# Patient Record
Sex: Female | Born: 1940 | Race: White | Hispanic: No | Smoking: Never smoker
Health system: Southern US, Community
[De-identification: ages and names within clinical notes are randomized; demographics above are authoritative.]

## PROBLEM LIST (undated history)

## (undated) DIAGNOSIS — K219 Gastro-esophageal reflux disease without esophagitis: Secondary | ICD-10-CM

## (undated) DIAGNOSIS — C801 Malignant (primary) neoplasm, unspecified: Secondary | ICD-10-CM

## (undated) DIAGNOSIS — Z7901 Long term (current) use of anticoagulants: Secondary | ICD-10-CM

## (undated) DIAGNOSIS — F419 Anxiety disorder, unspecified: Secondary | ICD-10-CM

## (undated) DIAGNOSIS — M8580 Other specified disorders of bone density and structure, unspecified site: Secondary | ICD-10-CM

## (undated) DIAGNOSIS — E785 Hyperlipidemia, unspecified: Secondary | ICD-10-CM

## (undated) DIAGNOSIS — I480 Paroxysmal atrial fibrillation: Secondary | ICD-10-CM

## (undated) DIAGNOSIS — I10 Essential (primary) hypertension: Secondary | ICD-10-CM

## (undated) DIAGNOSIS — C50919 Malignant neoplasm of unspecified site of unspecified female breast: Secondary | ICD-10-CM

## (undated) DIAGNOSIS — C50911 Malignant neoplasm of unspecified site of right female breast: Secondary | ICD-10-CM

## (undated) HISTORY — DX: Malignant neoplasm of unspecified site of right female breast: C50.911

## (undated) HISTORY — DX: Paroxysmal atrial fibrillation: I48.0

## (undated) HISTORY — PX: CHOLECYSTECTOMY: SHX55

## (undated) HISTORY — DX: Essential (primary) hypertension: I10

## (undated) HISTORY — DX: Hyperlipidemia, unspecified: E78.5

## (undated) HISTORY — DX: Other specified disorders of bone density and structure, unspecified site: M85.80

## (undated) HISTORY — DX: Long term (current) use of anticoagulants: Z79.01

## (undated) HISTORY — DX: Malignant neoplasm of unspecified site of unspecified female breast: C50.919

## (undated) HISTORY — PX: ABDOMINAL HYSTERECTOMY: SHX81

---

## 2000-11-26 ENCOUNTER — Encounter: Payer: Self-pay | Admitting: Family Medicine

## 2000-11-26 ENCOUNTER — Ambulatory Visit (HOSPITAL_COMMUNITY): Admission: RE | Admit: 2000-11-26 | Discharge: 2000-11-26 | Payer: Self-pay | Admitting: Family Medicine

## 2001-12-02 ENCOUNTER — Ambulatory Visit (HOSPITAL_COMMUNITY): Admission: RE | Admit: 2001-12-02 | Discharge: 2001-12-02 | Payer: Self-pay | Admitting: *Deleted

## 2001-12-02 ENCOUNTER — Encounter: Payer: Self-pay | Admitting: *Deleted

## 2001-12-03 ENCOUNTER — Encounter: Payer: Self-pay | Admitting: *Deleted

## 2002-03-10 ENCOUNTER — Ambulatory Visit (HOSPITAL_COMMUNITY): Admission: RE | Admit: 2002-03-10 | Discharge: 2002-03-10 | Payer: Self-pay | Admitting: Internal Medicine

## 2002-03-10 HISTORY — PX: ESOPHAGOGASTRODUODENOSCOPY: SHX1529

## 2002-03-10 HISTORY — PX: COLONOSCOPY: SHX174

## 2002-03-14 ENCOUNTER — Encounter: Payer: Self-pay | Admitting: Internal Medicine

## 2002-03-14 ENCOUNTER — Ambulatory Visit (HOSPITAL_COMMUNITY): Admission: RE | Admit: 2002-03-14 | Discharge: 2002-03-14 | Payer: Self-pay | Admitting: Internal Medicine

## 2002-08-13 ENCOUNTER — Encounter: Payer: Self-pay | Admitting: Family Medicine

## 2002-08-13 ENCOUNTER — Ambulatory Visit (HOSPITAL_COMMUNITY): Admission: RE | Admit: 2002-08-13 | Discharge: 2002-08-13 | Payer: Self-pay | Admitting: Family Medicine

## 2003-02-02 ENCOUNTER — Encounter: Payer: Self-pay | Admitting: Family Medicine

## 2003-02-02 ENCOUNTER — Ambulatory Visit (HOSPITAL_COMMUNITY): Admission: RE | Admit: 2003-02-02 | Discharge: 2003-02-02 | Payer: Self-pay | Admitting: Family Medicine

## 2004-02-05 ENCOUNTER — Ambulatory Visit (HOSPITAL_COMMUNITY): Admission: RE | Admit: 2004-02-05 | Discharge: 2004-02-05 | Payer: Self-pay | Admitting: Family Medicine

## 2005-06-08 ENCOUNTER — Emergency Department (HOSPITAL_COMMUNITY): Admission: EM | Admit: 2005-06-08 | Discharge: 2005-06-08 | Payer: Self-pay | Admitting: Emergency Medicine

## 2005-08-12 ENCOUNTER — Inpatient Hospital Stay (HOSPITAL_COMMUNITY): Admission: EM | Admit: 2005-08-12 | Discharge: 2005-08-14 | Payer: Self-pay | Admitting: Emergency Medicine

## 2005-11-26 ENCOUNTER — Ambulatory Visit (HOSPITAL_COMMUNITY): Admission: RE | Admit: 2005-11-26 | Discharge: 2005-11-26 | Payer: Self-pay | Admitting: Family Medicine

## 2006-11-30 ENCOUNTER — Ambulatory Visit (HOSPITAL_COMMUNITY): Admission: RE | Admit: 2006-11-30 | Discharge: 2006-11-30 | Payer: Self-pay | Admitting: Family Medicine

## 2007-04-15 ENCOUNTER — Emergency Department (HOSPITAL_COMMUNITY): Admission: EM | Admit: 2007-04-15 | Discharge: 2007-04-16 | Payer: Self-pay | Admitting: Emergency Medicine

## 2007-12-20 ENCOUNTER — Ambulatory Visit (HOSPITAL_COMMUNITY): Admission: RE | Admit: 2007-12-20 | Discharge: 2007-12-20 | Payer: Self-pay | Admitting: Family Medicine

## 2008-03-17 ENCOUNTER — Emergency Department (HOSPITAL_COMMUNITY): Admission: EM | Admit: 2008-03-17 | Discharge: 2008-03-17 | Payer: Self-pay | Admitting: Emergency Medicine

## 2008-12-18 ENCOUNTER — Ambulatory Visit (HOSPITAL_COMMUNITY): Admission: RE | Admit: 2008-12-18 | Discharge: 2008-12-18 | Payer: Self-pay | Admitting: Family Medicine

## 2008-12-20 ENCOUNTER — Ambulatory Visit (HOSPITAL_COMMUNITY): Admission: RE | Admit: 2008-12-20 | Discharge: 2008-12-20 | Payer: Self-pay | Admitting: Family Medicine

## 2009-02-26 ENCOUNTER — Encounter (INDEPENDENT_AMBULATORY_CARE_PROVIDER_SITE_OTHER): Payer: Self-pay | Admitting: *Deleted

## 2009-02-26 LAB — CONVERTED CEMR LAB
ALT: 23 units/L
AST: 25 units/L
Alkaline Phosphatase: 72 units/L
BUN: 14 mg/dL
CO2: 25 meq/L
Chloride: 105 meq/L
Cholesterol: 178 mg/dL
Creatinine, Ser: 0.77 mg/dL
Glucose, Bld: 90 mg/dL
HCT: 44.1 %
HDL: 57 mg/dL
Potassium: 4.6 meq/L
Sodium: 142 meq/L

## 2009-03-15 ENCOUNTER — Emergency Department (HOSPITAL_COMMUNITY): Admission: EM | Admit: 2009-03-15 | Discharge: 2009-03-15 | Payer: Self-pay | Admitting: Emergency Medicine

## 2009-08-07 ENCOUNTER — Encounter (INDEPENDENT_AMBULATORY_CARE_PROVIDER_SITE_OTHER): Payer: Self-pay | Admitting: *Deleted

## 2009-08-08 ENCOUNTER — Ambulatory Visit: Payer: Self-pay | Admitting: Cardiology

## 2009-08-15 ENCOUNTER — Ambulatory Visit: Payer: Self-pay | Admitting: Cardiology

## 2009-08-24 ENCOUNTER — Encounter: Payer: Self-pay | Admitting: Cardiology

## 2009-09-04 ENCOUNTER — Encounter: Payer: Self-pay | Admitting: Cardiology

## 2009-09-17 ENCOUNTER — Ambulatory Visit: Payer: Self-pay | Admitting: Cardiology

## 2009-10-24 ENCOUNTER — Encounter (INDEPENDENT_AMBULATORY_CARE_PROVIDER_SITE_OTHER): Payer: Self-pay

## 2009-10-24 ENCOUNTER — Ambulatory Visit: Payer: Self-pay | Admitting: Cardiology

## 2009-10-24 ENCOUNTER — Inpatient Hospital Stay (HOSPITAL_COMMUNITY): Admission: EM | Admit: 2009-10-24 | Discharge: 2009-10-26 | Payer: Self-pay | Admitting: Emergency Medicine

## 2009-10-24 LAB — CONVERTED CEMR LAB
BUN: 11 mg/dL
CO2: 24 meq/L
Chloride: 107 meq/L
Glomerular Filtration Rate, Af Am: >60 mL/min/{1.73_m2}
Glucose, Bld: 138 mg/dL
Hemoglobin: 14.2 g/dL
Platelets: 253 10*3/uL

## 2009-11-08 ENCOUNTER — Ambulatory Visit: Payer: Self-pay | Admitting: Cardiology

## 2009-11-08 ENCOUNTER — Encounter (HOSPITAL_COMMUNITY): Admission: RE | Admit: 2009-11-08 | Discharge: 2009-11-08 | Payer: Self-pay | Admitting: Cardiology

## 2009-11-19 ENCOUNTER — Encounter: Payer: Self-pay | Admitting: Adult Health

## 2009-11-19 ENCOUNTER — Ambulatory Visit: Payer: Self-pay | Admitting: Cardiology

## 2009-12-21 ENCOUNTER — Ambulatory Visit (HOSPITAL_COMMUNITY): Admission: RE | Admit: 2009-12-21 | Discharge: 2009-12-21 | Payer: Self-pay | Admitting: Family Medicine

## 2010-01-09 ENCOUNTER — Telehealth (INDEPENDENT_AMBULATORY_CARE_PROVIDER_SITE_OTHER): Payer: Self-pay | Admitting: *Deleted

## 2010-01-11 ENCOUNTER — Ambulatory Visit: Payer: Self-pay | Admitting: Cardiology

## 2010-01-28 ENCOUNTER — Encounter (INDEPENDENT_AMBULATORY_CARE_PROVIDER_SITE_OTHER): Payer: Self-pay | Admitting: *Deleted

## 2010-01-28 LAB — CONVERTED CEMR LAB
Albumin: 4.4 g/dL
Alkaline Phosphatase: 58 units/L
CO2: 26 meq/L
Calcium: 9.3 mg/dL
Cholesterol: 150 mg/dL
Glucose, Bld: 87 mg/dL
HDL: 63 mg/dL
LDL Cholesterol: 60 mg/dL
Triglycerides: 135 mg/dL

## 2010-01-29 ENCOUNTER — Encounter (INDEPENDENT_AMBULATORY_CARE_PROVIDER_SITE_OTHER): Payer: Self-pay | Admitting: *Deleted

## 2010-01-29 LAB — CONVERTED CEMR LAB
ALT: 20 units/L
AST: 23 units/L
Albumin: 4.4 g/dL
BUN: 13 mg/dL
CO2: 26 meq/L
Calcium: 9.3 mg/dL
Chloride: 107 meq/L
Creatinine, Ser: 0.91 mg/dL
LDL Cholesterol: 60 mg/dL
Potassium: 4.6 meq/L
Sodium: 143 meq/L
Triglycerides: 135 mg/dL

## 2010-02-07 ENCOUNTER — Encounter: Payer: Self-pay | Admitting: Cardiology

## 2010-02-15 ENCOUNTER — Encounter (INDEPENDENT_AMBULATORY_CARE_PROVIDER_SITE_OTHER): Payer: Self-pay | Admitting: *Deleted

## 2010-02-26 ENCOUNTER — Ambulatory Visit: Payer: Self-pay | Admitting: Cardiology

## 2010-02-26 ENCOUNTER — Encounter (INDEPENDENT_AMBULATORY_CARE_PROVIDER_SITE_OTHER): Payer: Self-pay | Admitting: *Deleted

## 2010-02-26 DIAGNOSIS — E785 Hyperlipidemia, unspecified: Secondary | ICD-10-CM | POA: Insufficient documentation

## 2010-03-05 ENCOUNTER — Encounter: Payer: Self-pay | Admitting: Cardiology

## 2010-03-06 ENCOUNTER — Ambulatory Visit: Payer: Self-pay | Admitting: Cardiology

## 2010-03-06 LAB — CONVERTED CEMR LAB: POC INR: 5.6

## 2010-03-11 ENCOUNTER — Ambulatory Visit: Payer: Self-pay | Admitting: Cardiology

## 2010-03-11 LAB — CONVERTED CEMR LAB: POC INR: 3.7

## 2010-03-18 ENCOUNTER — Ambulatory Visit: Payer: Self-pay | Admitting: Cardiology

## 2010-03-27 ENCOUNTER — Ambulatory Visit: Payer: Self-pay | Admitting: Cardiology

## 2010-03-27 LAB — CONVERTED CEMR LAB: POC INR: 2.7

## 2010-04-11 ENCOUNTER — Ambulatory Visit: Payer: Self-pay | Admitting: Cardiology

## 2010-04-11 LAB — CONVERTED CEMR LAB: POC INR: 3.1

## 2010-04-30 ENCOUNTER — Encounter (INDEPENDENT_AMBULATORY_CARE_PROVIDER_SITE_OTHER): Payer: Self-pay | Admitting: *Deleted

## 2010-04-30 ENCOUNTER — Encounter: Payer: Self-pay | Admitting: Cardiology

## 2010-04-30 ENCOUNTER — Ambulatory Visit
Admission: RE | Admit: 2010-04-30 | Discharge: 2010-04-30 | Payer: Self-pay | Source: Home / Self Care | Attending: Cardiology | Admitting: Cardiology

## 2010-04-30 LAB — CONVERTED CEMR LAB
ALT: 20 units/L (ref 0–35)
AST: 24 units/L (ref 0–37)
Albumin: 4.3 g/dL (ref 3.5–5.2)
Alkaline Phosphatase: 66 units/L (ref 39–117)
Basophils Relative: 1 % (ref 0–1)
CO2: 26 meq/L (ref 19–32)
Creatinine, Ser: 0.85 mg/dL (ref 0.40–1.20)
Glucose, Bld: 107 mg/dL — ABNORMAL HIGH (ref 70–99)
Lymphocytes Relative: 47 % — ABNORMAL HIGH (ref 12–46)
MCHC: 31.3 g/dL (ref 30.0–36.0)
Neutrophils Relative %: 39 % — ABNORMAL LOW (ref 43–77)
OCCULT 1: NEGATIVE
OCCULT 2: NEGATIVE
OCCULT 3: NEGATIVE
POC INR: 3.4
RBC: 4.68 M/uL (ref 3.87–5.11)
Sodium: 139 meq/L (ref 135–145)
Total Protein: 7 g/dL (ref 6.0–8.3)
WBC: 7.8 10*3/uL (ref 4.0–10.5)

## 2010-05-08 LAB — CONVERTED CEMR LAB: OCCULT 3: NEGATIVE

## 2010-05-13 ENCOUNTER — Ambulatory Visit
Admission: RE | Admit: 2010-05-13 | Discharge: 2010-05-13 | Payer: Self-pay | Source: Home / Self Care | Attending: Cardiology | Admitting: Cardiology

## 2010-05-13 LAB — CONVERTED CEMR LAB: POC INR: 2.7

## 2010-05-17 ENCOUNTER — Encounter (INDEPENDENT_AMBULATORY_CARE_PROVIDER_SITE_OTHER): Payer: Self-pay | Admitting: *Deleted

## 2010-05-18 ENCOUNTER — Encounter: Payer: Self-pay | Admitting: Family Medicine

## 2010-05-24 ENCOUNTER — Telehealth (INDEPENDENT_AMBULATORY_CARE_PROVIDER_SITE_OTHER): Payer: Self-pay | Admitting: *Deleted

## 2010-05-28 NOTE — Letter (Signed)
Summary: rhythm strips dr Sudie Bailey  rhythm strips dr Sudie Bailey   Imported By: Faythe Ghee 08/08/2009 15:11:18  _____________________________________________________________________  External Attachment:    Type:   Image     Comment:   External Document

## 2010-05-28 NOTE — Assessment & Plan Note (Signed)
Summary: **PER DR.KNOWLTON FOR NEW ONSET A-FIB/TG   Visit Type:  Initial Consult Primary Provider:  Dr.Stephen Sudie Bailey   History of Present Illness: It was my pleasure evaluating [name] , a pleasant 70 year old woman with a history of paroxysmal atrial fibrillation.  Patient originally presented approximately 2 years ago with AF and a rapid ventricular response.  She was hospitalized and initially treated by [facility].  Records of those encounters have been requested.  She apparently was found not to have structural heart disease that was not thought to need anticoagulation.  She subsequently did well until seen by Dr. Sudie Bailey in the office a few days ago at which time atrial fibrillation was once again present.  Patient is somewhat unclear as to whether she was symptomatic.  She believes she may have had some dyspnea that morning and some malaise.  She did not have palpitations or chest discomfort.  Office records obtained from Dr. Sudie Bailey and reviewed.  A CBC, chemistry profile and lipid profile were excellent 4 months ago.  Current Medications (verified): 1)  Fexofenadine Hcl 180 Mg Tabs (Fexofenadine Hcl) .... Take 1 Tab Daily 2)  Losartan Potassium 50 Mg Tabs (Losartan Potassium) .... Take 1 Tab Daily 3)  Atenolol 100 Mg Tabs (Atenolol) .... Take 1 Tab Daily 4)  Premarin 0.3 Mg Tabs (Estrogens Conjugated) .... Take 1 Tab Three Times Weekly 5)  Aspir-Low 81 Mg Tbec (Aspirin) .... Take 1 Tab Three Times Weekly 6)  Prilosec 40 Mg Cpdr (Omeprazole) .... Take 1 Tab Daily 7)  Simvastatin 40 Mg Tabs (Simvastatin) .... Take 1 Tab Daily  Allergies (verified): 1)  ! * Sulfar 2)  ! * Iv Dye  Past History:  Family History: Last updated: Sep 02, 2009 Father died at age 12 as the result of an acute myocardial infarction Mother died at age 19, also with cardiac problems. 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   Review of Systems       Notable for intermittent headaches, the need for corrective lenses, partial upper and lower dentures, gastroesophageal reflux disease symptoms, urinary frequency.  All other systems reviewed and are negative.  Vital Signs:  Patient profile:   70 year old female Height:      61 inches Weight:      152 pounds BMI:     28.82 Pulse rate:   61 / minute BP sitting:   147 / 83  (right arm)  Vitals Entered By: Dreama Saa, CNA (02-Sep-2009 10:59 AM)  Physical Exam  General:  Well-developed; Somewhat overweight; no acute distress: HEENT-Decker/AT;  PERRL; EOM intact; conjunctiva and lids nl:  Neck-No JVD; no carotid bruits: Endocrine-No thyromegaly: Lungs-No tachypnea, clear without rales, rhonchi or wheezes: CV-normal PMI; normal S1 and S2; prominent S4 Abdomen-BS normal; soft and non-tender without masses or organomegaly: MS-No deformities, cyanosis or clubbing: Neurologic-Nl cranial nerves; symmetric strength and tone: Skin- Warm,  no sig. lesions: Extremities-Nl distal pulses; no edema    Impression & Recommendations:  Problem # 1:  FIBRILLATION, ATRIAL (ICD-427.31) From a single episode of recurrent atrial fibrillation of uncertain duration, it is not possible to assess the prevalence of her arrhythmia nor whether it is resulting in symptoms.  Accordingly, she will carry an event recorder for the next 3 weeks to permit identification of the frequency of episodes, their duration and hopefully correlate her sense of well-being with these spells.  [name] risk for thromboembolism is intermediate based upon her age, gender and the presence of mild well-controlled hypertension.  Most likely, she will require anticoagulation, but this decision can be better made with additional data is available.  I will reassess this nice woman in one month.  Patient Instructions: 1)  Your physician recommends that you schedule a follow-up appointment in: 1 month 2)  Your physician has recommended that you wear an event monitor.  Event monitors are medical devices that record the heart's electrical activity. Doctors most often use these monitors to diagnose arrhythmias. Arrhythmias are problems with the speed or rhythm of the heartbeat. The monitor is a small, portable device. You can wear one while you do your normal daily activities. This is usually used to diagnose what is causing palpitations/syncope (passing out).

## 2010-05-28 NOTE — Letter (Signed)
Summary: [facility] [employer], [employer] at [employer]  [address] [place] [address]   Phone: [phone]  Fax: [phone]     February 26, 2010 MRN: [id number]   Stacie Huang [address] , [place]  [address]      YOUR LAB WORK IS DUE   April 29, 2010  Please go to [facility], located across the street from [facility] on the second floor.  Hours are Monday - Friday 7am until 7:30pm         Saturday 8am until 12noon    __  DO NOT EAT OR DRINK AFTER MIDNIGHT EVENING PRIOR TO LABWORK  _X_ YOUR LABWORK IS NOT FASTING --YOU MAY EAT PRIOR TO LABWORK

## 2010-05-28 NOTE — Progress Notes (Signed)
Summary: Stacie Huang SOB AND WEAK  Phone Note Call from Patient Call back at Home Phone [phone]   Caller: Stacie Huang Reason for Call: Talk to Nurse Summary of Call: Stacie Huang WAS PUT ON NEW RX DILTIAZEM AND SHE THINKS IT MAY BE CAUSING HER TO BE SOB AND HAS NOT FELT GOOD FOR ABOUT TWO  MONTHS KEEPS GETTING WORSE. SHE IS WEAK ALL THE TIME. Initial call taken by: Faythe Ghee,  January 09, 2010 9:41 AM  Follow-up for Phone Call        [facility] Follow-up by: Teressa Lower RN,  January 09, 2010 1:58 PM    Additional Follow-up for Phone Call Additional follow up Details #2::    Stacie Huang returned call, feeling tired, listless, on diltiazem for atrial fib, asked Stacie Huang to schedule a nurse visit for further eval and ekg on Friday 01/14/2010 Follow-up by: Teressa Lower RN,  January 09, 2010 2:06 PM

## 2010-05-28 NOTE — Letter (Signed)
Summary: progress note dr Sudie Bailey  progress note dr Sudie Bailey   Imported By: Faythe Ghee 08/08/2009 15:10:52  _____________________________________________________________________  External Attachment:    Type:   Image     Comment:   External Document

## 2010-05-28 NOTE — Miscellaneous (Signed)
Summary: cmp,lipids per Dr. Sudie Bailey  Clinical Lists Changes  Observations: Added new observation of CALCIUM: 9.3 mg/dL (16/01/9603 54:09) Added new observation of ALBUMIN: 4.4 g/dL (81/19/1478 29:56) Added new observation of PROTEIN, TOT: 6.9 g/dL (21/30/8657 84:69) Added new observation of SGPT (ALT): 20 units/L (01/29/2010 11:55) Added new observation of SGOT (AST): 23 units/L (01/29/2010 11:55) Added new observation of ALK PHOS: 58 units/L (01/29/2010 11:55) Added new observation of BILI DIRECT: total bili    0.9 mg/dL (62/95/2841 32:44) Added new observation of CREATININE: 0.91 mg/dL (04/30/7251 66:44) Added new observation of BUN: 13 mg/dL (03/47/4259 56:38) Added new observation of BG RANDOM: 87 mg/dL (75/64/3329 51:88) Added new observation of CO2 PLSM/SER: 26 meq/L (01/29/2010 11:55) Added new observation of CL SERUM: 107 meq/L (01/29/2010 11:55) Added new observation of K SERUM: 4.6 meq/L (01/29/2010 11:55) Added new observation of NA: 143 meq/L (01/29/2010 11:55) Added new observation of LDL: 60 mg/dL (41/66/0630 16:01) Added new observation of HDL: 63 mg/dL (09/32/3557 32:20) Added new observation of TRIGLYC TOT: 135 mg/dL (25/42/7062 37:62) Added new observation of CHOLESTEROL: 150 mg/dL (83/15/1761 60:73)

## 2010-05-28 NOTE — Medication Information (Signed)
Summary: ccr-lr  Anticoagulant Therapy  Managed by: Vashti Hey, RN PCP: Dr.Stephen Orvan Falconer MD: Dietrich Pates MD, Molly Maduro Indication 1: Atrial Fibrillation Lab Used: [facility] Point of Care [place] Site: [place] INR POC 3.7  Dietary changes: no    Health status changes: no    Bleeding/hemorrhagic complications: no    Recent/future hospitalizations: no    Any changes in medication regimen? no    Recent/future dental: no  Any missed doses?: no       Is patient compliant with meds? yes       Allergies: 1)  ! * Sulfar 2)  ! * Iv Dye  Anticoagulation Management History:      The patient is taking warfarin and comes in today for a routine follow up visit.  Warfarin therapy is being given due to Atrial Fibrillation .  Positive risk factors for bleeding include an age of 71 years or older.  Negative risk factors for bleeding include no history of CVA/TIA, no history of GI bleeding, and absence of serious comorbidities.  The bleeding index is 'intermediate risk'.  Positive CHADS2 values include History of HTN.  Negative CHADS2 values include History of CHF, Age > 71 years old, History of Diabetes, and Prior Stroke/CVA/TIA.  The start date was 02/26/2010.  Anticoagulation responsible Shanta Hartner: Dietrich Pates MD, Molly Maduro.  INR POC: 3.7.  Cuvette Lot#: [id number].    Anticoagulation Management Assessment/Plan:      The patient's current anticoagulation dose is Warfarin sodium 5 mg tabs: take 1 tablet on MWFand Saturday and 1/2 tablet all other days.  The target INR is 2.0-3.0.  The next INR is due 03/18/2010.  Anticoagulation instructions were given to patient.  Results were reviewed/authorized by Vashti Hey, RN.  She was notified by Vashti Hey RN.         Prior Anticoagulation Instructions: INR 5.6 Hold couamdin tonight and tomorrow then decrease dose to 2.5mg  once daily   Current Anticoagulation Instructions: INR 3.7 Decrease coumadin dose to 1/2 tablet once daily except none on  Mondays and Thursdays

## 2010-05-28 NOTE — Miscellaneous (Signed)
**Note De-Identified Stacie Huang Obfuscation** Summary: CBC, CMP and cardiac markers  Clinical Lists Changes  Observations: Added new observation of TROPONIN I: 0.02 ng/mL (10/24/2009 13:58) Added new observation of CK-MB ISOENZ: 1.1 ng/mL (10/24/2009 13:58) Added new observation of CALCIUM: 9.3 mg/dL (95/63/8756 43:32) Added new observation of ALBUMIN: 4.2 g/dL (95/18/8416 60:63) Added new observation of PROTEIN, TOT: 7.5 g/dL (01/60/1093 23:55) Added new observation of SGPT (ALT): 41 units/L (10/24/2009 13:58) Added new observation of SGOT (AST): 43 units/L (10/24/2009 13:58) Added new observation of ALK PHOS: 73 units/L (10/24/2009 13:58) Added new observation of BILI DIRECT: Bili Total: 0.6 mg/dL (73/22/0254 27:06) Added new observation of GFR AA: >60 mL/min/1.22m2 (10/24/2009 13:58) Added new observation of GFR: >60 mL/min (10/24/2009 13:58) Added new observation of CREATININE: 0.77 mg/dL (23/76/2831 51:76) Added new observation of BUN: 11 mg/dL (16/10/3708 62:69) Added new observation of BG RANDOM: 138 mg/dL (48/54/6270 35:00) Added new observation of CO2 PLSM/SER: 24 meq/L (10/24/2009 13:58) Added new observation of CL SERUM: 107 meq/L (10/24/2009 13:58) Added new observation of K SERUM: 3.5 meq/L (10/24/2009 13:58) Added new observation of NA: 139 meq/L (10/24/2009 13:58) Added new observation of PLATELETK/UL: 253 K/uL (10/24/2009 13:58) Added new observation of MCV: 92.2 fL (10/24/2009 13:58) Added new observation of HCT: 42.9 % (10/24/2009 13:58) Added new observation of HGB: 14.2 g/dL (93/81/8299 37:16) Added new observation of WBC COUNT: 10.6 10*3/microliter (10/24/2009 13:58) Added new observation of INR POC: 0.96  (10/24/2009 13:58) Added new observation of PT PATIENT: 12.7 s (10/24/2009 13:58)

## 2010-05-28 NOTE — Letter (Signed)
Summary: progress note  dr Sudie Bailey  progress note  dr Sudie Bailey   Imported By: Faythe Ghee 08/24/2009 14:36:08  _____________________________________________________________________  External Attachment:    Type:   Image     Comment:   External Document

## 2010-05-28 NOTE — Miscellaneous (Signed)
Summary: labs cmp,lipids,01/28/2010  Clinical Lists Changes  Observations: Added new observation of CALCIUM: 9.3 mg/dL (16/01/9603 54:09) Added new observation of ALBUMIN: 4.4 g/dL (81/19/1478 29:56) Added new observation of PROTEIN, TOT: 6.9 g/dL (21/30/8657 84:69) Added new observation of SGPT (ALT): 20 units/L (01/28/2010 11:30) Added new observation of SGOT (AST): 23 units/L (01/28/2010 11:30) Added new observation of ALK PHOS: 58 units/L (01/28/2010 11:30) Added new observation of CREATININE: 0.91 mg/dL (62/95/2841 32:44) Added new observation of BUN: 13 mg/dL (04/30/7251 66:44) Added new observation of BG RANDOM: 87 mg/dL (03/47/4259 56:38) Added new observation of CO2 PLSM/SER: 26 meq/L (01/28/2010 11:30) Added new observation of CL SERUM: 107 meq/L (01/28/2010 11:30) Added new observation of K SERUM: 4.6 meq/L (01/28/2010 11:30) Added new observation of NA: 143 meq/L (01/28/2010 11:30) Added new observation of LDL: 60 mg/dL (75/64/3329 51:88) Added new observation of HDL: 63 mg/dL (41/66/0630 16:01) Added new observation of TRIGLYC TOT: 135 mg/dL (09/32/3557 32:20) Added new observation of CHOLESTEROL: 150 mg/dL (25/42/7062 37:62)

## 2010-05-28 NOTE — Letter (Signed)
Summary: DR Sudie Bailey OFFICE NOTE 01/28/10  DR Sudie Bailey OFFICE NOTE 01/28/10   Imported By: Faythe Ghee 02/07/2010 14:04:42  _____________________________________________________________________  External Attachment:    Type:   Image     Comment:   External Document

## 2010-05-28 NOTE — Miscellaneous (Signed)
Summary: LABS CBCD,CMP,LIPID,02/26/2009  Clinical Lists Changes  Observations: Added new observation of CALCIUM: 9.6 mg/dL (81/19/1478 29:56) Added new observation of ALBUMIN: 4.3 g/dL (21/30/8657 84:69) Added new observation of PROTEIN, TOT: 7.0 g/dL (62/95/2841 32:44) Added new observation of SGPT (ALT): 23 units/L (02/26/2009 10:22) Added new observation of SGOT (AST): 25 units/L (02/26/2009 10:22) Added new observation of ALK PHOS: 72 units/L (02/26/2009 10:22) Added new observation of CREATININE: 0.77 mg/dL (04/30/7251 66:44) Added new observation of BUN: 14 mg/dL (03/47/4259 56:38) Added new observation of BG RANDOM: 90 mg/dL (75/64/3329 51:88) Added new observation of CO2 PLSM/SER: 25 meq/L (02/26/2009 10:22) Added new observation of CL SERUM: 105 meq/L (02/26/2009 10:22) Added new observation of K SERUM: 4.6 meq/L (02/26/2009 10:22) Added new observation of NA: 142 meq/L (02/26/2009 10:22) Added new observation of LDL: 89 mg/dL (41/66/0630 16:01) Added new observation of HDL: 57 mg/dL (09/32/3557 32:20) Added new observation of TRIGLYC TOT: 161 mg/dL (25/42/7062 37:62) Added new observation of CHOLESTEROL: 178 mg/dL (83/15/1761 60:73) Added new observation of PLATELETK/UL: 284 K/uL (02/26/2009 10:22) Added new observation of MCV: 92.1 fL (02/26/2009 10:22) Added new observation of HCT: 44.1 % (02/26/2009 10:22) Added new observation of HGB: 13.6 g/dL (71/09/2692 85:46) Added new observation of WBC COUNT: 8.1 10*3/microliter (02/26/2009 10:22)

## 2010-05-28 NOTE — Assessment & Plan Note (Signed)
Summary: F1M   Visit Type:  Follow-up Primary Provider:  Dr.Stephen Sudie Bailey   History of Present Illness: [name] returns to the office as scheduled for continued assessment and treatment of paroxysmal atrial fibrillation.  In retrospect, she believes that she was able to sense mild malaise in association with her arrhythmia.  She has had no similar symptoms over the past month.  An event recorder verified that no atrial arrhythmias occurred.  Event Monitor  Procedure date:  08/26/2009  Findings:      Normal sinus rhythm with sinus bradycardia and PVCs No symptoms reported Note atrial fibrillation identified   Current Medications (verified): 1)  Fexofenadine Hcl 180 Mg Tabs (Fexofenadine Hcl) .... Take 1 Tab Daily 2)  Losartan Potassium 50 Mg Tabs (Losartan Potassium) .... Take 1 Tab Daily 3)  Atenolol 100 Mg Tabs (Atenolol) .... Take 1 Tab Daily 4)  Premarin 0.3 Mg Tabs (Estrogens Conjugated) .... Take 1 Tab Three Times Weekly 5)  Aspir-Low 81 Mg Tbec (Aspirin) .... Take 1 Tab Three Times Weekly 6)  Prilosec 40 Mg Cpdr (Omeprazole) .... Take 1 Tab Daily 7)  Simvastatin 40 Mg Tabs (Simvastatin) .... Take 1 Tab Daily  Allergies (verified): 1)  ! * Sulfar 2)  ! * Iv Dye  Past History:  PMH, FH, and Social History reviewed and updated.  Review of Systems       See history of present illness.  Vital Signs:  Patient profile:   70 year old female Weight:      154 pounds Pulse rate:   57 / minute BP sitting:   132 / 63  (right arm)  Vitals Entered By: Dreama Saa, CNA (Sep 17, 2009 1:05 PM)  Physical Exam  General:  Well-developed; Somewhat overweight; no acute distress: Neck-No JVD; no carotid bruits: Lungs-No tachypnea, clear without rales, rhonchi or wheezes: CV-normal PMI; normal S1 and S2;  S4 and modest systolic murmur present Abdomen-BS normal; soft and non-tender without masses or organomegaly: MS-No deformities, cyanosis or clubbing: Neurologic-Nl  cranial nerves; symmetric strength and tone: Skin- Warm, no sig. lesions: Extremities-Nl distal pulses; no edema    Impression & Recommendations:  Problem # 1:  FIBRILLATION, ATRIAL (ICD-427.31) In light of intermediate age, female gender and mild well-controlled hypertension, risk of thromboembolism is low to moderate.  Since only one fairly brief spell of atrial fibrillation has been diagnosed in recent months, I believe that the risks of anticoagulation would exceed the benefit at the present time.  As Ms. Teed ages, frequency of atrial fibrillation may well increase, which would justify full anticoagulation.  I will continue to follow this nice woman and will plan to see her again in the office in 7 months.  Problem # 2:  POSTMENOPAUSAL ON HORMONE REPLACEMENT THERAPY (ICD-V07.4) Patient continues to use estrogen, but has substantially reduced the dosage.  Even with this supplement, she experiences significant hot flashes on occasion and does not wish to discontinue estrogen entirely.  Patient Instructions: 1)  Your physician recommends that you schedule a follow-up appointment in: 8 months 2)  Your physician recommends that you continue on your current medications as directed. Please refer to the Current Medication list given to you today.

## 2010-05-28 NOTE — Assessment & Plan Note (Signed)
Summary: POST HOSP [place] PER 300/TG   Visit Type:  Follow-up Primary Provider:  Dr.Stephen Sudie Bailey  CC:  no cardiology complaints.  History of Present Illness: [name] is a pleasant 70 y/o CF with known history of PAF.  She was recently admitted to [facility] for AFib- RVR and placed on cardiazem gtt.  She converted to NSR and is now on Cardiazem 120mg  daily, along with atenolol.  She states she has been feeling welll without complaints of fatigue or DOE, she denies swelling or recurrence of palpatations.    Preventive Screening-Counseling & Management  Alcohol-Tobacco     Smoking Status: never  Current Medications (verified): 1)  Losartan Potassium 50 Mg Tabs (Losartan Potassium) .... Take 1 Tab Daily 2)  Atenolol 100 Mg Tabs (Atenolol) .... Take 1 Tab Daily 3)  Premarin 0.3 Mg Tabs (Estrogens Conjugated) .... Take 1 Tab Three Times Weekly 4)  Aspir-Low 81 Mg Tbec (Aspirin) .... Take 1 Tab Three Times Weekly 5)  Prilosec 40 Mg Cpdr (Omeprazole) .... Take 1 Tab Daily 6)  Simvastatin 40 Mg Tabs (Simvastatin) .... Take 1 Tab Daily 7)  Diltiazem Hcl Er Beads 120 Mg Xr24h-Cap (Diltiazem Hcl Er Beads) .... Take 1 Tab Daily 8)  Clobetasol Propionate 0.05 % Crea (Clobetasol Propionate) .... Use As Needed  Allergies (verified): 1)  ! * Sulfar 2)  ! * Iv Dye  Past History:  Past medical, surgical, family and social histories (including risk factors) reviewed, and no changes noted (except as noted below).  Past Medical History: Reviewed history from 08/08/2009 and no changes required. Paroxysmal atrial fibrillation Hypertension  Past Surgical History: Reviewed history from 08/08/2009 and no changes required. Hysterectomy Cholecystectomy  Family History: Reviewed history from 08/08/2009 and no changes required. Father died at age 10 as the result of an acute myocardial infarction Mother died at age 69, also with cardiac problems. Siblings: Of 9, 5 have or had heart disease and  2 have died due to neoplastic disease  Social History: Reviewed history from 08/08/2009 and no changes required. 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   Vital Signs:  Patient profile:   70 year old female Weight:      157 pounds Pulse rate:   60 / minute BP sitting:   113 / 58  (right arm)  Vitals Entered By: Dreama Saa, CNA (November 19, 2009 2:04 PM)  Physical Exam  General:  Well developed, well nourished, in no acute distress. Lungs:  Clear bilaterally to auscultation and percussion. Heart:  Non-displaced PMI, chest non-tender; regular rate and rhythm, S1, S2 without murmurs, rubs or gallops. Carotid upstroke normal, no bruit. Normal abdominal aortic size, no bruits. Femorals normal pulses, no bruits. Pedals normal pulses. No edema, no varicosities. Abdomen:  Bowel sounds positive; abdomen soft and non-tender without masses, organomegaly, or hernias noted. No hepatosplenomegaly. Extremities:  No clubbing or cyanosis. Neurologic:  Alert and oriented x 3. Psych:  Normal affect.   EKG  Procedure date:  11/19/2009  Findings:      Sinus bradycardia with rate of:58bpm    Impression & Recommendations:  Problem # 1:  FIBRILLATION, ATRIAL (ICD-427.31) Currently remains in NSR-SB on medications listed.  She denies having any recurrance of palpations at this time.  She had a nuclear medicine stress myoview  dated 11/08/09 which demonstrated deconditoning with elevated HR, but no perfusion abnormalities.  I have advised her to begin an exercise program of walking to allow for better stamina and improve conditioning.  She verbalizes understanding and plans to do so.  We will see her again in 3 months. Her updated medication list for this problem includes:    Atenolol  100 Mg Tabs (Atenolol) .Marland Kitchen... Take 1 tab daily    Aspir-low 81 Mg Tbec (Aspirin) .Marland Kitchen... Take 1 tab three times weekly  Patient Instructions: 1)  Your physician recommends that you schedule a follow-up appointment in: 3 months 2)  Your physician recommends that you continue on your current medications as directed. Please refer to the Current Medication list given to you today.   Prevention & Chronic Care Immunizations   Influenza vaccine: Not documented    Tetanus booster: Not documented    Pneumococcal vaccine: Not documented    H. zoster vaccine: Not documented  Colorectal Screening   Hemoccult: Not documented    Colonoscopy: Not documented  Other Screening   Pap smear: Not documented    Mammogram: Not documented    DXA bone density scan: Not documented   Smoking status: never  (11/19/2009)  Lipids   Total Cholesterol: 178  (02/26/2009)   LDL: 89  (02/26/2009)   LDL Direct: Not documented   HDL: 57  (02/26/2009)   Triglycerides: 161  (02/26/2009)

## 2010-05-28 NOTE — Assessment & Plan Note (Signed)
**Note De-Identified Sean Malinowski Obfuscation** Summary: NURSE VISIT PER TAMMY/TG  Nurse Visit   Vital Signs:  Patient profile:   70 year old female Weight:      162 pounds O2 Sat:      96 % on Room air Pulse rate:   82 / minute BP sitting:   119 / 71  (left arm)  Vitals Entered By: [name] Mckenley Birenbaum LPN (January 11, 2010 9:22 AM)  O2 Flow:  Room air   Visit Type:  Nurse visit/EKG Primary Provider:  Dr.Stephen Sudie Bailey   History of Present Illness: S: Pt. called yesterday with c/o SOB, fatigue and weakness. B: Pt. was discharged from [facility] on 10-26-09 on Diltiazem 120mg  once daily. A: Pt. c/o SOB, fatigue and weakness since starting Diltiazem and has been getting worse over the past 3-4 weeks. BP=119/71 and EKG obtained at this visit. R: Will call pt. with Dr. Marvel Plan recommendations.   01/14/10  Patient is scheduled for reassessment on November 1.  If she is unable to take diltiazem until that time, she may discontinue.  Page Bing, M.D.   [facility].      [name] Jaramiah Bossard LPN  January 14, 2010 1:00 PM   Pt. advised. She states she will try to continue to take Diltiazem until Nov. 1 appt. with Dr. Dietrich Pates.   Current Medications (verified): 1)  Losartan Potassium 50 Mg Tabs (Losartan Potassium) .... Take 1 Tab Daily 2)  Atenolol 100 Mg Tabs (Atenolol) .... Take 1 Tab Daily 3)  Premarin 0.3 Mg Tabs (Estrogens Conjugated) .... Take 1 Tablet By Mouth Once Daily 4)  Aspir-Low 81 Mg Tbec (Aspirin) .... Take 1 Tab Three Times Weekly 5)  Prilosec 40 Mg Cpdr (Omeprazole) .... Take 1 Tab Daily 6)  Simvastatin 40 Mg Tabs (Simvastatin) .... Take 1 Tab Daily 7)  Diltiazem Hcl Er Beads 120 Mg Xr24h-Cap (Diltiazem Hcl Er Beads) .... Take 1 Tab Daily 8)  Clobetasol Propionate 0.05 % Crea (Clobetasol Propionate) .... Use As Needed 9)  Vitamin D 400 Unit Caps (Cholecalciferol) .Marland Kitchen.. 1 Tablet By Mouth Once Daily 10)  Calcium 600 1500 Mg Tabs (Calcium Carbonate) .... Take 1 Tablet By Mouth Once Daily  Allergies (verified): 1)  ! *  Sulfar 2)  ! * Iv Dye

## 2010-05-28 NOTE — Assessment & Plan Note (Signed)
Summary: 3 mth f/u per checkout on 11/19/09/tg   Primary Provider:  Dr.Stephen Sudie Bailey   History of Present Illness: [name] returns to the office for continued assessment and treatment of atrial fibrillation.  Although her arrhythmia reverted to sinus rhythm in hospital, recurrent AF has been documented during her office visits.  She remains asymptomatic.  She experienced increased fatigue after initial adjustment of her medical therapy, but this resolved after her dose of atenolol was halved.  She also may have had myalgias prompting a reduction in her dose of simvastatin.  Alternatively, this may have been done because she is receiving concomitant therapy with diltiazem.  At the present time, she feels fairly good with some dyspnea on exertion.  A recent stress test showed physical deconditioning without myocardial ischemia on nuclear imaging.  There was no oxygen desaturation or marked increase in heart rate at low level exercise.  Hospital records from [facility] were obtained and reviewed.  A carotid ultrasound study performed in 2003 revealed no significant vascular disease as was the case for a CT scan of the abdomen in 2009.   Current Medications (verified): 1)  Losartan Potassium 50 Mg Tabs (Losartan Potassium) .... Take 1 Tab Daily 2)  Atenolol 100 Mg Tabs (Atenolol) .... Take 1/2 Tab Daily 3)  Premarin 0.3 Mg Tabs (Estrogens Conjugated) .... Take 1 Tablet By Mouth Once Daily 4)  Prilosec 40 Mg Cpdr (Omeprazole) .... Take 1 Tab Daily 5)  Simvastatin 10 Mg Tabs (Simvastatin) .... Take 1 Tablet By Mouth At Bedtime 6)  Diltiazem Hcl Er Beads 120 Mg Xr24h-Cap (Diltiazem Hcl Er Beads) .... Take 1 Tablet By Mouth Once A Day 7)  Clobetasol Propionate 0.05 % Crea (Clobetasol Propionate) .... Use As Needed 8)  Vitamin D 400 Unit Caps (Cholecalciferol) .Marland Kitchen.. 1 Tablet By Mouth Once Daily 9)  Calcium 600 1500 Mg Tabs (Calcium Carbonate) .... Take 1 Tablet By Mouth Once Daily 10)  Warfarin  Sodium 5 Mg Tabs (Warfarin Sodium) .... Take 1 Tablet On Mwfand Saturday and 1/2 Tablet All Other Days  Allergies (verified): 1)  ! * Sulfar 2)  ! * Iv Dye  Past History:  PMH, FH, and Social History reviewed and updated.  Review of Systems  The patient denies weight loss, weight gain, hoarseness, chest pain, syncope, peripheral edema, prolonged cough, headaches, hemoptysis, abdominal pain, and melena.    Vital Signs:  Patient profile:   70 year old female Weight:      159 pounds O2 Sat:      97 % Pulse rate:   73 / minute BP sitting:   117 / 71  (left arm)  Vitals Entered By: Stacie Huang Via LPN (February 26, 2010 1:06 PM)  Physical Exam  General:  Well developed;no acute distress; mildly overweight Neck-No JVD; no carotid bruits: Lungs-No tachypnea, no rales; no rhonchi; no wheezes: Cardiovascular-irregular rhythm;normal PMI; normal S1 and S2: Abdomen-BS normal; soft and non-tender without masses or organomegaly:  Musculoskeletal-No deformities, no cyanosis or clubbing: Neurologic-Normal cranial nerves; symmetric strength and tone:  Skin-Warm, no significant lesions: Extremities-Nl distal pulses; no edema:     Impression & Recommendations:  Problem # 1:  FIBRILLATION, ATRIAL (ICD-427.31) Heart rate has been well controlled, both at this visit and at her stress test.  Since her arrhythmia is paroxysmal, there is no point in performing cardioversion.  No benefit has been shown to treatment with antiarrhythmic drugs.  Accordingly, we will continue her current medication.  Problem # 2:  ANTICOAGULATION (ICD-V58.61) Her Chads score is 2, but she is female and approaching age 75, both of which convey additional risk.  Accordingly, aspirin will be discontinued and warfarin started.  We will provide teaching regarding anticoagulation and followup in our anticoagulation clinic.  Baseline stool for Hemoccult testing will be obtained.  Patient reports a colonoscopy that was negative  approximately 6 years ago.  Problem # 3:  HYPERTENSION (ICD-401.1) Blood pressure control has been excellent, but is only marginal today; hypertension is likely mild.  Patient will collect additional measurements at home and return for reassessment.  Other Orders: Hemoccult Cards (Take Home) (Hemoccult Cards) Future Orders: T-CBC w/Diff ([id number]) ... 04/29/2010 T-Comprehensive Metabolic Panel [id number] [phone]) ... 04/29/2010  Patient Instructions: 1)  Your physician recommends that you schedule a follow-up appointment in: 2 months 2)  Your physician has recommended you make the following change in your medication: stop aspirin, warfarin 5mg  tablets take 1 tablet on MWF and Saturday and 1/2 tablet all other days,  3)  Your physician has asked that you test your stool for blood. It is necessary to test 3 different stool specimens for accuracy. You will be given 3 hemoccult cards for specimen collection. For each stool specimen, place a small portion of stool sample (from 2 different areas of the stool) into the 2 squares on the card. Close card. Repeat with 2 more stool specimens. Bring the cards back to the office for testing. Prescriptions: WARFARIN SODIUM 5 MG TABS (WARFARIN SODIUM) take 1 tablet on MWFand Saturday and 1/2 tablet all other days  #45 x 3   Entered by:   Teressa Lower RN   Authorized by:   Kathlen Brunswick, MD, [facility]   Signed by:   Teressa Lower RN on 02/26/2010   Method used:   Electronically to        [facility]* (retail)       [address]       [place], [place]  [address]       Ph: [phone]       Fax: [phone]   RxID:   [id number] DILTIAZEM HCL ER BEADS 120 MG XR24H-CAP (DILTIAZEM HCL ER BEADS) Take 1 tablet by mouth once a day  #30 x 6   Entered by:   Teressa Lower RN   Authorized by:   Kathlen Brunswick, MD, [facility]   Signed by:   Teressa Lower RN on 02/26/2010   Method used:   Electronically to        [facility]* (retail)       [address]       [place], [place]  [address]       Ph: [phone]       Fax: [phone]   RxID:   [id number]   Prevention & Chronic Care Immunizations   Influenza vaccine: Not documented    Tetanus booster: Not documented    Pneumococcal vaccine: Not documented    H. zoster vaccine: Not documented  Colorectal Screening   Hemoccult: Not documented    Colonoscopy: Not documented  Other Screening   Pap smear: Not documented    Mammogram: Not documented    DXA bone density scan: Not documented   Smoking status: never  (11/19/2009)  Lipids   Total Cholesterol: 150  (01/29/2010)   LDL: 60  (01/29/2010)   LDL Direct: Not documented   HDL: 63  (  01/29/2010)   Triglycerides: 135  (01/29/2010)    SGOT (AST): 23  (01/29/2010)   SGPT (ALT): 20  (01/29/2010) CMP ordered    Alkaline phosphatase: 58  (01/29/2010)   Total bilirubin: Not documented  Hypertension   Last Blood Pressure: 117 / 71  (02/26/2010)   Serum creatinine: 0.91  (01/29/2010)   Serum potassium 4.6  (01/29/2010) CMP ordered   Self-Management Support :    Hypertension self-management support: Not documented    Lipid self-management support: Not documented

## 2010-05-28 NOTE — Letter (Signed)
Summary: EKG 02-13-2009  EKG 02-13-2009   Imported By: Faythe Ghee 08/24/2009 14:37:26  _____________________________________________________________________  External Attachment:    Type:   Image     Comment:   External Document

## 2010-05-28 NOTE — Letter (Signed)
Summary: DR Sudie Bailey RHYTHM STRIP  DR Sudie Bailey RHYTHM STRIP   Imported By: Faythe Ghee 02/07/2010 14:05:47  _____________________________________________________________________  External Attachment:    Type:   Image     Comment:   External Document

## 2010-05-28 NOTE — Consult Note (Signed)
Summary: [facility] - Dr. Dietrich Pates  [facility]   Imported By: Roderic Ovens 11/07/2009 14:55:58  _____________________________________________________________________  External Attachment:    Type:   Image     Comment:   External Document

## 2010-05-28 NOTE — Medication Information (Signed)
Summary: ccr-lr  Anticoagulant Therapy  Managed by: Vashti Hey, RN PCP: Dr.Stephen Orvan Falconer MD: Dietrich Pates MD, Molly Maduro Indication 1: Atrial Fibrillation Lab Used: [facility] [place] Site: [place] INR POC 3.7  Dietary changes: no    Health status changes: no    Bleeding/hemorrhagic complications: no    Recent/future hospitalizations: no    Any changes in medication regimen? no    Recent/future dental: no  Any missed doses?: no       Is patient compliant with meds? yes       Allergies: 1)  ! * Sulfar 2)  ! * Iv Dye  Anticoagulation Management History:      The patient is taking warfarin and comes in today for a routine follow up visit.  Warfarin therapy is being given due to Atrial Fibrillation .  Positive risk factors for bleeding include an age of 71 years or older.  Negative risk factors for bleeding include no history of CVA/TIA, no history of GI bleeding, and absence of serious comorbidities.  The bleeding index is 'intermediate risk'.  Positive CHADS2 values include History of HTN.  Negative CHADS2 values include History of CHF, Age > 80 years old, History of Diabetes, and Prior Stroke/CVA/TIA.  The start date was 02/26/2010.  Anticoagulation responsible provider: Dietrich Pates MD, Molly Maduro.  INR POC: 3.7.  Cuvette Lot#: [id number].    Anticoagulation Management Assessment/Plan:      The patient's current anticoagulation dose is Warfarin sodium 5 mg tabs: take 1 tablet on MWFand Saturday and 1/2 tablet all other days.  The target INR is 2.0-3.0.  The next INR is due 03/27/2010.  Anticoagulation instructions were given to patient.  Results were reviewed/authorized by Vashti Hey, RN.  She was notified by Vashti Hey RN.         Prior Anticoagulation Instructions: INR 3.7 Decrease coumadin dose to 1/2 tablet once daily except none on Mondays and Thursdays  Current Anticoagulation Instructions: INR 3.7 Continue coumadin 2.5mg  once daily except none on  Mondays and Thursdays May need to decrease tablet strength next OV

## 2010-05-28 NOTE — Medication Information (Signed)
Summary: ccr-lr  Anticoagulant Therapy  Managed by: Vashti Hey, RN PCP: Dr.Stephen Orvan Falconer MD: Diona Browner MD, Remi Deter Indication 1: Atrial Fibrillation Lab Used: [facility] [place] Site: [place] INR POC 2.7  Dietary changes: no    Health status changes: no    Bleeding/hemorrhagic complications: no    Recent/future hospitalizations: no    Any changes in medication regimen? no    Recent/future dental: no  Any missed doses?: no       Is patient compliant with meds? yes       Allergies: 1)  ! * Sulfar 2)  ! * Iv Dye  Anticoagulation Management History:      The patient is taking warfarin and comes in today for a routine follow up visit.  Warfarin therapy is being given due to Atrial Fibrillation .  Positive risk factors for bleeding include an age of 71 years or older.  Negative risk factors for bleeding include no history of CVA/TIA, no history of GI bleeding, and absence of serious comorbidities.  The bleeding index is 'intermediate risk'.  Positive CHADS2 values include History of HTN.  Negative CHADS2 values include History of CHF, Age > 71 years old, History of Diabetes, and Prior Stroke/CVA/TIA.  The start date was 02/26/2010.  Anticoagulation responsible provider: Diona Browner MD, Remi Deter.  INR POC: 2.7.  Cuvette Lot#: [id number].    Anticoagulation Management Assessment/Plan:      The patient's current anticoagulation dose is Warfarin sodium 2.5 mg tabs: Use as directed by Anticoagualtion Clinic.  The target INR is 2.0-3.0.  The next INR is due 04/11/2010.  Anticoagulation instructions were given to patient.  Results were reviewed/authorized by Vashti Hey, RN.  She was notified by Vashti Hey RN.         Prior Anticoagulation Instructions: INR 3.7 Continue coumadin 2.5mg  once daily except none on Mondays and Thursdays May need to decrease tablet strength next OV  Current Anticoagulation Instructions: INR 2.7 Continue coumadin 2.5mg  once daily except  none on Mondays and Thursdays Prescriptions: WARFARIN SODIUM 2.5 MG TABS (WARFARIN SODIUM) Use as directed by Anticoagualtion Clinic  #30 x 3   Entered by:   Vashti Hey RN   Authorized by:   Kathlen Brunswick, MD, [facility]   Signed by:   Vashti Hey RN on 03/27/2010   Method used:   Electronically to        [facility]* (retail)       [address] [place], [place]  [address]       Ph: [phone]       Fax: [phone]   RxID:   [id number]

## 2010-05-28 NOTE — Letter (Signed)
Summary: DR Sudie Bailey OFFICE NOTE  DR Sudie Bailey OFFICE NOTE   Imported By: Faythe Ghee 03/05/2010 12:58:52  _____________________________________________________________________  External Attachment:    Type:   Image     Comment:   External Document

## 2010-05-28 NOTE — Medication Information (Signed)
Summary: new to coumadin per checkout on 02/26/10/tg  Anticoagulant Therapy  Managed by: Vashti Hey, RN PCP: Dr.Stephen Orvan Falconer MD: Dietrich Pates MD, Molly Maduro Indication 1: Atrial Fibrillation Lab Used: [facility] [place] Site: [place] INR POC 5.6  Dietary changes: yes       Details: explained role of Vit K foods with couamdin   Health status changes: yes       Details: PAF  Bleeding/hemorrhagic complications: yes       Details: bleeding precaution sdiscussed with pt  Recent/future hospitalizations: no    Any changes in medication regimen? yes       Details: started on coumadin 5mg  qd except 2.5mg  on S,T,Th   Has 5 mg tablet  Recent/future dental: no  Any missed doses?: yes     Details: Importance of taking coumadin as ordered and keeping INR appts as scheduled stressed with pt  Is patient compliant with meds? no     Details: Importance of medication compliance and appt compliance discussed with pt   Allergies: 1)  ! * Sulfar 2)  ! * Iv Dye  Anticoagulation Management History:      The patient comes in today for her initial visit for anticoagulation therapy.  Warfarin therapy is being given due to Atrial Fibrillation .  Positive risk factors for bleeding include an age of 70 years or older.  Negative risk factors for bleeding include no history of CVA/TIA, no history of GI bleeding, and absence of serious comorbidities.  The bleeding index is 'intermediate risk'.  Positive CHADS2 values include History of HTN.  Negative CHADS2 values include History of CHF, Age > 106 years old, History of Diabetes, and Prior Stroke/CVA/TIA.  The start date was 02/26/2010.  Anticoagulation responsible Shyam Dawson: Dietrich Pates MD, Molly Maduro.  INR POC: 5.6.  Cuvette Lot#: [id number].    Anticoagulation Management Assessment/Plan:      The patient's current anticoagulation dose is Warfarin sodium 5 mg tabs: take 1 tablet on MWFand Saturday and 1/2 tablet all other days.  The target INR is  2.0-3.0.  The next INR is due 03/11/2010.  Anticoagulation instructions were given to patient.  Results were reviewed/authorized by Vashti Hey, RN.  She was notified by Vashti Hey RN.        Coagulation management information includes: 02/26/10  Was started on coumadin 5mg  once daily except 2.5mg  on S,T,Th at Dr Dietrich Pates appt.  Current Anticoagulation Instructions: INR 5.6 Hold couamdin tonight and tomorrow then decrease dose to 2.5mg  once daily

## 2010-05-28 NOTE — Letter (Signed)
Summary: STRESS TEST DR Sudie Bailey  STRESS TEST DR Sudie Bailey   Imported By: Faythe Ghee 08/24/2009 14:36:47  _____________________________________________________________________  External Attachment:    Type:   Image     Comment:   External Document

## 2010-05-30 NOTE — Assessment & Plan Note (Signed)
Summary: 2 mth f/u per checkout on 02/26/10/tg   Visit Type:  Follow-up Primary Provider:  Dr.Stephen Sudie Bailey   History of Present Illness: [name] returns to the office for continued assessment and treatment of atrial fibrillation and hypertension.  Since her last visit, she has done well from a symptomatic standpoint.  She denies orthopnea, PND, chest pain or exertional dyspnea.  Stool was not tested for occult blood, as she was disinclined to collect the specimens.  She is not checked blood pressures at home as requested.  Current Medications (verified): 1)  Losartan Potassium 50 Mg Tabs (Losartan Potassium) .... Take 1 Tab Daily 2)  Atenolol 100 Mg Tabs (Atenolol) .... Take 1/2 Tab Daily 3)  Premarin 0.3 Mg Tabs (Estrogens Conjugated) .... Take 1 Tablet By Mouth Once Daily 4)  Prilosec 40 Mg Cpdr (Omeprazole) .... Take 1 Tab Daily 5)  Simvastatin 10 Mg Tabs (Simvastatin) .... Take 1 Tablet By Mouth At Bedtime 6)  Diltiazem Hcl Er Beads 120 Mg Xr24h-Cap (Diltiazem Hcl Er Beads) .... Take 1 Tablet By Mouth Once A Day 7)  Clobetasol Propionate 0.05 % Crea (Clobetasol Propionate) .... Use As Needed 8)  Vitamin D 400 Unit Caps (Cholecalciferol) .Marland Kitchen.. 1 Tablet By Mouth Once Daily 9)  Calcium 600 1500 Mg Tabs (Calcium Carbonate) .... Take 1 Tablet By Mouth Once Daily 10)  Warfarin Sodium 2.5 Mg Tabs (Warfarin Sodium) .... Use As Directed By Anticoagualtion Clinic  Allergies (verified): 1)  ! * Sulfar 2)  ! * Iv Dye  Comments:  Nurse/Medical Assistant: patient brought med list no changes in meds since last ov   Past History:  PMH, FH, and Social History reviewed and updated.  Review of Systems       See history of present illness.  Vital Signs:  Patient profile:   70 year old female Weight:      163 pounds BMI:     30.91 O2 Sat:      97 % on Room air Pulse rate:   84 / minute BP sitting:   146 / 88  (right arm)  Vitals Entered By: Dreama Saa, CNA (April 30, 2010  11:20 AM)  O2 Flow:  Room air  Physical Exam  General:  Moderately overweight; well developed;no acute distress Repeat blood pressure determination-120/80 Neck-No JVD; no carotid bruits: Lungs-No tachypnea, no rales; no rhonchi; no wheezes: Cardiovascular-irregular rhythm;normal PMI; normal S1 and S2: Abdomen-BS normal; soft and non-tender without masses or organomegaly:  Musculoskeletal-No deformities, no cyanosis or clubbing: Neurologic-Normal cranial nerves; symmetric strength and tone:  Skin-Warm, no significant lesions: Extremities-Nl distal pulses; no edema:     Impression & Recommendations:  Problem # 1:  FIBRILLATION, ATRIAL (ICD-427.31) Heart rate is well controlled, and patient remains asymptomatic. Our principle approach will be rate control and ongoing anticoagulation.  Problem # 2:  ANTICOAGULATION (ICD-V58.61) Patient has no evidence for blood loss and appears to be tolerating Coumadin well.  Teaching was provided included management of diet.  After additional discussion, she agrees to return cards for Hemoccult testing.  Problem # 3:  HYPERTENSION (ICD-401.1) Blood pressure control appears to be good; current medications will be continued.  I will plan to see this nice woman again in one year.  She will be followed in anticoagulation clinic in the interim.  Other Orders: Hemoccult Cards (Take Home) (Hemoccult Cards)  Patient Instructions: 1)  Your physician recommends that you schedule a follow-up appointment in: 1 year 2)  Your physician recommends that  you continue on your current medications as directed. Please refer to the Current Medication list given to you today. 3)  Your physician has asked that you test your stool for blood. It is necessary to test 3 different stool specimens for accuracy. You will be given 3 hemoccult cards for specimen collection. For each stool specimen, place a small portion of stool sample (from 2 different areas of the stool) into  the 2 squares on the card. Close card. Repeat with 2 more stool specimens. Bring the cards back to the office for testing.

## 2010-05-30 NOTE — Progress Notes (Signed)
Summary: coumadin adjustment: On cipro  Phone Note Call from Patient   Caller: Patient Reason for Call: Talk to Nurse Summary of Call: Called stating Dr Sudie Bailey just started her on Cipro 500mg  two times a day x 10 days.  Told pt to decrease coumadin to 1.25mg  once daily until finished with antibiotic then resume 1.25mg  once daily except 2.5mg  on Tuesdays/Saturdays and recheck INR on 06/06/10.

## 2010-05-30 NOTE — Miscellaneous (Signed)
Summary: hemoccult cards 04/30/2010  Clinical Lists Changes  Observations: Added new observation of HEMOCCULT 3: neg (04/30/2010 16:19) Added new observation of HEMOCCULT 2: neg (04/30/2010 16:19) Added new observation of HEMOCCULT 1: neg (04/30/2010 16:19)

## 2010-05-30 NOTE — Medication Information (Signed)
Summary: ccr-lr  Anticoagulant Therapy  Managed by: Vashti Hey, RN PCP: Dr.Stephen Orvan Falconer MD: Diona Browner MD, Remi Deter Indication 1: Atrial Fibrillation Lab Used: [facility] [place] Site: [place] INR POC 3.1  Dietary changes: no    Health status changes: no    Bleeding/hemorrhagic complications: no    Recent/future hospitalizations: no    Any changes in medication regimen? no    Recent/future dental: no  Any missed doses?: no       Is patient compliant with meds? yes       Allergies: 1)  ! * Sulfar 2)  ! * Iv Dye  Anticoagulation Management History:      The patient is taking warfarin and comes in today for a routine follow up visit.  Warfarin therapy is being given due to Atrial Fibrillation .  Positive risk factors for bleeding include an age of 40 years or older.  Negative risk factors for bleeding include no history of CVA/TIA, no history of GI bleeding, and absence of serious comorbidities.  The bleeding index is 'intermediate risk'.  Positive CHADS2 values include History of HTN.  Negative CHADS2 values include History of CHF, Age > 38 years old, History of Diabetes, and Prior Stroke/CVA/TIA.  The start date was 02/26/2010.  Anticoagulation responsible provider: Diona Browner MD, Remi Deter.  INR POC: 3.1.  Cuvette Lot#: [id number].    Anticoagulation Management Assessment/Plan:      The patient's current anticoagulation dose is Warfarin sodium 2.5 mg tabs: Use as directed by Anticoagualtion Clinic.  The target INR is 2.0-3.0.  The next INR is due 05/01/2010.  Anticoagulation instructions were given to patient.  Results were reviewed/authorized by Vashti Hey, RN.  She was notified by Vashti Hey RN.         Prior Anticoagulation Instructions: INR 2.7 Continue coumadin 2.5mg  once daily except none on Mondays and Thursdays  Current Anticoagulation Instructions: INR 3.1 Continue coumadin 2.5mg  once daily except none on Mondays and Thursdays

## 2010-05-30 NOTE — Medication Information (Signed)
Summary: 1/6 protime per checkout on 04/30/10/tg  Anticoagulant Therapy  Managed by: Vashti Hey, RN PCP: Dr.Stephen Orvan Falconer MD: Diona Browner MD, Remi Deter Indication 1: Atrial Fibrillation Lab Used: [facility] [place] Site: [place] INR POC 2.7  Dietary changes: no    Health status changes: no    Bleeding/hemorrhagic complications: no    Recent/future hospitalizations: no    Any changes in medication regimen? no    Recent/future dental: no  Any missed doses?: no       Is patient compliant with meds? yes       Allergies: 1)  ! * Sulfar 2)  ! * Iv Dye  Anticoagulation Management History:      The patient is taking warfarin and comes in today for a routine follow up visit.  Anticoagulation is being administered due to Atrial Fibrillation .  Positive risk factors for bleeding include an age of 28 years or older.  Negative risk factors for bleeding include no history of CVA/TIA, no history of GI bleeding, and absence of serious comorbidities.  The bleeding index is 'intermediate risk'.  Positive CHADS2 values include History of HTN.  Negative CHADS2 values include History of CHF, Age > 55 years old, History of Diabetes, and Prior Stroke/CVA/TIA.  The start date was 02/26/2010.  Anticoagulation responsible provider: Diona Browner MD, Remi Deter.  INR POC: 2.7.  Cuvette Lot#: [id number].  Exp: 12/2010.    Anticoagulation Management Assessment/Plan:      The patient's current anticoagulation dose is Warfarin sodium 2.5 mg tabs: Use as directed by Anticoagualtion Clinic.  The target INR is 2.0-3.0.  The next INR is due 06/03/2010.  Anticoagulation instructions were given to patient.  Results were reviewed/authorized by Vashti Hey, RN.  She was notified by Vashti Hey RN.         Prior Anticoagulation Instructions: INR 3.4 TODAY HOLD TODAYS DOSE THEN DECREASE DOSE TO 0.5 TABLET ON WEDNESDAY AND NONE ON MONDAYS AND THURSDAYS  AND 1 ALL OTHER DAYS  Current Anticoagulation  Instructions: INR 2.7 Take coumadin 1/2 tablet once daily except 1 tablet on Tuesdays and Saturdays

## 2010-05-30 NOTE — Medication Information (Signed)
Summary: ccr-lr  Anticoagulant Therapy  Managed by: Teressa Lower, RN PCP: Dr.Stephen Orvan Falconer MD: Diona Browner MD, Remi Deter Indication 1: Atrial Fibrillation Lab Used: [facility] [place] Site: [place] INR POC 3.4  Dietary changes: no    Health status changes: no    Bleeding/hemorrhagic complications: no    Recent/future hospitalizations: no    Any changes in medication regimen? no    Recent/future dental: no  Any missed doses?: no       Is patient compliant with meds? yes       Current Medications (verified): 1)  Losartan Potassium 50 Mg Tabs (Losartan Potassium) .... Take 1 Tab Daily 2)  Atenolol 100 Mg Tabs (Atenolol) .... Take 1/2 Tab Daily 3)  Premarin 0.3 Mg Tabs (Estrogens Conjugated) .... Take 1 Tablet By Mouth Once Daily 4)  Prilosec 40 Mg Cpdr (Omeprazole) .... Take 1 Tab Daily 5)  Simvastatin 10 Mg Tabs (Simvastatin) .... Take 1 Tablet By Mouth At Bedtime 6)  Diltiazem Hcl Er Beads 120 Mg Xr24h-Cap (Diltiazem Hcl Er Beads) .... Take 1 Tablet By Mouth Once A Day 7)  Clobetasol Propionate 0.05 % Crea (Clobetasol Propionate) .... Use As Needed 8)  Vitamin D 400 Unit Caps (Cholecalciferol) .Marland Kitchen.. 1 Tablet By Mouth Once Daily 9)  Calcium 600 1500 Mg Tabs (Calcium Carbonate) .... Take 1 Tablet By Mouth Once Daily 10)  Warfarin Sodium 2.5 Mg Tabs (Warfarin Sodium) .... Use As Directed By Anticoagualtion Clinic  Allergies (verified): 1)  ! * Sulfar 2)  ! * Iv Dye  Anticoagulation Management History:      The patient is taking warfarin and comes in today for a routine follow up visit.  Anticoagulation is being administered due to Atrial Fibrillation .  Positive risk factors for bleeding include an age of 55 years or older.  Negative risk factors for bleeding include no history of CVA/TIA, no history of GI bleeding, and absence of serious comorbidities.  The bleeding index is 'intermediate risk'.  Positive CHADS2 values include History of HTN.   Negative CHADS2 values include History of CHF, Age > 40 years old, History of Diabetes, and Prior Stroke/CVA/TIA.  The start date was 02/26/2010.  Anticoagulation responsible provider: Diona Browner MD, Remi Deter.  INR POC: 3.4.  Cuvette Lot#: [id number].  Exp: 12/2010.    Anticoagulation Management Assessment/Plan:      The patient's current anticoagulation dose is Warfarin sodium 2.5 mg tabs: Use as directed by Anticoagualtion Clinic.  The target INR is 2.0-3.0.  The next INR is due 05/13/2010.  Anticoagulation instructions were given to patient.  Results were reviewed/authorized by Teressa Lower, RN.  She was notified by Teressa Lower RN.         Prior Anticoagulation Instructions: INR 3.1 Continue coumadin 2.5mg  once daily except none on Mondays and Thursdays  Current Anticoagulation Instructions: INR 3.4 TODAY HOLD TODAYS DOSE THEN DECREASE DOSE TO 0.5 TABLET ON WEDNESDAY AND NONE ON MONDAYS AND THURSDAYS  AND 1 ALL OTHER DAYS

## 2010-06-03 ENCOUNTER — Encounter (INDEPENDENT_AMBULATORY_CARE_PROVIDER_SITE_OTHER): Payer: Medicaid Other

## 2010-06-03 ENCOUNTER — Encounter: Payer: Self-pay | Admitting: Cardiology

## 2010-06-03 DIAGNOSIS — Z7901 Long term (current) use of anticoagulants: Secondary | ICD-10-CM

## 2010-06-03 DIAGNOSIS — I4891 Unspecified atrial fibrillation: Secondary | ICD-10-CM

## 2010-06-03 LAB — CONVERTED CEMR LAB: POC INR: 1.9

## 2010-06-13 NOTE — Medication Information (Signed)
Summary: ccr-lr [place]  Anticoagulant Therapy Managed by: Vashti Hey, RN Patient Assessment Part 2:  Have you MISSED ANY DOSES or CHANGED TABLETS?  0  Have you had any BRUISING or BLEEDING ( nose or gum bleeds,blood in urine or stool)?  Have you STARTED or STOPPED any MEDICATIONS, including OTC meds,herbals or supplements?  Have you CHANGED your DIET, especially green vegetables,or ALCOHOL intake?  Have you had any ILLNESSES or HOSPITALIZATIONS?  Have you had any signs of CLOTTING?(chest discomfort,dizziness,shortness of breath,arms tingling,slurred speech,swelling or redness in leg)      New  Tablet strength: : 2.5mg  Regimen Out:     Sunday: 0.5 tab Tablet     Monday: 0.5 tab Tablet     Tuesday: 1 tab Tablet     Wednesday: 0.5 tab Tablet     Thursday: 0.5 tab Tablet      Friday: 0.5 tab Tablet     Saturday: 1 tab Tablet Total Weekly: 11.25 mg mg  Next INR Due: 06/24/2010      Allergies: 1)  ! * Sulfar 2)  ! * Iv Dye  Anticoagulant Therapy  Managed by: Vashti Hey, RN PCP: Dr.Stephen Orvan Falconer MD: Dietrich Pates MD, Molly Maduro Indication 1: Atrial Fibrillation Lab Used: [facility] [place] Site: [place] INR POC 1.9  Dietary changes: no    Health status changes: no    Bleeding/hemorrhagic complications: no    Recent/future hospitalizations: no    Any changes in medication regimen? yes       Details: Took cipro x 10 days for sinusitis   Finshed this morning  Recent/future dental: no  Any missed doses?: no       Is patient compliant with meds? yes         Anticoagulation Management History:      The patient is taking warfarin and comes in today for a routine follow up visit.  Anticoagulation is being administered due to Atrial Fibrillation .  Positive risk factors for bleeding include an age of 57 years or older.  Negative risk factors for bleeding include no history of CVA/TIA, no history of GI bleeding, and absence of serious  comorbidities.  The bleeding index is 'intermediate risk'.  Positive CHADS2 values include History of HTN.  Negative CHADS2 values include History of CHF, Age > 22 years old, History of Diabetes, and Prior Stroke/CVA/TIA.  The start date was 02/26/2010.  Anticoagulation responsible provider: Dietrich Pates MD, Molly Maduro.  INR POC: 1.9.  Cuvette Lot#: [id number].  Exp: 12/2010.    Anticoagulation Management Assessment/Plan:      The patient's current anticoagulation dose is Warfarin sodium 2.5 mg tabs: Use as directed by Anticoagualtion Clinic.  The target INR is 2.0-3.0.  The next INR is due 06/24/2010.  Anticoagulation instructions were given to patient.  Results were reviewed/authorized by Vashti Hey, RN.  She was notified by Vashti Hey RN.         Prior Anticoagulation Instructions: INR 2.7 Take coumadin 1/2 tablet once daily except 1 tablet on Tuesdays and Saturdays  Current Anticoagulation Instructions: INR 1.9 Has finished ABX  Restart coumadin 1.25mg  once daily excdept 2.5mg  on Tuesdays and Fridays Pt will take coumadin 1 tablet tonight instead of tomorrow night this week

## 2010-06-24 ENCOUNTER — Encounter: Payer: Self-pay | Admitting: Cardiology

## 2010-06-24 ENCOUNTER — Encounter (INDEPENDENT_AMBULATORY_CARE_PROVIDER_SITE_OTHER): Payer: Medicare Other

## 2010-06-24 DIAGNOSIS — Z7901 Long term (current) use of anticoagulants: Secondary | ICD-10-CM

## 2010-06-24 DIAGNOSIS — I4891 Unspecified atrial fibrillation: Secondary | ICD-10-CM

## 2010-06-24 LAB — CONVERTED CEMR LAB: POC INR: 2.9

## 2010-07-04 NOTE — Medication Information (Signed)
Summary: ccr-lr  Anticoagulant Therapy  Managed by: Vashti Hey, RN PCP: Dr.Stephen Orvan Falconer MD: Dietrich Pates MD, Molly Maduro Indication 1: Atrial Fibrillation Lab Used: [facility] [place] Site: [place] INR POC 2.9  Dietary changes: no    Health status changes: no    Bleeding/hemorrhagic complications: no    Recent/future hospitalizations: no    Any changes in medication regimen? no    Recent/future dental: no  Any missed doses?: no       Is patient compliant with meds? yes       Allergies: 1)  ! * Sulfar 2)  ! * Iv Dye  Anticoagulation Management History:      The patient is taking warfarin and comes in today for a routine follow up visit.  Anticoagulation is being administered due to Atrial Fibrillation .  Positive risk factors for bleeding include an age of 65 years or older.  Negative risk factors for bleeding include no history of CVA/TIA, no history of GI bleeding, and absence of serious comorbidities.  The bleeding index is 'intermediate risk'.  Positive CHADS2 values include History of HTN.  Negative CHADS2 values include History of CHF, Age > 42 years old, History of Diabetes, and Prior Stroke/CVA/TIA.  The start date was 02/26/2010.  Anticoagulation responsible provider: Dietrich Pates MD, Molly Maduro.  INR POC: 2.9.  Cuvette Lot#: [id number].  Exp: 12/2010.    Anticoagulation Management Assessment/Plan:      The patient's current anticoagulation dose is Warfarin sodium 2.5 mg tabs: Use as directed by Anticoagualtion Clinic.  The target INR is 2.0-3.0.  The next INR is due 07/22/2010.  Anticoagulation instructions were given to patient.  Results were reviewed/authorized by Vashti Hey, RN.  She was notified by Vashti Hey RN.         Prior Anticoagulation Instructions: INR 1.9 Has finished ABX  Restart coumadin 1.25mg  once daily excdept 2.5mg  on Tuesdays and Fridays Pt will take coumadin 1 tablet tonight instead of tomorrow night this week  Current  Anticoagulation Instructions: INR 2.9 Continue coumadin 1.25mg  once daily except 2.5mg  on Tuesdays and Saturdays

## 2010-07-14 LAB — COMPREHENSIVE METABOLIC PANEL
ALT: 41 U/L — ABNORMAL HIGH (ref 0–35)
AST: 43 U/L — ABNORMAL HIGH (ref 0–37)
Albumin: 4.2 g/dL (ref 3.5–5.2)
Alkaline Phosphatase: 73 U/L (ref 39–117)
CO2: 24 mEq/L (ref 19–32)
Chloride: 107 mEq/L (ref 96–112)
Creatinine, Ser: 0.77 mg/dL (ref 0.4–1.2)
GFR calc non Af Amer: >60 mL/min (ref >60)
Potassium: 3.5 mEq/L (ref 3.5–5.1)
Total Bilirubin: 0.6 mg/dL (ref 0.3–1.2)

## 2010-07-14 LAB — TROPONIN I: Troponin I: 0.02 ng/mL (ref 0.00–0.06)

## 2010-07-14 LAB — CBC
HCT: 42.9 % (ref 36.0–46.0)
MCH: 30.5 pg (ref 26.0–34.0)
MCV: 92.2 fL (ref 78.0–100.0)
RDW: 13.6 % (ref 11.5–15.5)
WBC: 10.6 10*3/uL — ABNORMAL HIGH (ref 4.0–10.5)

## 2010-07-14 LAB — CARDIAC PANEL(CRET KIN+CKTOT+MB+TROPI)
CK, MB: 1.2 ng/mL (ref 0.3–4.0)
CK, MB: 1.3 ng/mL (ref 0.3–4.0)
Relative Index: INVALID (ref 0.0–2.5)
Relative Index: INVALID (ref 0.0–2.5)
Total CK: 52 U/L (ref 7–177)
Troponin I: 0.03 ng/mL (ref 0.00–0.06)

## 2010-07-14 LAB — PROTIME-INR: Prothrombin Time: 13.8 seconds (ref 11.6–15.2)

## 2010-07-14 LAB — DIFFERENTIAL
Basophils Absolute: 0 10*3/uL (ref 0.0–0.1)
Eosinophils Absolute: 0.5 10*3/uL (ref 0.0–0.7)
Eosinophils Relative: 5 % (ref 0–5)
Monocytes Absolute: 0.9 10*3/uL (ref 0.1–1.0)

## 2010-07-14 LAB — CK TOTAL AND CKMB (NOT AT ARMC): CK, MB: 1.2 ng/mL (ref 0.3–4.0)

## 2010-07-16 ENCOUNTER — Encounter: Payer: Self-pay | Admitting: Cardiology

## 2010-07-16 DIAGNOSIS — I4891 Unspecified atrial fibrillation: Secondary | ICD-10-CM

## 2010-07-22 ENCOUNTER — Ambulatory Visit (INDEPENDENT_AMBULATORY_CARE_PROVIDER_SITE_OTHER): Payer: Medicare Other | Admitting: *Deleted

## 2010-07-22 DIAGNOSIS — I4891 Unspecified atrial fibrillation: Secondary | ICD-10-CM

## 2010-07-22 LAB — POCT INR: INR: 3

## 2010-07-31 LAB — URINALYSIS, ROUTINE W REFLEX MICROSCOPIC
Glucose, UA: NEGATIVE mg/dL
Ketones, ur: NEGATIVE mg/dL
Nitrite: NEGATIVE
Protein, ur: NEGATIVE mg/dL
pH: 6 (ref 5.0–8.0)

## 2010-07-31 LAB — DIFFERENTIAL
Basophils Absolute: 0 10*3/uL (ref 0.0–0.1)
Basophils Relative: 0 % (ref 0–1)
Eosinophils Absolute: 0.5 10*3/uL (ref 0.0–0.7)
Eosinophils Relative: 5 % (ref 0–5)
Monocytes Absolute: 0.9 10*3/uL (ref 0.1–1.0)
Monocytes Relative: 8 % (ref 3–12)

## 2010-07-31 LAB — CBC
HCT: 38.3 % (ref 36.0–46.0)
Platelets: 229 10*3/uL (ref 150–400)
RDW: 14.2 % (ref 11.5–15.5)
WBC: 10.3 10*3/uL (ref 4.0–10.5)

## 2010-07-31 LAB — COMPREHENSIVE METABOLIC PANEL
ALT: 37 U/L — ABNORMAL HIGH (ref 0–35)
AST: 40 U/L — ABNORMAL HIGH (ref 0–37)
Albumin: 3.7 g/dL (ref 3.5–5.2)
Alkaline Phosphatase: 80 U/L (ref 39–117)
Chloride: 103 mEq/L (ref 96–112)
Potassium: 3.7 mEq/L (ref 3.5–5.1)
Sodium: 139 mEq/L (ref 135–145)
Total Bilirubin: 0.9 mg/dL (ref 0.3–1.2)
Total Protein: 6.4 g/dL (ref 6.0–8.3)

## 2010-08-19 ENCOUNTER — Ambulatory Visit (INDEPENDENT_AMBULATORY_CARE_PROVIDER_SITE_OTHER): Payer: Medicare Other | Admitting: *Deleted

## 2010-08-19 DIAGNOSIS — I4891 Unspecified atrial fibrillation: Secondary | ICD-10-CM

## 2010-09-13 NOTE — Procedures (Signed)
NAME:  Stacie Huang, Stacie Huang                  ACCOUNT NO.:  [id number]   MEDICAL RECORD NO.:  [id number]          PATIENT TYPE:  INP   LOCATION:  A207                          FACILITY:  [facility]   PHYSICIAN:  Edward L. Juanetta Gosling, M.D.DATE OF BIRTH:  08-13-1940   DATE OF PROCEDURE:  08/12/2005  DATE OF DISCHARGE:  08/14/2005                                EKG INTERPRETATION   10:05, August 12, 2005.   The rhythm is atrial fibrillation, the ventricular response now has slowed  to about 112 from the previous tracing being approximately 170. There are ST-  T abnormalities which could indicate ischemia. Abnormal electrocardiogram.      Oneal Deputy. Juanetta Gosling, M.D.  Electronically Signed     ELH/MEDQ  D:  08/16/2005  T:  08/18/2005  Job:  [id number]

## 2010-09-13 NOTE — Consult Note (Signed)
NAME:  Stacie Huang, Stacie Huang NO.:  [id number]   MEDICAL RECORD NO.:  [id number]          PATIENT TYPE:  INP   LOCATION:  A207                          FACILITY:  [facility]   PHYSICIAN:  Nanetta Batty, M.D.   DATE OF BIRTH:  [birth year]   DATE OF CONSULTATION:  08/12/2005  DATE OF DISCHARGE:                                   CONSULTATION   Stacie Huang is a 70 year old separated white female, mother of two children,  who lives alone.  She is a patient of Dani Gobble, M.D., and Mila Homer.  Sudie Bailey, M.D.   Her past medical history is significant for hypertension, GERD, IBS,  hyperlipidemia and osteoarthritis.  She has had a negative nuclear stress  test in the past and a normal 2-D echocardiogram.  She denies chest pain or  shortness of breath.  She recently has had what sounds like a viral  gastroenteritis with nausea and vomiting and diarrhea and decreased p.o.  intake and has had ab onset of tachypalpitations over the last 24 hours.  She is here at [facility] ER, where she was treated with IV Adenocard and IV  diltiazem with some slowing of her heart rate.  She did complain of some  presyncope.   MEDICATIONS:  1.  Atenolol 50 mg.  2.  Lipitor 20 mg.  3.  Nexium.  4.  Aspirin 81 mg.   ALLERGIES:  SULFA.   REVIEW OF SYSTEMS:  See above.   PHYSICAL EXAMINATION:  VITAL SIGNS:  Blood pressure is 90/48 with a pulse of  approximately 120.  LUNGS:  Clear.  NECK:  Carotids are 2+ without bruits.  CARDIAC:  Heart reveals irregularly tachycardic rate.  EXTREMITIES:  There is no peripheral edema.   LABORATORY DATA:  Hemoglobin of 15.6, hematocrit of 46, platelet count of  313, white blood cell count of 89.  Sodium of 138, potassium 2.7, chloride  of 108, CO2 20, glucose 148, BUN 18, creatinine 1.3.  Her point of care  markers were negative.  A 12-lead EKG revealed atrial fibrillation with RVR  with initial ventricular rate of 177 with ultimate slowing to the 120 range  after  being put on IV diltiazem.   IMPRESSION:  Stacie Huang has atrial fibrillation with rapid ventricular  response probably related to beta blocker withdrawal secondary to decreased  p.o. intake from nausea and vomiting and hypokalemia.  I agree with subcu  Lovenox, repletion of potassium p.o. and IV, as well as reinitiation of beta  blockade.  I also agreed with keeping her on IV diltiazem for rate control  until the above measures take effect.  A 2-D echocardiogram is ordered,  which we will read.  She will be followed by our group on a daily basis  until discharge.      Nanetta Batty, M.D.  Electronically Signed     JB/MEDQ  D:  08/12/2005  T:  08/13/2005  Job:  [id number]   cc:   [facility]  [address]  [place], [place]   Dani Gobble, MD  Fax: [phone]  Mila Homer. Sudie Bailey, M.D.  Fax: [phone]

## 2010-09-13 NOTE — H&P (Signed)
NAME:  Stacie Huang, Stacie Huang                  ACCOUNT NO.:  [id number]   MEDICAL RECORD NO.:  [id number]          PATIENT TYPE:  INP   LOCATION:  A207                          FACILITY:  [facility]   PHYSICIAN:  Tesfaye D. Felecia Shelling, MD   DATE OF BIRTH:  [birth year]   DATE OF ADMISSION:  08/12/2005  DATE OF DISCHARGE:  LH                                HISTORY & PHYSICAL   CHIEF COMPLAINT:  Nausea, vomiting, and diarrhea.   HISTORY OF PRESENT ILLNESS:  This is a 70 year old female patient of Dr.  John Giovanni who came to the emergency room with above complaints. The  patient developed worsening, nausea, vomiting, and diarrhea since last  Sunday. Her nausea and vomiting stopped yesterday. However, the patient  continued to have diarrhea. She came to the emergency room for evaluation.  During evaluation in the emergency room the patient was found to be in  supraventricular tachycardia. She was given a dose of adenosine. However,  her rate changed to atrial fibrillation with a rapid ventricular response.  The patient was started on a Cardizem drip. The patient was found to have a  potassium of 2.7.  The patient was started on IV potassium and IV Cardizem.  She was admitted for further management.   REVIEW OF SYSTEMS:  The patient has no fever, chills, cough, chest pain,  shortness of breath, dysuria, urgency, or frequency of urination.   PAST MEDICAL HISTORY:  1.  Hypertension.  2.  Gastroesophageal reflux disease.  3.  Irritable bowel syndrome.  4.  Osteoarthritis.  5.  Hyperlipidemia.  6.  History of chest pain.   CURRENT MEDICATIONS:  1.  Aspirin 81 mg p.o. daily.  2.  Atenolol 100 mg daily.  3.  Lipitor 20 mg daily.  4.  Nexium 40 mg daily.  5.  Premarin 0.3 mg p.o. daily.   SOCIAL HISTORY:  The patient has no history of alcohol, tobacco, or  substance abuse.   PHYSICAL EXAMINATION:  GENERAL: The patient is alert, awake, and sick  looking.  VITAL SIGNS: Blood pressure 94/70, pulse 140  to 150, respiratory rate 24,  temperature 97.7 degrees Fahrenheit.  HEENT: Pupils equal, round, and reactive.  NECK: Supple.  CHEST: Decreased air entry, diffuse rhonchi.  CARDIOVASCULAR: S1 and S2 heard. Tachycardic and irregular.  ABDOMEN: Soft, positive bowel sounds. No rebound, mass, or organomegaly.  EXTREMITIES: No leg edema.   LABORATORIES ON ADMISSION:  CBC reveals WBC of 8.9, hemoglobin 15.6,  hematocrit 46.5, platelet count 313,000. Sodium 138, potassium 2.7, chloride  108, CO2 20, glucose 148, BUN 18,  creatinine 1.6, calcium 9.0. CPK 56, CK-  MB 1.8, troponin 0.05.   ASSESSMENT:  1.  New onset atrial fibrillation.  2.  Gastroenteritis.  3.  Hypokalemia.  4.  History of hypertension.  5.  History of hyperlipidemia  6.  Gastroesophageal reflux disease.   PLAN:  Will continue the patient on Cardizem drip. Will do a cardiology  consult. Will supplement potassium. Will do stool workup.  Will monitor her  electrolytes and continue her regular medications.  Tesfaye D. Felecia Shelling, MD  Electronically Signed     TDF/MEDQ  D:  08/12/2005  T:  08/12/2005  Job:  [id number]

## 2010-09-13 NOTE — Procedures (Signed)
NAME:  Stacie Huang, KOCOUREK                  ACCOUNT NO.:  [id number]   MEDICAL RECORD NO.:  [id number]          PATIENT TYPE:  INP   LOCATION:  A207                          FACILITY:  [facility]   PHYSICIAN:  Edward L. Juanetta Gosling, M.D.DATE OF BIRTH:  04-25-1941   DATE OF PROCEDURE:  DATE OF DISCHARGE:  08/14/2005                                EKG INTERPRETATION   9:41, August 12, 2005.   The rhythm appears to be a supraventricular tachycardia with a rate of 180.  It appears to probably be atrial fibrillation. There are ST-T wave changes  which could indicate ischemia. Abnormal electrocardiogram.      Oneal Deputy. Juanetta Gosling, M.D.  Electronically Signed     ELH/MEDQ  D:  08/16/2005  T:  08/18/2005  Job:  [id number]

## 2010-09-13 NOTE — Procedures (Signed)
NAMELEOMIA, Stacie Huang                  ACCOUNT NO.:  [id number]   MEDICAL RECORD NO.:  [id number]          PATIENT TYPE:  INP   LOCATION:  A207                          FACILITY:  [facility]   PHYSICIAN:  Dani Gobble, MD       DATE OF BIRTH:  March 07, 1941   DATE OF PROCEDURE:  08/13/2005  DATE OF DISCHARGE:                                  ECHOCARDIOGRAM   REFERRING PHYSICIAN:  Mila Homer. Sudie Bailey, M.D. and Tesfaye D. Felecia Shelling, M.D.   INDICATIONS:  A 70 year old female with past medical history of hypertension  who was found to be in atrial fibrillation.   The technical quality of the study is somewhat limited secondary to patient  body and poor acoustic windows.   The patient appeared to be in rate-controlled atrial fibrillation during  this procedure.   The aorta measures normally at 2.7 cm.   Left atrium is at the upper limits of normal and measured at 4.0 cm.   The intraventricular septum and posterior wall were mildly thickened and  measured 1.3 cm and 1.4 cm, respectively.   The aortic valve is not well visualized but appeared to be grossly  structurally normal.   The mitral valve also appeared structurally normal.  No mitral valve  prolapse was identified.  Mild mitral regurgitation was noted.  Doppler  interrogation of the mitral valve is within normal limits.   Pulmonic valve was incompletely visualized but appeared to be grossly  structurally normal.   Tricuspid valve appeared to be grossly structurally normal with mild  tricuspid regurgitation noted.   Left ventricle is normal in size with LV IDD measuring 3.7 cm and the LV IC  measuring 2.5 cm.  Overall left ventricular systolic function appears to be  low normal with minimal global hypokinesis.   The right atrium and right ventricle appeared normal in size.  The right  ventricular systolic function appeared to be normal.   IMPRESSION:  1.  Mild concentric left ventricular hypertrophy.  2.  Mild mitral regurgitation.  3.  Mild tricuspid regurgitation.  4.  Normal left lower size with estimated ejection fraction at the lower      limits of normal with minimal global hypokinesis.           ______________________________  Dani Gobble, MD     AB/MEDQ  D:  08/13/2005  T:  08/13/2005  Job:  [id number]   cc:   Mila Homer. Sudie Bailey, M.D.  Fax: [phone]   Tesfaye D. Felecia Shelling, MD  Fax: [phone]

## 2010-09-13 NOTE — Discharge Summary (Signed)
NAME:  Stacie Huang, Stacie Huang                  ACCOUNT NO.:  [id number]   MEDICAL RECORD NO.:  [id number]          PATIENT TYPE:  INP   LOCATION:  A207                          FACILITY:  [facility]   PHYSICIAN:  Mila Homer. Sudie Bailey, M.D.DATE OF BIRTH:  May 19, 1940   DATE OF ADMISSION:  08/12/2005  DATE OF DISCHARGE:  04/19/2007LH                                 DISCHARGE SUMMARY   This 70 year old woman was admitted to the hospital with severe weakness,  diarrhea and atrial fibrillation.  She has a benign 3-day hospital course  extending from August 12, 2005 to August 14, 2005.  Vital signs remained  stable once her atrial fibrillation was controlled.   Her admission white cell count was 8900, H&H 15.6 and 46.5, MCV of 90 and  platelet count 313,000.  She had 37% neutrophils, 48% lymphs and 12 monos.  Her BMET showed a potassium of 2.7, glucose 148, BUN 18 and creatinine 1.3.  Cardiac markers were normal except for a troponin of 0.05 measured twice and  then dropped to 0.03.  Repeat CBC was essentially the same her second day,  and her BMET showed potassium up to 3.9 and glucose down to 102 with a  creatinine now 1 after IV fluids.  Her TSH was 2.8 and C. diff. in the stool  was negative, as is the rotavirus.  Final BMET on her third day showed  potassium still stable at 3.7, glucose 103 and creatinine 0.9.  Admission  chest x-ray showed hyperaeration, but otherwise nothing acute.   She was admitted to the hospital by Dr. Felecia Shelling, on call for me.  She received  Adenocard and IV in the ER followed by Cardizem 20 mg IV and then a Cardizem  drip at 5 mg an hour, nitroglycerin 0.4 mg sublingually, she had Zofran for  nausea and then by the time she came up to the floor, she was put on a  Cardizem drip, started on Lovenox 1 mg per kg q.12h., continued on Atenolol  100 mg daily, Lipitor 20 mg q.h.s., aspirin 81 mg daily, Premarin 0.3 mg  daily and was put on Nexium 40 mg daily.  She was on Imodium q.6h. for  loose  stool, which really did not help, so I put her on Lorcet Plus q.i.d. and 1  of these really stopped the diarrhea.  Her second day, she was taken off the  Cardizem drip and put on Cardizem 30 mg q.6h.  Blood pressures ran quite low  on this, but she felt a lot better by her third day and diarrhea cleared,  and her strength improved and she was ready for discharge home.   Before discharge, I discussed her case with Dr. Domingo Sep, cardiologist.  She  felt that the atrial fibrillation might be related to the patient's low  potassium and therefore the patient was not sent home on Coumadin.   FINAL DIAGNOSES:  1.  New onset atrial fibrillation.  2.  Viral gastroenteritis.  3.  Hypokalemia.  4.  Dehydration.  5.  Essential hypertension, benign.  6.  Hypercholesterolemia.  7.  Estrogen replacement.   She is to continue on the same medicine she was on prior to admission, which  includes:  1.  Aspirin 81 mg per day.  2.  Atenolol 100 mg daily.  3.  Premarin 0.3 mg daily.  4.  Lipitor 20 mg daily.  5.  Nexium 40 mg daily.   PLAN:  She is to have follow up in the office early next week and we will  recheck an EKG on her at that time.  Follow up will also be with  [facility] within 2-3 weeks after discharge.      Mila Homer. Sudie Bailey, M.D.  Electronically Signed     SDK/MEDQ  D:  08/14/2005  T:  08/14/2005  Job:  [id number]   cc:   Dani Gobble, MD  Fax: [phone]

## 2010-09-13 NOTE — Group Therapy Note (Signed)
NAME:  Stacie Huang, Stacie Huang                  ACCOUNT NO.:  [id number]   MEDICAL RECORD NO.:  [id number]          PATIENT TYPE:  INP   LOCATION:  A207                          FACILITY:  [facility]   PHYSICIAN:  Mila Homer. Sudie Bailey, M.D.DATE OF BIRTH:  1941/02/07   DATE OF PROCEDURE:  08/13/2005  DATE OF DISCHARGE:                                   PROGRESS NOTE   This 69 year old was admitted to the hospital with viral gastroenteritis,  hypokalemia, and then atrial fibrillation.  Vomiting had cleared by the time  she came to the hospital, and diarrhea has almost let up by today.  She  feels a good deal better.   OBJECTIVE:  Temperature 97.4, pulse 71, respiratory rate 20, blood pressure  118/66.  The heart has a regular rhythm, rate 70.  Lungs clear, moving air  well.  Abdomen is soft.  There is no edema of the ankles.  Skin turgor is  normal.  Mucous membranes are moist.  She is oriented and alert today.  She  is in no acute distress.   I reviewed her cardiac enzymes.  They were normal.  Troponin was up to 0.05  at one point but then dropped to 0.03 today.  MET-7 today showed potassium  3.9, up from 2.7 yesterday.  Glucose was 103, down from 148.  White cell  count initially was 8900 with 37 neutrophils, 48 lymphs, 12 monos.   The cardiac monitor today showed normal sinus rhythm.   ASSESSMENT:  1.  New onset atrial fibrillation.  2.  Viral gastroenteritis.  3.  Hypokalemia, resolved.   PLAN:  1.  Up in chair, may ambulate.  2.  Continue IV fluids, potassium.  3.  She has been seen by Dr. Domingo Sep, cardiologist, and I have reviewed her      note.  She may possibly need Coumadin, but in this setting, I think that      she will not.      Mila Homer. Sudie Bailey, M.D.  Electronically Signed     SDK/MEDQ  D:  08/13/2005  T:  08/13/2005  Job:  [id number]

## 2010-09-13 NOTE — Op Note (Signed)
NAME:  Stacie Huang, Stacie Huang                            ACCOUNT NO.:  [id number]   MEDICAL RECORD NO.:  [id number]                   PATIENT TYPE:  AMB   LOCATION:  DAY                                  FACILITY:  [facility]   PHYSICIAN:  R. Roetta Sessions, M.D.              DATE OF BIRTH:  1940/07/20   DATE OF PROCEDURE:  03/10/2002  DATE OF DISCHARGE:                                 OPERATIVE REPORT   PROCEDURE:  Esophagogastroduodenoscopy with Elease Hashimoto dilation followed by  incomplete colonoscopy (sigmoidoscopy).   ENDOSCOPIST:  Gerrit Friends. Rourk, M.D.   INDICATIONS FOR PROCEDURE:  The patient is a 70 year old lady with a history  of GERD, history of dysphagia, and a previous history of Maloney dilation of  her esophagus.  She now reports some recurrent esophageal dysphagia and  wants to have another EGD.  Also, she has never had full colonoscopy for  colorectal cancer screening purposes.  We are now planning to do EGD with  dilation as appropriate and colonoscopy for the above-mentioned reasons.  Please see my dictated consultation note.  ASA II.   DESCRIPTION OF PROCEDURE:  O2 saturation, blood pressure, pulse and  respirations were monitored throughout the entirety of both procedures.   CONSCIOUS SEDATION:  Versed 5 mg IV, Demerol 100 mg IV in divided doses and  Cetacaine spray for topical pharyngeal anesthesia.   INSTRUMENT:  Olympus video chip adult gastroscope and adult colonoscopy and  pediatric colonoscope.   ESOPHAGOGASTRODUODENOSCOPY FINDINGS:  Examination of the tubular esophagus  revealed no musocal abnormalities.  The EG junction was easily traversed.   STOMACH:  The gastric cavity was empty.  It insufflated well with air.  A  thorough examination of the gastric mucosa including a retroflex view of the  proximal stomach and esophagogastric junction again demonstrated a very  small hiatal hernia and a 2 mm AVM in the antrum.  Gastric mucosa otherwise  appeared normal.  The  pylorus was patent and easily traversed.   DUODENUM:  The bulb and the second portion appeared normal.   THERAPY/DIAGNOSTIC MANEUVERS:  A 56 French Maloney dilator was passed to  full insertion with ease.  A look back revealed a slight tear at the EG  junction, but otherwise no apparent complication.   The patient tolerated the procedure well and was prepared for colonoscopy.   COLONOSCOPY FINDINGS:  A digital rectal exam revealed no abnormalities.   ENDOSCOPIC FINDINGS:  The prep was good.   RECTUM:  Examination of the rectal mucosa including the retroflex view of  the anal verge revealed no abnormalities.   COLON:  The colonic mucosa was surveyed from the rectosigmoid junction to 40  cm. At 40 cm there appeared to be a greater than 90-degree turn in the  colon.  The colon, at this level was noncompliant.  I spent quite a while  attempting to negotiate this segment of the  colon with the adult scope using  a combination of changing of the patient's position, and external abdominal  pressure.  I was unsuccessful.  I subsequently withdrew the adult scope and  obtained a pediatric colonoscope and easily reintubated the colon up to 40  cm and again ran into the same problem and tried all of the above-mentioned  maneuvers once again.  Slide by technique would not be safe in this setting  and I did not try it.  Subsequently I pulled back and inspected the distal  colon and rectum once again and no mucosal abnormalities were observed.  The  patient tolerated the incomplete procedure well and was reacted in  endoscopy.   ESOPHAGOGASTRODUODENOSCOPY IMPRESSION:  1. Normal esophagus.  2. Small hiatal hernia.  3. Tiny AVM in antrum, otherwise normal stomach and D1 and D2.  4. Status post passage of the [facility] dilator.   COLONOSCOPY IMPRESSION:  1. Incomplete colonoscopy (sigmoidoscopy).  2. Normal rectum.  3. Normal colon to 40 cm.  Complete exam could not be carried out for the      reasons mentioned above.   RECOMMENDATIONS:  1. The patient continues to have some reflux symptoms and I am going to try     something besides Prevacid.  I am going to start her on some Nexium 40 mg     orally daily, prescription given.  2. Will complement today's limited examination of the lower GI tract with     air-contrast barium enema next week.  3. Further recommendations to follow.                                               Jonathon Bellows, M.D.    RMR/MEDQ  D:  03/10/2002  T:  03/10/2002  Job:  [id number]   cc:   Francoise Schaumann. Halm, D.O.  [address], [address]  [place]  [place] [address]  Fax: [phone]

## 2010-09-13 NOTE — Procedures (Signed)
NAME:  Stacie Huang, Stacie Huang                  ACCOUNT NO.:  [id number]   MEDICAL RECORD NO.:  [id number]          PATIENT TYPE:  INP   LOCATION:  A207                          FACILITY:  [facility]   PHYSICIAN:  Edward L. Juanetta Gosling, M.D.DATE OF BIRTH:  [birth year]   DATE OF PROCEDURE:  08/12/2005  DATE OF DISCHARGE:  08/14/2005                                EKG INTERPRETATION   10:10, August 12, 2005.   The rhythm is atrial fibrillation with a rapid ventricular response of about  120. There was early transition to QRS positivity across the precordial  lead. There are diffuse but nonspecific ST-T wave abnormalities. Abnormal  electrocardiogram.      Oneal Deputy. Juanetta Gosling, M.D.  Electronically Signed     ELH/MEDQ  D:  08/16/2005  T:  08/18/2005  Job:  [id number]

## 2010-09-13 NOTE — Op Note (Signed)
   NAME:  Stacie Huang, Stacie Huang                            ACCOUNT NO.:  [id number]   MEDICAL RECORD NO.:  [id number]                   PATIENT TYPE:  OUT   LOCATION:  RAD                                  FACILITY:  [facility]   PHYSICIAN:  Sherral Hammers, M.D.               DATE OF BIRTH:  06-05-1940   DATE OF PROCEDURE:  DATE OF DISCHARGE:  12/02/2001                                 OPERATIVE REPORT   PROCEDURE:  Stress portion of nuclear stress test.   INDICATIONS FOR PROCEDURE:  The patient is a 70 year old female who has  recently been experiencing chest pain.  She is subsequently referred for  nuclear stress test today.   PERSANTINE TECHNICTIUM-99 CARDIOLITE MYOCARDIAL PERFUSION STUDY:  The  patient performed the standard rest/ pharmacologic stress protocol.   ELECTROCARDIOGRAPHIC/HEMODYNAMIC DATA:  The resting electrocardiogram  revealed normal sinus rhythm without abnormality.  Resting blood pressure  was 150/76 while the resting pulse was 83 beats per minute.  Blood pressure  remained stable throughout this procedure.  Heart rate did increase to a  peak of 116 beats per minute.  This recovered nicely during recovery.  She  did experience chest discomfort similar to what prompted her evaluation  during this procedure.  This was promptly resolved with the administration  of IV aminophylline.  Electrocardiogram revealed no further changes during  this procedure.  She had rare PVCs.   IMPRESSION:  1. Clinically positive for angina.  2. Electrocardiogram negative for ischemia.  3. Images are pending.                                               Sherral Hammers, M.D.    ATB/MEDQ  D:  12/02/2001  T:  12/07/2001  Job:  [id number]   cc:   [name], MD  [address] [place], [place] [address]  Fax: [phone]

## 2010-09-13 NOTE — Procedures (Signed)
NAME:  Stacie Huang, Stacie Huang                  ACCOUNT NO.:  [id number]   MEDICAL RECORD NO.:  [id number]          PATIENT TYPE:  INP   LOCATION:  A207                          FACILITY:  [facility]   PHYSICIAN:  Edward L. Juanetta Gosling, M.D.DATE OF BIRTH:  [birth year]   DATE OF PROCEDURE:  08/12/2005  DATE OF DISCHARGE:  08/14/2005                                EKG INTERPRETATION   9:42, August 12, 2005.   The rhythm is what appears to be atrial fibrillation with ventricular  response of about 170. There are ST-T wave changes which are suggestive at  least of ischemia. Abnormal electrocardiogram.      Oneal Deputy. Juanetta Gosling, M.D.  Electronically Signed     ELH/MEDQ  D:  08/16/2005  T:  08/18/2005  Job:  [id number]

## 2010-09-13 NOTE — Procedures (Signed)
NAME:  Stacie Huang, Stacie Huang                  ACCOUNT NO.:  [id number]   MEDICAL RECORD NO.:  [id number]          PATIENT TYPE:  INP   LOCATION:  A207                          FACILITY:  [facility]   PHYSICIAN:  Edward L. Juanetta Gosling, M.D.DATE OF BIRTH:  1941-03-13   DATE OF PROCEDURE:  08/12/2005  DATE OF DISCHARGE:  08/14/2005                                EKG INTERPRETATION   9:38, August 12, 2005.   The rhythm is a supraventricular tachycardia with a rate of about 180. It  appears to be in atrial fibrillation. There are ST-T wave abnormalities  which could indicated ischemia. Abnormal electrocardiogram.      Oneal Deputy. Juanetta Gosling, M.D.  Electronically Signed     ELH/MEDQ  D:  08/16/2005  T:  08/18/2005  Job:  [id number]

## 2010-09-13 NOTE — H&P (Signed)
NAME:  Stacie Huang, Stacie Huang                              ACCOUNT NO.:  [id number]   MEDICAL RECORD NO.:  [id number]                  PATIENT TYPE:   LOCATION:                                       FACILITY:   PHYSICIAN:  R. Roetta Sessions, M.D.              DATE OF BIRTH:  [birth year]   DATE OF ADMISSION:  DATE OF DISCHARGE:                                HISTORY & PHYSICAL   H&P for upcoming endoscopy   CHIEF COMPLAINT:  Chest pain, reflux, need for colorectal cancer screening.   HISTORY OF PRESENT ILLNESS:  This patient is a pleasant, 70 year old lady  last seen for atypical chest pain, GERD, 11/16/01.  It was felt that her  typical GERD symptoms improved on Prevacid 30 mg orally daily.  We talked to  her about full colonoscopy as colorectal cancer screening maneuver; however,  she was in the process of getting a cardiac evaluation when we last saw her.  She indeed, did see Dr. Domingo Sep who performed a nuclear stress test on her  and did not feel that the chest pain was emanating from her heart.  She is  not having any dysphagia, no odynophagia, no nausea or vomiting, no  abdominal pain.  No change in bowel habits.  I did do a sigmoidoscopy on  this lady back in 1997 when I performed an EGD.  She was found to have some  patchy antral erythema.  Esophagus appeared normal.  She was having  dysphagia, and therefore, a [place] dilator was passed.  Dysphagia  resolved.  Her CLOtest was negative.  She tells me that Prevacid has helped  her reflux symptoms considerably.  She continues to have some chest pain off  and on and she would like me to look back in her esophagus.  Again, no new  symptoms.  She has not had any melena or rectal bleeding.   PAST MEDICAL HISTORY:  Significant for chronic headache, hypertension,  anxiety neurosis, and sinus problems.   PAST SURGICAL HISTORY:  Hysterectomy, cholecystectomy, and GI evaluation as  outlined above.   CURRENT MEDICATIONS:  1. Prevacid  30 mg daily.  2. Premarin 0.125 mg a day.  3. Lipitor daily.  4. Atenolol 50 mg daily.  5. ASA 81 mg daily.  6. Vitamin C.  7. Calcium __________ 25 mg daily.  8. Altace 2 mg daily.   ALLERGIES:  SULFA, HYOSCYAMINE.   FAMILY HISTORY:  Negative for chronic GI or liver disease.   SOCIAL HISTORY:  The patient has been married for 39 years.  This is her  second marriage.  She has 2 children.  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   REVIEW OF SYSTEMS:  As in history of present illness.   PHYSICAL EXAMINATION:  GENERAL:  Reveals a pleasant, 70 year old lady  resting comfortably.  VITAL SIGNS:  Weight 158.  BP  130/60, pulse 64.  SKIN:  Warm and dry.  HEENT:  No scleral icterus.  She is edentulous.  No oral lesions.  NECK:  JVD is not prominent.  CHEST:  Lungs are clear to auscultation.  CARDIAC:  Regular rate and rhythm without murmur, gallop, or rub.  BREASTS:  Exam is deferred.  ABDOMEN:  Nondistended, positive bowel sounds.  Soft, nontender, without  appreciable mass or organomegaly.  EXTREMITIES:  No edema.   IMPRESSION:  This patient is a pleasant 70 year old lady with history of  gastroesophageal reflux disease responsive to Prevacid.  She continues to  have an element of chest pain which is not likely cardiac in origin. She has  asked that she be rechecked for a hiatal hernia.  She wants to have another  endoscopy.  I do not think that __________ symptoms, but it has been some  time since she had her previous EGD.  Consequently, I have offered this lady  an EGD.  She has never had a full look at her entire colon, consequently I  have urged her to go ahead and have a full colonoscopy.  This approach has  been discussed with the patient at length.  The potential risks, benefits,  and alternatives have been reviewed, questions answered.  She is agreeable.  We will place to perform both esophagogastroduodenoscopy and colonoscopy in  the near future.  ASA  grade II.   Further recommendations to follow.                                               Stacie Huang, M.D.    RMR/MEDQ  D:  02/28/2002  T:  02/28/2002  Job:  [id number]   cc:   Mila Homer. Sudie Bailey, M.D.  [address] [place], [place] [address]  Fax: [phone]

## 2010-09-13 NOTE — Procedures (Signed)
NAME:  Stacie Huang, Stacie Huang                  ACCOUNT NO.:  [id number]   MEDICAL RECORD NO.:  [id number]          PATIENT TYPE:  INP   LOCATION:  A207                          FACILITY:  [facility]   PHYSICIAN:  Edward L. Juanetta Gosling, M.D.DATE OF BIRTH:  August 13, 1940   DATE OF PROCEDURE:  08/14/2005  DATE OF DISCHARGE:  08/14/2005                                EKG INTERPRETATION   The rhythm is a supraventricular tachycardia with a rate about 180.  ST, T  wave abnormalities are seen which could indicate ischemia or injury and  clinical correlation is suggested.   IMPRESSION:  Abnormal electrocardiogram.   Next on the same patient at 10:10 on August 12, 2005.   The rhythm is an atrial flutter fibrillation with a ventricular response of  about 120.  There is early transition to QRS positivity and there are  diffuse ST, T wave abnormalities.   IMPRESSION:  Abnormal electrocardiogram.      Edward L. Juanetta Gosling, M.D.  Electronically Signed     ELH/MEDQ  D:  08/14/2005  T:  08/14/2005  Job:  [id number]

## 2010-09-16 ENCOUNTER — Ambulatory Visit (INDEPENDENT_AMBULATORY_CARE_PROVIDER_SITE_OTHER): Payer: Medicare Other | Admitting: *Deleted

## 2010-09-16 DIAGNOSIS — I4891 Unspecified atrial fibrillation: Secondary | ICD-10-CM

## 2010-09-16 LAB — POCT INR: INR: 2.8

## 2010-09-30 ENCOUNTER — Other Ambulatory Visit: Payer: Self-pay | Admitting: Cardiology

## 2010-10-01 ENCOUNTER — Ambulatory Visit (HOSPITAL_COMMUNITY)
Admission: RE | Admit: 2010-10-01 | Discharge: 2010-10-01 | Disposition: A | Discharge status: Home / Self Care | Payer: Medicare Other | Source: Ambulatory Visit | Attending: Family Medicine | Admitting: Family Medicine

## 2010-10-01 ENCOUNTER — Other Ambulatory Visit (HOSPITAL_COMMUNITY): Payer: Self-pay | Admitting: Family Medicine

## 2010-10-01 DIAGNOSIS — M5430 Sciatica, unspecified side: Secondary | ICD-10-CM

## 2010-10-01 DIAGNOSIS — M51379 Other intervertebral disc degeneration, lumbosacral region without mention of lumbar back pain or lower extremity pain: Secondary | ICD-10-CM | POA: Insufficient documentation

## 2010-10-01 DIAGNOSIS — M545 Low back pain, unspecified: Secondary | ICD-10-CM

## 2010-10-01 DIAGNOSIS — M5126 Other intervertebral disc displacement, lumbar region: Secondary | ICD-10-CM | POA: Insufficient documentation

## 2010-10-01 DIAGNOSIS — M5137 Other intervertebral disc degeneration, lumbosacral region: Secondary | ICD-10-CM | POA: Insufficient documentation

## 2010-10-14 ENCOUNTER — Ambulatory Visit (INDEPENDENT_AMBULATORY_CARE_PROVIDER_SITE_OTHER): Payer: Medicare Other | Admitting: *Deleted

## 2010-10-14 DIAGNOSIS — I4891 Unspecified atrial fibrillation: Secondary | ICD-10-CM

## 2010-10-14 MED ORDER — DILTIAZEM HCL 120 MG PO CP24: 120.0000 mg | ORAL_CAPSULE | Freq: Every day | ORAL | Status: DC

## 2010-11-11 ENCOUNTER — Ambulatory Visit (INDEPENDENT_AMBULATORY_CARE_PROVIDER_SITE_OTHER): Payer: Medicare Other | Admitting: *Deleted

## 2010-11-11 DIAGNOSIS — Z7901 Long term (current) use of anticoagulants: Secondary | ICD-10-CM | POA: Insufficient documentation

## 2010-11-11 DIAGNOSIS — I4891 Unspecified atrial fibrillation: Secondary | ICD-10-CM

## 2010-11-11 LAB — POCT INR: INR: 2.3

## 2010-12-09 ENCOUNTER — Ambulatory Visit (INDEPENDENT_AMBULATORY_CARE_PROVIDER_SITE_OTHER): Payer: Medicare Other | Admitting: *Deleted

## 2010-12-09 DIAGNOSIS — I4891 Unspecified atrial fibrillation: Secondary | ICD-10-CM

## 2010-12-09 LAB — POCT INR: INR: 2.8

## 2010-12-27 ENCOUNTER — Other Ambulatory Visit (HOSPITAL_COMMUNITY): Payer: Self-pay | Admitting: Family Medicine

## 2010-12-27 DIAGNOSIS — Z139 Encounter for screening, unspecified: Secondary | ICD-10-CM

## 2010-12-28 ENCOUNTER — Other Ambulatory Visit: Payer: Self-pay | Admitting: Cardiology

## 2011-01-01 ENCOUNTER — Ambulatory Visit (HOSPITAL_COMMUNITY)
Admission: RE | Admit: 2011-01-01 | Discharge: 2011-01-01 | Disposition: A | Discharge status: Home / Self Care | Payer: Medicare Other | Source: Ambulatory Visit | Attending: Family Medicine | Admitting: Family Medicine

## 2011-01-01 DIAGNOSIS — Z1231 Encounter for screening mammogram for malignant neoplasm of breast: Secondary | ICD-10-CM | POA: Insufficient documentation

## 2011-01-01 DIAGNOSIS — Z139 Encounter for screening, unspecified: Secondary | ICD-10-CM

## 2011-01-02 ENCOUNTER — Ambulatory Visit (HOSPITAL_COMMUNITY): Payer: Medicare Other

## 2011-01-06 ENCOUNTER — Ambulatory Visit (INDEPENDENT_AMBULATORY_CARE_PROVIDER_SITE_OTHER): Payer: Medicare Other | Admitting: *Deleted

## 2011-01-06 DIAGNOSIS — I4891 Unspecified atrial fibrillation: Secondary | ICD-10-CM

## 2011-01-06 LAB — POCT INR: INR: 2.7

## 2011-01-28 LAB — COMPREHENSIVE METABOLIC PANEL
Alkaline Phosphatase: 60
BUN: 20
CO2: 22
Chloride: 105
Glucose, Bld: 129 — ABNORMAL HIGH
Potassium: 3.7
Total Bilirubin: 0.9

## 2011-01-28 LAB — CBC
HCT: 40.1
Hemoglobin: 13.4
WBC: 10.6 — ABNORMAL HIGH

## 2011-01-28 LAB — POCT CARDIAC MARKERS
CKMB, poc: <1 — ABNORMAL LOW
Troponin i, poc: <0.05
Troponin i, poc: <0.05

## 2011-01-28 LAB — DIFFERENTIAL
Basophils Absolute: 0
Basophils Relative: 0
Monocytes Absolute: 0.3
Neutro Abs: 9.4 — ABNORMAL HIGH
Neutrophils Relative %: 89 — ABNORMAL HIGH

## 2011-01-28 LAB — URINALYSIS, ROUTINE W REFLEX MICROSCOPIC
Glucose, UA: NEGATIVE
pH: 5.5

## 2011-01-28 LAB — URINE MICROSCOPIC-ADD ON

## 2011-01-28 LAB — LIPASE, BLOOD: Lipase: 14

## 2011-01-31 LAB — URINE CULTURE

## 2011-01-31 LAB — DIFFERENTIAL
Eosinophils Absolute: 0.2
Eosinophils Relative: 1
Lymphocytes Relative: 17
Lymphs Abs: 2.5
Monocytes Absolute: 0.6

## 2011-01-31 LAB — CBC
HCT: 45.1
Hemoglobin: 14.8
MCV: 89.9
Platelets: 324
WBC: 15 — ABNORMAL HIGH

## 2011-01-31 LAB — BASIC METABOLIC PANEL
BUN: 15
Chloride: 104
Glucose, Bld: 147 — ABNORMAL HIGH
Potassium: 3.8
Sodium: 137

## 2011-02-03 ENCOUNTER — Ambulatory Visit (INDEPENDENT_AMBULATORY_CARE_PROVIDER_SITE_OTHER): Payer: Medicare Other | Admitting: *Deleted

## 2011-02-03 DIAGNOSIS — I4891 Unspecified atrial fibrillation: Secondary | ICD-10-CM

## 2011-02-03 LAB — POCT INR: INR: 3.1

## 2011-02-03 MED ORDER — DILTIAZEM HCL 120 MG PO CP24: 120.0000 mg | ORAL_CAPSULE | Freq: Every day | ORAL | Status: DC

## 2011-02-03 MED ORDER — WARFARIN SODIUM 2.5 MG PO TABS: 2.5000 mg | ORAL_TABLET | Freq: Every day | ORAL | Status: DC

## 2011-03-03 ENCOUNTER — Ambulatory Visit (INDEPENDENT_AMBULATORY_CARE_PROVIDER_SITE_OTHER): Payer: Medicare Other | Admitting: *Deleted

## 2011-03-03 DIAGNOSIS — I4891 Unspecified atrial fibrillation: Secondary | ICD-10-CM

## 2011-03-03 DIAGNOSIS — Z7901 Long term (current) use of anticoagulants: Secondary | ICD-10-CM

## 2011-03-03 LAB — POCT INR: INR: 2.7

## 2011-03-31 ENCOUNTER — Ambulatory Visit (INDEPENDENT_AMBULATORY_CARE_PROVIDER_SITE_OTHER): Payer: Medicare Other | Admitting: *Deleted

## 2011-03-31 DIAGNOSIS — Z7901 Long term (current) use of anticoagulants: Secondary | ICD-10-CM

## 2011-03-31 DIAGNOSIS — I4891 Unspecified atrial fibrillation: Secondary | ICD-10-CM

## 2011-03-31 LAB — POCT INR: INR: 2.3

## 2011-04-24 ENCOUNTER — Encounter: Payer: Self-pay | Admitting: Cardiology

## 2011-04-28 ENCOUNTER — Ambulatory Visit (INDEPENDENT_AMBULATORY_CARE_PROVIDER_SITE_OTHER): Payer: Medicare Other | Admitting: *Deleted

## 2011-04-28 DIAGNOSIS — I4891 Unspecified atrial fibrillation: Secondary | ICD-10-CM

## 2011-04-28 DIAGNOSIS — Z7901 Long term (current) use of anticoagulants: Secondary | ICD-10-CM

## 2011-04-28 LAB — POCT INR: INR: 2.5

## 2011-05-16 ENCOUNTER — Ambulatory Visit: Payer: Medicare Other | Admitting: Cardiology

## 2011-05-26 ENCOUNTER — Ambulatory Visit (INDEPENDENT_AMBULATORY_CARE_PROVIDER_SITE_OTHER): Payer: Medicare Other | Admitting: *Deleted

## 2011-05-26 ENCOUNTER — Encounter: Payer: Self-pay | Admitting: Cardiology

## 2011-05-26 ENCOUNTER — Ambulatory Visit (INDEPENDENT_AMBULATORY_CARE_PROVIDER_SITE_OTHER): Payer: Medicare Other | Admitting: Cardiology

## 2011-05-26 ENCOUNTER — Encounter: Payer: Medicare Other | Admitting: *Deleted

## 2011-05-26 VITALS — BP 134/84 | HR 70 | Ht 62.0 in | Wt 158.0 lb

## 2011-05-26 DIAGNOSIS — E785 Hyperlipidemia, unspecified: Secondary | ICD-10-CM

## 2011-05-26 DIAGNOSIS — I4891 Unspecified atrial fibrillation: Secondary | ICD-10-CM

## 2011-05-26 DIAGNOSIS — I10 Essential (primary) hypertension: Secondary | ICD-10-CM

## 2011-05-26 DIAGNOSIS — Z7901 Long term (current) use of anticoagulants: Secondary | ICD-10-CM

## 2011-05-26 DIAGNOSIS — I480 Paroxysmal atrial fibrillation: Secondary | ICD-10-CM

## 2011-05-26 LAB — POCT INR: INR: 2.4

## 2011-05-26 NOTE — Assessment & Plan Note (Signed)
Excellent results with very low-dose statin therapy, which will be continued.

## 2011-05-26 NOTE — Assessment & Plan Note (Addendum)
Atrial fibrillation is likely now persistent or permanent.  We will continue anticoagulation indefinitely due to a substantial risk of thromboembolism and current rate control medication, which is effective.

## 2011-05-26 NOTE — Progress Notes (Signed)
Patient ID: Stacie Huang, female   DOB: 12/01/1940, 71 y.o.   MRN: [id number] HPI: Scheduled return visit for this very nice woman with long-standing atrial fibrillation and hypertension.  Since her last visit, she has done quite well.  She notes no dyspnea, chest discomfort, syncope or falls.  She experiences mild palpitations when she exerts herself.  She has developed no new medical problems nor has she required urgent medical care.  Her primary care provider reported good lab results to her 3 months ago.  Prior to Admission medications   Medication Sig Start Date End Date Taking? Authorizing Provider  atenolol (TENORMIN) 100 MG tablet Take 50 mg by mouth daily.     Yes Historical Provider, MD  Calcium Carbonate (CALCIUM 600) 1500 MG TABS Take 1 tablet by mouth daily.     Yes Historical Provider, MD  cetirizine (ZYRTEC) 10 MG tablet Take 10 mg by mouth daily.     Yes Historical Provider, MD  Cholecalciferol (VITAMIN D) 400 UNITS capsule Take 400 Units by mouth daily.     Yes Historical Provider, MD  clobetasol cream (TEMOVATE) 0.05 % Apply topically as needed.     Yes Historical Provider, MD  diltiazem (DILACOR XR) 120 MG 24 hr capsule Take 1 capsule (120 mg total) by mouth daily. 02/03/11  Yes Gerrit Friends. Torre Pikus, MD  estrogens, conjugated, (PREMARIN) 0.3 MG tablet Take 0.3 mg by mouth daily. Take daily for 21 days then do not take for 7 days.    Yes Historical Provider, MD  fluticasone (FLONASE) 50 MCG/ACT nasal spray Place 2 sprays into the nose daily.     Yes Historical Provider, MD  LORazepam (ATIVAN) 1 MG tablet Take 1 mg by mouth every 8 (eight) hours.     Yes Historical Provider, MD  losartan (COZAAR) 50 MG tablet Take 50 mg by mouth daily.     Yes Historical Provider, MD  omeprazole (PRILOSEC) 40 MG capsule Take 40 mg by mouth daily.     Yes Historical Provider, MD  simvastatin (ZOCOR) 10 MG tablet Take 10 mg by mouth at bedtime.     Yes Historical Provider, MD  warfarin (COUMADIN) 2.5 MG  tablet Take 1 tablet (2.5 mg total) by mouth daily. Take 1/2 tablet daily except 1 tablet on Saturday or as directed 02/03/11  Yes Gerrit Friends. Dietrich Pates, MD    Allergies  Allergen Reactions  . Iohexol      Code: HIVES   . Sulfa Antibiotics   Past medical history, social history, and family history reviewed and updated.  ROS: Denies orthopnea, PND, pedal edema, cough, sputum production.  PHYSICAL EXAM: BP 134/84  Pulse 70  Ht 5\' 2"  (1.575 m)  Wt 71.668 kg (158 lb)  BMI 28.90 kg/m2  General-Well developed; no acute distress Body habitus-mildly overweight Neck-No JVD; no carotid bruits Lungs-clear lung fields; resonant to percussion Cardiovascular-normal PMI; normal S1 and S2; irregular rhythm Abdomen-normal bowel sounds; soft and non-tender without masses or organomegaly Musculoskeletal-No deformities, no cyanosis or clubbing Neurologic-Normal cranial nerves; symmetric strength and tone Skin-Warm, no significant lesions; nonpigmented mole over the left for her Extremities-distal pulses intact; no edema  EKG: Atrial fibrillation with controlled ventricular rate of 85 bpm; minor nonspecific ST segment abnormality.  No previous tracing for comparison.  ASSESSMENT AND PLAN:  [place] Bing, MD 05/26/2011 3:19 PM

## 2011-05-26 NOTE — Assessment & Plan Note (Signed)
Blood pressure control is good with current 3 drug regimen.

## 2011-05-26 NOTE — Patient Instructions (Signed)
Your physician recommends that you schedule a follow-up appointment in: 12 months  Stools for blood x 3 and return to the office as soon as possible

## 2011-05-26 NOTE — Assessment & Plan Note (Signed)
Stable and therapeutic INRs.  CBC and stool Hemoccults will be monitored to exclude occult GI blood loss.

## 2011-06-02 ENCOUNTER — Encounter (INDEPENDENT_AMBULATORY_CARE_PROVIDER_SITE_OTHER): Payer: Medicare Other | Admitting: *Deleted

## 2011-06-02 DIAGNOSIS — Z7901 Long term (current) use of anticoagulants: Secondary | ICD-10-CM

## 2011-06-03 ENCOUNTER — Other Ambulatory Visit: Payer: Self-pay

## 2011-06-03 DIAGNOSIS — Z7901 Long term (current) use of anticoagulants: Secondary | ICD-10-CM

## 2011-06-05 ENCOUNTER — Encounter: Payer: Self-pay | Admitting: Cardiology

## 2011-06-09 ENCOUNTER — Encounter: Payer: Self-pay | Admitting: Cardiology

## 2011-06-12 ENCOUNTER — Other Ambulatory Visit: Payer: Self-pay | Admitting: Cardiology

## 2011-07-07 ENCOUNTER — Ambulatory Visit (INDEPENDENT_AMBULATORY_CARE_PROVIDER_SITE_OTHER): Payer: Medicare Other | Admitting: *Deleted

## 2011-07-07 DIAGNOSIS — I4891 Unspecified atrial fibrillation: Secondary | ICD-10-CM

## 2011-07-07 LAB — POCT INR: INR: 2.3

## 2011-07-07 NOTE — Patient Instructions (Signed)
Continue same regimen and return in 1 month

## 2011-08-11 ENCOUNTER — Ambulatory Visit (INDEPENDENT_AMBULATORY_CARE_PROVIDER_SITE_OTHER): Payer: Medicare Other | Admitting: *Deleted

## 2011-08-11 DIAGNOSIS — Z7901 Long term (current) use of anticoagulants: Secondary | ICD-10-CM

## 2011-09-08 ENCOUNTER — Ambulatory Visit (INDEPENDENT_AMBULATORY_CARE_PROVIDER_SITE_OTHER): Payer: Medicare Other | Admitting: *Deleted

## 2011-09-08 DIAGNOSIS — Z7901 Long term (current) use of anticoagulants: Secondary | ICD-10-CM

## 2011-10-06 ENCOUNTER — Ambulatory Visit (INDEPENDENT_AMBULATORY_CARE_PROVIDER_SITE_OTHER): Payer: Medicare Other | Admitting: *Deleted

## 2011-10-06 DIAGNOSIS — Z7901 Long term (current) use of anticoagulants: Secondary | ICD-10-CM

## 2011-11-03 ENCOUNTER — Ambulatory Visit (INDEPENDENT_AMBULATORY_CARE_PROVIDER_SITE_OTHER): Payer: Medicare Other | Admitting: *Deleted

## 2011-11-03 DIAGNOSIS — Z7901 Long term (current) use of anticoagulants: Secondary | ICD-10-CM

## 2011-11-03 LAB — POCT INR: INR: 2.7

## 2011-11-20 ENCOUNTER — Other Ambulatory Visit: Payer: Self-pay | Admitting: Cardiology

## 2011-12-17 ENCOUNTER — Ambulatory Visit (INDEPENDENT_AMBULATORY_CARE_PROVIDER_SITE_OTHER): Payer: Medicare Other | Admitting: *Deleted

## 2011-12-17 DIAGNOSIS — Z7901 Long term (current) use of anticoagulants: Secondary | ICD-10-CM

## 2011-12-17 LAB — POCT INR: INR: 2.1

## 2012-01-02 ENCOUNTER — Other Ambulatory Visit (HOSPITAL_COMMUNITY): Payer: Self-pay | Admitting: Family Medicine

## 2012-01-02 DIAGNOSIS — Z139 Encounter for screening, unspecified: Secondary | ICD-10-CM

## 2012-01-09 ENCOUNTER — Ambulatory Visit (HOSPITAL_COMMUNITY)
Admission: RE | Admit: 2012-01-09 | Discharge: 2012-01-09 | Disposition: A | Discharge status: Home / Self Care | Payer: Medicare Other | Source: Ambulatory Visit | Attending: Family Medicine | Admitting: Family Medicine

## 2012-01-09 DIAGNOSIS — Z139 Encounter for screening, unspecified: Secondary | ICD-10-CM

## 2012-01-09 DIAGNOSIS — Z1231 Encounter for screening mammogram for malignant neoplasm of breast: Secondary | ICD-10-CM | POA: Insufficient documentation

## 2012-01-26 ENCOUNTER — Ambulatory Visit (INDEPENDENT_AMBULATORY_CARE_PROVIDER_SITE_OTHER): Payer: Medicare Other | Admitting: *Deleted

## 2012-01-26 DIAGNOSIS — Z7901 Long term (current) use of anticoagulants: Secondary | ICD-10-CM

## 2012-01-26 LAB — POCT INR: INR: 2.2

## 2012-03-08 ENCOUNTER — Ambulatory Visit (INDEPENDENT_AMBULATORY_CARE_PROVIDER_SITE_OTHER): Payer: Medicare Other | Admitting: *Deleted

## 2012-03-08 DIAGNOSIS — Z7901 Long term (current) use of anticoagulants: Secondary | ICD-10-CM

## 2012-03-08 MED ORDER — WARFARIN SODIUM 2.5 MG PO TABS: ORAL_TABLET | ORAL | Status: DC

## 2012-04-19 ENCOUNTER — Ambulatory Visit (INDEPENDENT_AMBULATORY_CARE_PROVIDER_SITE_OTHER): Payer: Medicare Other | Admitting: *Deleted

## 2012-04-19 DIAGNOSIS — Z7901 Long term (current) use of anticoagulants: Secondary | ICD-10-CM

## 2012-04-19 LAB — POCT INR: INR: 2.3

## 2012-05-25 ENCOUNTER — Ambulatory Visit: Payer: Medicare Other | Admitting: Cardiology

## 2012-05-31 ENCOUNTER — Ambulatory Visit (INDEPENDENT_AMBULATORY_CARE_PROVIDER_SITE_OTHER): Payer: Medicare Other | Admitting: *Deleted

## 2012-05-31 ENCOUNTER — Encounter: Payer: Self-pay | Admitting: Adult Health

## 2012-05-31 ENCOUNTER — Ambulatory Visit (INDEPENDENT_AMBULATORY_CARE_PROVIDER_SITE_OTHER): Payer: Medicare Other | Admitting: Adult Health

## 2012-05-31 VITALS — BP 130/76 | HR 74 | Ht 62.0 in | Wt 160.0 lb

## 2012-05-31 DIAGNOSIS — I10 Essential (primary) hypertension: Secondary | ICD-10-CM

## 2012-05-31 DIAGNOSIS — I4891 Unspecified atrial fibrillation: Secondary | ICD-10-CM

## 2012-05-31 DIAGNOSIS — Z7901 Long term (current) use of anticoagulants: Secondary | ICD-10-CM

## 2012-05-31 LAB — POCT INR: INR: 2.4

## 2012-05-31 MED ORDER — DILTIAZEM HCL ER 120 MG PO CP24: 120.0000 mg | ORAL_CAPSULE | Freq: Every day | ORAL | Status: DC

## 2012-05-31 NOTE — Assessment & Plan Note (Signed)
Excellent BP control presently. She is to continue cozaar 50 mg daily. Labs need to be completed for kidney fx. She states that she sees Dr. Sudie Bailey next week and labs will be completed at that time.

## 2012-05-31 NOTE — Progress Notes (Deleted)
Name: Stacie Huang    DOB: Jul 06, 1940  Age: 72 y.o.  MR#: [id number]       PCP:  [name], MD      Insurance: @PAYORNAME @   CC:   No chief complaint on file.   VS BP 130/76  Pulse 74  Ht 5\' 2"  (1.575 m)  Wt 160 lb (72.576 kg)  BMI 29.26 kg/m2  Weights Current Weight  05/31/12 160 lb (72.576 kg)  05/26/11 158 lb (71.668 kg)  04/30/10 163 lb (73.936 kg)    Blood Pressure  BP Readings from Last 3 Encounters:  05/31/12 130/76  05/26/11 134/84  04/30/10 146/88     Admit date:  (Not on file) Last encounter with RMR:  Visit date not found   Allergy Allergies  Allergen Reactions  . Iohexol      Code: HIVES   . Sulfa Antibiotics     Current Outpatient Prescriptions  Medication Sig Dispense Refill  . atenolol (TENORMIN) 100 MG tablet Take 50 mg by mouth daily.        . Calcium Carbonate (CALCIUM 600) 1500 MG TABS Take 1 tablet by mouth daily.        . cetirizine (ZYRTEC) 10 MG tablet Take 10 mg by mouth daily.        . Cholecalciferol (VITAMIN D) 400 UNITS capsule Take 400 Units by mouth daily.        . clobetasol cream (TEMOVATE) 0.05 % Apply topically as needed.        . diltiazem (CARDIZEM CD) 120 MG 24 hr capsule TAKE 1 CAPSULE BY MOUTH EVERY DAY.  30 capsule  11  . diltiazem (DILACOR XR) 120 MG 24 hr capsule Take 1 capsule (120 mg total) by mouth daily.  30 capsule  3  . fluticasone (FLONASE) 50 MCG/ACT nasal spray Place 2 sprays into the nose daily.        Marland Kitchen LORazepam (ATIVAN) 1 MG tablet Take 1 mg by mouth every 8 (eight) hours.        Marland Kitchen losartan (COZAAR) 50 MG tablet Take 50 mg by mouth daily.        Marland Kitchen omeprazole (PRILOSEC) 40 MG capsule Take 40 mg by mouth daily.        . simvastatin (ZOCOR) 10 MG tablet Take 10 mg by mouth at bedtime.        Marland Kitchen warfarin (COUMADIN) 2.5 MG tablet Take 1/2 tablet daily except 1 tablet on Saturdays  45 tablet  3    Discontinued Meds:    Medications Discontinued During This Encounter  Medication Reason  . estrogens,  conjugated, (PREMARIN) 0.3 MG tablet Error    Patient Active Problem List  Diagnosis  . Hyperlipidemia  . Chronic anticoagulation  . Atrial fibrillation  . Hypertension    LABS Anti-coag visit on 04/19/2012  Component Date Value  . INR 04/19/2012 2.3   Anti-coag visit on 03/08/2012  Component Date Value  . INR 03/08/2012 2.7      Results for this Opt Visit:     Results for orders placed in visit on 04/19/12  POCT INR      Component Value Range   INR 2.3      EKG Orders placed in visit on 05/31/12  . EKG 12-LEAD     Prior Assessment and Plan Problem List as of 05/31/2012            Cardiology Problems   Hyperlipidemia   Last Assessment & Plan Note  05/26/2011 Office Visit Signed 05/26/2011  3:58 PM by Kathlen Brunswick, MD    Excellent results with very low-dose statin therapy, which will be continued.    Atrial fibrillation   Last Assessment & Plan Note   05/26/2011 Office Visit Addendum 05/28/2011  9:14 AM by Kathlen Brunswick, MD    Atrial fibrillation is likely now persistent or permanent.  We will continue anticoagulation indefinitely due to a substantial risk of thromboembolism and current rate control medication, which is effective.    Hypertension   Last Assessment & Plan Note   05/26/2011 Office Visit Signed 05/26/2011  3:58 PM by Kathlen Brunswick, MD    Blood pressure control is good with current 3 drug regimen.      Other   Chronic anticoagulation   Last Assessment & Plan Note   05/26/2011 Office Visit Signed 05/26/2011  3:57 PM by Kathlen Brunswick, MD    Stable and therapeutic INRs.  CBC and stool Hemoccults will be monitored to exclude occult GI blood loss.        Imaging: No results found.   FRS Calculation: Score not calculated. Missing: Total Cholesterol

## 2012-05-31 NOTE — Progress Notes (Deleted)
   HPI: Stacie Huang is a 72 y/o patient of Dr.Rothbart we are seeing for ongoing assessment and   Allergies  Allergen Reactions  . Iohexol      Code: HIVES   . Sulfa Antibiotics     Current Outpatient Prescriptions  Medication Sig Dispense Refill  . atenolol (TENORMIN) 100 MG tablet Take 50 mg by mouth daily.        . Calcium Carbonate (CALCIUM 600) 1500 MG TABS Take 1 tablet by mouth daily.        . cetirizine (ZYRTEC) 10 MG tablet Take 10 mg by mouth daily.        . Cholecalciferol (VITAMIN D) 400 UNITS capsule Take 400 Units by mouth daily.        . clobetasol cream (TEMOVATE) 0.05 % Apply topically as needed.        . diltiazem (CARDIZEM CD) 120 MG 24 hr capsule TAKE 1 CAPSULE BY MOUTH EVERY DAY.  30 capsule  11  . fluticasone (FLONASE) 50 MCG/ACT nasal spray Place 2 sprays into the nose daily.        Marland Kitchen LORazepam (ATIVAN) 1 MG tablet Take 1 mg by mouth every 8 (eight) hours.        Marland Kitchen losartan (COZAAR) 50 MG tablet Take 50 mg by mouth daily.        Marland Kitchen omeprazole (PRILOSEC) 40 MG capsule Take 40 mg by mouth daily.        . simvastatin (ZOCOR) 10 MG tablet Take 10 mg by mouth at bedtime.        Marland Kitchen warfarin (COUMADIN) 2.5 MG tablet Take 1/2 tablet daily except 1 tablet on Saturdays  45 tablet  3  . diltiazem (DILACOR XR) 120 MG 24 hr capsule Take 1 capsule (120 mg total) by mouth daily.  30 capsule  11    Past Medical History  Diagnosis Date  . Hypertension   . Paroxysmal atrial fibrillation   . Chronic anticoagulation   . Hyperlipidemia     Past Surgical History  Procedure Date  . Abdominal hysterectomy   . Cholecystectomy     ROS: PHYSICAL EXAM BP 130/76  Pulse 74  Ht 5\' 2"  (1.575 m)  Wt 160 lb (72.576 kg)  BMI 29.26 kg/m2  EKG:  ASSESSMENT AND PLAN

## 2012-05-31 NOTE — Patient Instructions (Addendum)
Your physician recommends that you schedule a follow-up appointment in: 12 months.  

## 2012-05-31 NOTE — Assessment & Plan Note (Signed)
Good rate control on diltiazem. She is stable with INR labs. Will continue current regimen. She see Dr Sudie Bailey soon. She is due for colonoscopy this year.

## 2012-05-31 NOTE — Progress Notes (Signed)
   HPI: Stacie Huang is a 72 y/o patient of Dr. Dietrich Pates we are following for ongoing annual assessment and treatment for Atrial fibrillation, chronic coumadin tx, She comes today without complaint of chest pain, DOE, weakness, palpitations, or syncope. She is medically compliant. She is seeing coumadin clinic RN today as well. She is followed by Dr. Sudie Bailey who has periodic labs drawn. She has not required urgent care or hospitalization.   Allergies  Allergen Reactions  . Iohexol      Code: HIVES   . Sulfa Antibiotics     Current Outpatient Prescriptions  Medication Sig Dispense Refill  . atenolol (TENORMIN) 100 MG tablet Take 50 mg by mouth daily.        . Calcium Carbonate (CALCIUM 600) 1500 MG TABS Take 1 tablet by mouth daily.        . cetirizine (ZYRTEC) 10 MG tablet Take 10 mg by mouth daily.        . Cholecalciferol (VITAMIN D) 400 UNITS capsule Take 400 Units by mouth daily.        . clobetasol cream (TEMOVATE) 0.05 % Apply topically as needed.        . diltiazem (CARDIZEM CD) 120 MG 24 hr capsule TAKE 1 CAPSULE BY MOUTH EVERY DAY.  30 capsule  11  . fluticasone (FLONASE) 50 MCG/ACT nasal spray Place 2 sprays into the nose daily.        Marland Kitchen LORazepam (ATIVAN) 1 MG tablet Take 1 mg by mouth every 8 (eight) hours.        Marland Kitchen losartan (COZAAR) 50 MG tablet Take 50 mg by mouth daily.        Marland Kitchen omeprazole (PRILOSEC) 40 MG capsule Take 40 mg by mouth daily.        . simvastatin (ZOCOR) 10 MG tablet Take 10 mg by mouth at bedtime.        Marland Kitchen warfarin (COUMADIN) 2.5 MG tablet Take 1/2 tablet daily except 1 tablet on Saturdays  45 tablet  3  . diltiazem (DILACOR XR) 120 MG 24 hr capsule Take 1 capsule (120 mg total) by mouth daily.  30 capsule  11    Past Medical History  Diagnosis Date  . Hypertension   . Paroxysmal atrial fibrillation   . Chronic anticoagulation   . Hyperlipidemia     Past Surgical History  Procedure Date  . Abdominal hysterectomy   . Cholecystectomy     ROS:  Review of systems complete and found to be negative unless listed above  PHYSICAL EXAM BP 130/76  Pulse 74  Ht 5\' 2"  (1.575 m)  Wt 160 lb (72.576 kg)  BMI 29.26 kg/m2  General: Well developed, well nourished, in no acute distress Head: Eyes PERRLA, No xanthomas.   Normal cephalic and atramatic  Lungs: Clear bilaterally to auscultation and percussion. Heart: HRIR S1 S2, without MRG.  Pulses are 2+ & equal.            No carotid bruit. No JVD.  No abdominal bruits. No femoral bruits. Abdomen: Bowel sounds are positive, abdomen soft and non-tender without masses or                  Hernia's noted. Msk:  Back normal, normal gait. Normal strength and tone for age. Extremities: No clubbing, cyanosis or edema.  DP +1 Neuro: Alert and oriented X 3. Psych:  Good affect, responds appropriately  EKG: Atrial Fibrillation rate of 74 bpm.  ASSESSMENT AND PLAN

## 2012-06-07 ENCOUNTER — Other Ambulatory Visit (HOSPITAL_COMMUNITY): Payer: Self-pay | Admitting: Family Medicine

## 2012-06-07 ENCOUNTER — Ambulatory Visit (HOSPITAL_COMMUNITY)
Admission: RE | Admit: 2012-06-07 | Discharge: 2012-06-07 | Disposition: A | Discharge status: Home / Self Care | Payer: Medicare Other | Source: Ambulatory Visit | Attending: Family Medicine | Admitting: Family Medicine

## 2012-06-07 DIAGNOSIS — R079 Chest pain, unspecified: Secondary | ICD-10-CM | POA: Insufficient documentation

## 2012-07-19 ENCOUNTER — Ambulatory Visit (INDEPENDENT_AMBULATORY_CARE_PROVIDER_SITE_OTHER): Payer: Medicare Other | Admitting: *Deleted

## 2012-07-19 DIAGNOSIS — Z7901 Long term (current) use of anticoagulants: Secondary | ICD-10-CM

## 2012-08-18 ENCOUNTER — Telehealth: Payer: Self-pay | Admitting: Adult Health

## 2012-08-18 NOTE — Telephone Encounter (Signed)
PT WAS GIVEN ANTIBIOTIC TO START TODAY CIPRO. SHE STOPPED IN TO SEE HOW SHE NEEDS TO ADJUST COUMADIN

## 2012-08-18 NOTE — Telephone Encounter (Signed)
Started on cipro today for ear infection.  Last INR was 2.1.  Told pt to decrease coumadin to 1/2 tablet daily till finished with Abx then resume 1/2 tablet daily except 1 tablet on Saturdays.  INR appt on 08/30/12  She verbalized understanding.

## 2012-08-30 ENCOUNTER — Ambulatory Visit (INDEPENDENT_AMBULATORY_CARE_PROVIDER_SITE_OTHER): Payer: Medicare Other | Admitting: *Deleted

## 2012-08-30 DIAGNOSIS — Z7901 Long term (current) use of anticoagulants: Secondary | ICD-10-CM

## 2012-09-22 ENCOUNTER — Ambulatory Visit (INDEPENDENT_AMBULATORY_CARE_PROVIDER_SITE_OTHER): Payer: Medicare Other | Admitting: *Deleted

## 2012-09-22 DIAGNOSIS — Z7901 Long term (current) use of anticoagulants: Secondary | ICD-10-CM

## 2012-10-18 ENCOUNTER — Ambulatory Visit (INDEPENDENT_AMBULATORY_CARE_PROVIDER_SITE_OTHER): Payer: Medicare Other | Admitting: *Deleted

## 2012-10-18 DIAGNOSIS — Z7901 Long term (current) use of anticoagulants: Secondary | ICD-10-CM

## 2012-10-31 ENCOUNTER — Emergency Department (HOSPITAL_COMMUNITY)
Admission: EM | Admit: 2012-10-31 | Discharge: 2012-10-31 | Disposition: A | Discharge status: Home / Self Care | Payer: Medicare Other | Attending: Emergency Medicine | Admitting: Emergency Medicine

## 2012-10-31 ENCOUNTER — Emergency Department (HOSPITAL_COMMUNITY): Payer: Medicare Other

## 2012-10-31 ENCOUNTER — Encounter (HOSPITAL_COMMUNITY): Payer: Self-pay | Admitting: *Deleted

## 2012-10-31 DIAGNOSIS — I4891 Unspecified atrial fibrillation: Secondary | ICD-10-CM | POA: Insufficient documentation

## 2012-10-31 DIAGNOSIS — M545 Low back pain, unspecified: Secondary | ICD-10-CM | POA: Insufficient documentation

## 2012-10-31 DIAGNOSIS — E785 Hyperlipidemia, unspecified: Secondary | ICD-10-CM | POA: Insufficient documentation

## 2012-10-31 DIAGNOSIS — Z79899 Other long term (current) drug therapy: Secondary | ICD-10-CM | POA: Insufficient documentation

## 2012-10-31 DIAGNOSIS — Z7901 Long term (current) use of anticoagulants: Secondary | ICD-10-CM | POA: Insufficient documentation

## 2012-10-31 DIAGNOSIS — Z862 Personal history of diseases of the blood and blood-forming organs and certain disorders involving the immune mechanism: Secondary | ICD-10-CM | POA: Insufficient documentation

## 2012-10-31 DIAGNOSIS — I10 Essential (primary) hypertension: Secondary | ICD-10-CM | POA: Insufficient documentation

## 2012-10-31 DIAGNOSIS — M549 Dorsalgia, unspecified: Secondary | ICD-10-CM

## 2012-10-31 HISTORY — DX: Gastro-esophageal reflux disease without esophagitis: K21.9

## 2012-10-31 MED ORDER — CYCLOBENZAPRINE HCL 10 MG PO TABS: 10.0000 mg | ORAL_TABLET | Freq: Two times a day (BID) | ORAL | Status: DC | PRN

## 2012-10-31 MED ORDER — OXYCODONE-ACETAMINOPHEN 5-325 MG PO TABS: 1.0000 | ORAL_TABLET | ORAL | Status: DC | PRN

## 2012-10-31 MED ORDER — KETOROLAC TROMETHAMINE 60 MG/2ML IM SOLN
60.0000 mg | Freq: Once | INTRAMUSCULAR | Status: AC
  Administered 2012-10-31: 60 mg via INTRAMUSCULAR
  Filled 2012-10-31: qty 2

## 2012-10-31 NOTE — ED Provider Notes (Signed)
History  This chart was scribed for Donnetta Hutching, MD by Bennett Scrape, ED Scribe. This patient was seen in room APA17/[facility]17 and the patient's care was started at 8:53 AM.  CSN: [id number]  Arrival date & time 10/31/12  0851   First MD Initiated Contact with Patient 10/31/12 [phone]     Chief Complaint  Patient presents with  . Hip Pain    The history is provided by the patient. No language interpreter was used.    HPI Comments: Stacie Huang is a 72 y.o. female who presents to the Emergency Department complaining of 2 days of worsening of chronic right lower back pain that radiates into her right groin and down her right leg that has been present for the past year. She rates her pain an 8 out of 10 at rest and a 10 out of 10 with movement. She denies any recent injuries or falls. She states that she has been walking "bent over" since the pain has worsened. She reports taking Tylenol with slight relief. She admits that she has followed up with her PCP on the issue with no improvement. She denies having urinary incontinence, dysuria, nausea and weakness as associated symptoms. She is currently on warfarin for A. Fib.   PCP is Dr. Sudie Bailey.  Past Medical History  Diagnosis Date  . Hypertension   . Paroxysmal atrial fibrillation   . Chronic anticoagulation   . Hyperlipidemia    Past Surgical History  Procedure Laterality Date  . Abdominal hysterectomy    . Cholecystectomy     Family History  Problem Relation Age of Onset  . Heart disease Mother     Also 5 of 9 siblings  . Heart attack Father   . Cancer      2 siblings   History  Substance Use Topics  . Smoking status: Never Smoker   . Smokeless tobacco: Not on file  . Alcohol Use: Yes     Comment: No excessive use   No OB history provided.   Review of Systems  A complete 10 system review of systems was obtained and all systems are negative except as noted in the HPI and PMH.   Allergies  Iohexol and Sulfa  antibiotics  Home Medications   Current Outpatient Rx  Name  Route  Sig  Dispense  Refill  . atenolol (TENORMIN) 100 MG tablet   Oral   Take 50 mg by mouth daily.           . Calcium Carbonate (CALCIUM 600) 1500 MG TABS   Oral   Take 1 tablet by mouth daily.           . cetirizine (ZYRTEC) 10 MG tablet   Oral   Take 10 mg by mouth daily.           . Cholecalciferol (VITAMIN D) 400 UNITS capsule   Oral   Take 400 Units by mouth daily.           . clobetasol cream (TEMOVATE) 0.05 %   Topical   Apply topically as needed.           . diltiazem (CARDIZEM CD) 120 MG 24 hr capsule      TAKE 1 CAPSULE BY MOUTH EVERY DAY.   30 capsule   11   . diltiazem (DILACOR XR) 120 MG 24 hr capsule   Oral   Take 1 capsule (120 mg total) by mouth daily.   30 capsule   11   .  fluticasone (FLONASE) 50 MCG/ACT nasal spray   Nasal   Place 2 sprays into the nose daily.           Marland Kitchen LORazepam (ATIVAN) 1 MG tablet   Oral   Take 1 mg by mouth every 8 (eight) hours.           Marland Kitchen losartan (COZAAR) 50 MG tablet   Oral   Take 50 mg by mouth daily.           Marland Kitchen omeprazole (PRILOSEC) 40 MG capsule   Oral   Take 40 mg by mouth daily.           . simvastatin (ZOCOR) 10 MG tablet   Oral   Take 10 mg by mouth at bedtime.           Marland Kitchen warfarin (COUMADIN) 2.5 MG tablet      Take 1/2 tablet daily except 1 tablet on Saturdays   45 tablet   3    Triage Vitals: BP 158/72  Pulse 83  Temp(Src) 97.9 F (36.6 C) (Oral)  Resp 17  Ht 5\' 2"  (1.575 m)  Wt 154 lb (69.854 kg)  BMI 28.16 kg/m2  SpO2 96%  Physical Exam  Nursing note and vitals reviewed. Constitutional: She is oriented to person, place, and time. She appears well-developed and well-nourished.  HENT:  Head: Normocephalic and atraumatic.  Eyes: Conjunctivae and EOM are normal. Pupils are equal, round, and reactive to light.  Neck: Normal range of motion. Neck supple.  Cardiovascular: Normal rate, regular rhythm  and normal heart sounds.   Pulmonary/Chest: Effort normal and breath sounds normal.  Abdominal: Soft. Bowel sounds are normal.  Musculoskeletal: Normal range of motion.  No tenderness to the lower back, negative straight leg raise, full ROM without pain  Neurological: She is alert and oriented to person, place, and time.  Skin: Skin is warm and dry.  Psychiatric: She has a normal mood and affect.    ED Course  Procedures (including critical care time)  Medications  ketorolac (TORADOL) injection 60 mg (not administered)    DIAGNOSTIC STUDIES: Oxygen Saturation is 96% on room air, normal by my interpretation.    COORDINATION OF CARE: 9:17 AM-Discussed treatment plan which includes pain medication and xray of the lumbar spine with pt at bedside and pt agreed to plan.   Labs Reviewed - No data to display  Dg Lumbar Spine Complete  10/31/2012   *RADIOLOGY REPORT*  Clinical Data: Low back pain  LUMBAR SPINE - COMPLETE 4+ VIEW  Comparison: 10/01/2010  Findings: There is a curvature of the lumbar spine which is convex towards the left.  Multilevel disc space narrowing and ventral endplate spurring is noted consistent with degenerative disc disease.  Chronic grade 1 anterolisthesis of L4-L5 is again identified.  Severe bilateral facet hypertrophy and degenerative changes noted. No acute fractures or subluxations identified.  IMPRESSION:  1.  No acute findings. 2.  Lumbar spondylosis noted.   Original Report Authenticated By: Signa Kell, M.D.    No diagnosis found.  MDM  No clinical evidence of AAA, urinary tract infection.  Plain films of lumbar spine show no acute findings. Discharge meds Percocet and Flexeril 10 mg  I personally performed the services described in this documentation, which was scribed in my presence. The recorded information has been reviewed and is accurate.    Donnetta Hutching, MD 10/31/12 1115

## 2012-10-31 NOTE — ED Notes (Addendum)
Patient c/o chronic R hip pain for 1 year, but since Friday pain has escalated to 8/10 at rest 10/10 with movement. Pain seems to begin just to R of lumbar spine radiates into hip and down R leg and into groin.  Has been taking Tylenol for pain w/slight relief.  No known injury.

## 2012-11-15 ENCOUNTER — Ambulatory Visit (INDEPENDENT_AMBULATORY_CARE_PROVIDER_SITE_OTHER): Payer: Medicare Other | Admitting: *Deleted

## 2012-11-15 DIAGNOSIS — Z7901 Long term (current) use of anticoagulants: Secondary | ICD-10-CM

## 2012-12-13 ENCOUNTER — Ambulatory Visit (INDEPENDENT_AMBULATORY_CARE_PROVIDER_SITE_OTHER): Payer: Medicare Other | Admitting: *Deleted

## 2012-12-13 DIAGNOSIS — Z7901 Long term (current) use of anticoagulants: Secondary | ICD-10-CM

## 2012-12-13 MED ORDER — WARFARIN SODIUM 2.5 MG PO TABS: 1.2500 mg | ORAL_TABLET | Freq: Every evening | ORAL | Status: DC

## 2012-12-20 ENCOUNTER — Encounter (HOSPITAL_COMMUNITY): Payer: Self-pay | Admitting: *Deleted

## 2012-12-20 ENCOUNTER — Emergency Department (HOSPITAL_COMMUNITY)
Admission: EM | Admit: 2012-12-20 | Discharge: 2012-12-20 | Disposition: A | Discharge status: Home / Self Care | Payer: Medicare Other | Attending: Emergency Medicine | Admitting: Emergency Medicine

## 2012-12-20 DIAGNOSIS — Z79899 Other long term (current) drug therapy: Secondary | ICD-10-CM | POA: Insufficient documentation

## 2012-12-20 DIAGNOSIS — K219 Gastro-esophageal reflux disease without esophagitis: Secondary | ICD-10-CM | POA: Insufficient documentation

## 2012-12-20 DIAGNOSIS — Y9289 Other specified places as the place of occurrence of the external cause: Secondary | ICD-10-CM | POA: Insufficient documentation

## 2012-12-20 DIAGNOSIS — I10 Essential (primary) hypertension: Secondary | ICD-10-CM | POA: Insufficient documentation

## 2012-12-20 DIAGNOSIS — I4891 Unspecified atrial fibrillation: Secondary | ICD-10-CM | POA: Insufficient documentation

## 2012-12-20 DIAGNOSIS — S61412A Laceration without foreign body of left hand, initial encounter: Secondary | ICD-10-CM

## 2012-12-20 DIAGNOSIS — Y939 Activity, unspecified: Secondary | ICD-10-CM | POA: Insufficient documentation

## 2012-12-20 DIAGNOSIS — Z7901 Long term (current) use of anticoagulants: Secondary | ICD-10-CM | POA: Insufficient documentation

## 2012-12-20 DIAGNOSIS — E785 Hyperlipidemia, unspecified: Secondary | ICD-10-CM | POA: Insufficient documentation

## 2012-12-20 DIAGNOSIS — W260XXA Contact with knife, initial encounter: Secondary | ICD-10-CM | POA: Insufficient documentation

## 2012-12-20 DIAGNOSIS — S61409A Unspecified open wound of unspecified hand, initial encounter: Secondary | ICD-10-CM | POA: Insufficient documentation

## 2012-12-20 DIAGNOSIS — Z23 Encounter for immunization: Secondary | ICD-10-CM | POA: Insufficient documentation

## 2012-12-20 MED ORDER — TETANUS-DIPHTH-ACELL PERTUSSIS 5-2.5-18.5 LF-MCG/0.5 IM SUSP
0.5000 mL | Freq: Once | INTRAMUSCULAR | Status: AC
  Administered 2012-12-20: 0.5 mL via INTRAMUSCULAR
  Filled 2012-12-20: qty 0.5

## 2012-12-20 NOTE — ED Provider Notes (Signed)
Medical screening examination/treatment/procedure(s) were performed by non-physician practitioner and as supervising physician I was immediately available for consultation/collaboration.  Tyneisha Hegeman, MD 12/20/12 1450 

## 2012-12-20 NOTE — ED Notes (Signed)
Laceration to lt palm of hand yesterday, with kitchen knife ,  Pt is on coumadin and has been oozing blood, pt has 2 bandaids over site

## 2012-12-20 NOTE — ED Provider Notes (Signed)
CSN: [id number]     Arrival date & time 12/20/12  1312 History     First MD Initiated Contact with Patient 12/20/12 1342     Chief Complaint  Patient presents with  . Extremity Laceration   (Consider location/radiation/quality/duration/timing/severity/associated sxs/prior Treatment) HPI Comments: Stacie Huang is a 72 y.o. Female presenting with laceration to the palm of her left hand which occurred from a kitchen paring knife yesterday.  She cleaned the wound and applied a dressing but it continues to bleed.  She is on coumadin for atrial fibrillation. She denies weakness or numbness distal to the injury site and has had to drainage other than blood.  She is not up to date with her tetanus.      The history is provided by the patient.    Past Medical History  Diagnosis Date  . Hypertension   . Paroxysmal atrial fibrillation   . Chronic anticoagulation   . Hyperlipidemia   . GERD (gastroesophageal reflux disease)    Past Surgical History  Procedure Laterality Date  . Abdominal hysterectomy    . Cholecystectomy     Family History  Problem Relation Age of Onset  . Heart disease Mother     Also 5 of 9 siblings  . Heart attack Father   . Cancer      2 siblings   History  Substance Use Topics  . Smoking status: Never Smoker   . Smokeless tobacco: Not on file  . Alcohol Use: No   OB History   Grav Para Term Preterm Abortions TAB SAB Ect Mult Living   2 2 2             Review of Systems  Constitutional: Negative for fever and chills.  HENT: Negative for facial swelling.   Respiratory: Negative for shortness of breath and wheezing.   Skin: Positive for wound.  Neurological: Negative for numbness.    Allergies  Iohexol and Sulfa antibiotics  Home Medications   Current Outpatient Rx  Name  Route  Sig  Dispense  Refill  . atenolol (TENORMIN) 100 MG tablet   Oral   Take 50 mg by mouth daily before breakfast.          . Calcium Carbonate (CALCIUM 600) 1500 MG  TABS   Oral   Take 1 tablet by mouth every morning.          . cetirizine (ZYRTEC) 10 MG tablet   Oral   Take 10 mg by mouth every evening.          . Cholecalciferol (VITAMIN D) 400 UNITS capsule   Oral   Take 400 Units by mouth every morning.          . clobetasol cream (TEMOVATE) 0.05 %   Topical   Apply 1 application topically at bedtime.          Marland Kitchen diltiazem (TIAZAC) 120 MG 24 hr capsule   Oral   Take 120 mg by mouth every morning.         . fluticasone (FLONASE) 50 MCG/ACT nasal spray   Nasal   Place 2 sprays into the nose at bedtime.          Marland Kitchen losartan (COZAAR) 50 MG tablet   Oral   Take 50 mg by mouth every morning.          Marland Kitchen omeprazole (PRILOSEC) 40 MG capsule   Oral   Take 40 mg by mouth every morning.          Marland Kitchen  simvastatin (ZOCOR) 10 MG tablet   Oral   Take 10 mg by mouth at bedtime.           Marland Kitchen warfarin (COUMADIN) 2.5 MG tablet   Oral   Take 0.5-1 tablets (1.25-2.5 mg total) by mouth every evening. Take 1/2 tablet daily except 1 tablet on Saturdays   30 tablet   6   . LORazepam (ATIVAN) 1 MG tablet   Oral   Take 1 mg by mouth daily as needed for anxiety (AND/OR FOR SLEEP).           BP 126/72  Pulse 90  Temp(Src) 97.6 F (36.4 C) (Oral)  Resp 18  Ht 5\' 2"  (1.575 m)  Wt 158 lb (71.668 kg)  BMI 28.89 kg/m2  SpO2 98% Physical Exam  Constitutional: She is oriented to person, place, and time. She appears well-developed and well-nourished.  HENT:  Head: Normocephalic.  Cardiovascular: Normal rate.   Pulmonary/Chest: Effort normal.  Musculoskeletal: She exhibits no tenderness.  Neurological: She is alert and oriented to person, place, and time. No sensory deficit.  Skin: Laceration noted.  Well appearing 1 cm approximated laceration left palm, no drainage, no bleeding at this time.  Distal sensation intact. She can make a fist,  Extend finger without pain or weakness.    ED Course   Procedures (including critical care  time)   LACERATION REPAIR Performed by: Burgess Amor Authorized by: Burgess Amor Consent: Verbal consent obtained. Risks and benefits: risks, benefits and alternatives were discussed Consent given by: patient Patient identity confirmed: provided demographic data Prepped and Draped in normal sterile fashion Wound explored  Laceration Location: left hand  Laceration Length: 1cm  No Foreign Bodies seen or palpated  Anesthesia: local infiltration  Local anesthetic: none Anesthetic total: none Irrigation method: syringe Amount of cleaning: standard  Skin closure: sterile strips   Number of sutures: 3  Technique: strips.  Patient tolerance: Patient tolerated the procedure well with no immediate complications.   Labs Reviewed - No data to display No results found. 1. Laceration of hand with delay in treatment, left, initial encounter     MDM  Prn f/u. Tetanus updated.  No sign of infection .  Burgess Amor, PA-C 12/20/12 1410

## 2012-12-20 NOTE — ED Notes (Signed)
Pt did not want to wait for d/c.

## 2013-01-05 ENCOUNTER — Other Ambulatory Visit (HOSPITAL_COMMUNITY): Payer: Self-pay | Admitting: Family Medicine

## 2013-01-05 DIAGNOSIS — Z139 Encounter for screening, unspecified: Secondary | ICD-10-CM

## 2013-01-10 ENCOUNTER — Ambulatory Visit (HOSPITAL_COMMUNITY)
Admission: RE | Admit: 2013-01-10 | Discharge: 2013-01-10 | Disposition: A | Discharge status: Home / Self Care | Payer: Medicare Other | Source: Ambulatory Visit | Attending: Family Medicine | Admitting: Family Medicine

## 2013-01-10 DIAGNOSIS — Z139 Encounter for screening, unspecified: Secondary | ICD-10-CM

## 2013-01-10 DIAGNOSIS — Z1231 Encounter for screening mammogram for malignant neoplasm of breast: Secondary | ICD-10-CM | POA: Insufficient documentation

## 2013-01-14 ENCOUNTER — Other Ambulatory Visit: Payer: Self-pay | Admitting: Family Medicine

## 2013-01-14 DIAGNOSIS — R928 Other abnormal and inconclusive findings on diagnostic imaging of breast: Secondary | ICD-10-CM

## 2013-01-24 ENCOUNTER — Ambulatory Visit (INDEPENDENT_AMBULATORY_CARE_PROVIDER_SITE_OTHER): Payer: Medicare Other | Admitting: *Deleted

## 2013-01-24 DIAGNOSIS — Z7901 Long term (current) use of anticoagulants: Secondary | ICD-10-CM

## 2013-01-24 LAB — POCT INR: INR: 2.2

## 2013-01-26 ENCOUNTER — Ambulatory Visit (HOSPITAL_COMMUNITY)
Admission: RE | Admit: 2013-01-26 | Discharge: 2013-01-26 | Disposition: A | Discharge status: Home / Self Care | Payer: Medicare Other | Source: Ambulatory Visit | Attending: Family Medicine | Admitting: Family Medicine

## 2013-01-26 ENCOUNTER — Other Ambulatory Visit: Payer: Self-pay | Admitting: Family Medicine

## 2013-01-26 DIAGNOSIS — R920 Mammographic microcalcification found on diagnostic imaging of breast: Secondary | ICD-10-CM | POA: Insufficient documentation

## 2013-01-26 DIAGNOSIS — R928 Other abnormal and inconclusive findings on diagnostic imaging of breast: Secondary | ICD-10-CM

## 2013-01-26 DIAGNOSIS — R921 Mammographic calcification found on diagnostic imaging of breast: Secondary | ICD-10-CM

## 2013-02-04 ENCOUNTER — Ambulatory Visit
Admission: RE | Admit: 2013-02-04 | Discharge: 2013-02-04 | Disposition: A | Discharge status: Home / Self Care | Payer: Medicare Other | Source: Ambulatory Visit | Attending: Family Medicine | Admitting: Family Medicine

## 2013-02-04 DIAGNOSIS — R921 Mammographic calcification found on diagnostic imaging of breast: Secondary | ICD-10-CM

## 2013-02-10 ENCOUNTER — Ambulatory Visit: Payer: Medicare Other | Admitting: Adult Health

## 2013-02-11 ENCOUNTER — Encounter: Payer: Self-pay | Admitting: *Deleted

## 2013-02-11 ENCOUNTER — Ambulatory Visit (INDEPENDENT_AMBULATORY_CARE_PROVIDER_SITE_OTHER): Payer: Medicare Other | Admitting: Adult Health

## 2013-02-11 ENCOUNTER — Encounter: Payer: Self-pay | Admitting: Adult Health

## 2013-02-11 VITALS — BP 128/73 | HR 85 | Ht 61.5 in | Wt 157.5 lb

## 2013-02-11 DIAGNOSIS — E785 Hyperlipidemia, unspecified: Secondary | ICD-10-CM

## 2013-02-11 DIAGNOSIS — I10 Essential (primary) hypertension: Secondary | ICD-10-CM

## 2013-02-11 DIAGNOSIS — I4891 Unspecified atrial fibrillation: Secondary | ICD-10-CM

## 2013-02-11 NOTE — Progress Notes (Signed)
HPI: Stacie Huang is a 72 year old former patient of Dr. Dietrich Pates we are following for ongoing annual assessment and treatment of atrial fibrillation on chronic Coumadin therapy. She was last in the office in February 2014 and was very stable. The patient is having planned surgery on her left breast, through Dr. [name] office, and need surgical evaluation prior to this.    She is currently symptomatic from a cardiac standpoint. Medically compliant, without complaints of chest pain or dyspnea on exertion.  Allergies  Allergen Reactions  . Iohexol Hives  . Sulfa Antibiotics Rash    Current Outpatient Prescriptions  Medication Sig Dispense Refill  . atenolol (TENORMIN) 100 MG tablet Take 50 mg by mouth daily before breakfast.       . Calcium Carbonate (CALCIUM 600) 1500 MG TABS Take 1 tablet by mouth every morning.       . cetirizine (ZYRTEC) 10 MG tablet Take 10 mg by mouth every evening.       . Cholecalciferol (VITAMIN D) 400 UNITS capsule Take 400 Units by mouth every morning.       . clobetasol cream (TEMOVATE) 0.05 % Apply 1 application topically at bedtime.       Marland Kitchen diltiazem (TIAZAC) 120 MG 24 hr capsule Take 120 mg by mouth every morning.      . fluticasone (FLONASE) 50 MCG/ACT nasal spray Place 2 sprays into the nose at bedtime.       Marland Kitchen LORazepam (ATIVAN) 1 MG tablet Take 1 mg by mouth daily as needed for anxiety (AND/OR FOR SLEEP).       Marland Kitchen losartan (COZAAR) 50 MG tablet Take 50 mg by mouth every morning.       Marland Kitchen omeprazole (PRILOSEC) 40 MG capsule Take 40 mg by mouth every morning.       . simvastatin (ZOCOR) 10 MG tablet Take 10 mg by mouth at bedtime.        Marland Kitchen warfarin (COUMADIN) 2.5 MG tablet Take 0.5-1 tablets (1.25-2.5 mg total) by mouth every evening. Take 1/2 tablet daily except 1 tablet on Saturdays  30 tablet  6   No current facility-administered medications for this visit.    Past Medical History  Diagnosis Date  . Hypertension   . Paroxysmal atrial fibrillation    . Chronic anticoagulation   . Hyperlipidemia   . GERD (gastroesophageal reflux disease)     Past Surgical History  Procedure Laterality Date  . Abdominal hysterectomy    . Cholecystectomy      OZH:[id number] of systems complete and found to be negative unless listed above  PHYSICAL EXAM BP 128/73  Pulse 85  Ht 5' 1.5" (1.562 m)  Wt 157 lb 8 oz (71.442 kg)  BMI 29.28 kg/m2  General: Well developed, well nourished, in no acute distress Head: Eyes PERRLA, No xanthomas.   Normal cephalic and atramatic Lungs: Clear bilaterally to auscultation and percussion. Heart: HRRR S1 S2, soft 1/6 systolic murmur  Pulses are 2+ & equal.            No carotid bruit. No JVD.  No abdominal bruits. No femoral bruits. Abdomen: Bowel sounds are positive, abdomen soft and non-tender without masses or                  Hernia's noted. Msk:  Back normal, normal gait. Normal strength and tone for age. Extremities: No clubbing, cyanosis or edema.  DP +1 Neuro: Alert and oriented X 3. Psych:  Good affect, responds appropriately  EKG: Atrial fibrillation rate of 72 bpm.  ASSESSMENT AND PLAN

## 2013-02-11 NOTE — Assessment & Plan Note (Signed)
Heart rate is well-controlled on current medication regimen. She remains on Coumadin. The question concerning holding Coumadin and with Lovenox bridge will be addressed once surgery date has been est. The patient will be given a letter clearing her for surgery, as she has had a recent stress Myoview approximately 3 years ago which was normal and negative for ischemia. She is clear from a cardiac standpoint concerning surgery as a low risk, with the exception of Coumadin dose adjusting and Lovenox bridging. Once date has been set, we will make further recommendations concerning bridging and or holding Coumadin prior to surgery.

## 2013-02-11 NOTE — Patient Instructions (Addendum)
Your physician recommends that you schedule a follow-up appointment in: After surgery , TO BE DETERMINED

## 2013-02-11 NOTE — Progress Notes (Deleted)
Name: Stacie Huang    DOB: 02-Jan-1941  Age: 72 y.o.  MR#: [id number]       PCP:  [name], MD      Insurance: Payor: MEDICARE / Plan: MEDICARE PART A AND B / Product Type: *No Product type* /   CC:    Chief Complaint  Patient presents with  . Atrial Fibrillation  . Hypertension    VS Filed Vitals:   02/11/13 1543  BP: 128/73  Pulse: 85  Height: 5' 1.5" (1.562 m)  Weight: 157 lb 8 oz (71.442 kg)    Weights Current Weight  02/11/13 157 lb 8 oz (71.442 kg)  12/20/12 158 lb (71.668 kg)  10/31/12 154 lb (69.854 kg)    Blood Pressure  BP Readings from Last 3 Encounters:  02/11/13 128/73  12/20/12 126/72  10/31/12 158/72     Admit date:  (Not on file) Last encounter with RMR:  08/18/2012   Allergy Iohexol and Sulfa antibiotics  Current Outpatient Prescriptions  Medication Sig Dispense Refill  . atenolol (TENORMIN) 100 MG tablet Take 50 mg by mouth daily before breakfast.       . Calcium Carbonate (CALCIUM 600) 1500 MG TABS Take 1 tablet by mouth every morning.       . cetirizine (ZYRTEC) 10 MG tablet Take 10 mg by mouth every evening.       . Cholecalciferol (VITAMIN D) 400 UNITS capsule Take 400 Units by mouth every morning.       . clobetasol cream (TEMOVATE) 0.05 % Apply 1 application topically at bedtime.       Marland Kitchen diltiazem (TIAZAC) 120 MG 24 hr capsule Take 120 mg by mouth every morning.      . fluticasone (FLONASE) 50 MCG/ACT nasal spray Place 2 sprays into the nose at bedtime.       Marland Kitchen LORazepam (ATIVAN) 1 MG tablet Take 1 mg by mouth daily as needed for anxiety (AND/OR FOR SLEEP).       Marland Kitchen losartan (COZAAR) 50 MG tablet Take 50 mg by mouth every morning.       Marland Kitchen omeprazole (PRILOSEC) 40 MG capsule Take 40 mg by mouth every morning.       . simvastatin (ZOCOR) 10 MG tablet Take 10 mg by mouth at bedtime.        Marland Kitchen warfarin (COUMADIN) 2.5 MG tablet Take 0.5-1 tablets (1.25-2.5 mg total) by mouth every evening. Take 1/2 tablet daily except 1 tablet on Saturdays  30  tablet  6   No current facility-administered medications for this visit.    Discontinued Meds:   There are no discontinued medications.  Patient Active Problem List   Diagnosis Date Noted  . Atrial fibrillation   . Hypertension   . Chronic anticoagulation 11/11/2010  . Hyperlipidemia 02/26/2010    LABS    Component Value Date/Time   NA 139 04/30/2010 2028   NA 143 01/29/2010 1155   NA 143 01/28/2010   K 4.5 04/30/2010 2028   K 4.6 01/29/2010 1155   K 4.6 01/28/2010   CL 103 04/30/2010 2028   CL 107 01/29/2010 1155   CL 107 01/28/2010   CO2 26 04/30/2010 2028   CO2 26 01/29/2010 1155   CO2 26 01/28/2010   GLUCOSE 107* 04/30/2010 2028   GLUCOSE 87 01/29/2010 1155   GLUCOSE 87 01/28/2010   BUN 16 04/30/2010 2028   BUN 13 01/29/2010 1155   BUN 13 01/28/2010   CREATININE 0.85 04/30/2010 2028  CREATININE 0.91 01/29/2010 1155   CREATININE 0.91 01/28/2010   CALCIUM 9.3 04/30/2010 2028   CALCIUM 9.3 01/29/2010 1155   CALCIUM 9.3 01/28/2010   GFRNONAA >60 10/24/2009 1027   GFRNONAA >60 10/24/2009   GFRNONAA >60 03/15/2009 1520   GFRAA  Value: >60        The eGFR has been calculated using the MDRD equation. This calculation has not been validated in all clinical situations. eGFR's persistently <60 mL/min signify possible Chronic Kidney Disease. 10/24/2009 1027   GFRAA  Value: >60        The eGFR has been calculated using the MDRD equation. This calculation has not been validated in all clinical situations. eGFR's persistently <60 mL/min signify possible Chronic Kidney Disease. 03/15/2009 1520   GFRAA  Value: >60        The eGFR has been calculated using the MDRD equation. This calculation has not been validated in all clinical 03/17/2008 1049   CMP     Component Value Date/Time   NA 139 04/30/2010 2028   K 4.5 04/30/2010 2028   CL 103 04/30/2010 2028   CO2 26 04/30/2010 2028   GLUCOSE 107* 04/30/2010 2028   BUN 16 04/30/2010 2028   CREATININE 0.85 04/30/2010 2028   CALCIUM 9.3 04/30/2010 2028   PROT 7.0 04/30/2010  2028   ALBUMIN 4.3 04/30/2010 2028   AST 24 04/30/2010 2028   ALT 20 04/30/2010 2028   ALKPHOS 66 04/30/2010 2028   BILITOT 0.5 04/30/2010 2028   GFRNONAA >60 10/24/2009 1027   GFRAA  Value: >60        The eGFR has been calculated using the MDRD equation. This calculation has not been validated in all clinical situations. eGFR's persistently <60 mL/min signify possible Chronic Kidney Disease. 10/24/2009 1027       Component Value Date/Time   WBC 7.8 04/30/2010 2028   WBC 10.6* 10/24/2009 1027   WBC 10.6 10/24/2009   HGB 13.5 04/30/2010 2028   HGB 14.2 10/24/2009 1027   HGB 14.2 10/24/2009   HCT 43.1 04/30/2010 2028   HCT 42.9 10/24/2009 1027   HCT 42.9 10/24/2009   MCV 92.1 04/30/2010 2028   MCV 92.2 10/24/2009 1027   MCV 92.2 10/24/2009    Lipid Panel     Component Value Date/Time   CHOL 150 01/29/2010 1155   TRIG 135 01/29/2010 1155   HDL 63 01/29/2010 1155   LDLCALC 60 01/29/2010 1155    ABG No results found for this basename: phart, pco2, pco2art, po2, po2art, hco3, tco2, acidbasedef, o2sat     No results found for this basename: TSH   BNP (last 3 results) No results found for this basename: PROBNP,  in the last 8760 hours Cardiac Panel (last 3 results) No results found for this basename: CKTOTAL, CKMB, TROPONINI, RELINDX,  in the last 72 hours  Iron/TIBC/Ferritin No results found for this basename: iron, tibc, ferritin     EKG Orders placed in visit on 02/11/13  . EKG 12-LEAD     Prior Assessment and Plan Problem List as of 02/11/2013   Hyperlipidemia   Last Assessment & Plan   05/26/2011 Office Visit Written 05/26/2011  3:58 PM by Kathlen Brunswick, MD     Excellent results with very low-dose statin therapy, which will be continued.    Chronic anticoagulation   Last Assessment & Plan   05/26/2011 Office Visit Written 05/26/2011  3:57 PM by Kathlen Brunswick, MD     Stable and therapeutic  INRs.  CBC and stool Hemoccults will be monitored to exclude occult GI blood loss.    Atrial  fibrillation   Last Assessment & Plan   05/31/2012 Office Visit Written 05/31/2012  5:17 PM by Jodelle Gross, NP     Good rate control on diltiazem. She is stable with INR labs. Will continue current regimen. She see Dr Sudie Bailey soon. She is due for colonoscopy this year.     Hypertension   Last Assessment & Plan   05/31/2012 Office Visit Written 05/31/2012  5:18 PM by Jodelle Gross, NP     Excellent BP control presently. She is to continue cozaar 50 mg daily. Labs need to be completed for kidney fx. She states that she sees Dr. Sudie Bailey next week and labs will be completed at that time.        Imaging: Mm Digital Diagnostic Unilat L  01/26/2013   *RADIOLOGY REPORT*  Clinical Data:  The patient returns for evaluation of calcifications in the medial aspect of the left breast noted on recent screening mammogram dated 01/10/2013.  DIGITAL DIAGNOSTIC LEFT MAMMOGRAM  Comparison: 01/09/2012, 01/01/2011, 12/21/2009, 12/20/2008  Findings:  ACR Breast Density Category b:  There are scattered areas of fibroglandular density.  There is a 3 x 4 x 5 mm cluster of microcalcifications at 10 o'clock, 7 cm deep to the nipple.  These vary in size and shape. While this may represent a hyalinizing fibroadenoma, biopsy is suggested.  Stereotactic biopsy was discussed with patient and she agreed with this plan.  IMPRESSION: Slightly suspicious calcifications in the left upper inner quadrant.  RECOMMENDATION: Stereotactic core needle biopsy is suggested.  This will be scheduled at a convenient time for the patient at the [facility].  I have discussed the findings and recommendations with the patient. Results were also provided in writing at the conclusion of the visit.  If applicable, a reminder letter will be sent to the patient regarding her next appointment.  BI-RADS CATEGORY 4:  Suspicious abnormality - biopsy should be considered.   Original Report Authenticated By: Cain Saupe, M.D.   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 The pathology for the calcifications is concordant. The finding of invasive ductal carcinoma is unexpected, as there is no visible mass on the prior diagnostic imaging.  Dr. Sudie Bailey received the biopsy results. He was going to discuss these results with the patient and make the appropriate referrals for definitive treatment.  RECOMMENDATION: Surgical consultation for excision.   Electronically Signed   By: Hulan Saas M.D.   On: 02/08/2013 15:27   02/08/2013   CLINICAL DATA:  Indeterminate calcifications in the upper-inner quadrant of the left breast, junction of middle and posterior 1/3.  EXAM: STEREOTACTIC CORE NEEDLE BIOPSY OF THE LEFT BREAST  COMPARISON:  Previous exams.  FINDINGS: I met with the patient and we discussed the procedure of stereotactic-guided biopsy, including benefits and alternatives. We discussed the high likelihood of a successful procedure. We discussed the risks of the procedure, including infection, bleeding, tissue injury, clip migration, and inadequate sampling. Informed, written consent was given.  Using sterile technique and 2% Lidocaine as local anesthetic, under stereotactic guidance, a 9 gauge vacuum device was used to perform core needle biopsy of calcifications in the upper-inner quadrant of the left  breast using a medial approach. Specimen radiograph was performed, showing calcification in several of the core samples. Specimens with calcifications are identified for pathology.  At the conclusion of the procedure, an hourglass shaped tissue marker clip was deployed into the biopsy cavity. Follow-up 2-view mammogram confirmed clip placement at the site of the calcifications. All of the calcifications were removed with the biopsy.  Expected post biopsy hemorrhage is present.  IMPRESSION: Stereotactic-guided biopsy of calcifications in the upper-inner quadrant of the left breast. No apparent complications.  Electronically Signed: By: Hulan Saas M.D. On: 02/04/2013 11:38

## 2013-02-11 NOTE — Assessment & Plan Note (Signed)
She will continue ongoing low cholesterol diet. She will need followup lipids and LFTs per her primary care physician within the next 6 months.

## 2013-02-11 NOTE — Assessment & Plan Note (Signed)
Good control of blood pressure. We will not make any changes in her medication regimen at this time.

## 2013-02-17 ENCOUNTER — Telehealth: Payer: Self-pay | Admitting: *Deleted

## 2013-02-17 NOTE — Telephone Encounter (Signed)
Needs to know when patient needs to stop Coumadin and start Lovenox for procedure that is scheduled for 02/23/13. / tgs

## 2013-02-17 NOTE — Telephone Encounter (Signed)
Can pt stop coumadin for surgery without Lovenox bridge?   Please advise.

## 2013-02-18 ENCOUNTER — Other Ambulatory Visit (HOSPITAL_COMMUNITY): Payer: Self-pay | Admitting: General Surgery

## 2013-02-18 DIAGNOSIS — C50912 Malignant neoplasm of unspecified site of left female breast: Secondary | ICD-10-CM

## 2013-02-18 DIAGNOSIS — C801 Malignant (primary) neoplasm, unspecified: Secondary | ICD-10-CM

## 2013-02-18 DIAGNOSIS — N63 Unspecified lump in unspecified breast: Secondary | ICD-10-CM

## 2013-02-18 NOTE — Telephone Encounter (Signed)
She is to stop coumadin on 02/19/2013, 5 days prior to surgery. She is to begin coumadin the same day after surgery is completed, if medically safer per Dr. Malvin Johns, at the same doses she was taking prior to holding it. . She does not need LMWH bridging off coumadin.

## 2013-02-18 NOTE — Telephone Encounter (Signed)
Pt is calling for instructions today

## 2013-02-18 NOTE — Telephone Encounter (Signed)
Linda @ Dr [name] office notified of coumadin instructions and she will notify pt.

## 2013-02-21 ENCOUNTER — Encounter (HOSPITAL_COMMUNITY)
Admission: RE | Admit: 2013-02-21 | Discharge: 2013-02-21 | Disposition: A | Discharge status: Home / Self Care | Payer: Medicare Other | Source: Ambulatory Visit | Attending: General Surgery | Admitting: General Surgery

## 2013-02-21 ENCOUNTER — Encounter (HOSPITAL_COMMUNITY): Payer: Self-pay

## 2013-02-21 DIAGNOSIS — Z01812 Encounter for preprocedural laboratory examination: Secondary | ICD-10-CM | POA: Diagnosis not present

## 2013-02-21 DIAGNOSIS — C50919 Malignant neoplasm of unspecified site of unspecified female breast: Secondary | ICD-10-CM | POA: Diagnosis present

## 2013-02-21 LAB — CBC WITH DIFFERENTIAL/PLATELET
Basophils Absolute: 0.1 10*3/uL (ref 0.0–0.1)
Basophils Relative: 1 % (ref 0–1)
Eosinophils Absolute: 0.2 10*3/uL (ref 0.0–0.7)
Hemoglobin: 13.9 g/dL (ref 12.0–15.0)
Lymphs Abs: 3.8 10*3/uL (ref 0.7–4.0)
MCH: 31.4 pg (ref 26.0–34.0)
MCHC: 33.2 g/dL (ref 30.0–36.0)
Monocytes Relative: 10 % (ref 3–12)
Neutro Abs: 2.9 10*3/uL (ref 1.7–7.7)
Neutrophils Relative %: 38 % — ABNORMAL LOW (ref 43–77)
Platelets: 257 10*3/uL (ref 150–400)
RDW: 13.7 % (ref 11.5–15.5)

## 2013-02-21 LAB — BASIC METABOLIC PANEL
BUN: 14 mg/dL (ref 6–23)
Chloride: 101 mEq/L (ref 96–112)
GFR calc Af Amer: 66 mL/min — ABNORMAL LOW (ref >90)
GFR calc non Af Amer: 57 mL/min — ABNORMAL LOW (ref >90)
Glucose, Bld: 108 mg/dL — ABNORMAL HIGH (ref 70–99)
Potassium: 4.6 mEq/L (ref 3.5–5.1)

## 2013-02-21 NOTE — Consult Note (Signed)
NAME:  Stacie Huang, LABLANC NO.:  [id number]  MEDICAL RECORD NO.:  [id number]  LOCATION:                                 FACILITY:  PHYSICIAN:  Barbaraann Barthel, M.D. DATE OF BIRTH:  May 13, 1940  DATE OF CONSULTATION:  02/18/2013 DATE OF DISCHARGE:                                CONSULTATION   DIAGNOSIS:  Invasive ductal carcinoma, left breast.  Note this is a 72 year old, white female who had an biopsy-proven invasive ductal carcinoma of the left breast.  She was referred for surgical treatment.  PAST MEDICAL HISTORY:  Positive for hypertension, atrial fibrillation, hypercholesterolemia, and GERD.  PAST SURGERIES:  TAH-BSO in the 1980s and gallbladder surgery in the 1980s.  For medications, please check medication list.  The patient is allergic to sulfa and IV dye.  She is a nonsmoker and nondrinker.  PHYSICAL EXAMINATION:  VITAL SIGNS:  She is in no acute distress.  She is 5 feet 1-1/2 inches tall, weighs 158 pounds.  Temperature is 97, pulse is 72 and regular, respirations 12, blood pressure 180/90. HEAD AND NECK:  Normocephalic.  Eyes, extraocular movements are intact. Pupils were round and react to light and accommodation.  There is no conjunctival pallor or scleral injection.  The sclera is a normal tincture.  The patient does have some cataracts.  The patient has dental prosthesis but no adenopathy, no bruits are appreciated.  No thyromegaly and no jugular vein distention. CHEST:  Clear. HEART:  Irregular rhythm.  She is in atrial fib. BREAST EXAM:  No masses in the axilla appreciated. ABDOMEN:  Soft.  No hernias. RECTAL:  Deferred. EXTREMITIES:  Within normal limits.  REVIEW OF SYSTEMS:  The patient has neurosystem.  No lateralizing neurological findings.  No history of migraines or seizures. ENDOCRINE HISTORY:  No history of diabetes, thyroid disease, or adrenal problems. CARDIOPULMONARY SYSTEM:  History of hypercholesterolemia,  hypertension, and atrial fibrillation, for which she is being followed perioperatively by the [facility] who were consulted preoperatively. MUSCULOSKELETAL SYSTEM:  She has some joint aches and pains but nothing unusual for age.  OB/GYN HISTORY:  She is a gravida 2, para 2, cesarean 0, abortus 0, female whose last menstrual period was in 109s after her TAH-BSO and she has no family history of carcinoma of the breast.  GI SYSTEM:  History of GERD.  No history of hepatitis, constipation, bright red rectal bleeding, diarrhea, melena, or inflammatory bowel disease or irritable bowel syndrome.  No unexplained weight loss.  She had a colonoscopy about 15 years ago.  GU SYSTEM:  No history of frequency, dysuria, or kidney stones.  REVIEW OF HISTORY AND PHYSICAL:  Therefore, Stacie Huang is a 72 year old, white female who has invasive ductal carcinoma of the left breast.  She was seen in the early part of October which she had a biopsy.  A core needle biopsy of an area at the 10 o'clock position of the left breast. There were some microcalcifications and these were suspicious for carcinoma of the breast and were confirmed to be so after stereotactic core needle biopsy was done.  PLAN:  The patient will have a needle localization and  we will do a wide excision and sentinel node biopsy and possible axillary node dissection. We have coordinated this with the Cardiology Department who will be incharge of ordering her Lovenox bridging and resumption of her Coumadin.  We have discussed complications not limited to, but including bleeding, infection, and the possibility of further surgery might be required.  Informed consent was obtained.     Barbaraann Barthel, M.D.     WB/MEDQ  D:  02/18/2013  T:  02/18/2013  Job:  [id number]  cc:   Mila Homer. Sudie Bailey, M.D. Fax: [phone]  [facility]

## 2013-02-21 NOTE — Patient Instructions (Signed)
Stacie Huang  02/21/2013   Your procedure is scheduled on: 02/23/2013  Report to [facility] at  700  AM.  Call this number if you have problems the morning of surgery: [phone]   Remember:   Do not eat food or drink liquids after midnight.   Take these medicines the morning of surgery with A SIP OF WATER: atenolol, zyrtec, tiazac, ativam ,cozaar, prilosec   Do not wear jewelry, make-up or nail polish.  Do not wear lotions, powders, or perfumes.   Do not shave 48 hours prior to surgery. Men may shave face and neck.  Do not bring valuables to the hospital.  [facility] is not responsible for any belongings or valuables.               Contacts, dentures or bridgework may not be worn into surgery.  Leave suitcase in the car. After surgery it may be brought to your room.  For patients admitted to the hospital, discharge time is determined by your treatment team.               Patients discharged the day of surgery will not be allowed to drive home.  Name and phone number of your driver: family  Special Instructions: Shower using CHG 2 nights before surgery and the night before surgery.  If you shower the day of surgery use CHG.  Use special wash - you have one bottle of CHG for all showers.  You should use approximately 1/3 of the bottle for each shower.   Please read over the following fact sheets that you were given: Pain Booklet, Coughing and Deep Breathing, Surgical Site Infection Prevention, Anesthesia Post-op Instructions and Care and Recovery After Surgery Breast Biopsy A breast biopsy is a procedure where a sample of breast tissue is removed from your breast. The tissue is examined under a microscope to see if cancerous cells are present. A breast biopsy is done when there is:  Any undiagnosed breast mass (tumor).  Nipple abnormalities, dimpling, crusting, or ulcerations.  Abnormal discharge from the nipple, especially blood.  Redness, swelling, and pain of the  breast.  Calcium deposits (calcifications) or abnormalities seen on a mammogram, ultrasound result, or results of magnetic resonance imaging (MRI).  Suspicious changes in the breast seen on your mammogram. If the tumor is found to be cancerous (malignant), a breast biopsy can help to determine what the best treatment is for you. There are many different types of breast biopsies. Talk to your caregiver about your options and which type is best for you. LET YOUR CAREGIVER KNOW ABOUT:  Allergies to food or medicine.  Medicines taken, including vitamins, herbs, eyedrops, over-the-counter medicines, and creams.  Use of steroids (by mouth or creams).  Previous problems with anesthetics or numbing medicines.  History of bleeding problems or blood clots.  Previous surgery.  Other health problems, including diabetes and kidney problems.  Any recent colds or infections.  Possibility of pregnancy, if this applies. RISKS AND COMPLICATIONS   Bleeding.  Infection.  Allergy to medicines.  Bruising and swelling of the breast.  Alteration in the shape of the breast.  Not finding the lump or abnormality.  Needing more surgery. BEFORE THE PROCEDURE  Arrange for someone to drive you home after the procedure.  Do not smoke for 2 weeks before the procedure. Stop smoking, if you smoke.  Do not drink alcohol for 24 hours before procedure.  Wear a good support bra to  the procedure. PROCEDURE  You may be given a medicine to numb the breast area (local anesthesia) or a medicine to make you sleep (general anesthesia) during the procedure. The following are the different types of biopsies that can be performed.   Fine-needle aspiration A thin needle is attached to a syringe and inserted into the breast lump. Fluid and cells are removed and then looked at under a microscope. If the breast lump cannot be felt, an ultrasound may be used to help locate the lump and place the needle in the correct  area.   Core needle biopsy A wide, hollow needle (core needle) is inserted into the breast lump 3 6 times to get tissue samples or cores. The samples are removed. The needle is usually placed in the correct area by using an ultrasound or X-ray.   Stereotactic biopsy X-ray equipment and a computer are used to analyze X-ray pictures of the breast lump. The computer then finds exactly where the core needle needs to be inserted. Tissue samples are removed.   Vacuum-assisted biopsy A small incision (less than  inch) is made in your breast. A biopsy device that includes a hollow needle and vacuum is passed through the incision and into the breast tissue. The vacuum gently draws abnormal breast tissue into the needle to remove it. This type of biopsy removes a larger tissue sample than a regular core needle biopsy. No stitches are needed, and there is usually little scarring.  Ultrasound-guided core needle biopsy A high frequency ultrasound helps guide the core needle to the area of the mass or abnormality. An incision is made to insert the needle. Tissue samples are removed.  Open biopsy A larger incision is made in the breast. Your caregiver will attempt to remove the whole breast lump or as much as possible. AFTER THE PROCEDURE  You will be taken to the recovery area. If you are doing well and have no problems, you will be allowed to go home.  You may notice bruising on your breast. This is normal.  Your caregiver may apply a pressure dressing on your breast for 24 48 hours. A pressure dressing is a bandage that is wrapped tightly around the chest to stop fluid from collecting underneath tissues. Document Released: 04/14/2005 Document Revised: 10/14/2011 Document Reviewed: 05/15/2011 [facility] Patient Information 2014 [place], [place]. PATIENT INSTRUCTIONS POST-ANESTHESIA  IMMEDIATELY FOLLOWING SURGERY:  Do not drive or operate machinery for the first twenty four hours after surgery.  Do not  make any important decisions for twenty four hours after surgery or while taking narcotic pain medications or sedatives.  If you develop intractable nausea and vomiting or a severe headache please notify your doctor immediately.  FOLLOW-UP:  Please make an appointment with your surgeon as instructed. You do not need to follow up with anesthesia unless specifically instructed to do so.  WOUND CARE INSTRUCTIONS (if applicable):  Keep a dry clean dressing on the anesthesia/puncture wound site if there is drainage.  Once the wound has quit draining you may leave it open to air.  Generally you should leave the bandage intact for twenty four hours unless there is drainage.  If the epidural site drains for more than 36-48 hours please call the anesthesia department.  QUESTIONS?:  Please feel free to call your physician or the hospital operator if you have any questions, and they will be happy to assist you.

## 2013-02-23 ENCOUNTER — Encounter (HOSPITAL_COMMUNITY): Payer: Self-pay

## 2013-02-23 ENCOUNTER — Encounter (HOSPITAL_COMMUNITY): Payer: Self-pay | Admitting: Anesthesiology

## 2013-02-23 ENCOUNTER — Encounter (HOSPITAL_COMMUNITY)
Admission: RE | Disposition: A | Discharge status: Home / Self Care | Payer: Self-pay | Source: Ambulatory Visit | Attending: General Surgery

## 2013-02-23 ENCOUNTER — Ambulatory Visit (HOSPITAL_COMMUNITY)
Admission: RE | Admit: 2013-02-23 | Discharge: 2013-02-23 | Disposition: A | Discharge status: Home / Self Care | Payer: Medicare Other | Source: Ambulatory Visit | Attending: General Surgery | Admitting: General Surgery

## 2013-02-23 ENCOUNTER — Encounter (HOSPITAL_COMMUNITY)
Admission: RE | Admit: 2013-02-23 | Discharge: 2013-02-23 | Disposition: A | Discharge status: Home / Self Care | Payer: Medicare Other | Source: Ambulatory Visit | Attending: General Surgery | Admitting: General Surgery

## 2013-02-23 ENCOUNTER — Other Ambulatory Visit (HOSPITAL_COMMUNITY): Payer: Self-pay | Admitting: General Surgery

## 2013-02-23 ENCOUNTER — Ambulatory Visit (HOSPITAL_COMMUNITY): Payer: Medicare Other | Admitting: Anesthesiology

## 2013-02-23 ENCOUNTER — Ambulatory Visit (HOSPITAL_COMMUNITY)
Admission: RE | Admit: 2013-02-23 | Discharge: 2013-02-24 | Disposition: A | Discharge status: Home / Self Care | Payer: Medicare Other | Source: Ambulatory Visit | Attending: General Surgery | Admitting: General Surgery

## 2013-02-23 ENCOUNTER — Encounter (HOSPITAL_COMMUNITY): Payer: Medicare Other | Admitting: Anesthesiology

## 2013-02-23 ENCOUNTER — Encounter (HOSPITAL_COMMUNITY): Payer: Self-pay | Admitting: *Deleted

## 2013-02-23 DIAGNOSIS — R921 Mammographic calcification found on diagnostic imaging of breast: Secondary | ICD-10-CM

## 2013-02-23 DIAGNOSIS — I4891 Unspecified atrial fibrillation: Secondary | ICD-10-CM

## 2013-02-23 DIAGNOSIS — C50919 Malignant neoplasm of unspecified site of unspecified female breast: Secondary | ICD-10-CM | POA: Diagnosis not present

## 2013-02-23 DIAGNOSIS — Z01812 Encounter for preprocedural laboratory examination: Secondary | ICD-10-CM | POA: Insufficient documentation

## 2013-02-23 DIAGNOSIS — C801 Malignant (primary) neoplasm, unspecified: Secondary | ICD-10-CM

## 2013-02-23 DIAGNOSIS — N63 Unspecified lump in unspecified breast: Secondary | ICD-10-CM

## 2013-02-23 DIAGNOSIS — Z7901 Long term (current) use of anticoagulants: Secondary | ICD-10-CM

## 2013-02-23 DIAGNOSIS — C50912 Malignant neoplasm of unspecified site of left female breast: Secondary | ICD-10-CM

## 2013-02-23 HISTORY — PX: PARTIAL MASTECTOMY WITH AXILLARY SENTINEL LYMPH NODE BIOPSY: SHX6004

## 2013-02-23 HISTORY — PX: AXILLARY LYMPH NODE DISSECTION: SHX5229

## 2013-02-23 HISTORY — DX: Malignant (primary) neoplasm, unspecified: C80.1

## 2013-02-23 LAB — PROTIME-INR
INR: 1.17 (ref 0.00–1.49)
Prothrombin Time: 14.7 seconds (ref 11.6–15.2)

## 2013-02-23 SURGERY — PARTIAL MASTECTOMY WITH AXILLARY SENTINEL LYMPH NODE BIOPSY
Anesthesia: General | Site: Breast | Laterality: Left | Wound class: Clean

## 2013-02-23 MED ORDER — LIDOCAINE HCL (PF) 1 % IJ SOLN
INTRAMUSCULAR | Status: AC
  Filled 2013-02-23: qty 5

## 2013-02-23 MED ORDER — SODIUM CHLORIDE BACTERIOSTATIC 0.9 % IJ SOLN
INTRAMUSCULAR | Status: AC
  Filled 2013-02-23: qty 10

## 2013-02-23 MED ORDER — ONDANSETRON HCL 4 MG/2ML IJ SOLN
INTRAMUSCULAR | Status: AC
  Filled 2013-02-23: qty 2

## 2013-02-23 MED ORDER — ONDANSETRON HCL 4 MG/2ML IJ SOLN
4.0000 mg | Freq: Once | INTRAMUSCULAR | Status: AC | PRN
  Administered 2013-02-23: 4 mg via INTRAVENOUS
  Filled 2013-02-23: qty 2

## 2013-02-23 MED ORDER — MORPHINE SULFATE 2 MG/ML IJ SOLN
1.0000 mg | INTRAMUSCULAR | Status: DC | PRN
  Administered 2013-02-23: 1 mg via INTRAVENOUS
  Filled 2013-02-23: qty 1

## 2013-02-23 MED ORDER — STERILE WATER FOR IRRIGATION IR SOLN
Status: DC | PRN
  Administered 2013-02-23 (×2): 1000 mL

## 2013-02-23 MED ORDER — MORPHINE SULFATE 4 MG/ML IJ SOLN: 1.0000 mg | INTRAMUSCULAR | Status: DC | PRN

## 2013-02-23 MED ORDER — METHYLENE BLUE 1 % INJ SOLN
INTRAMUSCULAR | Status: AC
  Filled 2013-02-23: qty 3

## 2013-02-23 MED ORDER — LORATADINE 10 MG PO TABS
10.0000 mg | ORAL_TABLET | Freq: Every day | ORAL | Status: DC
  Administered 2013-02-24: 10 mg via ORAL
  Filled 2013-02-23: qty 1

## 2013-02-23 MED ORDER — BACITRACIN-NEOMYCIN-POLYMYXIN 400-5-5000 EX OINT
TOPICAL_OINTMENT | CUTANEOUS | Status: AC
  Filled 2013-02-23: qty 1

## 2013-02-23 MED ORDER — CLOBETASOL PROPIONATE 0.05 % EX OINT
1.0000 "application " | TOPICAL_OINTMENT | Freq: Every day | CUTANEOUS | Status: DC
  Administered 2013-02-23: 1 via TOPICAL
  Filled 2013-02-23: qty 15

## 2013-02-23 MED ORDER — BACITRACIN-NEOMYCIN-POLYMYXIN 400-5-5000 EX OINT
TOPICAL_OINTMENT | CUTANEOUS | Status: DC | PRN
  Administered 2013-02-23: 1 via TOPICAL

## 2013-02-23 MED ORDER — TECHNETIUM TC 99M SULFUR COLLOID FILTERED
0.5000 | Freq: Once | INTRAVENOUS | Status: AC | PRN
  Administered 2013-02-23: 0.5 via INTRADERMAL

## 2013-02-23 MED ORDER — SODIUM CHLORIDE 0.9 % IJ SOLN
INTRAMUSCULAR | Status: DC | PRN
  Administered 2013-02-23: 10:00:00

## 2013-02-23 MED ORDER — ONDANSETRON HCL 4 MG PO TABS: 4.0000 mg | ORAL_TABLET | Freq: Four times a day (QID) | ORAL | Status: DC | PRN

## 2013-02-23 MED ORDER — WARFARIN SODIUM 2.5 MG PO TABS
2.5000 mg | ORAL_TABLET | Freq: Once | ORAL | Status: AC
  Administered 2013-02-23: 2.5 mg via ORAL
  Filled 2013-02-23: qty 1

## 2013-02-23 MED ORDER — CALCIUM CARBONATE 1250 (500 CA) MG PO TABS
1.0000 | ORAL_TABLET | Freq: Every morning | ORAL | Status: DC
  Administered 2013-02-24: 500 mg via ORAL
  Filled 2013-02-23 (×2): qty 1

## 2013-02-23 MED ORDER — PROPOFOL 10 MG/ML IV BOLUS
INTRAVENOUS | Status: AC
  Filled 2013-02-23: qty 20

## 2013-02-23 MED ORDER — SODIUM CHLORIDE 0.9 % IJ SOLN
INTRAMUSCULAR | Status: AC
  Filled 2013-02-23: qty 10

## 2013-02-23 MED ORDER — CHOLECALCIFEROL 10 MCG (400 UNIT) PO TABS
400.0000 [IU] | ORAL_TABLET | Freq: Every morning | ORAL | Status: DC
  Administered 2013-02-24: 400 [IU] via ORAL
  Filled 2013-02-23 (×2): qty 1

## 2013-02-23 MED ORDER — WARFARIN 1.25 MG HALF TABLET: 1.2500 mg | ORAL_TABLET | Freq: Every evening | ORAL | Status: DC

## 2013-02-23 MED ORDER — ROCURONIUM BROMIDE 100 MG/10ML IV SOLN
INTRAVENOUS | Status: DC | PRN
  Administered 2013-02-23: 25 mg via INTRAVENOUS
  Administered 2013-02-23: 5 mg via INTRAVENOUS

## 2013-02-23 MED ORDER — FENTANYL CITRATE 0.05 MG/ML IJ SOLN
INTRAMUSCULAR | Status: DC | PRN
  Administered 2013-02-23 (×5): 50 ug via INTRAVENOUS

## 2013-02-23 MED ORDER — ONDANSETRON HCL 4 MG/2ML IJ SOLN
4.0000 mg | Freq: Four times a day (QID) | INTRAMUSCULAR | Status: DC | PRN
  Administered 2013-02-23: 4 mg via INTRAVENOUS
  Filled 2013-02-23: qty 2

## 2013-02-23 MED ORDER — PENTAFLUOROPROP-TETRAFLUOROETH EX AERO
INHALATION_SPRAY | CUTANEOUS | Status: AC
  Filled 2013-02-23: qty 103.5

## 2013-02-23 MED ORDER — LIDOCAINE HCL 1 % IJ SOLN
INTRAMUSCULAR | Status: DC | PRN
  Administered 2013-02-23: 30 mg via INTRADERMAL

## 2013-02-23 MED ORDER — LACTATED RINGERS IV SOLN
INTRAVENOUS | Status: DC
  Administered 2013-02-23 (×2): via INTRAVENOUS

## 2013-02-23 MED ORDER — FENTANYL CITRATE 0.05 MG/ML IJ SOLN
25.0000 ug | INTRAMUSCULAR | Status: DC | PRN
  Administered 2013-02-23: 50 ug via INTRAVENOUS
  Administered 2013-02-23 (×2): 25 ug via INTRAVENOUS

## 2013-02-23 MED ORDER — PHENYLEPHRINE HCL 10 MG/ML IJ SOLN
INTRAMUSCULAR | Status: AC
  Filled 2013-02-23: qty 1

## 2013-02-23 MED ORDER — FLUTICASONE PROPIONATE 50 MCG/ACT NA SUSP
2.0000 | Freq: Every day | NASAL | Status: DC
  Administered 2013-02-23: 2 via NASAL
  Filled 2013-02-23: qty 16

## 2013-02-23 MED ORDER — CEFAZOLIN SODIUM-DEXTROSE 2-3 GM-% IV SOLR
2.0000 g | Freq: Once | INTRAVENOUS | Status: AC
  Administered 2013-02-23: 2 g via INTRAVENOUS

## 2013-02-23 MED ORDER — POTASSIUM CHLORIDE IN NACL 20-0.9 MEQ/L-% IV SOLN
INTRAVENOUS | Status: DC
  Administered 2013-02-23: 15:00:00 via INTRAVENOUS

## 2013-02-23 MED ORDER — ETOMIDATE 2 MG/ML IV SOLN
INTRAVENOUS | Status: DC | PRN
  Administered 2013-02-23: 14 mg via INTRAVENOUS
  Administered 2013-02-23: 4 mg via INTRAVENOUS

## 2013-02-23 MED ORDER — ROCURONIUM BROMIDE 50 MG/5ML IV SOLN
INTRAVENOUS | Status: AC
  Filled 2013-02-23: qty 1

## 2013-02-23 MED ORDER — BUPIVACAINE HCL (PF) 0.5 % IJ SOLN
INTRAMUSCULAR | Status: AC
  Filled 2013-02-23: qty 30

## 2013-02-23 MED ORDER — CEFAZOLIN SODIUM-DEXTROSE 2-3 GM-% IV SOLR
INTRAVENOUS | Status: AC
  Filled 2013-02-23: qty 50

## 2013-02-23 MED ORDER — ATENOLOL 25 MG PO TABS
50.0000 mg | ORAL_TABLET | Freq: Every day | ORAL | Status: DC
  Administered 2013-02-24: 50 mg via ORAL
  Filled 2013-02-23: qty 2

## 2013-02-23 MED ORDER — PANTOPRAZOLE SODIUM 40 MG PO TBEC
40.0000 mg | DELAYED_RELEASE_TABLET | Freq: Every day | ORAL | Status: DC
  Administered 2013-02-24: 40 mg via ORAL
  Filled 2013-02-23: qty 1

## 2013-02-23 MED ORDER — WARFARIN SODIUM 5 MG PO TABS: 5.0000 mg | ORAL_TABLET | Freq: Once | ORAL | Status: DC

## 2013-02-23 MED ORDER — SIMVASTATIN 10 MG PO TABS
10.0000 mg | ORAL_TABLET | Freq: Every day | ORAL | Status: DC
  Administered 2013-02-23: 10 mg via ORAL
  Filled 2013-02-23: qty 1

## 2013-02-23 MED ORDER — MIDAZOLAM HCL 2 MG/2ML IJ SOLN
INTRAMUSCULAR | Status: AC
  Filled 2013-02-23: qty 2

## 2013-02-23 MED ORDER — LORAZEPAM 1 MG PO TABS: 1.0000 mg | ORAL_TABLET | Freq: Every day | ORAL | Status: DC | PRN

## 2013-02-23 MED ORDER — PHENYLEPHRINE HCL 10 MG/ML IJ SOLN
INTRAMUSCULAR | Status: DC | PRN
  Administered 2013-02-23: 50 ug via INTRAVENOUS

## 2013-02-23 MED ORDER — ONDANSETRON HCL 4 MG/2ML IJ SOLN
4.0000 mg | Freq: Once | INTRAMUSCULAR | Status: AC
  Administered 2013-02-23: 4 mg via INTRAVENOUS

## 2013-02-23 MED ORDER — WARFARIN - PHARMACIST DOSING INPATIENT
Status: DC
  Administered 2013-02-23: 18:00:00

## 2013-02-23 MED ORDER — LOSARTAN POTASSIUM 50 MG PO TABS
50.0000 mg | ORAL_TABLET | Freq: Every morning | ORAL | Status: DC
  Administered 2013-02-24: 50 mg via ORAL
  Filled 2013-02-23: qty 1

## 2013-02-23 MED ORDER — DILTIAZEM HCL ER COATED BEADS 120 MG PO CP24
120.0000 mg | ORAL_CAPSULE | Freq: Every morning | ORAL | Status: DC
  Administered 2013-02-24: 120 mg via ORAL
  Filled 2013-02-23: qty 1

## 2013-02-23 MED ORDER — FENTANYL CITRATE 0.05 MG/ML IJ SOLN
INTRAMUSCULAR | Status: AC
  Filled 2013-02-23: qty 5

## 2013-02-23 MED ORDER — MIDAZOLAM HCL 2 MG/2ML IJ SOLN
1.0000 mg | INTRAMUSCULAR | Status: DC | PRN
  Administered 2013-02-23: 2 mg via INTRAVENOUS

## 2013-02-23 SURGICAL SUPPLY — 53 items
APL SKNCLS STERI-STRIP NONHPOA (GAUZE/BANDAGES/DRESSINGS) ×3
APPLIER CLIP 11 MED OPEN (CLIP) ×4
APR CLP MED 11 20 MLT OPN (CLIP) ×3
ATTRACTOMAT 16X20 MAGNETIC DRP (DRAPES) ×2 IMPLANT
BAG HAMPER (MISCELLANEOUS) ×4 IMPLANT
BENZOIN TINCTURE PRP APPL 2/3 (GAUZE/BANDAGES/DRESSINGS) ×2 IMPLANT
BLADE SURG 15 STRL LF DISP TIS (BLADE) ×9 IMPLANT
BLADE SURG 15 STRL SS (BLADE) ×12
CLEANER TIP ELECTROSURG 2X2 (MISCELLANEOUS) ×2 IMPLANT
CLIP APPLIE 11 MED OPEN (CLIP) ×1 IMPLANT
CLOTH BEACON ORANGE TIMEOUT ST (SAFETY) ×4 IMPLANT
CONT SPECI 4OZ STER CLIK (MISCELLANEOUS) ×4 IMPLANT
COVER LIGHT HANDLE STERIS (MISCELLANEOUS) ×8 IMPLANT
ELECT REM PT RETURN 9FT ADLT (ELECTROSURGICAL) ×4
ELECTRODE REM PT RTRN 9FT ADLT (ELECTROSURGICAL) ×3 IMPLANT
GLOVE BIOGEL PI IND STRL 7.0 (GLOVE) ×2 IMPLANT
GLOVE BIOGEL PI INDICATOR 7.0 (GLOVE) ×2
GLOVE ECLIPSE 6.5 STRL STRAW (GLOVE) ×2 IMPLANT
GLOVE EXAM NITRILE MD LF STRL (GLOVE) ×2 IMPLANT
GLOVE SKINSENSE NS SZ7.0 (GLOVE) ×1
GLOVE SKINSENSE STRL SZ7.0 (GLOVE) ×3 IMPLANT
GLOVE SS BIOGEL STRL SZ 6.5 (GLOVE) ×1 IMPLANT
GLOVE SUPERSENSE BIOGEL SZ 6.5 (GLOVE) ×1
GOWN STRL REIN XL XLG (GOWN DISPOSABLE) ×10 IMPLANT
KIT ROOM TURNOVER APOR (KITS) ×4 IMPLANT
MANIFOLD NEPTUNE II (INSTRUMENTS) ×4 IMPLANT
MARKER SKIN DUAL TIP RULER LAB (MISCELLANEOUS) ×4 IMPLANT
NDL HYPO 18GX1.5 BLUNT FILL (NEEDLE) ×2 IMPLANT
NDL HYPO 25X1 1.5 SAFETY (NEEDLE) ×4 IMPLANT
NEEDLE HYPO 18GX1.5 BLUNT FILL (NEEDLE) ×4 IMPLANT
NEEDLE HYPO 25X1 1.5 SAFETY (NEEDLE) ×4 IMPLANT
PACK MINOR (CUSTOM PROCEDURE TRAY) ×4 IMPLANT
PAD ARMBOARD 7.5X6 YLW CONV (MISCELLANEOUS) ×4 IMPLANT
SET BASIN LINEN APH (SET/KITS/TRAYS/PACK) ×4 IMPLANT
SOL PREP PROV IODINE SCRUB 4OZ (MISCELLANEOUS) ×4 IMPLANT
SPONGE GAUZE 4X4 12PLY (GAUZE/BANDAGES/DRESSINGS) ×4 IMPLANT
SPONGE INTESTINAL PEANUT (DISPOSABLE) ×2 IMPLANT
SPONGE LAP 18X18 X RAY DECT (DISPOSABLE) ×4 IMPLANT
STOCKINETTE IMPERVIOUS LG (DRAPES) ×4 IMPLANT
STRIP CLOSURE SKIN 1/2X4 (GAUZE/BANDAGES/DRESSINGS) ×2 IMPLANT
STRIP CLOSURE SKIN 1/4X3 (GAUZE/BANDAGES/DRESSINGS) ×2 IMPLANT
SUT VIC AB 3-0 SH 27 (SUTURE) ×4
SUT VIC AB 3-0 SH 27X BRD (SUTURE) ×3 IMPLANT
SUT VIC AB 5-0 P-3 18X BRD (SUTURE) ×5 IMPLANT
SUT VIC AB 5-0 P3 18 (SUTURE) ×12
SUT VICRYL AB 3 0 TIES (SUTURE) ×4 IMPLANT
SUT VICRYL AB 3-0 BRD CT 36IN (SUTURE) ×2 IMPLANT
SYR BULB IRRIGATION 50ML (SYRINGE) ×6 IMPLANT
SYR CONTROL 10ML LL (SYRINGE) ×4 IMPLANT
TAPE CLOTH SURG 4X10 WHT LF (GAUZE/BANDAGES/DRESSINGS) ×2 IMPLANT
TAPE PAPER 2X10 WHT MICROPORE (GAUZE/BANDAGES/DRESSINGS) ×2 IMPLANT
TOWEL OR 17X26 4PK STRL BLUE (TOWEL DISPOSABLE) ×4 IMPLANT
WATER STERILE IRR 1000ML POUR (IV SOLUTION) ×12 IMPLANT

## 2013-02-23 NOTE — Anesthesia Procedure Notes (Signed)
Procedure Name: Intubation Date/Time: 02/23/2013 10:14 AM Performed by: Glynn Octave E Pre-anesthesia Checklist: Patient identified, Patient being monitored, Timeout performed, Emergency Drugs available and Suction available Patient Re-evaluated:Patient Re-evaluated prior to inductionOxygen Delivery Method: Circle System Utilized Preoxygenation: Pre-oxygenation with 100% oxygen Intubation Type: IV induction, Rapid sequence and Cricoid Pressure applied Ventilation: Mask ventilation without difficulty Laryngoscope Size: Mac and 3 Grade View: Grade I Tube type: Oral Tube size: 7.0 mm Number of attempts: 1 Airway Equipment and Method: stylet Placement Confirmation: ETT inserted through vocal cords under direct vision,  positive ETCO2 and breath sounds checked- equal and bilateral Secured at: 21 cm Tube secured with: Tape Dental Injury: Teeth and Oropharynx as per pre-operative assessment

## 2013-02-23 NOTE — OR Nursing (Signed)
Nuc ned tech in pre op  room  6 to inject.

## 2013-02-23 NOTE — Progress Notes (Addendum)
ANTICOAGULATION CONSULT NOTE - Initial Consult  Pharmacy Consult for Coumadin (chronic Rx PTA) Indication: atrial fibrillation  Allergies  Allergen Reactions  . Iohexol Hives  . Sulfa Antibiotics Rash   Patient Measurements: Height: 5\' 2"  (157.5 cm) Weight: 163 lb 14.4 oz (74.345 kg) IBW/kg (Calculated) : 50.1  Vital Signs: Temp: 97.9 F (36.6 C) (10/29 1232) Temp src: Oral (10/29 0725) BP: 113/58 mmHg (10/29 1345) Pulse Rate: 62 (10/29 1400)  Labs:  Recent Labs  02/21/13 1250 02/23/13 0700  HGB 13.9  --   HCT 41.9  --   PLT 257  --   LABPROT  --  14.7  INR  --  1.17  CREATININE 0.96  --    Estimated Creatinine Clearance: 49.3 ml/min (by C-G formula based on Cr of 0.96).  Medical History: Past Medical History  Diagnosis Date  . Hypertension   . Paroxysmal atrial fibrillation   . Chronic anticoagulation   . Hyperlipidemia   . GERD (gastroesophageal reflux disease)   . Cancer    Medications:  Prescriptions prior to admission  Medication Sig Dispense Refill  . atenolol (TENORMIN) 100 MG tablet Take 50 mg by mouth daily before breakfast.       . Calcium Carbonate (CALCIUM 600) 1500 MG TABS Take 1 tablet by mouth every morning.       . cetirizine (ZYRTEC) 10 MG tablet Take 10 mg by mouth every evening.       . Cholecalciferol (VITAMIN D) 400 UNITS capsule Take 400 Units by mouth every morning.       . clobetasol cream (TEMOVATE) 0.05 % Apply 1 application topically at bedtime.       Marland Kitchen diltiazem (TIAZAC) 120 MG 24 hr capsule Take 120 mg by mouth every morning.      . fluticasone (FLONASE) 50 MCG/ACT nasal spray Place 2 sprays into the nose at bedtime.       Marland Kitchen LORazepam (ATIVAN) 1 MG tablet Take 1 mg by mouth daily as needed for anxiety (AND/OR FOR SLEEP).       Marland Kitchen losartan (COZAAR) 50 MG tablet Take 50 mg by mouth every morning.       Marland Kitchen omeprazole (PRILOSEC) 40 MG capsule Take 40 mg by mouth every morning.       . simvastatin (ZOCOR) 10 MG tablet Take 10 mg by  mouth at bedtime.        Marland Kitchen warfarin (COUMADIN) 2.5 MG tablet Take 0.5-1 tablets (1.25-2.5 mg total) by mouth every evening. Take 1/2 tablet daily except 1 tablet on Saturdays  30 tablet  6    Assessment: 72yo female who is on chronic Coumadin for h/o afib.  Home dose is listed above.  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  Coumadin was held x 5 days prior to surgery.  Asked to resume Coumadin at previous dose day of surgery.    Goal of Therapy:  INR 2-3 Monitor platelets by anticoagulation protocol: Yes   Plan:  Coumadin 2.5mg  today x 1 INR daily Monitor CBC and any s/sx of bleeding complications.  Valrie Hart A 02/23/2013,4:04 PM

## 2013-02-23 NOTE — OR Nursing (Signed)
Patient ready for sentinel node injection,  nuc med called left message

## 2013-02-23 NOTE — Brief Op Note (Signed)
02/23/2013  12:28 PM  PATIENT:  Stacie Huang  72 y.o. female  PRE-OPERATIVE DIAGNOSIS:  invasive ductal carcinoma left breast  POST-OPERATIVE DIAGNOSIS:  t  PROCEDURE:  Procedure(s): Left Partial Mastectomy, Left Wide Excision, Left Sentinel Node Biopsy, Left Axillary Disection (Left)  SURGEON:  Surgeon(s) and Role:    * Marlane Hatcher, MD - Primary  PHYSICIAN ASSISTANT:   ASSISTANTS: none   ANESTHESIA:   general  EBL:  Total I/O In: 1000 [I.V.:1000] Out: 50 [Blood:50]  BLOOD ADMINISTERED:none  DRAINS: none   LOCAL MEDICATIONS USED:  NONE  SPECIMEN:  Source of Specimen:  Left breast tissue and L axillary nodes.  DISPOSITION OF SPECIMEN:  PATHOLOGY  COUNTS:  YES  TOURNIQUET:  * No tourniquets in log *  DICTATION: .Other Dictation: Dictation Number OR dict. #  [id number].  PLAN OF CARE: Admit for overnight observation  PATIENT DISPOSITION:  PACU - hemodynamically stable.   Delay start of Pharmacological VTE agent (>24hrs) due to surgical blood loss or risk of bleeding: not applicable

## 2013-02-23 NOTE — Consult Note (Signed)
CARDIOLOGY CONSULT NOTE   Stacie Huang ID: Stacie Huang MRN: [id number] DOB/AGE: October 22, 1940 72 y.o.  Admit Date: 02/23/2013 Referring Physician:  Barbaraann Barthel MD Primary Physician: [name], MD Consulting Cardiologist: Nona Dell MD Reason for Consultation: Peri-operative Coumadin dosing  Clinical Summary Stacie Huang is a 72 y.o.female admitted for biopsy of invasive ductal carcinoma of the left breast, and sentinel lymph node biopsy. The Stacie Huang is followed by our practice for atrial fibrillation on chronic Coumadin therapy. Stacie Huang was last in the office on 02/11/2013 where Stacie Huang was cleared from a cardiac standpoint to proceed with surgery stopping Coumadin for 5 days prior. At that time the recommendation was to restart Coumadin day of surgery at the dose Stacie Huang was taking prior to stopping it pending surgery date, which was documented on 02/18/2013. No Lovenox bridging was recommended. The Stacie Huang had surgery today with pathology report pending. We are asked for consultation concerning reinstitution of Coumadin.   Allergies  Allergen Reactions  . Iohexol Hives  . Sulfa Antibiotics Rash    Medications Scheduled Medications: . [START ON 02/24/2013] atenolol  50 mg Oral QAC breakfast  . calcium carbonate  1 tablet Oral q morning - 10a  . cholecalciferol  400 Units Oral q morning - 10a  . clobetasol ointment  1 application Topical QHS  . [START ON 02/24/2013] diltiazem  120 mg Oral q morning - 10a  . fluticasone  2 spray Each Nare QHS  . loratadine  10 mg Oral Daily  . losartan  50 mg Oral q morning - 10a  . pantoprazole  40 mg Oral Daily  . simvastatin  10 mg Oral QHS  . warfarin  2.5 mg Oral Once  . Warfarin - Pharmacist Dosing Inpatient   Does not apply Q24H    Infusions: . 0.9 % NaCl with KCl 20 mEq / L 50 mL/hr at 02/23/13 1529    PRN Medications: LORazepam, morphine injection, ondansetron (ZOFRAN) IV, ondansetron   Past Medical History  Diagnosis Date    . Hypertension   . Paroxysmal atrial fibrillation   . Chronic anticoagulation   . Hyperlipidemia   . GERD (gastroesophageal reflux disease)   . Cancer     Past Surgical History  Procedure Laterality Date  . Abdominal hysterectomy    . Cholecystectomy      Family History  Problem Relation Age of Onset  . Heart disease Mother     Also 5 of 9 siblings  . Heart attack Father   . Cancer      2 siblings  . Leukemia      Social History Stacie Huang reports that Stacie Huang has never smoked. Stacie Huang does not have any smokeless tobacco history on file. Stacie Huang reports that Stacie Huang does not drink alcohol.  Review of Systems Otherwise reviewed and negative except as outlined.  Physical Examination Blood pressure 113/58, pulse 62, temperature 97.9 F (36.6 C), temperature source Oral, resp. rate 13, height 5\' 2"  (1.575 m), weight 163 lb 14.4 oz (74.345 kg), SpO2 96.00%.  Intake/Output Summary (Last 24 hours) at 02/23/13 1622 Last data filed at 02/23/13 1233  Gross per 24 hour  Intake   1100 ml  Output     50 ml  Net   1050 ml    Telemetry: Atrial fibrillation rates in the 70's-80's.  HEENT: Conjunctiva and lids normal, oropharynx clear with moist mucosa. Nauseated with recent vomiting episode. Neck: Supple, no elevated JVP or carotid bruits, no thyromegaly. Lungs: Clear to auscultation, nonlabored breathing  at rest. Cardiac: Irregular rate and rhythm, no S3 or significant systolic murmur, no pericardial rub. Abdomen: Soft, nontender, no hepatomegaly, bowel sounds present, no guarding or rebound. Extremities: No pitting edema, distal pulses 2+. Skin: Warm and dry. Musculoskeletal: No kyphosis. The dressing over the left upper chest at the wrist area, tender. Neuropsychiatric: Alert and oriented x3, affect grossly appropriate.   Nuclear Stress Test 11/08/2009 IMPRESSION: Negative stress nuclear myocardial study revealing impaired exercise capacity, a rapid increase in heart rate at low  level exercise consistent with physical deconditioning, no significant stress-induced EKG abnormalities, normal left ventricular size and function and normal myocardial perfusion except for mild breast attenuation artifact. Other findings as noted.   Lab Results  Basic Metabolic Panel:  Recent Labs Lab 02/21/13 1250  NA 137  K 4.6  CL 101  CO2 26  GLUCOSE 108*  BUN 14  CREATININE 0.96  CALCIUM 9.7    CBC:  Recent Labs Lab 02/21/13 1250  WBC 7.8  NEUTROABS 2.9  HGB 13.9  HCT 41.9  MCV 94.6  PLT 257    Radiology: Nm Sentinel Node Inj-no Rpt (breast)  02/23/2013   CLINICAL DATA: Left breast cancer   Sulfur colloid was injected intradermally by the nuclear medicine  technologist for breast cancer sentinel node localization.    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  EXAM: NEEDLE LOCALIZATION OF THE LEFT BREAST WITH MAMMO GUIDANCE  COMPARISON:  Previous exams.  FINDINGS: Stacie Huang presents for needle localization prior to left lumpectomy. I met with the Stacie Huang and we discussed the procedure of needle localization including benefits and alternatives. We discussed the high likelihood of a successful procedure. We discussed the risks of the procedure, including infection, bleeding, tissue injury, and further surgery. Informed, written consent was given. The usual time-out protocol was performed immediately prior to the procedure.  Using mammographic guidance, sterile technique, 2% lidocaine and a 9 cm modified Kopans needle, the recently placed hourglass shaped biopsy marker clip in the upper inner left breast with localized using a medial approach. The films were marked for Dr. Malvin Johns.  Specimen radiograph was performed at [facility], and confirms the biopsy marker clip, wire tip and a possible small irregular mass present in the tissue sample. The biopsy marker clip  and a possible small irregular mass were marked for pathology.  IMPRESSION: Needle localization left breast. No apparent complications.   Electronically Signed   By: Gordan Payment M.D.   On: 02/23/2013 10:58     Impression and Recommendations:  1. Atrial fibrillation: Heart rate is well-controlled currently, would continue diltiazem 120 mg daily, and as previously recommended, would restart Coumadin at dose Stacie Huang was taking prior to surgery. That is 2.5 mg, one half tablet daily (1.25 milligrams) except for one tablet, (2.5 mg) on Saturday. INR today 1.17. Previous therapeutic INR was on 01/24/2013 at 2.2. Will have pharmacy consultation for monitoring of dosing and INR per protocol.  2. S/P Left breast lumpectomy, sentinel node biopsy: Per Dr. Malvin Johns for postoperative treatment.  3. Hypertension: Blood pressure low normal. Continue to monitor.  Signed: Bettey Mare. Lyman Bishop NP [facility] 02/23/2013, 4:22 PM Co-Sign MD   Attending note:  Stacie Huang seen and examined. Modified above note by Ms. Lawrence NP. Reviewed records. Stacie Huang now status post left partial mastectomy with sentinel node biopsy and left axillary dissection, pathology pending.  Stacie Huang is in atrial fibrillation with controlled ventricular response. Plan is to reinitiate Coumadin at her presurgical stable dose, and have pharmacy follow for adjustments.  Jonelle Sidle, M.D., F.A.C.C.

## 2013-02-23 NOTE — Anesthesia Preprocedure Evaluation (Signed)
Anesthesia Evaluation  Patient identified by MRN, date of birth, ID band Patient awake    Reviewed: Allergy & Precautions, H&P , NPO status , Patient's Chart, lab work & pertinent test results  Airway Mallampati: II  Neck ROM: Full    Dental  (+) Partial Lower and Partial Upper   Pulmonary  breath sounds clear to auscultation        Cardiovascular hypertension, Pt. on medications + dysrhythmias Atrial Fibrillation Rhythm:Irregular Rate:Normal     Neuro/Psych    GI/Hepatic GERD-  Medicated and Controlled,  Endo/Other    Renal/GU      Musculoskeletal   Abdominal   Peds  Hematology   Anesthesia Other Findings   Reproductive/Obstetrics                           Anesthesia Physical Anesthesia Plan  ASA: III  Anesthesia Plan: General   Post-op Pain Management:    Induction: Intravenous  Airway Management Planned: Oral ETT  Additional Equipment:   Intra-op Plan:   Post-operative Plan: Extubation in OR  Informed Consent: I have reviewed the patients History and Physical, chart, labs and discussed the procedure including the risks, benefits and alternatives for the proposed anesthesia with the patient or authorized representative who has indicated his/her understanding and acceptance.     Plan Discussed with:   Anesthesia Plan Comments: (Amidate induction )        Anesthesia Quick Evaluation

## 2013-02-23 NOTE — OR Nursing (Signed)
To Mammo for needle loc via w/c 

## 2013-02-23 NOTE — Progress Notes (Signed)
51 yr. Old W. Female with biopsy proven invasive ductal carcinoma of the left breast for wide excision and sentinel lymph node biopsy.  Procedure and risks explained, breast site marked, labs reviewed. INR 1.17, pt 14.7  Pt is on coumadin for chronic atrial fib and has been cleared for surgery by cardiology.  Consent obtained and procedure will be coordinated with radiology and pathology.  No change in H&P dict. # [id number].  Filed Vitals:   02/23/13 0930  BP: 113/75  Pulse:   Temp:   Resp: 27  temo 98.1, 92/min, O2 sat 98%.

## 2013-02-23 NOTE — OR Nursing (Signed)
Per Zada Finders RN, allergy of IV dye reported to Dr. Malvin Johns , but per Dr. Malvin Johns ok to use sentinel node dye.

## 2013-02-23 NOTE — Anesthesia Postprocedure Evaluation (Signed)
  Anesthesia Post-op Note  Patient: Stacie Huang  Procedure(s) Performed: Procedure(s): Left Partial Mastectomy, Left Wide Excision, Left Sentinel Node Biopsy, Left Axillary Disection (Left) LEFT PARTIAL MASTECTOMY WITH AXILLARY SENTINEL LYMPH NODE BIOPSY (Left)  Patient Location: PACU  Anesthesia Type:General  Level of Consciousness: awake and alert   Airway and Oxygen Therapy: Patient Spontanous Breathing and Patient connected to face mask oxygen  Post-op Pain: none  Post-op Assessment: Post-op Vital signs reviewed, Patient's Cardiovascular Status Stable, Respiratory Function Stable, Patent Airway and No signs of Nausea or vomiting  Post-op Vital Signs: Reviewed and stable  Complications: No apparent anesthesia complications

## 2013-02-23 NOTE — Progress Notes (Signed)
Post OP Check  Awake and alert.  Some incisional pain.  Wound clean and dry.   Has voided and has no chest pain or shortness of breath.  Filed Vitals:   02/23/13 1400  BP:   Pulse: 62  Temp:   Resp: 13   BP 109/55 temp 97.7 O2 sat 99%  Doing well post op.  Will resume coumadin tonight.

## 2013-02-23 NOTE — Transfer of Care (Signed)
Immediate Anesthesia Transfer of Care Note  Patient: Stacie Huang  Procedure(s) Performed: Procedure(s): Left Partial Mastectomy, Left Wide Excision, Left Sentinel Node Biopsy, Left Axillary Disection (Left) LEFT PARTIAL MASTECTOMY WITH AXILLARY SENTINEL LYMPH NODE BIOPSY (Left)  Patient Location: PACU  Anesthesia Type:General  Level of Consciousness: awake  Airway & Oxygen Therapy: Patient Spontanous Breathing  Post-op Assessment: Report given to PACU RN  Post vital signs: Reviewed  Complications: No apparent anesthesia complications

## 2013-02-24 DIAGNOSIS — C50919 Malignant neoplasm of unspecified site of unspecified female breast: Secondary | ICD-10-CM | POA: Diagnosis not present

## 2013-02-24 LAB — PROTIME-INR
INR: 1.21 (ref 0.00–1.49)
Prothrombin Time: 15 seconds (ref 11.6–15.2)

## 2013-02-24 LAB — CBC
HCT: 37.6 % (ref 36.0–46.0)
MCH: 31.5 pg (ref 26.0–34.0)
Platelets: 219 10*3/uL (ref 150–400)
RBC: 3.94 MIL/uL (ref 3.87–5.11)
WBC: 8 10*3/uL (ref 4.0–10.5)

## 2013-02-24 LAB — BASIC METABOLIC PANEL
BUN: 11 mg/dL (ref 6–23)
CO2: 25 mEq/L (ref 19–32)
Calcium: 9 mg/dL (ref 8.4–10.5)
Glucose, Bld: 91 mg/dL (ref 70–99)
Sodium: 139 mEq/L (ref 135–145)

## 2013-02-24 MED ORDER — WARFARIN SODIUM 2.5 MG PO TABS: 2.5000 mg | ORAL_TABLET | Freq: Once | ORAL | Status: DC

## 2013-02-24 NOTE — Progress Notes (Signed)
Pt verbalizes understanding of d/c instructions, medications, and follow up information. Pt clearly understands dressing changes. Pt has no questions at this time. IV was d/c without complications. Pt d/c via wheelchair, accompanied by NT and her family. Pt has adequate supplies to change dressing. [name]

## 2013-02-24 NOTE — Progress Notes (Signed)
Patient ambulating to the bathroom with minimal assist.  No c/o pain at this time.

## 2013-02-24 NOTE — Progress Notes (Signed)
POD # 1   Pt. Feels much better.  Good range of motion with Left arm.  Wound clean some blue staining as expected at injection site. Dressing changed and post op instructions given.  Follow up has been arranged with cardiology re her coumadin therapy.  Marland Kitchen.Blood pressure 110/74, pulse 108, temperature 98.6 F (37 C), temperature source Oral, resp. rate 16, height 5\' 2"  (1.575 m), weight 163 lb 14.4 oz (74.345 kg), SpO2 98.00%.  Labs OK  Discharge dictated, dict. #  [id number].

## 2013-02-24 NOTE — Progress Notes (Signed)
Checked on Stacie Huang. Feeling better. No more nausea. She is able to talk in po now. Pharmacy dosing coumadin. She has a follow up appt previously scheduled in our office on November 10th for ongoing management. HR is well controlled.

## 2013-02-24 NOTE — Progress Notes (Signed)
UR Chart Review Completed-- OIB 

## 2013-02-24 NOTE — Progress Notes (Signed)
ANTICOAGULATION CONSULT NOTE   Pharmacy Consult for Coumadin (chronic Rx PTA) Indication: atrial fibrillation  Allergies  Allergen Reactions  . Iohexol Hives  . Sulfa Antibiotics Rash   Patient Measurements: Height: 5\' 2"  (157.5 cm) Weight: 163 lb 14.4 oz (74.345 kg) IBW/kg (Calculated) : 50.1  Vital Signs: Temp: 98.6 F (37 C) (10/30 0549) Temp src: Oral (10/30 0549) BP: 110/74 mmHg (10/30 0549) Pulse Rate: 108 (10/30 0549)  Labs:  Recent Labs  02/21/13 1250 02/23/13 0700 02/24/13 0443  HGB 13.9  --  12.4  HCT 41.9  --  37.6  PLT 257  --  219  LABPROT  --  14.7 15.0  INR  --  1.17 1.21  CREATININE 0.96  --  0.82   Estimated Creatinine Clearance: 57.7 ml/min (by C-G formula based on Cr of 0.82).  Medical History: Past Medical History  Diagnosis Date  . Hypertension   . Paroxysmal atrial fibrillation   . Chronic anticoagulation   . Hyperlipidemia   . GERD (gastroesophageal reflux disease)   . Cancer    Medications:  Prescriptions prior to admission  Medication Sig Dispense Refill  . atenolol (TENORMIN) 100 MG tablet Take 50 mg by mouth daily before breakfast.       . Calcium Carbonate (CALCIUM 600) 1500 MG TABS Take 1 tablet by mouth every morning.       . cetirizine (ZYRTEC) 10 MG tablet Take 10 mg by mouth every evening.       . Cholecalciferol (VITAMIN D) 400 UNITS capsule Take 400 Units by mouth every morning.       . clobetasol cream (TEMOVATE) 0.05 % Apply 1 application topically at bedtime.       Marland Kitchen diltiazem (TIAZAC) 120 MG 24 hr capsule Take 120 mg by mouth every morning.      . fluticasone (FLONASE) 50 MCG/ACT nasal spray Place 2 sprays into the nose at bedtime.       Marland Kitchen LORazepam (ATIVAN) 1 MG tablet Take 1 mg by mouth daily as needed for anxiety (AND/OR FOR SLEEP).       Marland Kitchen losartan (COZAAR) 50 MG tablet Take 50 mg by mouth every morning.       Marland Kitchen omeprazole (PRILOSEC) 40 MG capsule Take 40 mg by mouth every morning.       . simvastatin (ZOCOR) 10  MG tablet Take 10 mg by mouth at bedtime.        Marland Kitchen warfarin (COUMADIN) 2.5 MG tablet Take 0.5-1 tablets (1.25-2.5 mg total) by mouth every evening. Take 1/2 tablet daily except 1 tablet on Saturdays  30 tablet  6   Assessment: Stacie Huang who is on chronic Coumadin for h/o afib.  Home dose is listed above.  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  Coumadin was held x 5 days prior to surgery.  Asked to resume Coumadin.    Goal of Therapy:  INR 2-3 Monitor platelets by anticoagulation protocol: Yes   Plan:  Coumadin 2.5mg  today x 1 INR daily Monitor CBC and any s/sx of bleeding complications.  Margo Aye, Gayland Nicol A 02/24/2013,8:04 AM

## 2013-02-24 NOTE — Anesthesia Postprocedure Evaluation (Signed)
  Anesthesia Post-op Note  Patient: Stacie Huang  Procedure(s) Performed: Procedure(s): LEFT PARTIAL MASTECTOMY WITH AXILLARY SIMPLE NODE BIOPSY, LEFT BREAST WIDE EXCISION (Left) LEFT AXILLARY LYMPH NODE DISSECTION (Left)  Patient Location: Room 340  Anesthesia Type:General  Level of Consciousness: awake, alert , oriented and patient cooperative  Airway and Oxygen Therapy: Patient Spontanous Breathing  Post-op Pain: mild  Post-op Assessment: Post-op Vital signs reviewed, Patient's Cardiovascular Status Stable, Respiratory Function Stable, Patent Airway, No signs of Nausea or vomiting and Pain level controlled  Post-op Vital Signs: Reviewed and stable  Complications: No apparent anesthesia complications

## 2013-02-24 NOTE — Op Note (Deleted)
NAME:  Stacie Huang, Stacie Huang                  ACCOUNT NO.:  [id number]  MEDICAL RECORD NO.:  [id number]  LOCATION:  RAD                           FACILITY:  [facility]  PHYSICIAN:  Gregoire Bennis, M.D. DATE OF BIRTH:  02/25/1941  DATE OF PROCEDURE:  02/23/2013 DATE OF DISCHARGE:  02/23/2013                              OPERATIVE REPORT   DIAGNOSIS:  Invasive ductal carcinoma of the left breast.  PROCEDURE:  Needle localization with left partial mastectomy, sentinel node biopsy, and axillary dissection.  Note, this is a 72-year-old white female who had an abnormal left mammogram.  She had undergone core biopsy for these abnormal calcifications in her left breast and the pathology revealed invasive ductal carcinoma.  We then planned for sentinel node biopsy and wide excision procedure on her after discussing the complications with her in detail including bleeding, infection, the possibility of further surgery, and chemotherapy would be necessary.  The patient also had chronic atrial fibrillation and we obtained a Cardiology consult perioperatively and Cardiology will follow her as well regarding her Coumadin resumption.  GROSS OPERATIVE FINDINGS:  The patient's specimen mammography revealed that the marker previously left by the Radiology Department was included in the specimen and final pathology is pending on this.  We sent a tissue that was marked first sentinel node, this was likely more ductal tissue and no lymph node was identified within this, but I did identify clearly another lymph node in her left axilla, which was reported as negative on frozen section for cancer.  I did send other axillary tissue that was dissected from her left axilla for permanent section.  This was tissue that I was not convinced with using the Geiger counter device that this was adequate to be included as a sentinel node, however, since it likely contained smaller nodes, it would be also good to help in  the staging process.  TECHNIQUE:  The patient was placed in supine position.  After the adequate administration of general anesthesia via endotracheal intubation, her left hemithorax and axilla area were prepped with Betadine solution and draped in usual manner using a limb isolator as well.  The blue dye was injected 5 mL around the needle wire site and rubbed for 5 minutes and then we after prepping with Betadine and draping in usual manner, a transverse incision was carried out over the wire and the tissue around the wire was removed and sent as a specimen, and Dr. Reid from Radiology reported that this contained the identifying marker.  Our attention was then turned to the axilla where another incision was carried out and we dissected and we did not find clearly a lymph node.  There was a high marking in the area of the first dissection, which was probably since it was near the nipple suspect more ducts than ductal tissue was the reason for the high Geiger counter numbers.  We sent this tissue and no axillary nodes were actually encountered.  The second sentinel node tissue however was clearly a sentinel node in my opinion and with high markers in the 900s when we were doing the dissection in the search.  I also sent additional left axillary   tissue as mentioned above for the possibility that there might be smaller nodes that might help on the staging process.  After we got the report from the pathologist and it was negative for any cancer, we then irrigated with sterile water, closed the breast tissue with 3-0 Polysorb and all incisions with 5-0 subcuticular sutures with Steri- Strips applied and Neosporin and a sterile dressing.  No drain was placed.  Prior to closure, all sponge, needle, and instrument counts were found to be correct.  Estimated blood loss was less than 50 mL.  The patient received approximately 1100 mL of crystalloids intraoperatively.  No drains were placed and  there were no complications.     Delshon Blanchfield, M.D.     WB/MEDQ  D:  02/23/2013  T:  02/24/2013  Job:  [id number]  cc:   Stephen D. Knowlton, M.D. Fax: [phone]  [facility] 

## 2013-02-24 NOTE — Op Note (Signed)
NAME:  Stacie Huang, Stacie Huang                  ACCOUNT NO.:  [id number]  MEDICAL RECORD NO.:  [id number]  LOCATION:  RAD                           FACILITY:  [facility]  PHYSICIAN:  Barbaraann Barthel, M.D. DATE OF BIRTH:  20-Sep-1940  DATE OF PROCEDURE:  02/23/2013 DATE OF DISCHARGE:  02/23/2013                              OPERATIVE REPORT   DIAGNOSIS:  Invasive ductal carcinoma of the left breast.  PROCEDURE:  Needle localization with left partial mastectomy, sentinel node biopsy, and axillary dissection.  Note, this is a 72 year old white female who had an abnormal left mammogram.  She had undergone core biopsy for these abnormal calcifications in her left breast and the pathology revealed invasive ductal carcinoma.  We then planned for sentinel node biopsy and wide excision procedure on her after discussing the complications with her in detail including bleeding, infection, the possibility of further surgery, and chemotherapy would be necessary.  The patient also had chronic atrial fibrillation and we obtained a Cardiology consult perioperatively and Cardiology will follow her as well regarding her Coumadin resumption.  GROSS OPERATIVE FINDINGS:  The patient's specimen mammography revealed that the marker previously left by the Radiology Department was included in the specimen and final pathology is pending on this.  We sent a tissue that was marked first sentinel node, this was likely more ductal tissue and no lymph node was identified within this, but I did identify clearly another lymph node in her left axilla, which was reported as negative on frozen section for cancer.  I did send other axillary tissue that was dissected from her left axilla for permanent section.  This was tissue that I was not convinced with using the [employer] counter device that this was adequate to be included as a sentinel node, however, since it likely contained smaller nodes, it would be also good to help in  the staging process.  TECHNIQUE:  The patient was placed in supine position.  After the adequate administration of general anesthesia via endotracheal intubation, her left hemithorax and axilla area were prepped with Betadine solution and draped in usual manner using a limb isolator as well.  The blue dye was injected 5 mL around the needle wire site and rubbed for 5 minutes and then we after prepping with Betadine and draping in usual manner, a transverse incision was carried out over the wire and the tissue around the wire was removed and sent as a specimen, and Dr. Azucena Kuba from Radiology reported that this contained the identifying marker.  Our attention was then turned to the axilla where another incision was carried out and we dissected and we did not find clearly a lymph node.  There was a high marking in the area of the first dissection, which was probably since it was near the nipple suspect more ducts than ductal tissue was the reason for the high [employer] numbers.  We sent this tissue and no axillary nodes were actually encountered.  The second sentinel node tissue however was clearly a sentinel node in my opinion and with high markers in the 900s when we were doing the dissection in the search.  I also sent additional left axillary  tissue as mentioned above for the possibility that there might be smaller nodes that might help on the staging process.  After we got the report from the pathologist and it was negative for any cancer, we then irrigated with sterile water, closed the breast tissue with 3-0 Polysorb and all incisions with 5-0 subcuticular sutures with Steri- Strips applied and Neosporin and a sterile dressing.  No drain was placed.  Prior to closure, all sponge, needle, and instrument counts were found to be correct.  Estimated blood loss was less than 50 mL.  The patient received approximately 1100 mL of crystalloids intraoperatively.  No drains were placed and  there were no complications.     Barbaraann Barthel, M.D.     WB/MEDQ  D:  02/23/2013  T:  02/24/2013  Job:  [id number]  cc:   Mila Homer. Sudie Bailey, M.D. Fax: [phone]  [facility] Card[facility]

## 2013-02-25 ENCOUNTER — Encounter (HOSPITAL_COMMUNITY): Payer: Self-pay | Admitting: General Surgery

## 2013-02-25 ENCOUNTER — Telehealth: Payer: Self-pay

## 2013-02-25 NOTE — Telephone Encounter (Signed)
Patient had surgery on Wed for cancer (breast).  Patient needs to know coumadin instructions, as coumadin was stopped.  Patient appointment moved to [facility] 11.03.

## 2013-02-25 NOTE — Telephone Encounter (Signed)
Been in [facility] for breast surgery.  D/C INR on 10/30 was 1.2.  Pt took coumadin 1.25mg  last night.  Told pt to take 2.5mg  daily till 11/3 and come for INR check that day.  Pt verbalized understanding.

## 2013-02-25 NOTE — Discharge Summary (Signed)
NAME:  Stacie Huang, Stacie Huang NO.:  [id number]  MEDICAL RECORD NO.:  [id number]  LOCATION:  A340                          FACILITY:  [facility]  PHYSICIAN:  Barbaraann Barthel, M.D. DATE OF BIRTH:  1940/09/12  DATE OF ADMISSION:  02/23/2013 DATE OF DISCHARGE:  10/30/2014LH                              DISCHARGE SUMMARY   DIAGNOSIS:  Invasive ductal carcinoma of the left breast.  PROCEDURE:  On February 23, 2013, needle localization wide excision, (partial mastectomy) sentinel node biopsy with left axillary dissection.  NOTE:  This is a 72 year old, white female who had an abnormal left mammogram.  Needle localization was performed for excision of the area of abnormality in her left breast.  Prior to this, she had a core biopsy, as an outpatient revealing an invasive ductal carcinoma. Intraoperatively, we had no problems removing the marker showing [place] the position where the area of abnormality was in the left breast.  We then performed the sentinel node biopsy.  This was somewhat difficult, it was hard to find lymph nodes, which were readily available.  We did, however, find a good sentinel lymph node with appropriate Geiger counter clicks indicating a node of interest.  The frozen section revealed no sign of cancer within this node.  I did do some additional dissection in the area while looking for the node in her left axilla and this was sent as well for permanent section.  The patient postoperatively did well.  I did admit her overnight as she had cardiac concerns with her chronic atrial fibrillation and she also had some difficulty with the home care. She was discharged less than 24 hours on the following day.  At the time of discharge, she had some expected ecchymosis of her wound, she had some staining around the incision where the blue dye was injected for the sentinel node procedure, otherwise her wound was fine, she had good range of motion with her left arm and minimal  discomfort which required very little analgesia.  She will be discharged actually on Extra- Strength Tylenol and hot water bottle and ice packs as needed.  She is comfortable like this.  She has been told to follow up with Cardiology regarding her continued Coumadin therapy for her chronic atrial fib and this has been arranged.  Otherwise at time of discharge, she was tolerating p.o. well.  Her labs postoperatively were fine.  Her white count was 8.0 with an H and H of 12.4 and 37.6 with her electrolytes within normal limits.  Her INR was 1.21 on February 24, 2013.  We will follow her perioperatively and she is to also be followed by Dr. Sudie Bailey medically and we will make postoperative arrangements for Oncology consultation, and she is to follow up with [facility] for her Coumadin.     Barbaraann Barthel, M.D.    WB/MEDQ  D:  02/24/2013  T:  02/25/2013  Job:  [id number]

## 2013-02-28 ENCOUNTER — Ambulatory Visit (INDEPENDENT_AMBULATORY_CARE_PROVIDER_SITE_OTHER): Payer: Medicare Other | Admitting: *Deleted

## 2013-02-28 DIAGNOSIS — Z5181 Encounter for therapeutic drug level monitoring: Secondary | ICD-10-CM

## 2013-02-28 DIAGNOSIS — Z7901 Long term (current) use of anticoagulants: Secondary | ICD-10-CM

## 2013-02-28 LAB — POCT INR: INR: 1.7

## 2013-03-14 ENCOUNTER — Ambulatory Visit (INDEPENDENT_AMBULATORY_CARE_PROVIDER_SITE_OTHER): Payer: Medicare Other | Admitting: *Deleted

## 2013-03-14 DIAGNOSIS — Z7901 Long term (current) use of anticoagulants: Secondary | ICD-10-CM

## 2013-03-16 ENCOUNTER — Encounter (HOSPITAL_COMMUNITY): Payer: Medicare Other | Attending: Hematology and Oncology

## 2013-03-16 ENCOUNTER — Encounter (HOSPITAL_COMMUNITY): Payer: Self-pay

## 2013-03-16 VITALS — Ht 61.25 in | Wt 155.4 lb

## 2013-03-16 DIAGNOSIS — C50912 Malignant neoplasm of unspecified site of left female breast: Secondary | ICD-10-CM

## 2013-03-16 DIAGNOSIS — Z7901 Long term (current) use of anticoagulants: Secondary | ICD-10-CM

## 2013-03-16 DIAGNOSIS — C50911 Malignant neoplasm of unspecified site of right female breast: Secondary | ICD-10-CM | POA: Insufficient documentation

## 2013-03-16 DIAGNOSIS — I4891 Unspecified atrial fibrillation: Secondary | ICD-10-CM

## 2013-03-16 DIAGNOSIS — E559 Vitamin D deficiency, unspecified: Secondary | ICD-10-CM

## 2013-03-16 DIAGNOSIS — C50212 Malignant neoplasm of upper-inner quadrant of left female breast: Secondary | ICD-10-CM

## 2013-03-16 DIAGNOSIS — C50219 Malignant neoplasm of upper-inner quadrant of unspecified female breast: Secondary | ICD-10-CM | POA: Insufficient documentation

## 2013-03-16 LAB — COMPREHENSIVE METABOLIC PANEL
ALT: 15 U/L (ref 0–35)
AST: 23 U/L (ref 0–37)
Alkaline Phosphatase: 67 U/L (ref 39–117)
BUN: 10 mg/dL (ref 6–23)
CO2: 27 mEq/L (ref 19–32)
Chloride: 104 mEq/L (ref 96–112)
GFR calc Af Amer: 66 mL/min — ABNORMAL LOW (ref >90)
GFR calc non Af Amer: 57 mL/min — ABNORMAL LOW (ref >90)
Glucose, Bld: 109 mg/dL — ABNORMAL HIGH (ref 70–99)
Potassium: 4.2 mEq/L (ref 3.5–5.1)
Sodium: 140 mEq/L (ref 135–145)
Total Protein: 7.4 g/dL (ref 6.0–8.3)

## 2013-03-16 LAB — CBC WITH DIFFERENTIAL/PLATELET
Eosinophils Absolute: 0.3 10*3/uL (ref 0.0–0.7)
Eosinophils Relative: 5 % (ref 0–5)
HCT: 40.7 % (ref 36.0–46.0)
Lymphocytes Relative: 51 % — ABNORMAL HIGH (ref 12–46)
Lymphs Abs: 3.1 10*3/uL (ref 0.7–4.0)
MCH: 30.1 pg (ref 26.0–34.0)
MCHC: 31.7 g/dL (ref 30.0–36.0)
Monocytes Relative: 10 % (ref 3–12)
Neutrophils Relative %: 34 % — ABNORMAL LOW (ref 43–77)
Platelets: 299 10*3/uL (ref 150–400)
RBC: 4.28 MIL/uL (ref 3.87–5.11)
WBC: 6.1 10*3/uL (ref 4.0–10.5)

## 2013-03-16 LAB — D-DIMER, QUANTITATIVE: D-Dimer, Quant: 0.39 ug/mL-FEU (ref 0.00–0.48)

## 2013-03-16 NOTE — Progress Notes (Signed)
[facility] Earl Lites A. Zigmund Daniel, M.D.  NEW PATIENT EVALUATION   Name: MARICELLA FILYAW Date: 03/16/2013 MRN: [id number] DOB: July 30, 1940  PCP: [name], MD   REFERRING PHYSICIAN: Marlane Hatcher, MD  REASON FOR REFERRAL: Newly diagnosed breast cancer     HISTORY OF PRESENT ILLNESS:Shanekia A Thunder is a 72 y.o. female who is referred by her surgeon after undergoing lumpectomy and sentinel node biopsy for mammographically detected left breast cancer, 0.7 cm, ER/PR positive, HER-2/neu overexpressed, node-negative with surgery performed on 02/23/2013. She denies any significant pain, lymphedema, bleeding, or purulence associated with her surgical wounds. Appetite is good with no cough, shortness of breath, PND, orthopnea, or palpitations. She denies any lower extremity swelling or redness, cough, wheezing, abdominal pain, nausea, vomiting, diarrhea, constipation, dysuria, hematuria, incontinence, peripheral paresthesias, skin rash, headache, or seizures.   PAST MEDICAL HISTORY:  has a past medical history of Hypertension; Paroxysmal atrial fibrillation; Chronic anticoagulation; Hyperlipidemia; GERD (gastroesophageal reflux disease); Cancer; and Breast cancer.     PAST SURGICAL HISTORY: Past Surgical History  Procedure Laterality Date  . Abdominal hysterectomy    . Cholecystectomy    . Partial mastectomy with axillary sentinel lymph node biopsy Left 02/23/2013    Procedure: LEFT PARTIAL MASTECTOMY WITH AXILLARY SIMPLE NODE BIOPSY, LEFT BREAST WIDE EXCISION;  Surgeon: Marlane Hatcher, MD;  Location: [facility];  Service: General;  Laterality: Left;  . Axillary lymph node dissection Left 02/23/2013    Procedure: LEFT AXILLARY LYMPH NODE DISSECTION;  Surgeon: Marlane Hatcher, MD;  Location: [facility];  Service: General;  Laterality: Left;     CURRENT MEDICATIONS: has a current medication list which includes the following prescription(s):  atenolol, calcium carbonate, cetirizine, vitamin d, clobetasol cream, diltiazem, fluticasone, lorazepam, losartan, omeprazole, simvastatin, and warfarin.   ALLERGIES: Iohexol and Sulfa antibiotics   SOCIAL HISTORY:  reports that she has never smoked. She has never used smokeless tobacco. She reports that she does not drink alcohol or use illicit drugs.   FAMILY HISTORY: family history includes Cancer in an other family member; Heart attack in her father; Heart disease in her mother; Leukemia in an other family member.    REVIEW OF SYSTEMS:  Other than that discussed above is noncontributory.    PHYSICAL EXAM:  height is 5' 1.25" (1.556 m) and weight is 155 lb 6.4 oz (70.489 kg).    GENERAL:alert, no distress and comfortable SKIN: skin color, texture, turgor are normal, no rashes or significant lesions EYES: normal, Conjunctiva are pink and non-injected, sclera clear OROPHARYNX:no exudate, no erythema and lips, buccal mucosa, and tongue normal  NECK: supple, thyroid normal size, non-tender, without nodularity CHEST: Status post left mastectomy with no subcutaneous nodules. Right breast without mass. LYMPH:  no palpable lymphadenopathy in the cervical, axillary or inguinal LUNGS: clear to auscultation and percussion with normal breathing effort HEART: regular rate & rhythm and no murmurs ABDOMEN:abdomen soft, non-tender and normal bowel sounds MUSCULOSKELETALl:no cyanosis of digits, no clubbing or edema  NEURO: alert & oriented x 3 with fluent speech, no focal motor/sensory deficits    LABORATORY DATA:  Anti-coag visit on 03/14/2013  Component Date Value Range Status  . INR 03/14/2013 1.6   Final  Anti-coag visit on 02/28/2013  Component Date Value Range Status  . INR 02/28/2013 1.7   Final  Admission on 02/23/2013, Discharged on 02/24/2013  Component Date Value Range Status  . Prothrombin Time 02/23/2013 14.7  11.6 -  15.2 seconds Final  . INR 02/23/2013 1.17  0.00 - 1.49  Final  . WBC 02/24/2013 8.0  4.0 - 10.5 K/uL Final  . RBC 02/24/2013 3.94  3.87 - 5.11 MIL/uL Final  . Hemoglobin 02/24/2013 12.4  12.0 - 15.0 g/dL Final  . HCT 11/91/4782 37.6  36.0 - 46.0 % Final  . MCV 02/24/2013 95.4  78.0 - 100.0 fL Final  . MCH 02/24/2013 31.5  26.0 - 34.0 pg Final  . MCHC 02/24/2013 33.0  30.0 - 36.0 g/dL Final  . RDW 95/62/1308 13.9  11.5 - 15.5 % Final  . Platelets 02/24/2013 219  150 - 400 K/uL Final  . Sodium 02/24/2013 139  135 - 145 mEq/L Final  . Potassium 02/24/2013 4.2  3.5 - 5.1 mEq/L Final  . Chloride 02/24/2013 105  96 - 112 mEq/L Final  . CO2 02/24/2013 25  19 - 32 mEq/L Final  . Glucose, Bld 02/24/2013 91  70 - 99 mg/dL Final  . BUN 65/78/4696 11  6 - 23 mg/dL Final  . Creatinine, Ser 02/24/2013 0.82  0.50 - 1.10 mg/dL Final  . Calcium 29/52/8413 9.0  8.4 - 10.5 mg/dL Final  . GFR calc non Af Amer 02/24/2013 69* >90 mL/min Final  . GFR calc Af Amer 02/24/2013 80* >90 mL/min Final   Comment: (NOTE)                          The eGFR has been calculated using the CKD EPI equation.                          This calculation has not been validated in all clinical situations.                          eGFR's persistently <90 mL/min signify possible Chronic Kidney                          Disease.  Marland Kitchen Prothrombin Time 02/24/2013 15.0  11.6 - 15.2 seconds Final  . INR 02/24/2013 1.21  0.00 - 1.49 Final  Hospital Outpatient Visit on 02/21/2013  Component Date Value Range Status  . WBC 02/21/2013 7.8  4.0 - 10.5 K/uL Final  . RBC 02/21/2013 4.43  3.87 - 5.11 MIL/uL Final  . Hemoglobin 02/21/2013 13.9  12.0 - 15.0 g/dL Final  . HCT 24/40/1027 41.9  36.0 - 46.0 % Final  . MCV 02/21/2013 94.6  78.0 - 100.0 fL Final  . MCH 02/21/2013 31.4  26.0 - 34.0 pg Final  . MCHC 02/21/2013 33.2  30.0 - 36.0 g/dL Final  . RDW 25/36/6440 13.7  11.5 - 15.5 % Final  . Platelets 02/21/2013 257  150 - 400 K/uL Final  . Neutrophils Relative % 02/21/2013 38* 43 - 77 % Final    . Neutro Abs 02/21/2013 2.9  1.7 - 7.7 K/uL Final  . Lymphocytes Relative 02/21/2013 49* 12 - 46 % Final  . Lymphs Abs 02/21/2013 3.8  0.7 - 4.0 K/uL Final  . Monocytes Relative 02/21/2013 10  3 - 12 % Final  . Monocytes Absolute 02/21/2013 0.8  0.1 - 1.0 K/uL Final  . Eosinophils Relative 02/21/2013 3  0 - 5 % Final  . Eosinophils Absolute 02/21/2013 0.2  0.0 - 0.7 K/uL Final  . Basophils Relative 02/21/2013 1  0 - 1 %  Final  . Basophils Absolute 02/21/2013 0.1  0.0 - 0.1 K/uL Final  . Sodium 02/21/2013 137  135 - 145 mEq/L Final  . Potassium 02/21/2013 4.6  3.5 - 5.1 mEq/L Final  . Chloride 02/21/2013 101  96 - 112 mEq/L Final  . CO2 02/21/2013 26  19 - 32 mEq/L Final  . Glucose, Bld 02/21/2013 108* 70 - 99 mg/dL Final  . BUN 96/07/5407 14  6 - 23 mg/dL Final  . Creatinine, Ser 02/21/2013 0.96  0.50 - 1.10 mg/dL Final  . Calcium 81/19/1478 9.7  8.4 - 10.5 mg/dL Final  . GFR calc non Af Amer 02/21/2013 57* >90 mL/min Final  . GFR calc Af Amer 02/21/2013 66* >90 mL/min Final   Comment: (NOTE)                          The eGFR has been calculated using the CKD EPI equation.                          This calculation has not been validated in all clinical situations.                          eGFR's persistently <90 mL/min signify possible Chronic Kidney                          Disease.    Urinalysis    Component Value Date/Time   COLORURINE YELLOW 03/15/2009 1509   APPEARANCEUR CLEAR 03/15/2009 1509   LABSPEC 1.010 03/15/2009 1509   PHURINE 6.0 03/15/2009 1509   GLUCOSEU NEGATIVE 03/15/2009 1509   HGBUR NEGATIVE 03/15/2009 1509   BILIRUBINUR NEGATIVE 03/15/2009 1509   KETONESUR NEGATIVE 03/15/2009 1509   PROTEINUR NEGATIVE 03/15/2009 1509   UROBILINOGEN 0.2 03/15/2009 1509   NITRITE NEGATIVE 03/15/2009 1509   LEUKOCYTESUR NEGATIVE MICROSCOPIC NOT DONE ON URINES WITH NEGATIVE PROTEIN, BLOOD, LEUKOCYTES, NITRITE, OR GLUCOSE <1000 mg/dL. 03/15/2009 1509      @RADIOGRAPHY : MM  Digital Diagnostic Unilat L Status: Final result         PACS Images    Show images for MM Digital Diagnostic Unilat L         Study Result    *RADIOLOGY REPORT*  Clinical Data: The patient returns for evaluation of  calcifications in the medial aspect of the left breast noted on  recent screening mammogram dated 01/10/2013.  DIGITAL DIAGNOSTIC LEFT MAMMOGRAM  Comparison: 01/09/2012, 01/01/2011, 12/21/2009, 12/20/2008  Findings:  ACR Breast Density Category b: There are scattered areas of  fibroglandular density.  There is a 3 x 4 x 5 mm cluster of microcalcifications at 10  o'clock, 7 cm deep to the nipple. These vary in size and shape.  While this may represent a hyalinizing fibroadenoma, biopsy is  suggested. Stereotactic biopsy was discussed with patient and she  agreed with this plan.  IMPRESSION:  Slightly suspicious calcifications in the left upper inner  quadrant.  RECOMMENDATION:  Stereotactic core needle biopsy is suggested. This will be  scheduled at a convenient time for the patient at the [facility].  I have discussed the findings and recommendations with the patient.  Results were also provided in writing at the conclusion of the  visit. If applicable, a reminder letter will be sent to the  patient regarding her next appointment.  BI-RADS CATEGORY 4:  Suspicious abnormality - biopsy should be  considered.  Original Report Authenticated By    Nm Sentinel Node Inj-no Rpt (breast)  02/23/2013   CLINICAL DATA: Left breast cancer   Sulfur colloid was injected intradermally by the nuclear medicine  technologist for breast cancer sentinel node localization.    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  EXAM: NEEDLE LOCALIZATION OF THE LEFT BREAST WITH MAMMO GUIDANCE  COMPARISON:  Previous exams.  FINDINGS: Patient presents for needle  localization prior to left lumpectomy. I met with the patient and we discussed the procedure of needle localization including benefits and alternatives. We discussed the high likelihood of a successful procedure. We discussed the risks of the procedure, including infection, bleeding, tissue injury, and further surgery. Informed, written consent was given. The usual time-out protocol was performed immediately prior to the procedure.  Using mammographic guidance, sterile technique, 2% lidocaine and a 9 cm modified Kopans needle, the recently placed hourglass shaped biopsy marker clip in the upper inner left breast with localized using a medial approach. The films were marked for Dr. Malvin Johns.  Specimen radiograph was performed at [facility], and confirms the biopsy marker clip, wire tip and a possible small irregular mass present in the tissue sample. The biopsy marker clip and a possible small irregular mass were marked for pathology.  IMPRESSION: Needle localization left breast. No apparent complications.   Electronically Signed   By: Gordan Payment M.D.   On: 02/23/2013 10:58    PATHOLOGY: 2084) Patient: ROYALE, LENNARTZ Collected: 02/23/2013 Client: [facility] Accession: [id number] Received: 02/23/2013 Barbaraann Barthel DOB: October 29, 1940 Age: 23 Gender: F Reported: 02/25/2013 [address] Patient Ph: [phone] MRN #: [id number] [place] [address] Visit #: [id number] Chart #: Phone: [phone] Fax: CC: REPORT OF SURGICAL PATHOLOGY FINAL DIAGNOSIS Diagnosis 1. Breast, lumpectomy, left - BENIGN BREAST PARENCHYMA. - HEALING BIOPSY SITE. - SEE ONCOLOGY TABLE BELOW. 2. Soft tissue, biopsy, ? left axillary sentinel node - BENIGN FIBROADIPOSE TISSUE. - LYMPH NODAL TISSUE IS NOT IDENTIFIED. 3. Lymph node, sentinel, biopsy, left axillary - THERE IS NO EVIDENCE OF CARCINOMA IN 1 OF 1 LYMPH NODE (0/1). 4. Fatty tissue, left axillary contents - BENIGN BREAST PARENCHYMA WITH FIBROCYSTIC  CHANGES. - THERE IS NO EVIDENCE OF MALIGNANCY. Microscopic Comment 1. BREAST, INVASIVE TUMOR, WITH LYMPH NODE SAMPLING (BASED ON FINDINGS FROM PREVIOUS BIOPSY [phone]) Specimen, including laterality and lymph node sampling (sentinel, non-sentinel): Left breast and sentinel lymph node Procedure: Core biopsy and subsequent lumpectomy and sentinel lymph node resection Histologic type: Ductal Grade: III Tubule formation: 3 Nuclear pleomorphism: 3 Mitotic: 2 Tumor size (gross measurement or glass slide measurement): 0.7 cm Margins: Invasive, distance to closest margin: Greater than 0.2 cm to all margins Lymphovascular invasion: Not identified Ductal carcinoma in situ: Not identified Lobular neoplasia: Not identified Tumor focality: Unifocal Treatment effect: N/A Extent of tumor: Confined to breast parenchyma 1 of 3 FINAL for Valent, Shalayna A ([id number]) Microscopic Comment(continued) Lymph nodes: Examined: 1 Sentinel 0 Non-sentinel 1 Total Breast prognostic profile: [phone] Estrogen receptor: 99% strong staining intensity Progesterone receptor: 13% strong staining intensity Her 2 neu: Amplification was detected. The ratio was 5.03 Ki-67: 32% Non-neoplastic breast: Healing biopsy site and fibroadenoma TNM: pT1b, pN0 Comments: There are two located areas present in the current lumpectomy specimen. Histologic evaluation of multiple tissue sections from these areas reveals changes consistent  with a previous biopsy as well as a fibroadenoma. Malignancy is not identified in the current specimen. Pecola Leisure MD Pathologist, Electronic Signature (Case signed 02/25/2013) Intraoperative Diagnosis 2. RAPID INTRAOPERATIVE CONSULT: ?LEFT AXILLARY SENTINEL LYMPH NODE, FROZEN SECTION - NO LYMPH NODE PRESENT. (CRR) 3. RAPID INTRAOPERATIVE CONSULT: LEFT AXILLARY SENTINEL LYMPH NODE, FROZEN SECTION - BENIGN LYMPH NODE. (CRR) Total: 2 Specimen Gross and Clinical  Information Specimen(s) Obtained: 1. Breast, lumpectomy, left 2. Soft tissue, biopsy, ? left axillary sentinel node 3. Lymph node, sentinel, biopsy, left axillary 4. Fatty tissue, left axillary contents Specimen Clinical Information 3. invasive ductal carcinoma in left breast 4. invasive ductal carcinoma left breast Gross 1. Specimen type: Left breast lumpectomy. Size: 7.7 x 5 cm, and ranges from 1 to 2.7 cm thick. Orientation: None. All margins inked black. Localized area: There is an insert of wire, and two needles, which are spaced 2.3 cm apart. Cut surface: On sectioning through one localizing needle, there is a 1 x 0.9 x 0.8 cm area of tan-yellow to red indurated, ill-defined tissue, which contains a dumbbell shaped clip. A discrete mass is not identified. On sectioning through the other localization, there is a 1.4 cm in length and up to 0.5 cm in diameter cavity, which contains dark red soft material, and has surrounding tan-yellow to red indurated, ill-defined tissue. A mass or clip is not identified in this cavity and surrounding tissue. Margins: The area of indurated tissue with clip is 0.8 cm from the nearest inked margin. The indurated tissue surrounding the cavity in the other localization, is 0.1 cm from the nearest inked margin. Prognostic indicators: Not taken at the time of gross. Block summary: A - E = area of localized tissue containing clip, with nearest margins. F - J = localized cavity and surrounding tissue, with nearest margins. K = tissue between two localizations. L - N = additional margins near localizations. 2 of 3 FINAL for Penton, Skarlette A ([id number]) Gross(continued) Total, 14 blocks. 2. Received fresh is a 0.8 x 0.7 x 0.5 cm portion of soft to focally indurated fatty tissue. The indurated area is submitted in one block labeled A for frozen section, with remaining fatty tissue submitted in block B for routine histology. Total, two blocks. 3. Rapid  Intraoperative Consult performed (Yes or No): Yes Specimen: Left axillary sentinel node. Number and size: One node, 0.7 cm. Cut Surface(s): Soft, fatty, slightly blue. Block Summary: Bisected and submitted in one block labeled SLN for frozen section. 4. Received in formalin is a 4.4 x 2.5 x 0.6 cm aggregate of soft lobulated fatty tissue with unremarkable cut surfaces. Entirely submitted in four blocks. (SSW:ecj 02/24/2013) Report signed out from the following location(s) Technical Component performed at [facility] [address],[place], [place] [address] CLIA: [id number]., Interpretation performed at [facility] [address], [place], [place] [address]. CLIA #: [id number],  FINAL for JAZIYA, OBARR ([id number]) Patient: MYLINDA, BROOK Collected: 02/04/2013 Client: [facility] Accession: [id number] Received: 02/04/2013 Arnell Sieving, MD DOB: 1940/11/10 Age: 84 Gender: F Reported: 02/07/2013 [address] Patient Ph: [phone] MRN #: [id number] [place], [place] [address] Client Acc#: Chart #: [id number] Phone: [phone] Fax: CC: [name], MD REPORT OF SURGICAL PATHOLOGY ADDITIONAL INFORMATION: PROGNOSTIC INDICATORS - ACIS Results: IMMUNOHISTOCHEMICAL AND MORPHOMETRIC ANALYSIS BY THE AUTOMATED CELLULAR IMAGING SYSTEM (ACIS) Estrogen Receptor: 99%, POSITIVE, STRONG STAINING INTENSITY Progesterone Receptor: 13%, POSITIVE, STRONG STAINING INTENSITY Proliferation Marker Ki67: 32% REFERENCE RANGE ESTROGEN RECEPTOR NEGATIVE <1%  POSITIVE =>1% PROGESTERONE RECEPTOR NEGATIVE <1% POSITIVE =>1% All controls stained appropriately Pecola Leisure MD Pathologist, Electronic Signature ( Signed 02/09/2013) CHROMOGENIC IN-SITU HYBRIDIZATION Results: HER2/NEU BY CISH - SHOWS AMPLIFICATION BY CISH ANALYSIS. RESULT RATIO OF HER2: CEP 17 SIGNALS 5.03 AVERAGE HER2 COPY NUMBER PER CELL 8.30 REFERENCE RANGE 1 of 3 FINAL for MODEST, DRAEGER A  [id number]) ADDITIONAL INFORMATION:(continued) NEGATIVE HER2/Chr17 Ratio <2.0 and Average HER2 copy number <4.0 EQUIVOCAL HER2/Chr17 Ratio <2.0 and Average HER2 copy number 4.0 and <6.0 POSITIVE HER2/Chr17 Ratio >=2.0 and/or Average HER2 copy number >=6.0 Pecola Leisure MD Pathologist, Electronic Signature ( Signed 02/08/2013) FINAL DIAGNOSIS Diagnosis Breast, left, needle core biopsy, UIQ, junction of middle and posterior 1/3 - INVASIVE DUCTAL CARCINOMA WITH ASSOCIATED NECROSIS. - HYALINIZED FIBROADENOMA WITH CALCIFICATIONS. - SEE COMMENT. Microscopic Comment The carcinoma appears at least Grade II. A breast prognostic profile will be performed and the results reported separately. The results were called to [facility] on 02/07/2013. (JBK:ecj 02/07/2013) Pecola Leisure MD Pathologist, Electronic Signature (Case signed 02/07/2013) Specimen Gross and Clinical Information Specimen Comment Indeterminate calcifications In formalin 10:50, extracted 1 min Specimen(s) Obtained: Breast, left, needle core biopsy, UIQ, junction of middle and posterior 1/3 Specimen Clinical Information Dystrophic vs FA vs DCIS Gross Received in formalin are within a coretainer is a 2.7 x 2.3 x 0.5 cm aggregate of soft yellow fibrofatty tissue. The specimen is entirely submitted in two blocks. (TB:gt, 02/04/13) Disclaimer PR progesterone receptor (PgR 636), immunohistochemical stains are performed on formalin fixed, paraffin embedded tissue using a 3,3"-diaminobenzidine (DAB) chromogen and DAKO Autostainer System. The staining intensity of the nucleus is scored morphometrically using the Automated Cellular Imaging System (ACIS) and is reported as the percentage of tumor cell nuclei demonstrating specific nuclear staining. Estrogen receptor (SP1), immunohistochemical stains are performed on formalin fixed, paraffin embedded tissue using a 3,3"-diaminobenzidine (DAB) chromogen and  DAKO Autostainer System. The staining intensity of the nucleus is scored morphometrically using the Automated Cellular Imaging System (ACIS) and is reported as the percentage of tumor cell nuclei demonstrating specific nuclear staining. Ki-67 (Mib-1), immunohistochemical stains are performed on formalin fixed, paraffin embedded tissue using a 3,3"-diaminobenzidine (DAB) chromogen and DAKO Autostainer System. The staining intensity of the nucleus is scored morphometrically using the Automated Cellular Imaging System (ACIS) and is reported as the percentage of tumor cell nuclei demonstrating specific nuclear staining. Her2Neu by CISH (chromogenic in-situ hybridization) is performed at [facility], using the Her2CISH pharmDx Kit (code number [id number]) 2 of 3 FINAL for KIEANNA, ROLLO A [phone]) Report signed out from the following location(s) Technical Component performed at [facility]. [address],[place] [address].CLIA:[id number]., Interpretation performed at [facility] [address] [place], [place], [place] [address]. CLIA #: [id number],  IMPRESSION:  #1. 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 Chronic atrial fibrillation with controlled ventricular response, good tolerance to warfarin. #3. Hypertension, controlled. #4. Allergic rhinitis, on treatment.  PLAN:  #1. A discussion was held with the patient regarding the nature of her disease and the significance of HER-2/neu positivity. Although her tumor is small, accumulative meta-analysis of tumors less than 1 cm treated with Herceptin or treated with non-Herceptin regimens favor the Herceptin regimen. Recent data as presented at the [facility] breast meeting in 2013 from the [facility] indicate that patients treated with paclitaxel  and Herceptin for 12 weekly  treatments followed by Herceptin every 3 weeks for one year tolerated the treatment exceedingly well with excellent disease control with only four recurrences out of 406 patients treated. Hormone responsivity would dictate using Herceptin with anastrozole after completion of the first 12 weeks of treatment. I do not believe radiotherapy is indicated. #2. The patient was referred back to Dr. Malvin Johns for insertion of a LifePort. #3. Baseline MUGA scan has been ordered. Baseline lab tests including tumor markers were done today. #4. Chemotherapy teaching will be effected. Information about Taxol and Herceptin were given to the patient today. #5. She was told to stop warfarin for 4 days prior to insertion of a LifePort and to resume treatment today after. #6. Followup in approximately 3-4 weeks for initiation of therapy.  I appreciate the opportunity of sharing in her care.   Maurilio Lovely, MD 03/16/2013 3:30 PM

## 2013-03-16 NOTE — Progress Notes (Signed)
[name] presented for [employer]. Labs per MD order drawn via Peripheral Line 23 gauge needle inserted in right AC  Good blood return present. Procedure without incident.  Needle removed intact. Patient tolerated procedure well.

## 2013-03-16 NOTE — Patient Instructions (Signed)
[facility] Discharge Instructions  RECOMMENDATIONS MADE BY THE CONSULTANT AND ANY TEST RESULTS WILL BE SENT TO YOUR REFERRING PHYSICIAN.  EXAM FINDINGS BY THE PHYSICIAN TODAY AND SIGNS OR SYMPTOMS TO REPORT TO CLINIC OR PRIMARY PHYSICIAN: Exam and findings as discussed by Dr. Zigmund Daniel. 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  Best treatment option is to give you Herceptin and Taxotere weekly for 12 weeks then Herceptin every 3 weeks for a full year then would give you an anti-hormonal agent for 5 years.  Will need to get a port a catheter inserted by Dr. Malvin Johns and will also need to get MUGA scan done to check your heart function.   MEDICATIONS PRESCRIBED:  none  INSTRUCTIONS/FOLLOW-UP: To be arranged.  Thank you for choosing [facility] to provide your oncology and hematology care.  To afford each patient quality time with our providers, please arrive at least 15 minutes before your scheduled appointment time.  With your help, our goal is to use those 15 minutes to complete the necessary work-up to ensure our physicians have the information they need to help with your evaluation and healthcare recommendations.    Effective January 1st, 2014, we ask that you re-schedule your appointment with our physicians should you arrive 10 or more minutes late for your appointment.  We strive to give you quality time with our providers, and arriving late affects you and other patients whose appointments are after yours.    Again, thank you for choosing [facility].  Our hope is that these requests will decrease the amount of time that you wait before being seen by our physicians.       _____________________________________________________________  Should you have questions after your visit to [facility], please contact our office at [phone] between the hours  of 8:30 a.m. and 5:00 p.m.  Voicemails left after 4:30 p.m. will not be returned until the following business day.  For prescription refill requests, have your pharmacy contact our office with your prescription refill request.

## 2013-03-17 DIAGNOSIS — I480 Paroxysmal atrial fibrillation: Secondary | ICD-10-CM | POA: Insufficient documentation

## 2013-03-18 MED ORDER — DEXAMETHASONE 4 MG PO TABS: ORAL_TABLET | ORAL | Status: DC

## 2013-03-18 MED ORDER — PROCHLORPERAZINE MALEATE 10 MG PO TABS: 10.0000 mg | ORAL_TABLET | Freq: Four times a day (QID) | ORAL | Status: DC | PRN

## 2013-03-18 MED ORDER — METOCLOPRAMIDE HCL 5 MG PO TABS: 5.0000 mg | ORAL_TABLET | Freq: Four times a day (QID) | ORAL | Status: DC | PRN

## 2013-03-18 MED ORDER — LIDOCAINE-PRILOCAINE 2.5-2.5 % EX CREA: TOPICAL_CREAM | CUTANEOUS | Status: DC

## 2013-03-18 NOTE — Patient Instructions (Addendum)
[facility]   CHEMOTHERAPY INSTRUCTIONS  Taxol - the first time you receive this drug we will titrate it very slowly to ensure that you do not have or are not having an allergic reaction to the chemo. You will also be responsible for taking a steroid called Dexamethasone after chemo. This is to reduce your risk of having an allergic reaction to the Taxol. Side Effects: hair loss, lowers your white blood cells (fight infection), muscle aches, nausea/vomiting, irritation to the mouth (mouth sores, pain in your mouth) *neuropathy - numbness/tingling/burning in hands/fingers/feet/toes. We need to know as soon as this begins to happen so that we can monitor it and treat if necessary. The numbness generally begins in the fingertips of tips of toes and then begins to travel up the finger/toe/hand/foot. We never want you getting to where you can't pick up a pen, coin, zip a zipper, button a button, or have trouble walking. You must tell us immediately if you are experiencing peripheral neuropathy!  Herceptin - this is given to patients who overexpress HER2. Side Effects that may occur during infusion include: chills, fever, headache, dizziness, shortness of breath, low blood pressure, rash. This does not generally happen. We will have to perform MUGA scans or 2D echoes periodically during treatment however because a side effect of this drug can be cardiotoxicity.    You will receive Taxol and Herceptin once a week for 12 weeks in a row. Once this is completed, you will only take Herceptin. Herceptin will then be given once every 3 weeks.  POTENTIAL SIDE EFFECTS OF TREATMENT: Increased Susceptibility to Infection/Bone Marrow Suppression, Nausea/Vomiting, Constipation/Diarrhea, Hair Thinning/Hair Loss, Changes in Character of Skin and Nails (brittleness, dryness,etc.), Abdominal Cramping  SELF IMAGE NEEDS AND REFERRALS MADE: Obtain hair accessories as soon as possible (wigs,  scarves, turbans,caps,etc.)   EDUCATIONAL MATERIALS GIVEN AND REVIEWED: Chemotherapy and You Specific Instruction Sheets: Taxol & Herceptin   SELF CARE ACTIVITIES WHILE ON CHEMOTHERAPY: Increase your fluid intake 48 hours prior to treatment and drink at least 2 quarts per day after treatment., No alcohol intake., No aspirin or other medications unless approved by your oncologist., Eat foods that are light and easy to digest., Eat foods at cold or room temperature., No fried, fatty, or spicy foods immediately before or after treatment., Have teeth cleaned professionally before starting treatment. Keep dentures and partial plates clean., Use soft toothbrush and do not use mouthwashes that contain alcohol. Biotene is a good mouthwash. Use warm salt water gargles (1 teaspoon salt per 1 quart warm water) before and after meals and at bedtime. Or you may rinse with 2 tablespoons of three -percent hydrogen peroxide mixed in eight ounces of water., Always use sunscreen with SPF (Sun Protection Factor) of 30 or higher., Use your nausea medication as directed to prevent nausea., Use your stool softener or laxative as directed to prevent constipation. and Use your anti-diarrheal medication as directed to stop diarrhea.  Please wash your hands for at least 30 seconds using warm soapy water. Handwashing is the #1 way to prevent the spread of germs. Stay away from sick people or people who are getting over a cold. If you develop respiratory systems such as green/yellow mucus production or productive cough or persistent cough let us know and we will see if you need an antibiotic. It is a good idea to keep a pair of gloves on when going into grocery stores/[employer] to decrease your risk of coming into contact with  germs on the carts, etc. Carry alcohol hand gel with you at all times and use it frequently if out in public. All foods need to be cooked thoroughly. No raw foods. No medium or undercooked meats, eggs. If your  food is cooked medium well, it does not need to be hot pink or saturated with bloody liquid at all. Vegetables and fruits need to be washed/rinsed under the faucet with a dish detergent before being consumed. You can eat raw fruits and vegetables unless we tell you otherwise but it would be best if you cooked them or bought frozen. Do not eat off of salad bars or hot bars unless you really trust the cleanliness of the restaurant. If you need dental work, please let us know before you go for your appointment so that we can coordinate the best possible time for you in regards to your chemo regimen. You need to also let your dentist know that you are actively taking chemo. We may need to do labs prior to your dental appointment. We also want your bowels moving at least every other day. If this is not happening, we need to know so that we can get you on a bowel regimen to help you go.       MEDICATIONS: You have been given prescriptions for the following medications:  Dexamethasone 4mg  tablet. The day after chemo take 2 tablets in the am and 2 tablets in the pm. Then Stop. Take with food.   Metoclopramide/Reglan 5mg  tablet. May take 1 tablet four times a day as needed for nausea/vomiting.   Prochlorperazine/Compazine 10mg  tablet. May take 1 tablet four times a day as needed for nausea/vomiting.   EMLA cream. Apply a quarter size amount to port site 1 hour prior to chemo. Do not rub in. Cover with plastic wrap.    Over-the-Counter Meds:  Colace - this is a stool softener. Take 100mg  capsule 2-6 times a day as needed. If you have to take more than 6 capsules of Colace a day call the Cancer Center.  Senna - this is a mild laxative used to treat mild constipation. May take up to 3 tablets @ bedtime nightly as needed for constipation.   Milk of Magnesia - this is a laxative used to treat moderate to severe constipation. May take 2-4 tablespoons every 8 hours as needed. May increase to 8 tablespoons x  1 dose and if no bowel movement call the Cancer Center.  Imodium - this is for diarrhea. Take 2 tabs after 1st loose stool and then 1 tab after each loose stool until you go a total of 12 hours without a loose stool. Call Cancer Center if loose stools continue.   SYMPTOMS TO REPORT AS SOON AS POSSIBLE AFTER TREATMENT:  FEVER GREATER THAN 100.5 F  CHILLS WITH OR WITHOUT FEVER  NAUSEA AND VOMITING THAT IS NOT CONTROLLED WITH YOUR NAUSEA MEDICATION  UNUSUAL SHORTNESS OF BREATH  UNUSUAL BRUISING OR BLEEDING  TENDERNESS IN MOUTH AND THROAT WITH OR WITHOUT PRESENCE OF ULCERS  URINARY PROBLEMS  BOWEL PROBLEMS  UNUSUAL RASH    Wear comfortable clothing and clothing appropriate for easy access to any Portacath or PICC line. Let us know if there is anything that we can do to make your therapy better!      I have been informed and understand all of the instructions given to me and have received a copy. I have been instructed to call the clinic [phone] or my family physician as  soon as possible for continued medical care, if indicated. I do not have any more questions at this time but understand that I may call the Cancer Center or the Patient Navigator at [phone] during office hours should I have questions or need assistance in obtaining follow-up care.      _________________________________________      _______________     __________ Signature of Patient or Authorized Representative        Date                            Time      _________________________________________ Nurse's Signature      Paclitaxel injection What is this medicine? PACLITAXEL (PAK li TAX el) is a chemotherapy drug. It targets fast dividing cells, like cancer cells, and causes these cells to die. This medicine is used to treat ovarian cancer, breast cancer, and other cancers. This medicine may be used for other purposes; ask your health care provider or pharmacist if you have  questions. COMMON BRAND NAME(S): Onxol , Taxol What should I tell my health care provider before I take this medicine? They need to know if you have any of these conditions: -blood disorders -irregular heartbeat -infection (especially a virus infection such as chickenpox, cold sores, or herpes) -liver disease -previous or ongoing radiation therapy -an unusual or allergic reaction to paclitaxel, alcohol, polyoxyethylated castor oil, other chemotherapy agents, other medicines, foods, dyes, or preservatives -pregnant or trying to get pregnant -breast-feeding How should I use this medicine? This drug is given as an infusion into a vein. It is administered in a hospital or clinic by a specially trained health care professional. Talk to your pediatrician regarding the use of this medicine in children. Special care may be needed. Overdosage: If you think you have taken too much of this medicine contact a poison control center or emergency room at once. NOTE: This medicine is only for you. Do not share this medicine with others. What if I miss a dose? It is important not to miss your dose. Call your doctor or health care professional if you are unable to keep an appointment. What may interact with this medicine? Do not take this medicine with any of the following medications: -disulfiram -metronidazole This medicine may also interact with the following medications: -cyclosporine -diazepam -ketoconazole -medicines to increase blood counts like filgrastim, pegfilgrastim, sargramostim -other chemotherapy drugs like cisplatin, doxorubicin, epirubicin, etoposide, teniposide, vincristine -quinidine -testosterone -vaccines -verapamil Talk to your doctor or health care professional before taking any of these medicines: -acetaminophen -aspirin -ibuprofen -ketoprofen -naproxen This list may not describe all possible interactions. Give your health care provider a list of all the medicines, herbs,  non-prescription drugs, or dietary supplements you use. Also tell them if you smoke, drink alcohol, or use illegal drugs. Some items may interact with your medicine. What should I watch for while using this medicine? Your condition will be monitored carefully while you are receiving this medicine. You will need important blood work done while you are taking this medicine. This drug may make you feel generally unwell. This is not uncommon, as chemotherapy can affect healthy cells as well as cancer cells. Report any side effects. Continue your course of treatment even though you feel ill unless your doctor tells you to stop. In some cases, you may be given additional medicines to help with side effects. Follow all directions for their use. Call your doctor or health care  professional for advice if you get a fever, chills or sore throat, or other symptoms of a cold or flu. Do not treat yourself. This drug decreases your body's ability to fight infections. Try to avoid being around people who are sick. This medicine may increase your risk to bruise or bleed. Call your doctor or health care professional if you notice any unusual bleeding. Be careful brushing and flossing your teeth or using a toothpick because you may get an infection or bleed more easily. If you have any dental work done, tell your dentist you are receiving this medicine. Avoid taking products that contain aspirin, acetaminophen, ibuprofen, naproxen, or ketoprofen unless instructed by your doctor. These medicines may hide a fever. Do not become pregnant while taking this medicine. Women should inform their doctor if they wish to become pregnant or think they might be pregnant. There is a potential for serious side effects to an unborn child. Talk to your health care professional or pharmacist for more information. Do not breast-feed an infant while taking this medicine. Men are advised not to father a child while receiving this medicine. What  side effects may I notice from receiving this medicine? Side effects that you should report to your doctor or health care professional as soon as possible: -allergic reactions like skin rash, itching or hives, swelling of the face, lips, or tongue -low blood counts - This drug may decrease the number of white blood cells, red blood cells and platelets. You may be at increased risk for infections and bleeding. -signs of infection - fever or chills, cough, sore throat, pain or difficulty passing urine -signs of decreased platelets or bleeding - bruising, pinpoint red spots on the skin, black, tarry stools, nosebleeds -signs of decreased red blood cells - unusually weak or tired, fainting spells, lightheadedness -breathing problems -chest pain -high or low blood pressure -mouth sores -nausea and vomiting -pain, swelling, redness or irritation at the injection site -pain, tingling, numbness in the hands or feet -slow or irregular heartbeat -swelling of the ankle, feet, hands Side effects that usually do not require medical attention (report to your doctor or health care professional if they continue or are bothersome): -bone pain -complete hair loss including hair on your head, underarms, pubic hair, eyebrows, and eyelashes -changes in the color of fingernails -diarrhea -loosening of the fingernails -loss of appetite -muscle or joint pain -red flush to skin -sweating This list may not describe all possible side effects. Call your doctor for medical advice about side effects. You may report side effects to FDA at 1-800-FDA-1088. Where should I keep my medicine? This drug is given in a hospital or clinic and will not be stored at home. NOTE: This sheet is a summary. It may not cover all possible information. If you have questions about this medicine, talk to your doctor, pharmacist, or health care provider.  2014, [employer] Standard. (2012-06-07 16:41:21) Trastuzumab injection for  infusion What is this medicine? TRASTUZUMAB (tras TOO zoo mab) is a monoclonal antibody. It targets a protein called HER2. This protein is found in some stomach and breast cancers. This medicine can stop cancer cell growth. This medicine may be used with other cancer treatments. This medicine may be used for other purposes; ask your health care provider or pharmacist if you have questions. COMMON BRAND NAME(S): Herceptin What should I tell my health care provider before I take this medicine? They need to know if you have any of these conditions: -heart disease -heart failure -infection (  especially a virus infection such as chickenpox, cold sores, or herpes) -lung or breathing disease, like asthma -recent or ongoing radiation therapy -an unusual or allergic reaction to trastuzumab, benzyl alcohol, or other medications, foods, dyes, or preservatives -pregnant or trying to get pregnant -breast-feeding How should I use this medicine? This drug is given as an infusion into a vein. It is administered in a hospital or clinic by a specially trained health care professional. Talk to your pediatrician regarding the use of this medicine in children. This medicine is not approved for use in children. Overdosage: If you think you have taken too much of this medicine contact a poison control center or emergency room at once. NOTE: This medicine is only for you. Do not share this medicine with others. What if I miss a dose? It is important not to miss a dose. Call your doctor or health care professional if you are unable to keep an appointment. What may interact with this medicine? -cyclophosphamide -doxorubicin -warfarin This list may not describe all possible interactions. Give your health care provider a list of all the medicines, herbs, non-prescription drugs, or dietary supplements you use. Also tell them if you smoke, drink alcohol, or use illegal drugs. Some items may interact with your  medicine. What should I watch for while using this medicine? Visit your doctor for checks on your progress. Report any side effects. Continue your course of treatment even though you feel ill unless your doctor tells you to stop. Call your doctor or health care professional for advice if you get a fever, chills or sore throat, or other symptoms of a cold or flu. Do not treat yourself. Try to avoid being around people who are sick. You may experience fever, chills and shaking during your first infusion. These effects are usually mild and can be treated with other medicines. Report any side effects during the infusion to your health care professional. Fever and chills usually do not happen with later infusions. What side effects may I notice from receiving this medicine? Side effects that you should report to your doctor or other health care professional as soon as possible: -breathing difficulties -chest pain or palpitations -cough -dizziness or fainting -fever or chills, sore throat -skin rash, itching or hives -swelling of the legs or ankles -unusually weak or tired Side effects that usually do not require medical attention (report to your doctor or other health care professional if they continue or are bothersome): -loss of appetite -headache -muscle aches -nausea This list may not describe all possible side effects. Call your doctor for medical advice about side effects. You may report side effects to FDA at 1-800-FDA-1088. Where should I keep my medicine? This drug is given in a hospital or clinic and will not be stored at home. NOTE: This sheet is a summary. It may not cover all possible information. If you have questions about this medicine, talk to your doctor, pharmacist, or health care provider.  2014, [employer]/Gold Standard. (2009-02-16 13:43:15) Implanted Port Instructions An implanted port is a central line that has a round shape and is placed under the skin. It is used for  long-term IV (intravenous) access for:  Medicine.  Fluids.  Liquid nutrition, such as TPN (total parenteral nutrition).  Blood samples. Ports can be placed:  In the chest area just below the collarbone (this is the most common place.)  In the arms.  In the belly (abdomen) area.  In the legs. PARTS OF THE PORT A port has  2 main parts:  The reservoir. The reservoir is round, disc-shaped, and will be a small, raised area under your skin.  The reservoir is the part where a needle is inserted (accessed) to either give medicines or to draw blood.  The catheter. The catheter is a long, slender tube that extends from the reservoir. The catheter is placed into a large vein.  Medicine that is inserted into the reservoir goes into the catheter and then into the vein. INSERTION OF THE PORT  The port is surgically placed in either an operating room or in a procedural area (interventional radiology).  Medicine may be given to help you relax during the procedure.  The skin where the port will be inserted is numbed (local anesthetic).  1 or 2 small cuts (incisions) will be made in the skin to insert the port.  The port can be used after it has been inserted. INCISION SITE CARE  The incision site may have small adhesive strips on it. This helps keep the incision site closed. Sometimes, no adhesive strips are placed. Instead of adhesive strips, a special kind of surgical glue is used to keep the incision closed.  If adhesive strips were placed on the incision sites, do not take them off. They will fall off on their own.  The incision site may be sore for 1 to 2 days. Pain medicine can help.  Do not get the incision site wet. Bathe or shower as directed by your caregiver.  The incision site should heal in 5 to 7 days. A small scar may form after the incision has healed. ACCESSING THE PORT Special steps must be taken to access the port:  Before the port is accessed, a numbing cream  can be placed on the skin. This helps numb the skin over the port site.  A sterile technique is used to access the port.  The port is accessed with a needle. Only "non-coring" port needles should be used to access the port. Once the port is accessed, a blood return should be checked. This helps ensure the port is in the vein and is not clogged (clotted).  If your caregiver believes your port should remain accessed, a clear (transparent) bandage will be placed over the needle site. The bandage and needle will need to be changed every week or as directed by your caregiver.  Keep the bandage covering the needle clean and dry. Do not get it wet. Follow your caregiver's instructions on how to take a shower or bath when the port is accessed.  If your port does not need to stay accessed, no bandage is needed over the port. FLUSHING THE PORT Flushing the port keeps it from getting clogged. How often the port is flushed depends on:  If a constant infusion is running. If a constant infusion is running, the port may not need to be flushed.  If intermittent medicines are given.  If the port is not being used. For intermittent medicines:  The port will need to be flushed:  After medicines have been given.  After blood has been drawn.  As part of routine maintenance.  A port is normally flushed with:  Normal saline.  Heparin.  Follow your caregiver's advice on how often, how much, and the type of flush to use on your port. IMPORTANT PORT INFORMATION  Tell your caregiver if you are allergic to heparin.  After your port is placed, you will get a manufacturer's information card. The card has information about your  port. Keep this card with you at all times.  There are many types of ports available. Know what kind of port you have.  In case of an emergency, it may be helpful to wear a medical alert bracelet. This can help alert health care workers that you have a port.  The port can stay  in for as long as your caregiver believes it is necessary.  When it is time for the port to come out, surgery will be done to remove it. The surgery will be similar to how the port was put in.  If you are in the hospital or clinic:  Your port will be taken care of and flushed by a nurse.  If you are at home:  A home health care nurse may give medicines and take care of the port.  You or a family member can get special training and directions for giving medicine and taking care of the port at home. SEEK IMMEDIATE MEDICAL CARE IF:   Your port does not flush or you are unable to get a blood return.  New drainage or pus is coming from the incision.  A bad smell is coming from the incision site.  You develop swelling or increased redness at the incision site.  You develop increased swelling or pain at the port site.  You develop swelling or pain in the surrounding skin near the port.  You have an oral temperature above 102 F (38.9 C), not controlled by medicine. MAKE SURE YOU:   Understand these instructions.  Will watch your condition.  Will get help right away if you are not doing well or get worse. Document Released: 04/14/2005 Document Revised: 07/07/2011 Document Reviewed: 07/06/2008 [facility] Patient Information 2014 [place], [place]. Dexamethasone tablets What is this medicine? DEXAMETHASONE (dex a METH a sone) is a corticosteroid. It is commonly used to treat inflammation of the skin, joints, lungs, and other organs. Common conditions treated include asthma, allergies, and arthritis. It is also used for other conditions, such as blood disorders and diseases of the adrenal glands. This medicine may be used for other purposes; ask your health care provider or pharmacist if you have questions. COMMON BRAND NAME(S): Decadron, DexPak Dorothea Ogle, DexPak TaperPak, Zema-Pak What should I tell my health care provider before I take this medicine? They need to know if you have any  of these conditions: -Cushing's syndrome -diabetes -glaucoma -heart problems or disease -high blood pressure -infection like herpes, measles, tuberculosis, or chickenpox -kidney disease -liver disease -mental problems -myasthenia gravis -osteoporosis -previous heart attack -seizures -stomach, ulcer or intestine disease including colitis and diverticulitis -thyroid problem -an unusual or allergic reaction to dexamethasone, corticosteroids, other medicines, lactose, foods, dyes, or preservatives -pregnant or trying to get pregnant -breast-feeding How should I use this medicine? Take this medicine by mouth with a drink of water. Follow the directions on the prescription label. Take it with food or milk to avoid stomach upset. If you are taking this medicine once a day, take it in the morning. Do not take more medicine than you are told to take. Do not suddenly stop taking your medicine because you may develop a severe reaction. Your doctor will tell you how much medicine to take. If your doctor wants you to stop the medicine, the dose may be slowly lowered over time to avoid any side effects. Talk to your pediatrician regarding the use of this medicine in children. Special care may be needed. Patients over 50 years old may have  a stronger reaction and need a smaller dose. Overdosage: If you think you have taken too much of this medicine contact a poison control center or emergency room at once. NOTE: This medicine is only for you. Do not share this medicine with others. What if I miss a dose? If you miss a dose, take it as soon as you can. If it is almost time for your next dose, talk to your doctor or health care professional. You may need to miss a dose or take an extra dose. Do not take double or extra doses without advice. What may interact with this medicine? Do not take this medicine with any of the following medications: -mifepristone, RU-486 -vaccines This medicine may also  interact with the following medications: -amphotericin B -antibiotics like clarithromycin, erythromycin, and troleandomycin -aspirin and aspirin-like drugs -barbiturates like phenobarbital -carbamazepine -cholestyramine -cholinesterase inhibitors like donepezil, galantamine, rivastigmine, and tacrine -cyclosporine -digoxin -diuretics -ephedrine -female hormones, like estrogens or progestins and birth control pills -indinavir -isoniazid -ketoconazole -medicines for diabetes -medicines that improve muscle tone or strength for conditions like myasthenia gravis -NSAIDs, medicines for pain and inflammation, like ibuprofen or naproxen -phenytoin -rifampin -thalidomide -warfarin This list may not describe all possible interactions. Give your health care provider a list of all the medicines, herbs, non-prescription drugs, or dietary supplements you use. Also tell them if you smoke, drink alcohol, or use illegal drugs. Some items may interact with your medicine. What should I watch for while using this medicine? Visit your doctor or health care professional for regular checks on your progress. If you are taking this medicine over a prolonged period, carry an identification card with your name and address, the type and dose of your medicine, and your doctor's name and address. This medicine may increase your risk of getting an infection. Stay away from people who are sick. Tell your doctor or health care professional if you are around anyone with measles or chickenpox. If you are going to have surgery, tell your doctor or health care professional that you have taken this medicine within the last twelve months. Ask your doctor or health care professional about your diet. You may need to lower the amount of salt you eat. The medicine can increase your blood sugar. If you are a diabetic check with your doctor if you need help adjusting the dose of your diabetic medicine. What side effects may I  notice from receiving this medicine? Side effects that you should report to your doctor or health care professional as soon as possible: -allergic reactions like skin rash, itching or hives, swelling of the face, lips, or tongue -changes in vision -fever, sore throat, sneezing, cough, or other signs of infection, wounds that will not heal -increased thirst -mental depression, mood swings, mistaken feelings of self importance or of being mistreated -pain in hips, back, ribs, arms, shoulders, or legs -redness, blistering, peeling or loosening of the skin, including inside the mouth -trouble passing urine or change in the amount of urine -swelling of feet or lower legs -unusual bleeding or bruising Side effects that usually do not require medical attention (report to your doctor or health care professional if they continue or are bothersome): -headache -nausea, vomiting -skin problems, acne, thin and shiny skin -weight gain This list may not describe all possible side effects. Call your doctor for medical advice about side effects. You may report side effects to FDA at 1-800-FDA-1088. Where should I keep my medicine? Keep out of the reach of children. Store  at room temperature between 20 and 25 degrees C (68 and 77 degrees F). Protect from light. Throw away any unused medicine after the expiration date. NOTE: This sheet is a summary. It may not cover all possible information. If you have questions about this medicine, talk to your doctor, pharmacist, or health care provider.  2014, [employer]. (2007-08-05 14:02:13) Metoclopramide tablets What is this medicine? METOCLOPRAMIDE (met oh kloe PRA mide) is used to treat the symptoms of gastroesophageal reflux disease (GERD) like heartburn. It is also used to treat people with slow emptying of the stomach and intestinal tract. This medicine may be used for other purposes; ask your health care provider or pharmacist if you have  questions. COMMON BRAND NAME(S): Reglan What should I tell my health care provider before I take this medicine? They need to know if you have any of these conditions: -breast cancer -depression -diabetes -heart failure -high blood pressure -kidney disease -liver disease -Parkinson's disease or a movement disorder -pheochromocytoma -seizures -stomach obstruction, bleeding, or perforation -an unusual or allergic reaction to metoclopramide, procainamide, sulfites, other medicines, foods, dyes, or preservatives -pregnant or trying to get pregnant -breast-feeding How should I use this medicine? Take this medicine by mouth with a glass of water. Follow the directions on the prescription label. Take this medicine on an empty stomach, about 30 minutes before eating. Take your doses at regular intervals. Do not take your medicine more often than directed. Do not stop taking except on the advice of your doctor or health care professional. A special MedGuide will be given to you by the pharmacist with each prescription and refill. Be sure to read this information carefully each time. Talk to your pediatrician regarding the use of this medicine in children. Special care may be needed. Overdosage: If you think you have taken too much of this medicine contact a poison control center or emergency room at once. NOTE: This medicine is only for you. Do not share this medicine with others. What if I miss a dose? If you miss a dose, take it as soon as you can. If it is almost time for your next dose, take only that dose. Do not take double or extra doses. What may interact with this medicine? -acetaminophen -cyclosporine -digoxin -medicines for blood pressure -medicines for diabetes, including insulin -medicines for hay fever and other allergies -medicines for depression, especially an Monoamine Oxidase Inhibitor (MAOI) -medicines for Parkinson's disease, like levodopa -medicines for sleep or for  pain -tetracycline This list may not describe all possible interactions. Give your health care provider a list of all the medicines, herbs, non-prescription drugs, or dietary supplements you use. Also tell them if you smoke, drink alcohol, or use illegal drugs. Some items may interact with your medicine. What should I watch for while using this medicine? It may take a few weeks for your stomach condition to start to get better. However, do not take this medicine for longer than 12 weeks. The longer you take this medicine, and the more you take it, the greater your chances are of developing serious side effects. If you are an elderly patient, a female patient, or you have diabetes, you may be at an increased risk for side effects from this medicine. Contact your doctor immediately if you start having movements you cannot control such as lip smacking, rapid movements of the tongue, involuntary or uncontrollable movements of the eyes, head, arms and legs, or muscle twitches and spasms. Patients and their families should watch out  for worsening depression or thoughts of suicide. Also watch out for any sudden or severe changes in feelings such as feeling anxious, agitated, panicky, irritable, hostile, aggressive, impulsive, severely restless, overly excited and hyperactive, or not being able to sleep. If this happens, especially at the beginning of treatment or after a change in dose, call your doctor. Do not treat yourself for high fever. Ask your doctor or health care professional for advice. You may get drowsy or dizzy. Do not drive, use machinery, or do anything that needs mental alertness until you know how this drug affects you. Do not stand or sit up quickly, especially if you are an older patient. This reduces the risk of dizzy or fainting spells. Alcohol can make you more drowsy and dizzy. Avoid alcoholic drinks. What side effects may I notice from receiving this medicine? Side effects that you should  report to your doctor or health care professional as soon as possible: -allergic reactions like skin rash, itching or hives, swelling of the face, lips, or tongue -abnormal production of milk in females -breast enlargement in both males and females -change in the way you walk -difficulty moving, speaking or swallowing -drooling, lip smacking, or rapid movements of the tongue -excessive sweating -fever -involuntary or uncontrollable movements of the eyes, head, arms and legs -irregular heartbeat or palpitations -muscle twitches and spasms -unusually weak or tired Side effects that usually do not require medical attention (report to your doctor or health care professional if they continue or are bothersome): -change in sex drive or performance -depressed mood -diarrhea -difficulty sleeping -headache -menstrual changes -restless or nervous This list may not describe all possible side effects. Call your doctor for medical advice about side effects. You may report side effects to FDA at 1-800-FDA-1088. Where should I keep my medicine? Keep out of the reach of children. Store at room temperature between 20 and 25 degrees C (68 and 77 degrees F). Protect from light. Keep container tightly closed. Throw away any unused medicine after the expiration date. NOTE: This sheet is a summary. It may not cover all possible information. If you have questions about this medicine, talk to your doctor, pharmacist, or health care provider.  2014, [employer] Standard. (2011-08-12 13:04:38) Prochlorperazine tablets What is this medicine? PROCHLORPERAZINE (proe klor PER a zeen) helps to control severe nausea and vomiting. This medicine is also used to treat schizophrenia. It can also help patients who experience anxiety that is not due to psychological illness. This medicine may be used for other purposes; ask your health care provider or pharmacist if you have questions. COMMON BRAND NAME(S):  Compazine What should I tell my health care provider before I take this medicine? They need to know if you have any of these conditions: -blood disorders or disease -dementia -liver disease or jaundice -Parkinson's disease -uncontrollable movement disorder -an unusual or allergic reaction to prochlorperazine, other medicines, foods, dyes, or preservatives -pregnant or trying to get pregnant -breast-feeding How should I use this medicine? Take this medicine by mouth with a glass of water. Follow the directions on the prescription label. Take your doses at regular intervals. Do not take your medicine more often than directed. Do not stop taking this medicine suddenly. This can cause nausea, vomiting, and dizziness. Ask your doctor or health care professional for advice. Talk to your pediatrician regarding the use of this medicine in children. Special care may be needed. While this drug may be prescribed for children as young as 2 years for selected conditions,  precautions do apply. Overdosage: If you think you have taken too much of this medicine contact a poison control center or emergency room at once. NOTE: This medicine is only for you. Do not share this medicine with others. What if I miss a dose? If you miss a dose, take it as soon as you can. If it is almost time for your next dose, take only that dose. Do not take double or extra doses. What may interact with this medicine? Do not take this medicine with any of the following medications: -amoxapine -antidepressants like citalopram, escitalopram, fluoxetine, paroxetine, and sertraline -deferoxamine -dofetilide -maprotiline -tricyclic antidepressants like amitriptyline, clomipramine, imipramine, nortiptyline and others This medicine may also interact with the following medications: -lithium -medicines for pain -phenytoin -propranolol -warfarin This list may not describe all possible interactions. Give your health care provider a  list of all the medicines, herbs, non-prescription drugs, or dietary supplements you use. Also tell them if you smoke, drink alcohol, or use illegal drugs. Some items may interact with your medicine. What should I watch for while using this medicine? Visit your doctor or health care professional for regular checks on your progress. You may get drowsy or dizzy. Do not drive, use machinery, or do anything that needs mental alertness until you know how this medicine affects you. Do not stand or sit up quickly, especially if you are an older patient. This reduces the risk of dizzy or fainting spells. Alcohol may interfere with the effect of this medicine. Avoid alcoholic drinks. This medicine can reduce the response of your body to heat or cold. Dress warm in cold weather and stay hydrated in hot weather. If possible, avoid extreme temperatures like saunas, hot tubs, very hot or cold showers, or activities that can cause dehydration such as vigorous exercise. This medicine can make you more sensitive to the sun. Keep out of the sun. If you cannot avoid being in the sun, wear protective clothing and use sunscreen. Do not use sun lamps or tanning beds/booths. Your mouth may get dry. Chewing sugarless gum or sucking hard candy, and drinking plenty of water may help. Contact your doctor if the problem does not go away or is severe. What side effects may I notice from receiving this medicine? Side effects that you should report to your doctor or health care professional as soon as possible: -blurred vision -breast enlargement in men or women -breast milk in women who are not breast-feeding -chest pain, fast or irregular heartbeat -confusion, restlessness -dark yellow or brown urine -difficulty breathing or swallowing -dizziness or fainting spells -drooling, shaking, movement difficulty (shuffling walk) or rigidity -fever, chills, sore throat -involuntary or uncontrollable movements of the eyes, mouth,  head, arms, and legs -seizures -stomach area pain -unusually weak or tired -unusual bleeding or bruising -yellowing of skin or eyes Side effects that usually do not require medical attention (report to your doctor or health care professional if they continue or are bothersome): -difficulty passing urine -difficulty sleeping -headache -sexual dysfunction -skin rash, or itching This list may not describe all possible side effects. Call your doctor for medical advice about side effects. You may report side effects to FDA at 1-800-FDA-1088. Where should I keep my medicine? Keep out of the reach of children. Store at room temperature between 15 and 30 degrees C (59 and 86 degrees F). Protect from light. Throw away any unused medicine after the expiration date. NOTE: This sheet is a summary. It may not cover all possible information. If  you have questions about this medicine, talk to your doctor, pharmacist, or health care provider.  2014, [employer] Standard. (2011-09-02 16:59:39) Lidocaine; Prilocaine cream What is this medicine? LIDOCAINE; PRILOCAINE (LYE doe kane; PRIL oh kane) is a topical anesthetic that causes loss of feeling in the skin and surrounding tissues. It is used to numb the skin before procedures or injections. This medicine may be used for other purposes; ask your health care provider or pharmacist if you have questions. COMMON BRAND NAME(S): EMLA What should I tell my health care provider before I take this medicine? They need to know if you have any of these conditions: -glucose-6-phosphate deficiencies -heart disease -kidney or liver disease -methemoglobinemia -an unusual or allergic reaction to lidocaine, prilocaine, other medicines, foods, dyes, or preservatives -pregnant or trying to get pregnant -breast-feeding How should I use this medicine? This medicine is for external use only on the skin. Do not take by mouth. Follow the directions on the prescription  label. Wash hands before and after use. Do not use more or leave in contact with the skin longer than directed. Do not apply to eyes or open wounds. It can cause irritation and blurred or temporary loss of vision. If this medicine comes in contact with your eyes, immediately rinse the eye with water. Do not touch or rub the eye. Contact your health care provider right away. Talk to your pediatrician regarding the use of this medicine in children. While this medicine may be prescribed for children for selected conditions, precautions do apply. Overdosage: If you think you have taken too much of this medicine contact a poison control center or emergency room at once. NOTE: This medicine is only for you. Do not share this medicine with others. What if I miss a dose? This medicine is usually only applied once prior to each procedure. It must be in contact with the skin for a period of time for it to work. If you applied this medicine later than directed, tell your health care professional before starting the procedure. What may interact with this medicine? -acetaminophen -chloroquine -dapsone -medicines to control heart rhythm -nitrates like nitroglycerin and nitroprusside -other ointments, creams, or sprays that may contain anesthetic medicine -phenobarbital -phenytoin -quinine -sulfonamides like sulfacetamide, sulfamethoxazole, sulfasalazine and others This list may not describe all possible interactions. Give your health care provider a list of all the medicines, herbs, non-prescription drugs, or dietary supplements you use. Also tell them if you smoke, drink alcohol, or use illegal drugs. Some items may interact with your medicine. What should I watch for while using this medicine? Be careful to avoid injury to the treated area while it is numb and you are not aware of pain. Avoid scratching, rubbing, or exposing the treated area to hot or cold temperatures until complete sensation has returned.  The numb feeling will wear off a few hours after applying the cream. What side effects may I notice from receiving this medicine? Side effects that you should report to your doctor or health care professional as soon as possible: -blurred vision -chest pain -difficulty breathing -dizziness -drowsiness -fast or irregular heartbeat -skin rash or itching -swelling of your throat, lips, or face -trembling Side effects that usually do not require medical attention (report to your doctor or health care professional if they continue or are bothersome): -changes in ability to feel hot or cold -redness and swelling at the application site This list may not describe all possible side effects. Call your doctor for medical advice about  side effects. You may report side effects to FDA at 1-800-FDA-1088. Where should I keep my medicine? Keep out of reach of children. Store at room temperature between 15 and 30 degrees C (59 and 86 degrees F). Keep container tightly closed. Throw away any unused medicine after the expiration date. NOTE: This sheet is a summary. It may not cover all possible information. If you have questions about this medicine, talk to your doctor, pharmacist, or health care provider.  2014, [employer] Standard. (2007-10-18 17:14:35)

## 2013-03-21 ENCOUNTER — Ambulatory Visit (HOSPITAL_COMMUNITY)
Admission: RE | Admit: 2013-03-21 | Discharge: 2013-03-21 | Disposition: A | Discharge status: Home / Self Care | Payer: Medicare Other | Source: Ambulatory Visit | Attending: Hematology and Oncology | Admitting: Hematology and Oncology

## 2013-03-21 ENCOUNTER — Encounter (HOSPITAL_COMMUNITY): Payer: Self-pay

## 2013-03-21 ENCOUNTER — Encounter (HOSPITAL_COMMUNITY): Admission: RE | Admit: 2013-03-21 | Payer: Medicare Other | Source: Ambulatory Visit

## 2013-03-21 ENCOUNTER — Encounter (HOSPITAL_COMMUNITY)
Admission: RE | Admit: 2013-03-21 | Discharge: 2013-03-21 | Disposition: A | Discharge status: Home / Self Care | Payer: Medicare Other | Source: Ambulatory Visit | Attending: Hematology and Oncology | Admitting: Hematology and Oncology

## 2013-03-21 ENCOUNTER — Ambulatory Visit (HOSPITAL_COMMUNITY): Payer: Medicare Other

## 2013-03-21 ENCOUNTER — Other Ambulatory Visit (HOSPITAL_COMMUNITY): Payer: Self-pay | Admitting: Hematology and Oncology

## 2013-03-21 DIAGNOSIS — C50919 Malignant neoplasm of unspecified site of unspecified female breast: Secondary | ICD-10-CM | POA: Insufficient documentation

## 2013-03-21 DIAGNOSIS — I4891 Unspecified atrial fibrillation: Secondary | ICD-10-CM

## 2013-03-21 DIAGNOSIS — C50912 Malignant neoplasm of unspecified site of left female breast: Secondary | ICD-10-CM

## 2013-03-21 MED ORDER — TECHNETIUM TC 99M-LABELED RED BLOOD CELLS IV KIT
25.0000 | PACK | Freq: Once | INTRAVENOUS | Status: AC | PRN
  Administered 2013-03-21: 27.4 via INTRAVENOUS

## 2013-03-21 MED ORDER — HEPARIN SOD (PORK) LOCK FLUSH 100 UNIT/ML IV SOLN
INTRAVENOUS | Status: AC
  Administered 2013-03-21: 50 [IU] via INTRAVENOUS
  Filled 2013-03-21: qty 5

## 2013-03-22 ENCOUNTER — Encounter (HOSPITAL_BASED_OUTPATIENT_CLINIC_OR_DEPARTMENT_OTHER): Payer: Medicare Other

## 2013-03-22 DIAGNOSIS — C50219 Malignant neoplasm of upper-inner quadrant of unspecified female breast: Secondary | ICD-10-CM

## 2013-03-22 DIAGNOSIS — C50212 Malignant neoplasm of upper-inner quadrant of left female breast: Secondary | ICD-10-CM

## 2013-03-22 NOTE — Progress Notes (Signed)
Chemo teaching done and consent signed for Taxol and Herceptin. Med calendar given to patient.

## 2013-03-31 ENCOUNTER — Ambulatory Visit (INDEPENDENT_AMBULATORY_CARE_PROVIDER_SITE_OTHER): Payer: Medicare Other | Admitting: *Deleted

## 2013-03-31 DIAGNOSIS — Z7901 Long term (current) use of anticoagulants: Secondary | ICD-10-CM

## 2013-03-31 LAB — POCT INR: INR: 2

## 2013-04-04 ENCOUNTER — Encounter (HOSPITAL_COMMUNITY): Payer: Self-pay

## 2013-04-05 ENCOUNTER — Encounter (HOSPITAL_COMMUNITY)
Admission: RE | Admit: 2013-04-05 | Discharge: 2013-04-05 | Disposition: A | Discharge status: Home / Self Care | Payer: Medicare Other | Source: Ambulatory Visit | Attending: General Surgery | Admitting: General Surgery

## 2013-04-05 ENCOUNTER — Encounter (HOSPITAL_COMMUNITY): Payer: Self-pay

## 2013-04-05 LAB — BASIC METABOLIC PANEL
BUN: 14 mg/dL (ref 6–23)
CO2: 27 mEq/L (ref 19–32)
Chloride: 98 mEq/L (ref 96–112)
Creatinine, Ser: 1.1 mg/dL (ref 0.50–1.10)

## 2013-04-05 LAB — CBC WITH DIFFERENTIAL/PLATELET
Basophils Absolute: 0.1 10*3/uL (ref 0.0–0.1)
HCT: 40.8 % (ref 36.0–46.0)
Hemoglobin: 13 g/dL (ref 12.0–15.0)
Lymphocytes Relative: 44 % (ref 12–46)
Monocytes Absolute: 0.7 10*3/uL (ref 0.1–1.0)
Monocytes Relative: 8 % (ref 3–12)
Neutro Abs: 3.8 10*3/uL (ref 1.7–7.7)
Platelets: 345 10*3/uL (ref 150–400)
RDW: 13.1 % (ref 11.5–15.5)
WBC: 8.6 10*3/uL (ref 4.0–10.5)

## 2013-04-05 NOTE — Patient Instructions (Signed)
Stacie Huang  04/05/2013   Your procedure is scheduled on:   04/07/2013  Report to [facility] at  1030  AM.  Call this number if you have problems the morning of surgery: [phone]   Remember:   Do not eat food or drink liquids after midnight.   Take these medicines the morning of surgery with A SIP OF WATER:  Atenolol, zyrtec, diltiazem, ativan, cozaar, omeprazole. Take flonase before you come.   Do not wear jewelry, make-up or nail polish.  Do not wear lotions, powders, or perfumes.   Do not shave 48 hours prior to surgery. Men may shave face and neck.  Do not bring valuables to the hospital.  [facility] is not responsible for any belongings or valuables.               Contacts, dentures or bridgework may not be worn into surgery.  Leave suitcase in the car. After surgery it may be brought to your room.  For patients admitted to the hospital, discharge time is determined by your treatment team.               Patients discharged the day of surgery will not be allowed to drive home.  Name and phone number of your driver: family  Special Instructions: Shower using CHG 2 nights before surgery and the night before surgery.  If you shower the day of surgery use CHG.  Use special wash - you have one bottle of CHG for all showers.  You should use approximately 1/3 of the bottle for each shower.   Please read over the following fact sheets that you were given: Pain Booklet, Coughing and Deep Breathing, Surgical Site Infection Prevention, Anesthesia Post-op Instructions and Care and Recovery After Surgery Implanted Port Instructions An implanted port is a central line that has a round shape and is placed under the skin. It is used for long-term IV (intravenous) access for:  Medicine.  Fluids.  Liquid nutrition, such as TPN (total parenteral nutrition).  Blood samples. Ports can be placed:  In the chest area just below the collarbone (this is the most common place.)  In the  arms.  In the belly (abdomen) area.  In the legs. PARTS OF THE PORT A port has 2 main parts:  The reservoir. The reservoir is round, disc-shaped, and will be a small, raised area under your skin.  The reservoir is the part where a needle is inserted (accessed) to either give medicines or to draw blood.  The catheter. The catheter is a long, slender tube that extends from the reservoir. The catheter is placed into a large vein.  Medicine that is inserted into the reservoir goes into the catheter and then into the vein. INSERTION OF THE PORT  The port is surgically placed in either an operating room or in a procedural area (interventional radiology).  Medicine may be given to help you relax during the procedure.  The skin where the port will be inserted is numbed (local anesthetic).  1 or 2 small cuts (incisions) will be made in the skin to insert the port.  The port can be used after it has been inserted. INCISION SITE CARE  The incision site may have small adhesive strips on it. This helps keep the incision site closed. Sometimes, no adhesive strips are placed. Instead of adhesive strips, a special kind of surgical glue is used to keep the incision closed.  If adhesive strips were  placed on the incision sites, do not take them off. They will fall off on their own.  The incision site may be sore for 1 to 2 days. Pain medicine can help.  Do not get the incision site wet. Bathe or shower as directed by your caregiver.  The incision site should heal in 5 to 7 days. A small scar may form after the incision has healed. ACCESSING THE PORT Special steps must be taken to access the port:  Before the port is accessed, a numbing cream can be placed on the skin. This helps numb the skin over the port site.  A sterile technique is used to access the port.  The port is accessed with a needle. Only "non-coring" port needles should be used to access the port. Once the port is accessed, a  blood return should be checked. This helps ensure the port is in the vein and is not clogged (clotted).  If your caregiver believes your port should remain accessed, a clear (transparent) bandage will be placed over the needle site. The bandage and needle will need to be changed every week or as directed by your caregiver.  Keep the bandage covering the needle clean and dry. Do not get it wet. Follow your caregiver's instructions on how to take a shower or bath when the port is accessed.  If your port does not need to stay accessed, no bandage is needed over the port. FLUSHING THE PORT Flushing the port keeps it from getting clogged. How often the port is flushed depends on:  If a constant infusion is running. If a constant infusion is running, the port may not need to be flushed.  If intermittent medicines are given.  If the port is not being used. For intermittent medicines:  The port will need to be flushed:  After medicines have been given.  After blood has been drawn.  As part of routine maintenance.  A port is normally flushed with:  Normal saline.  Heparin.  Follow your caregiver's advice on how often, how much, and the type of flush to use on your port. IMPORTANT PORT INFORMATION  Tell your caregiver if you are allergic to heparin.  After your port is placed, you will get a manufacturer's information card. The card has information about your port. Keep this card with you at all times.  There are many types of ports available. Know what kind of port you have.  In case of an emergency, it may be helpful to wear a medical alert bracelet. This can help alert health care workers that you have a port.  The port can stay in for as long as your caregiver believes it is necessary.  When it is time for the port to come out, surgery will be done to remove it. The surgery will be similar to how the port was put in.  If you are in the hospital or clinic:  Your port will be  taken care of and flushed by a nurse.  If you are at home:  A home health care nurse may give medicines and take care of the port.  You or a family member can get special training and directions for giving medicine and taking care of the port at home. SEEK IMMEDIATE MEDICAL CARE IF:   Your port does not flush or you are unable to get a blood return.  New drainage or pus is coming from the incision.  A bad smell is coming from the incision  site.  You develop swelling or increased redness at the incision site.  You develop increased swelling or pain at the port site.  You develop swelling or pain in the surrounding skin near the port.  You have an oral temperature above 102 F (38.9 C), not controlled by medicine. MAKE SURE YOU:   Understand these instructions.  Will watch your condition.  Will get help right away if you are not doing well or get worse. Document Released: 04/14/2005 Document Revised: 07/07/2011 Document Reviewed: 07/06/2008 [facility] Patient Information 2014 [place], [place]. PATIENT INSTRUCTIONS POST-ANESTHESIA  IMMEDIATELY FOLLOWING SURGERY:  Do not drive or operate machinery for the first twenty four hours after surgery.  Do not make any important decisions for twenty four hours after surgery or while taking narcotic pain medications or sedatives.  If you develop intractable nausea and vomiting or a severe headache please notify your doctor immediately.  FOLLOW-UP:  Please make an appointment with your surgeon as instructed. You do not need to follow up with anesthesia unless specifically instructed to do so.  WOUND CARE INSTRUCTIONS (if applicable):  Keep a dry clean dressing on the anesthesia/puncture wound site if there is drainage.  Once the wound has quit draining you may leave it open to air.  Generally you should leave the bandage intact for twenty four hours unless there is drainage.  If the epidural site drains for more than 36-48 hours please call the  anesthesia department.  QUESTIONS?:  Please feel free to call your physician or the hospital operator if you have any questions, and they will be happy to assist you.

## 2013-04-07 ENCOUNTER — Ambulatory Visit (HOSPITAL_COMMUNITY)
Admission: RE | Admit: 2013-04-07 | Discharge: 2013-04-07 | Disposition: A | Discharge status: Home / Self Care | Payer: Medicare Other | Source: Ambulatory Visit | Attending: General Surgery | Admitting: General Surgery

## 2013-04-07 ENCOUNTER — Inpatient Hospital Stay (HOSPITAL_COMMUNITY): Payer: Medicare Other

## 2013-04-07 ENCOUNTER — Ambulatory Visit (HOSPITAL_COMMUNITY): Payer: Medicare Other

## 2013-04-07 ENCOUNTER — Encounter (HOSPITAL_COMMUNITY): Payer: Self-pay | Admitting: *Deleted

## 2013-04-07 ENCOUNTER — Encounter (HOSPITAL_COMMUNITY)
Admission: RE | Disposition: A | Discharge status: Home / Self Care | Payer: Self-pay | Source: Ambulatory Visit | Attending: General Surgery

## 2013-04-07 ENCOUNTER — Encounter (HOSPITAL_COMMUNITY): Payer: Medicare Other | Admitting: Anesthesiology

## 2013-04-07 ENCOUNTER — Ambulatory Visit (HOSPITAL_COMMUNITY): Payer: Medicare Other | Admitting: Anesthesiology

## 2013-04-07 DIAGNOSIS — E559 Vitamin D deficiency, unspecified: Secondary | ICD-10-CM

## 2013-04-07 DIAGNOSIS — E785 Hyperlipidemia, unspecified: Secondary | ICD-10-CM

## 2013-04-07 DIAGNOSIS — C50212 Malignant neoplasm of upper-inner quadrant of left female breast: Secondary | ICD-10-CM

## 2013-04-07 DIAGNOSIS — Z7901 Long term (current) use of anticoagulants: Secondary | ICD-10-CM | POA: Insufficient documentation

## 2013-04-07 DIAGNOSIS — I480 Paroxysmal atrial fibrillation: Secondary | ICD-10-CM

## 2013-04-07 DIAGNOSIS — C50919 Malignant neoplasm of unspecified site of unspecified female breast: Secondary | ICD-10-CM | POA: Insufficient documentation

## 2013-04-07 DIAGNOSIS — C50912 Malignant neoplasm of unspecified site of left female breast: Secondary | ICD-10-CM

## 2013-04-07 DIAGNOSIS — I4891 Unspecified atrial fibrillation: Secondary | ICD-10-CM

## 2013-04-07 DIAGNOSIS — I10 Essential (primary) hypertension: Secondary | ICD-10-CM | POA: Insufficient documentation

## 2013-04-07 DIAGNOSIS — C801 Malignant (primary) neoplasm, unspecified: Secondary | ICD-10-CM

## 2013-04-07 HISTORY — PX: PORTACATH PLACEMENT: SHX2246

## 2013-04-07 LAB — PROTIME-INR: INR: 1.07 (ref 0.00–1.49)

## 2013-04-07 SURGERY — INSERTION, TUNNELED CENTRAL VENOUS DEVICE, WITH PORT
Anesthesia: Monitor Anesthesia Care | Site: Chest | Laterality: Right

## 2013-04-07 MED ORDER — LIDOCAINE HCL (PF) 1 % IJ SOLN
INTRAMUSCULAR | Status: AC
  Filled 2013-04-07: qty 30

## 2013-04-07 MED ORDER — STERILE WATER FOR IRRIGATION IR SOLN
Status: DC | PRN
  Administered 2013-04-07 (×2): 1000 mL

## 2013-04-07 MED ORDER — FENTANYL CITRATE 0.05 MG/ML IJ SOLN
25.0000 ug | INTRAMUSCULAR | Status: AC
  Administered 2013-04-07 (×2): 25 ug via INTRAVENOUS

## 2013-04-07 MED ORDER — BACITRACIN ZINC 500 UNIT/GM EX OINT
TOPICAL_OINTMENT | CUTANEOUS | Status: AC
  Filled 2013-04-07: qty 0.9

## 2013-04-07 MED ORDER — HEPARIN SOD (PORK) LOCK FLUSH 100 UNIT/ML IV SOLN
INTRAVENOUS | Status: AC
  Filled 2013-04-07: qty 5

## 2013-04-07 MED ORDER — PROPOFOL INFUSION 10 MG/ML OPTIME
INTRAVENOUS | Status: DC | PRN
  Administered 2013-04-07: 120 ug/kg/min via INTRAVENOUS

## 2013-04-07 MED ORDER — BACITRACIN-NEOMYCIN-POLYMYXIN 400-5-5000 EX OINT
TOPICAL_OINTMENT | CUTANEOUS | Status: DC | PRN
  Administered 2013-04-07: 1 via TOPICAL

## 2013-04-07 MED ORDER — LACTATED RINGERS IV SOLN
INTRAVENOUS | Status: DC
  Administered 2013-04-07: 1000 mL via INTRAVENOUS

## 2013-04-07 MED ORDER — HEPARIN SODIUM (PORCINE) 1000 UNIT/ML IJ SOLN
INTRAMUSCULAR | Status: AC
  Filled 2013-04-07: qty 1

## 2013-04-07 MED ORDER — HEPARIN SOD (PORK) LOCK FLUSH 100 UNIT/ML IV SOLN
INTRAVENOUS | Status: DC | PRN
  Administered 2013-04-07: 500 [IU] via INTRAVENOUS

## 2013-04-07 MED ORDER — MIDAZOLAM HCL 2 MG/2ML IJ SOLN
INTRAMUSCULAR | Status: AC
  Filled 2013-04-07: qty 2

## 2013-04-07 MED ORDER — CEFAZOLIN SODIUM-DEXTROSE 2-3 GM-% IV SOLR
2.0000 g | Freq: Once | INTRAVENOUS | Status: AC
  Administered 2013-04-07: 2 g via INTRAVENOUS

## 2013-04-07 MED ORDER — PROPOFOL 10 MG/ML IV BOLUS
INTRAVENOUS | Status: AC
  Filled 2013-04-07: qty 20

## 2013-04-07 MED ORDER — LACTATED RINGERS IV SOLN
INTRAVENOUS | Status: DC | PRN
  Administered 2013-04-07: 11:00:00 via INTRAVENOUS

## 2013-04-07 MED ORDER — CEFAZOLIN SODIUM 1-5 GM-% IV SOLN
INTRAVENOUS | Status: AC
  Filled 2013-04-07: qty 100

## 2013-04-07 MED ORDER — ONDANSETRON HCL 4 MG/2ML IJ SOLN: 4.0000 mg | Freq: Once | INTRAMUSCULAR | Status: DC | PRN

## 2013-04-07 MED ORDER — FENTANYL CITRATE 0.05 MG/ML IJ SOLN
INTRAMUSCULAR | Status: AC
  Filled 2013-04-07: qty 2

## 2013-04-07 MED ORDER — SODIUM CHLORIDE 0.9 % IV SOLN
INTRAVENOUS | Status: DC | PRN
  Administered 2013-04-07: 500 mL via INTRAMUSCULAR

## 2013-04-07 MED ORDER — ONDANSETRON HCL 4 MG/2ML IJ SOLN
INTRAMUSCULAR | Status: AC
  Filled 2013-04-07: qty 2

## 2013-04-07 MED ORDER — ONDANSETRON HCL 4 MG/2ML IJ SOLN
4.0000 mg | Freq: Once | INTRAMUSCULAR | Status: AC
  Administered 2013-04-07: 4 mg via INTRAVENOUS

## 2013-04-07 MED ORDER — MIDAZOLAM HCL 2 MG/2ML IJ SOLN
1.0000 mg | INTRAMUSCULAR | Status: DC | PRN
  Administered 2013-04-07: 2 mg via INTRAVENOUS

## 2013-04-07 MED ORDER — FENTANYL CITRATE 0.05 MG/ML IJ SOLN: 25.0000 ug | INTRAMUSCULAR | Status: DC | PRN

## 2013-04-07 MED ORDER — LIDOCAINE HCL (PF) 1 % IJ SOLN
INTRAMUSCULAR | Status: DC | PRN
  Administered 2013-04-07: 7 mL

## 2013-04-07 SURGICAL SUPPLY — 45 items
BAG DECANTER FOR FLEXI CONT (MISCELLANEOUS) ×2 IMPLANT
BAG HAMPER (MISCELLANEOUS) ×2 IMPLANT
BLADE SURG 15 STRL LF DISP TIS (BLADE) IMPLANT
BLADE SURG 15 STRL SS (BLADE) ×4
CLOTH BEACON ORANGE TIMEOUT ST (SAFETY) ×2 IMPLANT
COVER LIGHT HANDLE STERIS (MISCELLANEOUS) ×4 IMPLANT
DECANTER SPIKE VIAL GLASS SM (MISCELLANEOUS) ×2 IMPLANT
DRAPE C-ARM FOLDED MOBILE STRL (DRAPES) ×2 IMPLANT
DRSG TEGADERM 2-3/8X2-3/4 SM (GAUZE/BANDAGES/DRESSINGS) ×2 IMPLANT
DURAPREP 26ML APPLICATOR (WOUND CARE) ×2 IMPLANT
ELECT REM PT RETURN 9FT ADLT (ELECTROSURGICAL) ×2
ELECTRODE REM PT RTRN 9FT ADLT (ELECTROSURGICAL) ×1 IMPLANT
GLOVE BIOGEL PI IND STRL 7.0 (GLOVE) IMPLANT
GLOVE BIOGEL PI IND STRL 7.5 (GLOVE) IMPLANT
GLOVE BIOGEL PI INDICATOR 7.0 (GLOVE) ×1
GLOVE BIOGEL PI INDICATOR 7.5 (GLOVE) ×1
GLOVE ECLIPSE 6.5 STRL STRAW (GLOVE) ×1 IMPLANT
GLOVE EXAM NITRILE LRG STRL (GLOVE) ×1 IMPLANT
GLOVE SKINSENSE NS SZ7.0 (GLOVE) ×1
GLOVE SKINSENSE STRL SZ7.0 (GLOVE) ×1 IMPLANT
GOWN STRL REIN XL XLG (GOWN DISPOSABLE) ×5 IMPLANT
IV NS 500ML (IV SOLUTION) ×2
IV NS 500ML BAXH (IV SOLUTION) ×1 IMPLANT
KIT PORT POWER 8FR ISP MRI (CATHETERS) ×2 IMPLANT
KIT ROOM TURNOVER APOR (KITS) ×2 IMPLANT
MANIFOLD NEPTUNE II (INSTRUMENTS) ×2 IMPLANT
NDL HYPO 18GX1.5 BLUNT FILL (NEEDLE) ×1 IMPLANT
NDL HYPO 25X1 1.5 SAFETY (NEEDLE) ×1 IMPLANT
NEEDLE HYPO 18GX1.5 BLUNT FILL (NEEDLE) ×2 IMPLANT
NEEDLE HYPO 25X1 1.5 SAFETY (NEEDLE) ×2 IMPLANT
PACK MINOR (CUSTOM PROCEDURE TRAY) ×2 IMPLANT
PAD ARMBOARD 7.5X6 YLW CONV (MISCELLANEOUS) ×2 IMPLANT
SET BASIN LINEN APH (SET/KITS/TRAYS/PACK) ×2 IMPLANT
SPONGE GAUZE 2X2 8PLY STRL LF (GAUZE/BANDAGES/DRESSINGS) ×2 IMPLANT
STRIP CLOSURE SKIN 1/4X3 (GAUZE/BANDAGES/DRESSINGS) ×2 IMPLANT
SUT VIC AB 4-0 SH 27 (SUTURE) ×2
SUT VIC AB 4-0 SH 27XBRD (SUTURE) ×1 IMPLANT
SUT VIC AB 5-0 P-3 18X BRD (SUTURE) ×1 IMPLANT
SUT VIC AB 5-0 P3 18 (SUTURE) ×2
SYR 20CC LL (SYRINGE) ×2 IMPLANT
SYR 5ML LL (SYRINGE) ×4 IMPLANT
SYR BULB IRRIGATION 50ML (SYRINGE) ×2 IMPLANT
SYR CONTROL 10ML LL (SYRINGE) ×2 IMPLANT
TOWEL OR 17X26 4PK STRL BLUE (TOWEL DISPOSABLE) ×2 IMPLANT
WATER STERILE IRR 1000ML POUR (IV SOLUTION) ×4 IMPLANT

## 2013-04-07 NOTE — Anesthesia Postprocedure Evaluation (Signed)
  Anesthesia Post-op Note  Patient: Eulanda A Scaff  Procedure(s) Performed: Procedure(s): INSERTION PORT-A-CATH (Right)  Patient Location: PACU  Anesthesia Type:MAC  Level of Consciousness: awake, alert , oriented and patient cooperative  Airway and Oxygen Therapy: Patient Spontanous Breathing  Post-op Pain: none  Post-op Assessment: Post-op Vital signs reviewed, Patient's Cardiovascular Status Stable, Respiratory Function Stable, Patent Airway, No signs of Nausea or vomiting and Pain level controlled  Post-op Vital Signs: Reviewed and stable  Complications: No apparent anesthesia complications 

## 2013-04-07 NOTE — Anesthesia Preprocedure Evaluation (Signed)
Anesthesia Evaluation  Patient identified by MRN, date of birth, ID band Patient awake    Reviewed: Allergy & Precautions, H&P , NPO status , Patient's Chart, lab work & pertinent test results  Airway Mallampati: II  Neck ROM: Full    Dental  (+) Partial Lower and Partial Upper   Pulmonary  breath sounds clear to auscultation        Cardiovascular hypertension, Pt. on medications + dysrhythmias Atrial Fibrillation Rhythm:Irregular Rate:Normal     Neuro/Psych    GI/Hepatic GERD-  Medicated and Controlled,  Endo/Other    Renal/GU      Musculoskeletal   Abdominal   Peds  Hematology   Anesthesia Other Findings   Reproductive/Obstetrics                           Anesthesia Physical Anesthesia Plan  ASA: III  Anesthesia Plan: MAC   Post-op Pain Management:    Induction: Intravenous  Airway Management Planned: Simple Face Mask  Additional Equipment:   Intra-op Plan:   Post-operative Plan:   Informed Consent: I have reviewed the patients History and Physical, chart, labs and discussed the procedure including the risks, benefits and alternatives for the proposed anesthesia with the patient or authorized representative who has indicated his/her understanding and acceptance.     Plan Discussed with:   Anesthesia Plan Comments:         Anesthesia Quick Evaluation

## 2013-04-07 NOTE — Progress Notes (Signed)
Stacie Obey, MD 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  INTERVAL HISTORY: Stacie Huang 72 y.o. 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 Malvin Johns, sentinel node biopsy, 0.7 cm, ER positive, PR positive, HER-2/neu overexpressed.  Stacie is S/P port placement by Dr. Malvin Johns on 04/07/2013 and she is here today to embark on chemotherapy.  She has completed chemotherapy teaching.  She will be receiving Paclitaxel in a weekly fashion x 12 with Herceptin weekly (followed by Herceptin every 3 weeks x 52 weeks in total).  Following cytotoxic chemotherapy, she will start an AI for her ER/PR + cancer.   She will start chemotherapy today as scheduled. She is receiving Pepcid presently and has already received Benadryl in preparation for chemotherapy.  She will get Zofran and Dexamethasone as premedications.  Depending on her tolerance to cycle 1 and her nausea/vomiting side effect, I would be inclined to switch her pre-meds to Aloxi and Emend, + Dexamethasone depending on tolerance to chemotherapy.   She denies any complaints this AM, but she appears anxious.  She complains of nausea now, but she has not yet received antiemetics or chemotherapy at this time yet.  I have asked the nurse to start pre-meds when they are available.   Her port is intact without erythema.  She reports that it is tender.   Past Medical History  Diagnosis Date  . Hypertension   . Paroxysmal atrial fibrillation   . Chronic anticoagulation   . Hyperlipidemia   . GERD (gastroesophageal reflux disease)   . Cancer   . Breast cancer     has Hyperlipidemia; Chronic anticoagulation; Atrial fibrillation; Hypertension; Vitamin D deficiency; Breast  cancer of upper-inner quadrant of left female breast; and Paroxysmal atrial fibrillation on her problem list.     is allergic to iohexol and sulfa antibiotics.  Ms. Huang does not currently have medications on file.  Past Surgical History  Procedure Laterality Date  . Abdominal hysterectomy    . Cholecystectomy    . Partial mastectomy with axillary sentinel lymph node biopsy Left 02/23/2013    Procedure: LEFT PARTIAL MASTECTOMY WITH AXILLARY SIMPLE NODE BIOPSY, LEFT BREAST WIDE EXCISION;  Surgeon: Marlane Hatcher, MD;  Location: [facility];  Service: General;  Laterality: Left;  . Axillary lymph node dissection Left 02/23/2013    Procedure: LEFT AXILLARY LYMPH NODE DISSECTION;  Surgeon: Marlane Hatcher, MD;  Location: [facility];  Service: General;  Laterality: Left;  . Portacath placement Right 04/07/2013    Denies any headaches, dizziness, double vision, fevers, chills, night sweats, nausea, vomiting, diarrhea, constipation, chest pain, heart palpitations, shortness of breath, blood in stool, black tarry stool, urinary pain, urinary burning, urinary frequency, hematuria.   PHYSICAL EXAMINATION  ECOG PERFORMANCE STATUS: 1 - Symptomatic but completely ambulatory  There were no vitals filed for this visit.  GENERAL:alert, no distress, well nourished, well developed, comfortable, cooperative and obese, in chemo-chair recliner SKIN: skin color, texture, turgor are normal, no rashes or significant lesions HEAD: Normocephalic, No masses, lesions, tenderness or abnormalities EYES: normal, PERRLA, EOMI, Conjunctiva are pink and non-injected EARS: External ears normal OROPHARYNX:mucous membranes are moist  NECK: supple, trachea midline LYMPH:  not examined BREAST:not examined LUNGS: clear to auscultation  HEART: regular rate & rhythm, no murmurs and no gallops ABDOMEN:abdomen soft, non-tender, obese and normal bowel sounds BACK: Back symmetric, no curvature. EXTREMITIES:less then 2 second  capillary refill, no joint deformities, effusion, or inflammation, no skin discoloration, no cyanosis  NEURO: alert & oriented x 3 with fluent speech, no focal motor/sensory deficits   LABORATORY DATA: CBC    Component Value Date/Time   WBC 8.6 04/05/2013 0908   RBC 4.28 04/05/2013 0908   HGB 13.0 04/05/2013 0908   HCT 40.8 04/05/2013 0908   PLT 345 04/05/2013 0908   MCV 95.3 04/05/2013 0908   MCH 30.4 04/05/2013 0908   MCHC 31.9 04/05/2013 0908   RDW 13.1 04/05/2013 0908   LYMPHSABS 3.8 04/05/2013 0908   MONOABS 0.7 04/05/2013 0908   EOSABS 0.3 04/05/2013 0908   BASOSABS 0.1 04/05/2013 0908      Chemistry      Component Value Date/Time   NA 135 04/05/2013 0908   K 4.7 04/05/2013 0908   CL 98 04/05/2013 0908   CO2 27 04/05/2013 0908   BUN 14 04/05/2013 0908   CREATININE 1.10 04/05/2013 0908      Component Value Date/Time   CALCIUM 9.5 04/05/2013 0908   ALKPHOS 67 03/16/2013 1556   AST 23 03/16/2013 1556   ALT 15 03/16/2013 1556   BILITOT 0.3 03/16/2013 1556       RADIOGRAPHIC STUDIES:  Dg Chest Portable 1 View  04/07/2013   CLINICAL DATA:  Port placement.  EXAM: PORTABLE CHEST - 1 VIEW  COMPARISON:  06/07/2012  FINDINGS: There has been interval placement of a right subclavian Port-A-Cath with tip projecting over the upper SVC. The cardiac silhouette is upper limits of normal in size. Lung volumes are mildly diminished compared to the prior exam with mild crowding of the pulmonary vasculature and mild bibasilar opacity. No pneumothorax or pleural effusion is identified. Surgical clips are noted in the left axilla. Multilevel degenerative osteophytosis is noted in the thoracic spine.  IMPRESSION: 1. Right subclavian Port-A-Cath placement as above. No pneumothorax identified. 2. Hypoinflation with mild bibasilar atelectasis.   Electronically Signed   By: Sebastian Ache   On: 04/07/2013 14:10   Dg C-arm 1-60 Min-no Report  04/07/2013   CLINICAL DATA: port-a-cath insertion   C-ARM 1-60  MINUTES  Fluoroscopy was utilized by the requesting physician.  No radiographic  interpretation.     03/21/2013  CLINICAL DATA: Breast cancer, baseline assessment prior to  Herceptin therapy, history atrial fibrillation  EXAM:  NUCLEAR MEDICINE CARDIAC BLOOD POOL IMAGING (MUGA)  TECHNIQUE:  Cardiac multi-gated acquisition was performed at rest following  intravenous injection of Tc-37m labeled red blood cells. Patient a  rhythmic during imaging  COMPARISON: None  Correlation: Gated SPECT myocardial perfusion imaging 11/08/2009  RADIOPHARMACEUTICALS: 27.4MILLI CURIE ULTRATAG TECHNETIUM TC  27M-LABELED RED BLOOD CELLS IV KITmCiTc-52m in-vitro labeled red  blood cells.  FINDINGS:  Calculated left ventricular ejection fraction is 63%, normal.  Calculated LVEF on prior gated myocardial perfusion SPECT imaging  with 74%.  Wall motion analysis in 3 projections demonstrates normal left  ventricular wall motion.  Examination was obtained at a cardiac rate of 77 beats per min  average.  IMPRESSION:  Normal left ventricular ejection fraction of 63%.  Normal LV wall motion.  Electronically Signed  By: Ulyses Southward M.D.  On: 03/21/2013 17:07    PATHOLOGY:  02/23/2013  Diagnosis 1. Breast, lumpectomy, left - BENIGN BREAST PARENCHYMA. - HEALING BIOPSY SITE. - SEE ONCOLOGY TABLE BELOW. 2.  Soft tissue, biopsy, ? left axillary sentinel node - BENIGN FIBROADIPOSE TISSUE. - LYMPH NODAL TISSUE IS NOT IDENTIFIED. 3. Lymph node, sentinel, biopsy, left axillary - THERE IS NO EVIDENCE OF CARCINOMA IN 1 OF 1 LYMPH NODE (0/1). 4. Fatty tissue, left axillary contents - BENIGN BREAST PARENCHYMA WITH FIBROCYSTIC CHANGES. - THERE IS NO EVIDENCE OF MALIGNANCY. Microscopic Comment 1. BREAST, INVASIVE TUMOR, WITH LYMPH NODE SAMPLING (BASED ON FINDINGS FROM PREVIOUS BIOPSY [phone]) Specimen, including laterality and lymph node sampling (sentinel, non-sentinel): Left breast and  sentinel lymph node Procedure: Core biopsy and subsequent lumpectomy and sentinel lymph node resection Histologic type: Ductal Grade: III Tubule formation: 3 Nuclear pleomorphism: 3 Mitotic: 2 Tumor size (gross measurement or glass slide measurement): 0.7 cm Margins: Invasive, distance to closest margin: Greater than 0.2 cm to all margins Lymphovascular invasion: Not identified Ductal carcinoma in situ: Not identified Lobular neoplasia: Not identified Tumor focality: Unifocal Treatment effect: N/A Extent of tumor: Confined to breast parenchyma 1 of 3 FINAL for Kempker, Gwendolyn A ([id number]) Microscopic Comment(continued) Lymph nodes: Examined: 1 Sentinel 0 Non-sentinel 1 Total Breast prognostic profile: [phone] Estrogen receptor: 99% strong staining intensity Progesterone receptor: 13% strong staining intensity Her 2 neu: Amplification was detected. The ratio was 5.03 Ki-67: 32% Non-neoplastic breast: Healing biopsy site and fibroadenoma TNM: pT1b, pN0 Comments: There are two located areas present in the current lumpectomy specimen. Histologic evaluation of multiple tissue sections from these areas reveals changes consistent with a previous biopsy as well as a fibroadenoma. Malignancy is not identified in the current specimen. Pecola Leisure MD Pathologist, Electronic Signature (Case signed 02/25/2013)     ASSESSMENT:  1. 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 Malvin Johns, sentinel node biopsy, 0.7 cm, ER positive, PR positive, HER-2/neu overexpressed.  Patient Active Problem List   Diagnosis Date Noted  . Paroxysmal atrial fibrillation 03/17/2013  . Vitamin D deficiency 03/16/2013  . Breast cancer of upper-inner quadrant of left female breast 03/16/2013  . Atrial fibrillation   . Hypertension   . Chronic anticoagulation 11/11/2010  . Hyperlipidemia 02/26/2010     PLAN:  1. I  personally reviewed and went over laboratory results with the patient. 2. I personally reviewed and went over radiographic studies with the patient. 3. I personally reviewed and went over pathology results with the patient. 4. Will adjust anti-emetic regimen if necessary 5. Return in 2-3 weeks for follow-up and to evaluate tolerance to chemotherapy.     THERAPY PLAN:  The plan is for [facility] to receive weekly Paclitaxel x 12 cycles with weekly Herceptin for her Her2 + breast cancer.  Following completion of 12 treatments, Herceptin will be transitioned to every 3 week regimen to complete 52 weeks work of Herceptin therapy.  She will then be started on an AI after the completion of Cytotoxic chemotherapy, the day of her first 3 week Herceptin dose.   All questions were answered. The patient knows to call the clinic with any problems, questions or concerns. We can certainly see the patient much sooner if necessary.  Patient and plan discussed with Dr. Alla German and he is in agreement with the aforementioned.   KEFALAS,THOMAS

## 2013-04-07 NOTE — Anesthesia Procedure Notes (Signed)
Procedure Name: MAC Performed by: ADAMS, AMY A Pre-anesthesia Checklist: Patient identified, Timeout performed, Emergency Drugs available, Suction available and Patient being monitored Oxygen Delivery Method: Non-rebreather mask     

## 2013-04-07 NOTE — Op Note (Signed)
04/07/2013  1:40 PM  PATIENT:  Stacie Huang  72 y.o. female  PRE-OPERATIVE DIAGNOSIS:  left breast cancer  POST-OPERATIVE DIAGNOSIS:  left breast cancer  PROCEDURE:  Procedure(s): INSERTION PORT-A-CATH (Right)  SURGEON:  Surgeon(s) and Role:    * Marlane Hatcher, MD - Primary  PHYSICIAN ASSISTANT:   ASSISTANTS: none   ANESTHESIA:   IV sedation  EBL:  Total I/O In: -  Out: 5 [Blood:5]  BLOOD ADMINISTERED:none  DRAINS: none   LOCAL MEDICATIONS USED:  XYLOCAINE 1% without epi ~ 10 cc.  SPECIMEN:  No Specimen  DISPOSITION OF SPECIMEN:  N/A  COUNTS:  YES  TOURNIQUET:  * No tourniquets in log *  DICTATION: .Other Dictation: Dictation Number OR dict. # [id number].  PLAN OF CARE: Discharge to home after PACU  PATIENT DISPOSITION:  PACU - hemodynamically stable.   Delay start of Pharmacological VTE agent (>24hrs) due to surgical blood loss or risk of bleeding: not applicable

## 2013-04-07 NOTE — Progress Notes (Signed)
`  46 yr. Old W. Female for insertion of porta cath for further treatment of invasive CA of the Left breast.  Procedure and risks explained and informed consent obtained.  Labs reviewed; pt and INR ok.  She will resume coumadin for atrial fib post op as per cardiology instructions.  No clinical change in H&P, dict.# [id number].  Filed Vitals:   04/07/13 1150  BP: 132/67  Pulse:   Temp:   Resp: 19  pulse 85/min., temp 97.9, O2 sat 93% RA.

## 2013-04-07 NOTE — Progress Notes (Signed)
Post OP check  Filed Vitals:   04/07/13 1400  BP: 115/54  Pulse: 97  Temp:   Resp: 20  Doing well post op.  CXR shows cath tip in mid SVC.  No pneumothorax.  Dressing dry and in tact.  Pt. Will resume coumadin in AM  F/U and discharge arranged.

## 2013-04-07 NOTE — Preoperative (Signed)
Beta Blockers   Reason not to administer Beta Blockers:Not Applicable 

## 2013-04-07 NOTE — Anesthesia Postprocedure Evaluation (Signed)
  Anesthesia Post-op Note  Patient: Stacie Huang  Procedure(s) Performed: Procedure(s): INSERTION PORT-A-CATH (Right)  Patient Location: PACU  Anesthesia Type:MAC  Level of Consciousness: awake, alert , oriented and patient cooperative  Airway and Oxygen Therapy: Patient Spontanous Breathing  Post-op Pain: none  Post-op Assessment: Post-op Vital signs reviewed, Patient's Cardiovascular Status Stable, Respiratory Function Stable, Patent Airway, No signs of Nausea or vomiting and Pain level controlled  Post-op Vital Signs: Reviewed and stable  Complications: No apparent anesthesia complications

## 2013-04-07 NOTE — Transfer of Care (Signed)
Immediate Anesthesia Transfer of Care Note  Patient: Stacie Huang  Procedure(s) Performed: Procedure(s): INSERTION PORT-A-CATH (Right)  Patient Location: PACU  Anesthesia Type:MAC  Level of Consciousness: awake, alert , oriented and patient cooperative  Airway & Oxygen Therapy: Patient Spontanous Breathing  Post-op Assessment: Report given to PACU RN and Post -op Vital signs reviewed and stable  Post vital signs: Reviewed and stable  Complications: No apparent anesthesia complications

## 2013-04-08 ENCOUNTER — Encounter (HOSPITAL_COMMUNITY): Payer: Medicare Other | Attending: Hematology and Oncology

## 2013-04-08 ENCOUNTER — Encounter (HOSPITAL_COMMUNITY): Payer: Self-pay | Admitting: Oncology

## 2013-04-08 ENCOUNTER — Encounter (HOSPITAL_BASED_OUTPATIENT_CLINIC_OR_DEPARTMENT_OTHER): Payer: Medicare Other | Admitting: Oncology

## 2013-04-08 VITALS — BP 111/61 | HR 78 | Temp 98.2°F | Resp 16 | Wt 155.8 lb

## 2013-04-08 DIAGNOSIS — I10 Essential (primary) hypertension: Secondary | ICD-10-CM

## 2013-04-08 DIAGNOSIS — C50219 Malignant neoplasm of upper-inner quadrant of unspecified female breast: Secondary | ICD-10-CM

## 2013-04-08 DIAGNOSIS — C50919 Malignant neoplasm of unspecified site of unspecified female breast: Secondary | ICD-10-CM | POA: Insufficient documentation

## 2013-04-08 DIAGNOSIS — B3731 Acute candidiasis of vulva and vagina: Secondary | ICD-10-CM | POA: Insufficient documentation

## 2013-04-08 DIAGNOSIS — B373 Candidiasis of vulva and vagina: Secondary | ICD-10-CM | POA: Insufficient documentation

## 2013-04-08 DIAGNOSIS — I480 Paroxysmal atrial fibrillation: Secondary | ICD-10-CM

## 2013-04-08 DIAGNOSIS — I4891 Unspecified atrial fibrillation: Secondary | ICD-10-CM | POA: Insufficient documentation

## 2013-04-08 DIAGNOSIS — B370 Candidal stomatitis: Secondary | ICD-10-CM | POA: Insufficient documentation

## 2013-04-08 DIAGNOSIS — Z5112 Encounter for antineoplastic immunotherapy: Secondary | ICD-10-CM

## 2013-04-08 DIAGNOSIS — C50212 Malignant neoplasm of upper-inner quadrant of left female breast: Secondary | ICD-10-CM

## 2013-04-08 DIAGNOSIS — C801 Malignant (primary) neoplasm, unspecified: Secondary | ICD-10-CM | POA: Insufficient documentation

## 2013-04-08 DIAGNOSIS — C50912 Malignant neoplasm of unspecified site of left female breast: Secondary | ICD-10-CM

## 2013-04-08 DIAGNOSIS — Z7901 Long term (current) use of anticoagulants: Secondary | ICD-10-CM | POA: Insufficient documentation

## 2013-04-08 MED ORDER — ACETAMINOPHEN 325 MG PO TABS: 650.0000 mg | ORAL_TABLET | Freq: Once | ORAL | Status: DC

## 2013-04-08 MED ORDER — DEXAMETHASONE SODIUM PHOSPHATE 10 MG/ML IJ SOLN: 20.0000 mg | Freq: Once | INTRAMUSCULAR | Status: DC

## 2013-04-08 MED ORDER — ACETAMINOPHEN 325 MG PO TABS
ORAL_TABLET | ORAL | Status: AC
  Filled 2013-04-08: qty 2

## 2013-04-08 MED ORDER — TRASTUZUMAB CHEMO INJECTION 440 MG
4.0000 mg/kg | Freq: Once | INTRAVENOUS | Status: AC
  Administered 2013-04-08: 273 mg via INTRAVENOUS
  Filled 2013-04-08: qty 13

## 2013-04-08 MED ORDER — SODIUM CHLORIDE 0.9 % IV SOLN
Freq: Once | INTRAVENOUS | Status: AC
  Administered 2013-04-08: 09:00:00 via INTRAVENOUS

## 2013-04-08 MED ORDER — PACLITAXEL CHEMO INJECTION 300 MG/50ML
80.0000 mg/m2 | Freq: Once | INTRAVENOUS | Status: AC
  Administered 2013-04-08: 138 mg via INTRAVENOUS
  Filled 2013-04-08: qty 23

## 2013-04-08 MED ORDER — HEPARIN SOD (PORK) LOCK FLUSH 100 UNIT/ML IV SOLN
500.0000 [IU] | Freq: Once | INTRAVENOUS | Status: AC | PRN
  Administered 2013-04-08: 500 [IU]
  Filled 2013-04-08: qty 5

## 2013-04-08 MED ORDER — FAMOTIDINE IN NACL 20-0.9 MG/50ML-% IV SOLN
20.0000 mg | Freq: Once | INTRAVENOUS | Status: AC
  Administered 2013-04-08: 20 mg via INTRAVENOUS

## 2013-04-08 MED ORDER — SODIUM CHLORIDE 0.9 % IV SOLN: 8.0000 mg | Freq: Once | INTRAVENOUS | Status: DC

## 2013-04-08 MED ORDER — FAMOTIDINE IN NACL 20-0.9 MG/50ML-% IV SOLN
INTRAVENOUS | Status: AC
  Filled 2013-04-08: qty 50

## 2013-04-08 MED ORDER — SODIUM CHLORIDE 0.9 % IV SOLN
Freq: Once | INTRAVENOUS | Status: AC
  Administered 2013-04-08: 8 mg via INTRAVENOUS
  Filled 2013-04-08: qty 4

## 2013-04-08 MED ORDER — DIPHENHYDRAMINE HCL 50 MG/ML IJ SOLN
INTRAMUSCULAR | Status: AC
  Filled 2013-04-08: qty 1

## 2013-04-08 MED ORDER — DIPHENHYDRAMINE HCL 50 MG/ML IJ SOLN
50.0000 mg | Freq: Once | INTRAMUSCULAR | Status: AC
  Administered 2013-04-08: 50 mg via INTRAVENOUS

## 2013-04-08 MED ORDER — SODIUM CHLORIDE 0.9 % IJ SOLN: 10.0000 mL | INTRAMUSCULAR | Status: DC | PRN

## 2013-04-08 NOTE — Patient Instructions (Signed)
[facility] Discharge Instructions  RECOMMENDATIONS MADE BY THE CONSULTANT AND ANY TEST RESULTS WILL BE SENT TO YOUR REFERRING PHYSICIAN.   SPECIAL INSTRUCTIONS/FOLLOW-UP: Take anti-nausea medicine at home at the first sign of nausea. Call with any complaint: [phone] Report to the ED after hours with any complaints or for fevers, shaking chills, chest pain, shortness of breath, severe pain, or other worrisome symptoms. Return to the clinic for follow-up in 2-3 weeks.  Thank you for choosing [facility] to provide your oncology and hematology care.  To afford each patient quality time with our providers, please arrive at least 15 minutes before your scheduled appointment time.  With your help, our goal is to use those 15 minutes to complete the necessary work-up to ensure our physicians have the information they need to help with your evaluation and healthcare recommendations.    Effective January 1st, 2014, we ask that you re-schedule your appointment with our physicians should you arrive 10 or more minutes late for your appointment.  We strive to give you quality time with our providers, and arriving late affects you and other patients whose appointments are after yours.    Again, thank you for choosing [facility].  Our hope is that these requests will decrease the amount of time that you wait before being seen by our physicians.       _____________________________________________________________  Should you have questions after your visit to [facility], please contact our office at [phone] between the hours of 8:30 a.m. and 5:00 p.m.  Voicemails left after 4:30 p.m. will not be returned until the following business day.  For prescription refill requests, have your pharmacy contact our office with your prescription refill request.

## 2013-04-08 NOTE — Progress Notes (Signed)
Tylenol not given to pt, she reports she took 2 tylenol at home this am apx 8am.

## 2013-04-08 NOTE — Op Note (Signed)
NAME:  Stacie Huang, Stacie Huang NO.:  [id number]  MEDICAL RECORD NO.:  [id number]  LOCATION:  [facility]                          FACILITY:  [facility]  PHYSICIAN:  Barbaraann Barthel, M.D. DATE OF BIRTH:  06-Dec-1940  DATE OF PROCEDURE:  04/07/2013 DATE OF DISCHARGE:  04/07/2013                              OPERATIVE REPORT   PREOPERATIVE DIAGNOSIS:  Invasive ductal carcinoma of the left breast.  POSTOPERATIVE DIAGNOSIS:  Invasive ductal carcinoma of the left breast.  PROCEDURE:  Placement of right subclavian Port-A-Cath under fluoroscopy.  WOUND CLASSIFICATION:  Cleaned.  SPECIMEN:  None.  NOTE:  This is a 72 year old white female who had just been diagnosed with an invasive intraductal carcinoma of the left breast, will be treated with chemotherapy and Surgery was reconsulted for placement of Port-A-Cath.  We discussed complications of this procedure with her preoperatively including, but not limited to, bleeding, infection, pneumothorax, and catheter embolization.  Informed consent was obtained.  GROSS OPERATIVE FINDINGS:  Using the Seldinger technique, the subclavian on the right side was cannulated and under fluoroscopy, this appeared to go without any problems.  TECHNIQUE:  The patient was placed in the Trendelenburg position after prepping the right hemithorax with Betadine solution and draping in the usual manner.  An area below her right clavicle was anesthetized with 1% Xylocaine without epinephrine.  I then cannulated the right subclavian vein with an 18-gauge catheter, a guidewire was placed, and then using the venous dilator device, the Silastic catheter was placed into the superior vena cava under direct vision.  There were no intraoperative complications.  The Silastic catheter was then connected to the infusion device, which had been previously irrigated with heparinized saline solution.  This was placed in the subcutaneous pocket and then after irrigating the  pocket the wound was closed using 4-0 Polysorb for the subcutaneous layer and the skin was closed with a subcuticular running 5- 0 Polysorb suture with 0.25-inch Steri-Strips, 2x2, Neosporin and Tegaderm dressing applied.  No drain was placed.  Prior to closure, sponge, needle, and instrument counts were found to be correct.  Estimated blood loss was less than 5 mL.  There were no complications.  We are awaiting followup chest x-ray postoperatively and make sure that there is no pneumothorax and to confirm catheter tip position.     Barbaraann Barthel, M.D.     WB/MEDQ  D:  04/07/2013  T:  04/08/2013  Job:  [id number]  cc:   Mila Homer. Sudie Bailey, M.D. Fax: [phone]  Oncology Department

## 2013-04-11 ENCOUNTER — Telehealth (HOSPITAL_COMMUNITY): Payer: Self-pay | Admitting: *Deleted

## 2013-04-11 NOTE — Telephone Encounter (Signed)
Patient called me this am and states that she thinks she "over did it" @ church yday. She states she is a little sick feeling this am. She said if it didn't pass then she would take her nausea medication. I told patient to take her nausea med before it got bad. She said ok. I told her to call me back for any questions or problems. She said ok and that she would.

## 2013-04-13 ENCOUNTER — Encounter (HOSPITAL_BASED_OUTPATIENT_CLINIC_OR_DEPARTMENT_OTHER): Payer: Medicare Other

## 2013-04-13 ENCOUNTER — Ambulatory Visit (INDEPENDENT_AMBULATORY_CARE_PROVIDER_SITE_OTHER): Payer: Medicare Other | Admitting: *Deleted

## 2013-04-13 ENCOUNTER — Telehealth (HOSPITAL_COMMUNITY): Payer: Self-pay | Admitting: *Deleted

## 2013-04-13 DIAGNOSIS — C50212 Malignant neoplasm of upper-inner quadrant of left female breast: Secondary | ICD-10-CM

## 2013-04-13 DIAGNOSIS — C50219 Malignant neoplasm of upper-inner quadrant of unspecified female breast: Secondary | ICD-10-CM

## 2013-04-13 DIAGNOSIS — Z7901 Long term (current) use of anticoagulants: Secondary | ICD-10-CM

## 2013-04-13 LAB — CBC WITH DIFFERENTIAL/PLATELET
Basophils Absolute: 0 10*3/uL (ref 0.0–0.1)
Eosinophils Absolute: 0.6 10*3/uL (ref 0.0–0.7)
Eosinophils Relative: 5 % (ref 0–5)
MCH: 30.6 pg (ref 26.0–34.0)
MCV: 95 fL (ref 78.0–100.0)
Neutro Abs: 6.1 10*3/uL (ref 1.7–7.7)
Neutrophils Relative %: 51 % (ref 43–77)
Platelets: 324 10*3/uL (ref 150–400)
RDW: 13.4 % (ref 11.5–15.5)

## 2013-04-13 LAB — COMPREHENSIVE METABOLIC PANEL
ALT: 19 U/L (ref 0–35)
Albumin: 3.5 g/dL (ref 3.5–5.2)
Alkaline Phosphatase: 69 U/L (ref 39–117)
Calcium: 8.5 mg/dL (ref 8.4–10.5)
GFR calc Af Amer: 55 mL/min — ABNORMAL LOW (ref >90)
Glucose, Bld: 158 mg/dL — ABNORMAL HIGH (ref 70–99)
Sodium: 136 mEq/L (ref 135–145)
Total Protein: 6.7 g/dL (ref 6.0–8.3)

## 2013-04-13 NOTE — Progress Notes (Signed)
Patient states that she is dizzy and hasn't felt good x 3 days. A little sick on stomach but no vomiting. "Feels like she has taken a sleeping pill". Patient to wait @ [facility] for lab results.

## 2013-04-13 NOTE — Telephone Encounter (Signed)
Patient called to let me know that she has been sick for 3 days via voice message. I called patient back but only got the answering machine. I asked patient to call me back.

## 2013-04-13 NOTE — Progress Notes (Signed)
[name] presented for [employer]. Labs per MD order drawn via Peripheral Line 23 gauge needle inserted in right antecubital.  Good blood return present. Procedure without incident.  Needle removed intact. Patient tolerated procedure well.

## 2013-04-14 ENCOUNTER — Encounter (HOSPITAL_BASED_OUTPATIENT_CLINIC_OR_DEPARTMENT_OTHER): Payer: Medicare Other | Admitting: Oncology

## 2013-04-14 ENCOUNTER — Other Ambulatory Visit (HOSPITAL_COMMUNITY): Payer: Medicare Other

## 2013-04-14 ENCOUNTER — Encounter (HOSPITAL_BASED_OUTPATIENT_CLINIC_OR_DEPARTMENT_OTHER): Payer: Medicare Other

## 2013-04-14 VITALS — BP 119/65 | HR 85 | Temp 97.3°F | Resp 18 | Wt 155.2 lb

## 2013-04-14 DIAGNOSIS — B3731 Acute candidiasis of vulva and vagina: Secondary | ICD-10-CM

## 2013-04-14 DIAGNOSIS — Z7901 Long term (current) use of anticoagulants: Secondary | ICD-10-CM

## 2013-04-14 DIAGNOSIS — I4891 Unspecified atrial fibrillation: Secondary | ICD-10-CM

## 2013-04-14 DIAGNOSIS — C50219 Malignant neoplasm of upper-inner quadrant of unspecified female breast: Secondary | ICD-10-CM

## 2013-04-14 DIAGNOSIS — I480 Paroxysmal atrial fibrillation: Secondary | ICD-10-CM

## 2013-04-14 DIAGNOSIS — B370 Candidal stomatitis: Secondary | ICD-10-CM

## 2013-04-14 DIAGNOSIS — B373 Candidiasis of vulva and vagina: Secondary | ICD-10-CM

## 2013-04-14 DIAGNOSIS — C50212 Malignant neoplasm of upper-inner quadrant of left female breast: Secondary | ICD-10-CM

## 2013-04-14 DIAGNOSIS — Z5112 Encounter for antineoplastic immunotherapy: Secondary | ICD-10-CM

## 2013-04-14 LAB — CBC WITH DIFFERENTIAL/PLATELET
Eosinophils Absolute: 0.5 10*3/uL (ref 0.0–0.7)
HCT: 36.9 % (ref 36.0–46.0)
Hemoglobin: 11.8 g/dL — ABNORMAL LOW (ref 12.0–15.0)
Lymphocytes Relative: 31 % (ref 12–46)
Lymphs Abs: 2.6 10*3/uL (ref 0.7–4.0)
MCH: 30.3 pg (ref 26.0–34.0)
MCHC: 32 g/dL (ref 30.0–36.0)
Monocytes Relative: 3 % (ref 3–12)
Neutro Abs: 4.9 10*3/uL (ref 1.7–7.7)
Neutrophils Relative %: 59 % (ref 43–77)
RBC: 3.89 MIL/uL (ref 3.87–5.11)
WBC: 8.2 10*3/uL (ref 4.0–10.5)

## 2013-04-14 LAB — COMPREHENSIVE METABOLIC PANEL
ALT: 18 U/L (ref 0–35)
Albumin: 3.4 g/dL — ABNORMAL LOW (ref 3.5–5.2)
Alkaline Phosphatase: 60 U/L (ref 39–117)
BUN: 13 mg/dL (ref 6–23)
CO2: 25 mEq/L (ref 19–32)
Chloride: 101 mEq/L (ref 96–112)
GFR calc Af Amer: 62 mL/min — ABNORMAL LOW (ref >90)
GFR calc non Af Amer: 53 mL/min — ABNORMAL LOW (ref >90)
Glucose, Bld: 136 mg/dL — ABNORMAL HIGH (ref 70–99)
Potassium: 3.4 mEq/L — ABNORMAL LOW (ref 3.5–5.1)
Sodium: 138 mEq/L (ref 135–145)
Total Bilirubin: 0.7 mg/dL (ref 0.3–1.2)

## 2013-04-14 MED ORDER — FLUCONAZOLE 100 MG PO TABS: 100.0000 mg | ORAL_TABLET | Freq: Every day | ORAL | Status: DC

## 2013-04-14 MED ORDER — SODIUM CHLORIDE 0.9 % IV SOLN: 8.0000 mg | Freq: Once | INTRAVENOUS | Status: DC

## 2013-04-14 MED ORDER — FIRST-DUKES MOUTHWASH MT SUSP: 10.0000 mL | Freq: Four times a day (QID) | OROMUCOSAL | Status: DC | PRN

## 2013-04-14 MED ORDER — SODIUM CHLORIDE 0.9 % IV SOLN: Freq: Once | INTRAVENOUS | Status: DC

## 2013-04-14 MED ORDER — SODIUM CHLORIDE 0.9 % IJ SOLN
10.0000 mL | INTRAMUSCULAR | Status: DC | PRN
  Administered 2013-04-14: 10 mL

## 2013-04-14 MED ORDER — SODIUM CHLORIDE 0.9 % IV SOLN
Freq: Once | INTRAVENOUS | Status: AC
  Administered 2013-04-14: 8 mg via INTRAVENOUS
  Filled 2013-04-14: qty 4

## 2013-04-14 MED ORDER — DEXAMETHASONE SODIUM PHOSPHATE 10 MG/ML IJ SOLN: 20.0000 mg | Freq: Once | INTRAMUSCULAR | Status: DC

## 2013-04-14 MED ORDER — FAMOTIDINE IN NACL 20-0.9 MG/50ML-% IV SOLN
20.0000 mg | Freq: Once | INTRAVENOUS | Status: AC
  Administered 2013-04-14: 20 mg via INTRAVENOUS
  Filled 2013-04-14: qty 50

## 2013-04-14 MED ORDER — TRASTUZUMAB CHEMO INJECTION 440 MG
2.0000 mg/kg | Freq: Once | INTRAVENOUS | Status: AC
  Administered 2013-04-14: 147 mg via INTRAVENOUS
  Filled 2013-04-14: qty 7

## 2013-04-14 MED ORDER — SODIUM CHLORIDE 0.9 % IV SOLN
Freq: Once | INTRAVENOUS | Status: AC
  Administered 2013-04-14: 09:00:00 via INTRAVENOUS

## 2013-04-14 MED ORDER — PACLITAXEL CHEMO INJECTION 300 MG/50ML
80.0000 mg/m2 | Freq: Once | INTRAVENOUS | Status: AC
  Administered 2013-04-14: 138 mg via INTRAVENOUS
  Filled 2013-04-14: qty 23

## 2013-04-14 MED ORDER — DIPHENHYDRAMINE HCL 50 MG/ML IJ SOLN
50.0000 mg | Freq: Once | INTRAMUSCULAR | Status: AC
  Administered 2013-04-14: 50 mg via INTRAVENOUS
  Filled 2013-04-14: qty 1

## 2013-04-14 MED ORDER — HEPARIN SOD (PORK) LOCK FLUSH 100 UNIT/ML IV SOLN
500.0000 [IU] | Freq: Once | INTRAVENOUS | Status: AC | PRN
  Administered 2013-04-14: 500 [IU]
  Filled 2013-04-14: qty 5

## 2013-04-14 NOTE — Progress Notes (Signed)
Patient is seen for a work-in.  She reports that her first cycle of chemotherapy was difficult.  She is receiving weekly Paclitaxel 80 mg/m2 + herceptin and she started on 04/08/2013.  With this being her first cycle, this chemotherapy regimen should be very well tolerated at this point.  Therefore she was worked-in on my schedule today.  She reports increased fatigue.  Additionally, she reports a sore tongue and vaginal "swelling" that is not visualized, but felt by the patient.   As she describes her symptoms, I suspect some of her fatigue is psychsomatic.  Her chemotherapy regimen is well tolerated and she has only received her first cycle thus far.  She is a very anxious person to start with and now the stress of malignancy diagnosis and being on chemotherapy may be exacerbating her anxiety, as would be expected.   We will get home health to evaluate her home needs.   Her tongue soreness and vaginal swelling complaint is interesting as this is likely an indication of candidiasis.  On exam, the posterior aspect of her tongue has white plaques that appear to be oral candidiasis.  Additioanlly, she has small white spots on buccal mucosa.  I will treat her for this thrush with [employer]'s Magic Mouthwash and empirically treat her for vaginal candidiasis with Diflucan daily x 3 days.  Due to Diflucan treatment, she will need closer monitoring of her INR by the physician managing  She is agreeable to this plan.  I personally reviewed and went over laboratory results with the patient.  In addition to this treatment, I will decrease dexamethasone dose.  She is to discontinue PO dexamethasone as a pre-medication and her iV pre-medication of dexamethasone will be halved to 10 mg IV.  We will move forward with therapy as planned today as her labs meet treatment parameters.   Patient and plan discussed with Dr. Alla German and he is in agreement with the aforementioned.   Stacie Huang

## 2013-04-14 NOTE — Patient Instructions (Signed)
[facility] Discharge Instructions  RECOMMENDATIONS MADE BY THE CONSULTANT AND ANY TEST RESULTS WILL BE SENT TO YOUR REFERRING PHYSICIAN.  EXAM FINDINGS BY THE PHYSICIAN TODAY AND SIGNS OR SYMPTOMS TO REPORT TO CLINIC OR PRIMARY PHYSICIAN: Oral candidiasis (AKA Thrush) noted on exam.  Suspect vaginal candidiasis (AKA vaginal yeast infection) based on symptoms as well.  MEDICATIONS PRESCRIBED:  Duke's Magic Mouth Wash 10 mL four times daily as needed for oral thrush.  Swish and swallow. Diflucan 100 mg daily for 3 days.   Stop taking Dexamethasone (Decadron) steroid tablets at home.  INSTRUCTIONS GIVEN AND DISCUSSED: Please follow-up with your physician monitoring your INR (Coumadin level) as Diflucan and cause changes in INR. Chemotherapy as scheduled today.  SPECIAL INSTRUCTIONS/FOLLOW-UP: Return as scheduled.   Thank you for choosing [facility] to provide your oncology and hematology care.  To afford each patient quality time with our providers, please arrive at least 15 minutes before your scheduled appointment time.  With your help, our goal is to use those 15 minutes to complete the necessary work-up to ensure our physicians have the information they need to help with your evaluation and healthcare recommendations.    Effective January 1st, 2014, we ask that you re-schedule your appointment with our physicians should you arrive 10 or more minutes late for your appointment.  We strive to give you quality time with our providers, and arriving late affects you and other patients whose appointments are after yours.    Again, thank you for choosing [facility]  Our hope is that these requests will decrease the amount of time that you wait before being seen by our physicians.       _____________________________________________________________  Should you have questions after your visit to [facility], please contact our office at  [phone] between the hours of 8:30 a.m. and 5:00 p.m.  Voicemails left after 4:30 p.m. will not be returned until the following business day.  For prescription refill requests, have your pharmacy contact our office with your prescription refill request.

## 2013-04-14 NOTE — Progress Notes (Signed)
Pt reports she took 2 tylenol at home this morning before coming.

## 2013-04-18 ENCOUNTER — Telehealth (HOSPITAL_COMMUNITY): Payer: Self-pay | Admitting: *Deleted

## 2013-04-18 NOTE — Telephone Encounter (Signed)
Patient states that she is still having some burning on her vaginal area when she wipes. She does not burn when she urinates. Patient states that her mouth feels better. Patient completed her Diflucan. Patient did not want Home Health because they did not offer the services that she thought they did. She wants someone to come in her home and help her out with light cleaning etc. I instructed her to contact her social worker with Medicaid and ask her if she qualified for CAP services. Patient wrote down the word CAP and told me that she understood that she needed to call the social worker and ask if she qualified for that. Patient to call me if there are any other problems/concerns. Patient due to be seen Wednesday 12/24 for chemo and MD visit.

## 2013-04-20 ENCOUNTER — Encounter (HOSPITAL_BASED_OUTPATIENT_CLINIC_OR_DEPARTMENT_OTHER): Payer: Medicare Other

## 2013-04-20 ENCOUNTER — Encounter (HOSPITAL_COMMUNITY): Payer: Medicare Other

## 2013-04-20 ENCOUNTER — Other Ambulatory Visit (HOSPITAL_COMMUNITY): Payer: Medicare Other

## 2013-04-20 VITALS — BP 129/71 | HR 94 | Temp 97.1°F | Resp 20 | Wt 149.2 lb

## 2013-04-20 DIAGNOSIS — B373 Candidiasis of vulva and vagina: Secondary | ICD-10-CM

## 2013-04-20 DIAGNOSIS — B370 Candidal stomatitis: Secondary | ICD-10-CM

## 2013-04-20 DIAGNOSIS — C50212 Malignant neoplasm of upper-inner quadrant of left female breast: Secondary | ICD-10-CM

## 2013-04-20 DIAGNOSIS — Z7901 Long term (current) use of anticoagulants: Secondary | ICD-10-CM

## 2013-04-20 DIAGNOSIS — I10 Essential (primary) hypertension: Secondary | ICD-10-CM

## 2013-04-20 DIAGNOSIS — Z170 Estrogen receptor positive status [ER+]: Secondary | ICD-10-CM

## 2013-04-20 DIAGNOSIS — I4891 Unspecified atrial fibrillation: Secondary | ICD-10-CM

## 2013-04-20 DIAGNOSIS — C50219 Malignant neoplasm of upper-inner quadrant of unspecified female breast: Secondary | ICD-10-CM

## 2013-04-20 LAB — COMPREHENSIVE METABOLIC PANEL
ALT: 18 U/L (ref 0–35)
AST: 15 U/L (ref 0–37)
Albumin: 3.5 g/dL (ref 3.5–5.2)
Alkaline Phosphatase: 58 U/L (ref 39–117)
Calcium: 9.3 mg/dL (ref 8.4–10.5)
Chloride: 99 mEq/L (ref 96–112)
GFR calc Af Amer: 51 mL/min — ABNORMAL LOW (ref >90)
Glucose, Bld: 138 mg/dL — ABNORMAL HIGH (ref 70–99)
Sodium: 134 mEq/L — ABNORMAL LOW (ref 135–145)
Total Bilirubin: 0.6 mg/dL (ref 0.3–1.2)
Total Protein: 6.8 g/dL (ref 6.0–8.3)

## 2013-04-20 LAB — CBC WITH DIFFERENTIAL/PLATELET
Basophils Absolute: 0 10*3/uL (ref 0.0–0.1)
Basophils Relative: 0 % (ref 0–1)
Eosinophils Absolute: 0.4 10*3/uL (ref 0.0–0.7)
Eosinophils Relative: 5 % (ref 0–5)
MCH: 30.9 pg (ref 26.0–34.0)
MCHC: 32.9 g/dL (ref 30.0–36.0)
MCV: 93.7 fL (ref 78.0–100.0)
Monocytes Relative: 4 % (ref 3–12)
Neutro Abs: 3.1 10*3/uL (ref 1.7–7.7)
Platelets: 378 10*3/uL (ref 150–400)
RDW: 13.5 % (ref 11.5–15.5)
WBC: 7.3 10*3/uL (ref 4.0–10.5)

## 2013-04-20 MED ORDER — FLUCONAZOLE 200 MG PO TABS: 200.0000 mg | ORAL_TABLET | Freq: Every day | ORAL | Status: DC

## 2013-04-20 NOTE — Progress Notes (Signed)
[facility]  OFFICE PROGRESS NOTE  [name], MD [address] [place] [address]  DIAGNOSIS: Breast cancer of upper-inner quadrant of left female breast - Plan: fluconazole (DIFLUCAN) 200 MG tablet  Chronic anticoagulation - Plan: fluconazole (DIFLUCAN) 200 MG tablet  Oral candidiasis - Plan: fluconazole (DIFLUCAN) 200 MG tablet  Vaginal candidiasis - Plan: fluconazole (DIFLUCAN) 200 MG tablet  Chief Complaint  Patient presents with  . Breast Cancer    HER-2 positive    CURRENT THERAPY: Weekly Taxol/Herceptin for cycle #3 today  INTERVAL HISTORY: Stacie Huang 72 y.o. 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  She feels weak but antinausea medication is controlling nausea. She has had no episodes of vomiting. She denies any peripheral paresthesias. She appears to be overwhelmed by the idea of receiving chemotherapy. She denies any fever, night sweats, but has had irritation externally in the vaginal area. She denies any vaginal discharge, dysuria, but has had intermittent sore mouth relieved by Magic mouthwash. She denies any lower extremity swelling or redness, chest pain, PND, orthopnea, or palpitations.  MEDICAL HISTORY: Past Medical History  Diagnosis Date  . Hypertension   . Paroxysmal atrial fibrillation   . Chronic anticoagulation   . Hyperlipidemia   . GERD (gastroesophageal reflux disease)   . Cancer   . Breast cancer     INTERIM HISTORY: has Hyperlipidemia; Chronic anticoagulation; Atrial fibrillation; Hypertension; Vitamin D deficiency; Breast cancer of upper-inner quadrant of left female breast; and Paroxysmal atrial fibrillation on her problem list.   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 Malvin Johns,  sentinel node biopsy, 0.7 cm, ER positive, PR positive, HER-2/neu overexpressed. Adjuvant therapy with Taxol/Herceptin weekly x12 followed by Herceptin every 3 weeks out to one year    ALLERGIES:  is allergic to iohexol and sulfa antibiotics.  MEDICATIONS: has a current medication list which includes the following prescription(s): atenolol, calcium carbonate, cetirizine, vitamin d, diltiazem, first-dukes mouthwash, fluconazole, fluticasone, lorazepam, losartan, metoclopramide, omeprazole, prochlorperazine, simvastatin, triamcinolone cream, and warfarin.  SURGICAL HISTORY:  Past Surgical History  Procedure Laterality Date  . Abdominal hysterectomy    . Cholecystectomy    . Partial mastectomy with axillary sentinel lymph node biopsy Left 02/23/2013    Procedure: LEFT PARTIAL MASTECTOMY WITH AXILLARY SIMPLE NODE BIOPSY, LEFT BREAST WIDE EXCISION;  Surgeon: Marlane Hatcher, MD;  Location: [facility];  Service: General;  Laterality: Left;  . Axillary lymph node dissection Left 02/23/2013    Procedure: LEFT AXILLARY LYMPH NODE DISSECTION;  Surgeon: Marlane Hatcher, MD;  Location: [facility];  Service: General;  Laterality: Left;  . Portacath placement Right 04/07/2013  . Portacath placement Right 04/07/2013    Procedure: INSERTION PORT-A-CATH;  Surgeon: Marlane Hatcher, MD;  Location: [facility];  Service: General;  Laterality: Right;    FAMILY HISTORY: family history includes Cancer in an other family member; Heart attack in her father; Heart disease in her mother; Leukemia in an other family member.  SOCIAL HISTORY:  reports that she has never smoked. She has never used smokeless tobacco. She reports that she does not drink alcohol or use illicit drugs.  REVIEW OF SYSTEMS:  Other than that discussed above is noncontributory.  PHYSICAL EXAMINATION: ECOG PERFORMANCE STATUS: 1 - Symptomatic but completely ambulatory  Blood  pressure 129/71, pulse 94, temperature 97.1 F (36.2 C), temperature  source Oral, resp. rate 20, weight 149 lb 3.2 oz (67.677 kg).  GENERAL:alert, no distress and comfortable SKIN: skin color, texture, turgor are normal, no rashes or significant lesions EYES: PERLA; Conjunctiva are pink and non-injected, sclera clear OROPHARYNX:no exudate, no erythema on lips, buccal mucosa, or tongue. NECK: supple, thyroid normal size, non-tender, without nodularity. No masses CHEST: Not examined today. LYMPH:  no palpable lymphadenopathy in the cervical, axillary or inguinal LUNGS: clear to auscultation and percussion with normal breathing effort HEART: Irregularly irregular with no S3.. ABDOMEN:abdomen soft, non-tender and normal bowel sounds MUSCULOSKELETAL:no cyanosis of digits and no clubbing. Range of motion normal.  NEURO: alert & oriented x 3 with fluent speech, no focal motor/sensory deficits   LABORATORY DATA: Appointment on 04/20/2013  Component Date Value Range Status  . WBC 04/20/2013 7.3  4.0 - 10.5 K/uL Final  . RBC 04/20/2013 4.31  3.87 - 5.11 MIL/uL Final  . Hemoglobin 04/20/2013 13.3  12.0 - 15.0 g/dL Final  . HCT 16/01/9603 40.4  36.0 - 46.0 % Final  . MCV 04/20/2013 93.7  78.0 - 100.0 fL Final  . MCH 04/20/2013 30.9  26.0 - 34.0 pg Final  . MCHC 04/20/2013 32.9  30.0 - 36.0 g/dL Final  . RDW 54/12/8117 13.5  11.5 - 15.5 % Final  . Platelets 04/20/2013 378  150 - 400 K/uL Final  . Neutrophils Relative % 04/20/2013 42* 43 - 77 % Final  . Neutro Abs 04/20/2013 3.1  1.7 - 7.7 K/uL Final  . Lymphocytes Relative 04/20/2013 48* 12 - 46 % Final  . Lymphs Abs 04/20/2013 3.5  0.7 - 4.0 K/uL Final  . Monocytes Relative 04/20/2013 4  3 - 12 % Final  . Monocytes Absolute 04/20/2013 0.3  0.1 - 1.0 K/uL Final  . Eosinophils Relative 04/20/2013 5  0 - 5 % Final  . Eosinophils Absolute 04/20/2013 0.4  0.0 - 0.7 K/uL Final  . Basophils Relative 04/20/2013 0  0 - 1 % Final  . Basophils Absolute 04/20/2013 0.0  0.0 - 0.1 K/uL Final  . Sodium 04/20/2013 134* 135  - 145 mEq/L Final  . Potassium 04/20/2013 3.6  3.5 - 5.1 mEq/L Final  . Chloride 04/20/2013 99  96 - 112 mEq/L Final  . CO2 04/20/2013 21  19 - 32 mEq/L Final  . Glucose, Bld 04/20/2013 138* 70 - 99 mg/dL Final  . BUN 14/78/2956 17  6 - 23 mg/dL Final  . Creatinine, Ser 04/20/2013 1.19* 0.50 - 1.10 mg/dL Final  . Calcium 21/30/8657 9.3  8.4 - 10.5 mg/dL Final  . Total Protein 04/20/2013 6.8  6.0 - 8.3 g/dL Final  . Albumin 84/69/6295 3.5  3.5 - 5.2 g/dL Final  . AST 28/41/3244 15  0 - 37 U/L Final  . ALT 04/20/2013 18  0 - 35 U/L Final  . Alkaline Phosphatase 04/20/2013 58  39 - 117 U/L Final  . Total Bilirubin 04/20/2013 0.6  0.3 - 1.2 mg/dL Final  . GFR calc non Af Amer 04/20/2013 44* >90 mL/min Final  . GFR calc Af Amer 04/20/2013 51* >90 mL/min Final   Comment: (NOTE)                          The eGFR has been calculated using the CKD EPI equation.  This calculation has not been validated in all clinical situations.                          eGFR's persistently <90 mL/min signify possible Chronic Kidney                          Disease.  Infusion on 04/14/2013  Component Date Value Range Status  . WBC 04/14/2013 8.2  4.0 - 10.5 K/uL Final  . RBC 04/14/2013 3.89  3.87 - 5.11 MIL/uL Final  . Hemoglobin 04/14/2013 11.8* 12.0 - 15.0 g/dL Final  . HCT 21/30/8657 36.9  36.0 - 46.0 % Final  . MCV 04/14/2013 94.9  78.0 - 100.0 fL Final  . MCH 04/14/2013 30.3  26.0 - 34.0 pg Final  . MCHC 04/14/2013 32.0  30.0 - 36.0 g/dL Final  . RDW 84/69/6295 13.5  11.5 - 15.5 % Final  . Platelets 04/14/2013 337  150 - 400 K/uL Final  . Neutrophils Relative % 04/14/2013 59  43 - 77 % Final  . Neutro Abs 04/14/2013 4.9  1.7 - 7.7 K/uL Final  . Lymphocytes Relative 04/14/2013 31  12 - 46 % Final  . Lymphs Abs 04/14/2013 2.6  0.7 - 4.0 K/uL Final  . Monocytes Relative 04/14/2013 3  3 - 12 % Final  . Monocytes Absolute 04/14/2013 0.2  0.1 - 1.0 K/uL Final  . Eosinophils Relative  04/14/2013 7* 0 - 5 % Final  . Eosinophils Absolute 04/14/2013 0.5  0.0 - 0.7 K/uL Final  . Basophils Relative 04/14/2013 0  0 - 1 % Final  . Basophils Absolute 04/14/2013 0.0  0.0 - 0.1 K/uL Final  . Sodium 04/14/2013 138  135 - 145 mEq/L Final  . Potassium 04/14/2013 3.4* 3.5 - 5.1 mEq/L Final  . Chloride 04/14/2013 101  96 - 112 mEq/L Final  . CO2 04/14/2013 25  19 - 32 mEq/L Final  . Glucose, Bld 04/14/2013 136* 70 - 99 mg/dL Final  . BUN 28/41/3244 13  6 - 23 mg/dL Final  . Creatinine, Ser 04/14/2013 1.02  0.50 - 1.10 mg/dL Final  . Calcium 04/30/7251 8.6  8.4 - 10.5 mg/dL Final  . Total Protein 04/14/2013 6.4  6.0 - 8.3 g/dL Final  . Albumin 66/44/0347 3.4* 3.5 - 5.2 g/dL Final  . AST 42/59/5638 21  0 - 37 U/L Final  . ALT 04/14/2013 18  0 - 35 U/L Final  . Alkaline Phosphatase 04/14/2013 60  39 - 117 U/L Final  . Total Bilirubin 04/14/2013 0.7  0.3 - 1.2 mg/dL Final  . GFR calc non Af Amer 04/14/2013 53* >90 mL/min Final  . GFR calc Af Amer 04/14/2013 62* >90 mL/min Final   Comment: (NOTE)                          The eGFR has been calculated using the CKD EPI equation.                          This calculation has not been validated in all clinical situations.                          eGFR's persistently <90 mL/min signify possible Chronic Kidney  Disease.  Anti-coag visit on 04/13/2013  Component Date Value Range Status  . INR 04/13/2013 1.4   Final  Infusion on 04/13/2013  Component Date Value Range Status  . WBC 04/13/2013 11.8* 4.0 - 10.5 K/uL Final  . RBC 04/13/2013 4.22  3.87 - 5.11 MIL/uL Final  . Hemoglobin 04/13/2013 12.9  12.0 - 15.0 g/dL Final  . HCT 91/47/8295 40.1  36.0 - 46.0 % Final  . MCV 04/13/2013 95.0  78.0 - 100.0 fL Final  . MCH 04/13/2013 30.6  26.0 - 34.0 pg Final  . MCHC 04/13/2013 32.2  30.0 - 36.0 g/dL Final  . RDW 62/13/0865 13.4  11.5 - 15.5 % Final  . Platelets 04/13/2013 324  150 - 400 K/uL Final  . Neutrophils  Relative % 04/13/2013 51  43 - 77 % Final  . Neutro Abs 04/13/2013 6.1  1.7 - 7.7 K/uL Final  . Lymphocytes Relative 04/13/2013 41  12 - 46 % Final  . Lymphs Abs 04/13/2013 4.8* 0.7 - 4.0 K/uL Final  . Monocytes Relative 04/13/2013 3  3 - 12 % Final  . Monocytes Absolute 04/13/2013 0.3  0.1 - 1.0 K/uL Final  . Eosinophils Relative 04/13/2013 5  0 - 5 % Final  . Eosinophils Absolute 04/13/2013 0.6  0.0 - 0.7 K/uL Final  . Basophils Relative 04/13/2013 0  0 - 1 % Final  . Basophils Absolute 04/13/2013 0.0  0.0 - 0.1 K/uL Final  . Sodium 04/13/2013 136  135 - 145 mEq/L Final  . Potassium 04/13/2013 3.6  3.5 - 5.1 mEq/L Final  . Chloride 04/13/2013 99  96 - 112 mEq/L Final  . CO2 04/13/2013 26  19 - 32 mEq/L Final  . Glucose, Bld 04/13/2013 158* 70 - 99 mg/dL Final  . BUN 78/46/9629 18  6 - 23 mg/dL Final  . Creatinine, Ser 04/13/2013 1.12* 0.50 - 1.10 mg/dL Final  . Calcium 52/84/1324 8.5  8.4 - 10.5 mg/dL Final  . Total Protein 04/13/2013 6.7  6.0 - 8.3 g/dL Final  . Albumin 40/01/2724 3.5  3.5 - 5.2 g/dL Final  . AST 36/64/4034 25  0 - 37 U/L Final  . ALT 04/13/2013 19  0 - 35 U/L Final  . Alkaline Phosphatase 04/13/2013 69  39 - 117 U/L Final  . Total Bilirubin 04/13/2013 0.5  0.3 - 1.2 mg/dL Final  . GFR calc non Af Amer 04/13/2013 48* >90 mL/min Final  . GFR calc Af Amer 04/13/2013 55* >90 mL/min Final   Comment: (NOTE)                          The eGFR has been calculated using the CKD EPI equation.                          This calculation has not been validated in all clinical situations.                          eGFR's persistently <90 mL/min signify possible Chronic Kidney                          Disease.  Admission on 04/07/2013, Discharged on 04/07/2013  Component Date Value Range Status  . Prothrombin Time 04/07/2013 13.7  11.6 - 15.2 seconds Final  . INR 04/07/2013 1.07  0.00 - 1.49 Final  Hospital Outpatient  Visit on 04/05/2013  Component Date Value Range Status  .  WBC 04/05/2013 8.6  4.0 - 10.5 K/uL Final  . RBC 04/05/2013 4.28  3.87 - 5.11 MIL/uL Final  . Hemoglobin 04/05/2013 13.0  12.0 - 15.0 g/dL Final  . HCT 16/01/9603 40.8  36.0 - 46.0 % Final  . MCV 04/05/2013 95.3  78.0 - 100.0 fL Final  . MCH 04/05/2013 30.4  26.0 - 34.0 pg Final  . MCHC 04/05/2013 31.9  30.0 - 36.0 g/dL Final  . RDW 54/12/8117 13.1  11.5 - 15.5 % Final  . Platelets 04/05/2013 345  150 - 400 K/uL Final  . Neutrophils Relative % 04/05/2013 44  43 - 77 % Final  . Neutro Abs 04/05/2013 3.8  1.7 - 7.7 K/uL Final  . Lymphocytes Relative 04/05/2013 44  12 - 46 % Final  . Lymphs Abs 04/05/2013 3.8  0.7 - 4.0 K/uL Final  . Monocytes Relative 04/05/2013 8  3 - 12 % Final  . Monocytes Absolute 04/05/2013 0.7  0.1 - 1.0 K/uL Final  . Eosinophils Relative 04/05/2013 3  0 - 5 % Final  . Eosinophils Absolute 04/05/2013 0.3  0.0 - 0.7 K/uL Final  . Basophils Relative 04/05/2013 1  0 - 1 % Final  . Basophils Absolute 04/05/2013 0.1  0.0 - 0.1 K/uL Final  . Sodium 04/05/2013 135  135 - 145 mEq/L Final  . Potassium 04/05/2013 4.7  3.5 - 5.1 mEq/L Final  . Chloride 04/05/2013 98  96 - 112 mEq/L Final  . CO2 04/05/2013 27  19 - 32 mEq/L Final  . Glucose, Bld 04/05/2013 124* 70 - 99 mg/dL Final  . BUN 14/78/2956 14  6 - 23 mg/dL Final  . Creatinine, Ser 04/05/2013 1.10  0.50 - 1.10 mg/dL Final  . Calcium 21/30/8657 9.5  8.4 - 10.5 mg/dL Final  . GFR calc non Af Amer 04/05/2013 49* >90 mL/min Final  . GFR calc Af Amer 04/05/2013 56* >90 mL/min Final   Comment: (NOTE)                          The eGFR has been calculated using the CKD EPI equation.                          This calculation has not been validated in all clinical situations.                          eGFR's persistently <90 mL/min signify possible Chronic Kidney                          Disease.  Anti-coag visit on 03/31/2013  Component Date Value Range Status  . INR 03/31/2013 2.0   Final    PATHOLOGY: No new pathology.  Tumor overexpressed HER-2/neu.  Urinalysis    Component Value Date/Time   COLORURINE YELLOW 03/15/2009 1509   APPEARANCEUR CLEAR 03/15/2009 1509   LABSPEC 1.010 03/15/2009 1509   PHURINE 6.0 03/15/2009 1509   GLUCOSEU NEGATIVE 03/15/2009 1509   HGBUR NEGATIVE 03/15/2009 1509   BILIRUBINUR NEGATIVE 03/15/2009 1509   KETONESUR NEGATIVE 03/15/2009 1509   PROTEINUR NEGATIVE 03/15/2009 1509   UROBILINOGEN 0.2 03/15/2009 1509   NITRITE NEGATIVE 03/15/2009 1509   LEUKOCYTESUR NEGATIVE MICROSCOPIC NOT DONE ON URINES WITH NEGATIVE PROTEIN, BLOOD, LEUKOCYTES, NITRITE, OR GLUCOSE <1000 mg/dL. 03/15/2009 1509  RADIOGRAPHIC STUDIES: Nm Cardiac Muga Rest  03/21/2013   CLINICAL DATA:  Breast cancer, baseline assessment prior to Herceptin therapy, history atrial fibrillation  EXAM: NUCLEAR MEDICINE CARDIAC BLOOD POOL IMAGING (MUGA)  TECHNIQUE: Cardiac multi-gated acquisition was performed at rest following intravenous injection of Tc-45m labeled red blood cells. Patient a rhythmic during imaging  COMPARISON:  None  Correlation:  Gated SPECT myocardial perfusion imaging 11/08/2009  RADIOPHARMACEUTICALS:  27.4MILLI CURIE ULTRATAG TECHNETIUM TC 61M-LABELED RED BLOOD CELLS IV KITmCiTc-36m in-vitro labeled red blood cells.  FINDINGS: Calculated left ventricular ejection fraction is 63%, normal.  Calculated LVEF on prior gated myocardial perfusion SPECT imaging with 74%.  Wall motion analysis in 3 projections demonstrates normal left ventricular wall motion.  Examination was obtained at a cardiac rate of 77 beats per min average.  IMPRESSION: Normal left ventricular ejection fraction of 63%.  Normal LV wall motion.   Electronically Signed   By: Ulyses Southward M.D.   On: 03/21/2013 17:07   Dg Chest Portable 1 View  04/07/2013   CLINICAL DATA:  Port placement.  EXAM: PORTABLE CHEST - 1 VIEW  COMPARISON:  06/07/2012  FINDINGS: There has been interval placement of a right subclavian Port-A-Cath with tip projecting  over the upper SVC. The cardiac silhouette is upper limits of normal in size. Lung volumes are mildly diminished compared to the prior exam with mild crowding of the pulmonary vasculature and mild bibasilar opacity. No pneumothorax or pleural effusion is identified. Surgical clips are noted in the left axilla. Multilevel degenerative osteophytosis is noted in the thoracic spine.  IMPRESSION: 1. Right subclavian Port-A-Cath placement as above. No pneumothorax identified. 2. Hypoinflation with mild bibasilar atelectasis.   Electronically Signed   By: Sebastian Ache   On: 04/07/2013 14:10   Dg C-arm 1-60 Min-no Report  04/07/2013   CLINICAL DATA: port-a-cath insertion   C-ARM 1-60 MINUTES  Fluoroscopy was utilized by the requesting physician.  No radiographic  interpretation.     ASSESSMENT:  #1. Poor tolerance and acceptance of chemotherapy treatment. #2.Stage I (T1 b. N0 M0) duct cell carcinoma left upper inner quadrant, status post lumpectomy, sentinel node biopsy, 0.7 cm, ER positive, PR positive, HER-2/neu overexpressed. #3. Chronic atrial fibrillation with controlled ventricular response, on warfarin. #4. Hypertension, controlled. #5. Allergic rhinitis, on treatment. #6. Oral and vulvar candidiasis.   PLAN:  #1. Hold chemotherapy this week. #2. Diflucan 200 mg daily. #3. Continue Compazine and Reglan for nausea and to continue Magic mouthwash as needed. #4. Followup in one week. The [facility] we may to at least get 6 cycles of weekly combination Taxol and Herceptin prior to Herceptin alone. #5. The patient was reassured in the presence of her friend.   All questions were answered. The patient knows to call the clinic with any problems, questions or concerns. We can certainly see the patient much sooner if necessary.   I spent 25 minutes counseling the patient face to face. The total time spent in the appointment was 30 minutes.    Maurilio Lovely, MD 04/20/2013 10:04 AM

## 2013-04-20 NOTE — Progress Notes (Signed)
Labs drawn today for cbc/diff,cmp 

## 2013-04-20 NOTE — Patient Instructions (Signed)
[facility] Discharge Instructions  RECOMMENDATIONS MADE BY THE CONSULTANT AND ANY TEST RESULTS WILL BE SENT TO YOUR REFERRING PHYSICIAN.  EXAM FINDINGS BY THE PHYSICIAN TODAY AND SIGNS OR SYMPTOMS TO REPORT TO CLINIC OR PRIMARY PHYSICIAN:   Return 04/27/13 @ 8:30 to see Dr. Zigmund Daniel and then for chemo  Dr. Zigmund Daniel is calling you in some Diflucan. Take the entire dosage that he calls in.   Continue taking your Magic Mouthwash four times a day to clear up the soreness in your mouth.  Continue taking your Metoclopramide/Prochlorperazine for nausea around the clock.   Thank you for choosing [facility] to provide your oncology and hematology care.  To afford each patient quality time with our providers, please arrive at least 15 minutes before your scheduled appointment time.  With your help, our goal is to use those 15 minutes to complete the necessary work-up to ensure our physicians have the information they need to help with your evaluation and healthcare recommendations.    Effective January 1st, 2014, we ask that you re-schedule your appointment with our physicians should you arrive 10 or more minutes late for your appointment.  We strive to give you quality time with our providers, and arriving late affects you and other patients whose appointments are after yours.    Again, thank you for choosing [facility].  Our hope is that these requests will decrease the amount of time that you wait before being seen by our physicians.       _____________________________________________________________  Should you have questions after your visit to [facility], please contact our office at [phone] between the hours of 8:30 a.m. and 5:00 p.m.  Voicemails left after 4:30 p.m. will not be returned until the following business day.  For prescription refill requests, have your pharmacy contact our office with your prescription refill  request.

## 2013-04-21 ENCOUNTER — Emergency Department (HOSPITAL_COMMUNITY): Payer: Medicare Other

## 2013-04-21 ENCOUNTER — Other Ambulatory Visit: Payer: Self-pay

## 2013-04-21 ENCOUNTER — Encounter (HOSPITAL_COMMUNITY): Payer: Self-pay | Admitting: Emergency Medicine

## 2013-04-21 ENCOUNTER — Emergency Department (HOSPITAL_COMMUNITY)
Admission: EM | Admit: 2013-04-21 | Discharge: 2013-04-21 | Disposition: A | Discharge status: Home / Self Care | Payer: Medicare Other | Attending: Emergency Medicine | Admitting: Emergency Medicine

## 2013-04-21 DIAGNOSIS — I10 Essential (primary) hypertension: Secondary | ICD-10-CM | POA: Insufficient documentation

## 2013-04-21 DIAGNOSIS — Z7901 Long term (current) use of anticoagulants: Secondary | ICD-10-CM | POA: Insufficient documentation

## 2013-04-21 DIAGNOSIS — I4891 Unspecified atrial fibrillation: Secondary | ICD-10-CM | POA: Insufficient documentation

## 2013-04-21 DIAGNOSIS — Z853 Personal history of malignant neoplasm of breast: Secondary | ICD-10-CM | POA: Insufficient documentation

## 2013-04-21 DIAGNOSIS — K219 Gastro-esophageal reflux disease without esophagitis: Secondary | ICD-10-CM | POA: Insufficient documentation

## 2013-04-21 DIAGNOSIS — R5381 Other malaise: Secondary | ICD-10-CM | POA: Insufficient documentation

## 2013-04-21 DIAGNOSIS — E785 Hyperlipidemia, unspecified: Secondary | ICD-10-CM | POA: Insufficient documentation

## 2013-04-21 DIAGNOSIS — Z9221 Personal history of antineoplastic chemotherapy: Secondary | ICD-10-CM | POA: Insufficient documentation

## 2013-04-21 DIAGNOSIS — Z79899 Other long term (current) drug therapy: Secondary | ICD-10-CM | POA: Insufficient documentation

## 2013-04-21 DIAGNOSIS — IMO0002 Reserved for concepts with insufficient information to code with codable children: Secondary | ICD-10-CM | POA: Insufficient documentation

## 2013-04-21 DIAGNOSIS — R531 Weakness: Secondary | ICD-10-CM

## 2013-04-21 LAB — CBC WITH DIFFERENTIAL/PLATELET
Basophils Absolute: 0 10*3/uL (ref 0.0–0.1)
Basophils Relative: 0 % (ref 0–1)
Eosinophils Relative: 4 % (ref 0–5)
HCT: 39.7 % (ref 36.0–46.0)
MCH: 30.4 pg (ref 26.0–34.0)
MCHC: 32.5 g/dL (ref 30.0–36.0)
Monocytes Absolute: 0.5 10*3/uL (ref 0.1–1.0)
Neutro Abs: 3.8 10*3/uL (ref 1.7–7.7)
Platelets: 367 10*3/uL (ref 150–400)
RDW: 13.7 % (ref 11.5–15.5)

## 2013-04-21 LAB — URINALYSIS, ROUTINE W REFLEX MICROSCOPIC
Glucose, UA: NEGATIVE mg/dL
Ketones, ur: NEGATIVE mg/dL
Protein, ur: NEGATIVE mg/dL
Urobilinogen, UA: 0.2 mg/dL (ref 0.0–1.0)
pH: 5.5 (ref 5.0–8.0)

## 2013-04-21 LAB — URINE MICROSCOPIC-ADD ON

## 2013-04-21 LAB — BASIC METABOLIC PANEL
Calcium: 9 mg/dL (ref 8.4–10.5)
Chloride: 98 mEq/L (ref 96–112)
Creatinine, Ser: 1.13 mg/dL — ABNORMAL HIGH (ref 0.50–1.10)
GFR calc Af Amer: 54 mL/min — ABNORMAL LOW (ref >90)
Sodium: 133 mEq/L — ABNORMAL LOW (ref 135–145)

## 2013-04-21 MED ORDER — SODIUM CHLORIDE 0.9 % IV BOLUS (SEPSIS)
1000.0000 mL | Freq: Once | INTRAVENOUS | Status: AC
  Administered 2013-04-21: 1000 mL via INTRAVENOUS

## 2013-04-21 NOTE — ED Provider Notes (Signed)
CSN: [id number]     Arrival date & time 04/21/13  4098 History   First MD Initiated Contact with Patient 04/21/13 1006     Chief Complaint  Patient presents with  . Weakness   (Consider location/radiation/quality/duration/timing/severity/associated sxs/prior Treatment) HPI Comments: 72 year old female who has breast cancer and is on chemotherapy presents feeling weak for 5 days. She states she feels overall weakness, no focal or lateralizing symptoms. States her mouth also feels dry. She last received chemotherapy 8 days ago. She states she felt like this after her first chemotherapy treatment but states that it did not last this long. She's been drinking Gatorade but still fills fatigue. She states it is not particularly getting worse but that it is not getting any better. She denies any headaches, chest pain, shortness of breath, rhinorrhea, congestion, cough, abdominal pain, nausea, vomiting, or constipation. She states she had a loose stools morning as were she's about to have diarrhea. Denies any urinary symptoms. Denies any fevers.   Past Medical History  Diagnosis Date  . Hypertension   . Paroxysmal atrial fibrillation   . Chronic anticoagulation   . Hyperlipidemia   . GERD (gastroesophageal reflux disease)   . Cancer   . Breast cancer    Past Surgical History  Procedure Laterality Date  . Abdominal hysterectomy    . Cholecystectomy    . Partial mastectomy with axillary sentinel lymph node biopsy Left 02/23/2013    Procedure: LEFT PARTIAL MASTECTOMY WITH AXILLARY SIMPLE NODE BIOPSY, LEFT BREAST WIDE EXCISION;  Surgeon: Marlane Hatcher, MD;  Location: [facility];  Service: General;  Laterality: Left;  . Axillary lymph node dissection Left 02/23/2013    Procedure: LEFT AXILLARY LYMPH NODE DISSECTION;  Surgeon: Marlane Hatcher, MD;  Location: [facility];  Service: General;  Laterality: Left;  . Portacath placement Right 04/07/2013  . Portacath placement Right 04/07/2013     Procedure: INSERTION PORT-A-CATH;  Surgeon: Marlane Hatcher, MD;  Location: [facility];  Service: General;  Laterality: Right;   Family History  Problem Relation Age of Onset  . Heart disease Mother     Also 5 of 9 siblings  . Heart attack Father   . Cancer      2 siblings  . Leukemia     History  Substance Use Topics  . Smoking status: Never Smoker   . Smokeless tobacco: Never Used  . Alcohol Use: No   OB History   Grav Para Term Preterm Abortions TAB SAB Ect Mult Living   2 2 2             Review of Systems  Constitutional: Positive for fatigue. Negative for fever and chills.  Respiratory: Negative for cough and shortness of breath.   Cardiovascular: Negative for chest pain.  Gastrointestinal: Negative for vomiting and abdominal pain.  Genitourinary: Negative for dysuria.  Musculoskeletal: Negative for myalgias.  Neurological: Positive for weakness. Negative for dizziness, light-headedness, numbness and headaches.  All other systems reviewed and are negative.    Allergies  Iohexol and Sulfa antibiotics  Home Medications   Current Outpatient Rx  Name  Route  Sig  Dispense  Refill  . atenolol (TENORMIN) 100 MG tablet   Oral   Take 50 mg by mouth daily before breakfast.          . calcium carbonate (OS-CAL) 600 MG TABS tablet   Oral   Take 600 mg by mouth daily.         . cetirizine (ZYRTEC) 10  MG tablet   Oral   Take 10 mg by mouth every evening.          . Cholecalciferol (VITAMIN D) 400 UNITS capsule   Oral   Take 400 Units by mouth every morning.          . diltiazem (TIAZAC) 120 MG 24 hr capsule   Oral   Take 120 mg by mouth every morning.         . Diphenhyd-Hydrocort-Nystatin (FIRST-DUKES MOUTHWASH) SUSP   Mouth/Throat   Use as directed 10 mLs in the mouth or throat 4 (four) times daily as needed.   300 mL   1     Please deliver   . fluconazole (DIFLUCAN) 200 MG tablet   Oral   Take 1 tablet (200 mg total) by mouth daily.   10  tablet   1     Please deliver   . fluticasone (FLONASE) 50 MCG/ACT nasal spray   Nasal   Place 2 sprays into the nose at bedtime.          Marland Kitchen LORazepam (ATIVAN) 1 MG tablet   Oral   Take 1 mg by mouth daily as needed for anxiety (AND/OR FOR SLEEP).          Marland Kitchen losartan (COZAAR) 50 MG tablet   Oral   Take 50 mg by mouth every morning.          . metoCLOPramide (REGLAN) 5 MG tablet   Oral   Take 1 tablet (5 mg total) by mouth 4 (four) times daily as needed for nausea or vomiting.   60 tablet   3   . omeprazole (PRILOSEC) 40 MG capsule   Oral   Take 40 mg by mouth every morning.          . prochlorperazine (COMPAZINE) 10 MG tablet   Oral   Take 1 tablet (10 mg total) by mouth 4 (four) times daily as needed for nausea or vomiting.   60 tablet   3   . simvastatin (ZOCOR) 10 MG tablet   Oral   Take 10 mg by mouth at bedtime.           . triamcinolone cream (KENALOG) 0.1 %   Topical   Apply 1 application topically at bedtime.         Marland Kitchen warfarin (COUMADIN) 2.5 MG tablet   Oral   Take 1.25-2.5 mg by mouth See admin instructions. Takes 2.5 mg (1 tablet) on Tuesday and Saturday takes 1.25 mg (0.5 tablet) all other days          BP 149/78  Pulse 115  Temp(Src) 97.6 F (36.4 C) (Oral)  Resp 16  Ht 5\' 1"  (1.549 m)  Wt 145 lb (65.772 kg)  BMI 27.41 kg/m2  SpO2 98% Physical Exam  Nursing note and vitals reviewed. Constitutional: She is oriented to person, place, and time. She appears well-developed and well-nourished.  HENT:  Head: Normocephalic and atraumatic.  Right Ear: External ear normal.  Left Ear: External ear normal.  Nose: Nose normal.  Eyes: EOM are normal. Pupils are equal, round, and reactive to light. Right eye exhibits no discharge. Left eye exhibits no discharge.  Cardiovascular: Normal rate, regular rhythm and normal heart sounds.   Pulmonary/Chest: Effort normal and breath sounds normal.  Abdominal: Soft. She exhibits no distension. There  is no tenderness.  Neurological: She is alert and oriented to person, place, and time. She has normal strength. No cranial nerve deficit or  sensory deficit. GCS eye subscore is 4. GCS verbal subscore is 5. GCS motor subscore is 6.  Skin: Skin is warm and dry.    ED Course  Procedures (including critical care time) Labs Review Labs Reviewed  BASIC METABOLIC PANEL - Abnormal; Notable for the following:    Sodium 133 (*)    Glucose, Bld 139 (*)    Creatinine, Ser 1.13 (*)    GFR calc non Af Amer 47 (*)    GFR calc Af Amer 54 (*)    All other components within normal limits  URINALYSIS, ROUTINE W REFLEX MICROSCOPIC - Abnormal; Notable for the following:    Hgb urine dipstick TRACE (*)    Leukocytes, UA TRACE (*)    All other components within normal limits  URINE MICROSCOPIC-ADD ON - Abnormal; Notable for the following:    Bacteria, UA FEW (*)    Casts GRANULAR CAST (*)    All other components within normal limits  CBC WITH DIFFERENTIAL  LACTIC ACID, PLASMA   Imaging Review Dg Chest 2 View  04/21/2013   CLINICAL DATA:  Generalized weakness, nausea, vomiting, breast cancer  EXAM: CHEST  2 VIEW  COMPARISON:  04/07/2013  FINDINGS: Right subclavian central line tip at the innominate venous confluence level. This is unchanged. Stable cardiomegaly. No CHF, edema pattern, pneumonia, collapse or consolidation. No effusion or pneumothorax. Trachea midline. Degenerative changes of the spine with a mild scoliosis. Postop changes in the left axilla.  IMPRESSION: Cardiomegaly without acute chest process.  No interval change.   Electronically Signed   By: Ruel Favors M.D.   On: 04/21/2013 11:21    EKG Interpretation   None       Date: 04/21/2013  Rate: 84  Rhythm: atrial fibrillation  QRS Axis: right  Intervals: normal  ST/T Wave abnormalities: nonspecific ST/T changes  Conduction Disutrbances:none  Narrative Interpretation:   Old EKG Reviewed: unchanged   MDM   1. Weakness     Patient has normal strength, normal cranial nerve testing, and overall normal exam. She not appear overtly dehydrated. She is mildly tachycardic, and does feel somewhat improved with fluids. There's no obvious source for weakness, including no signs of bacterial infection. She has no symptoms suggestive of flu. I feel her weakness is most likely from her chemotherapy. Encourage her to keep up good fluid intake and to call her oncologist tomorrow. I feel she is stable for outpatient workup/treatment the patient feels comfortable going home.    Audree Camel, MD 04/21/13 [phone]

## 2013-04-21 NOTE — ED Notes (Signed)
Patient with c/o "feeling like I'm in a tunnel". C/o generalized weakness. Denies n/v/d. Denies pain. Breast cancer patient, currently taking chemo.

## 2013-04-21 NOTE — ED Notes (Signed)
Patient with no complaints at this time. Respirations even and unlabored. Skin warm/dry. Discharge instructions reviewed with patient at this time. Patient given opportunity to voice concerns/ask questions. IV removed per policy and band-aid applied to site. Patient discharged at this time and left Emergency Department via wheelchair. Patient in NAD 

## 2013-04-25 ENCOUNTER — Telehealth (HOSPITAL_COMMUNITY): Payer: Self-pay | Admitting: *Deleted

## 2013-04-25 NOTE — Telephone Encounter (Signed)
Patient called this am to let me know that she has a rash from underneath her breasts bilaterally to her groin bilaterally. She says that it is not one uniform rash - that it is broken up. She states that it itches some but does not hurt and is not draining. After speaking with Dr. Zigmund Daniel he recommended that patient use Hydrocortisone cream in the am and pm. We will see patient on Wednesday 12/31 for an office visit and assess the rash then. If rash worsens, patient is to call me back. Patient verbalized understanding of these instructions.

## 2013-04-27 ENCOUNTER — Ambulatory Visit (HOSPITAL_COMMUNITY): Payer: Medicare Other

## 2013-04-27 ENCOUNTER — Other Ambulatory Visit (HOSPITAL_COMMUNITY): Payer: Medicare Other

## 2013-04-27 ENCOUNTER — Inpatient Hospital Stay (HOSPITAL_COMMUNITY): Payer: Medicare Other

## 2013-05-03 NOTE — Progress Notes (Signed)
Stacie Bellow, MD [address] [place] [address]  Breast cancer of upper-inner quadrant of left female breast - Plan: CBC with Differential, Comprehensive metabolic panel  CURRENT THERAPY: S/P 2 cycles of weekly paclitaxel/herceptin.  Will transition to Herceptin every 3 weeks today (05/04/2013).  INTERVAL HISTORY: Stacie Huang 74 y.o. 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 Romona Curls, sentinel node biopsy, 0.7 cm, ER positive, PR positive, HER-2/neu overexpressed.  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 Subsequent lumpectomy and 2 sentinel lymph nodes revealed final primary tumor size of 0.7 cm  with negative nodes. Plan is to treat adjuvantly with Taxol and Herceptin weekly for 12 cycles followed by every 3 week Herceptin out of one year with anastrozole 1 mg daily given concurrently with Herceptin alone. No radiotherapy was recommended.     Breast cancer of upper-inner quadrant of left female breast   02/04/2013 Initial Diagnosis Breast cancer of upper-inner quadrant of left female breast   02/23/2013 Surgery left lumpectomy (UIQ), sentinel node biopsy--0.7cm, Node negative, ER+, PR+, HER-2/neu over expressed.   04/08/2013 - 04/14/2013 Chemotherapy Paclitaxel/Herceptin weekly x 12. Intolerance to Paclitaxel   05/04/2013 -  Chemotherapy Herceptin every 21 days to complete 1 year worth     I personally reviewed and went over laboratory results with the patient.  Tequlia reported to the ED on 04/21/2013 for weakness.  No cause, other than chemotherapy, was discovered.   Labs were unremarkable, but her urine showed leukocytes. She denies any urinary complaints at this time.   She  has not tolerated chemotherapy well since initiation with a regimen that should be well tolerated.  She admits to severe fatigue and tiredness that has interfered with her quality of life.  As a result, we will discontinue Paclitaxel and move on to Herceptin every 3 weeks x 52 weeks.  She reports an erythematous rash on the dorsal aspect of both hands and posterior aspect of both forearms.  She denies any pain, but notes that it is pruritic. There are some excoriations noted on her arms and hands from scratching.   Oncologically, she otherwise denies any complaints and ROS questioning is negative.   Past Medical History  Diagnosis Date  . Hypertension   . Paroxysmal atrial fibrillation   . Chronic anticoagulation   . Hyperlipidemia   . GERD (gastroesophageal reflux disease)   . Cancer   . Breast cancer     has Hyperlipidemia; Chronic anticoagulation; Atrial fibrillation; Hypertension; Vitamin D deficiency; Breast cancer of upper-inner quadrant of left female breast; and Paroxysmal atrial fibrillation on her problem list.     is allergic to iohexol and sulfa antibiotics.  Ms. Aispuro had no medications administered during this visit.  Past Surgical History  Procedure Laterality Date  . Abdominal hysterectomy    . Cholecystectomy    . Partial mastectomy with axillary sentinel lymph node biopsy Left 02/23/2013    Procedure: LEFT PARTIAL MASTECTOMY WITH AXILLARY SIMPLE NODE BIOPSY, LEFT BREAST WIDE EXCISION;  Surgeon: Scherry Ran, MD;  Location: [facility];  Service: General;  Laterality: Left;  . Axillary lymph node dissection Left 02/23/2013    Procedure: LEFT AXILLARY LYMPH NODE DISSECTION;  Surgeon: Scherry Ran, MD;  Location: [facility];  Service: General;  Laterality: Left;  . Portacath placement Right 04/07/2013  . Portacath placement Right 04/07/2013    Procedure: INSERTION PORT-A-CATH;  Surgeon: Scherry Ran, MD;  Location: [facility];  Service: General;  Laterality:  Right;    Denies any headaches, dizziness, double vision, fevers, chills, night sweats, nausea, vomiting, diarrhea, constipation, chest pain, heart palpitations, shortness of breath, blood in stool, black tarry stool, urinary pain, urinary burning, urinary frequency, hematuria.   PHYSICAL EXAMINATION  ECOG PERFORMANCE STATUS: 1 - Symptomatic but completely ambulatory  Filed Vitals:   05/04/13 0931  BP: 125/76  Pulse: 63  Temp: 97.3 F (36.3 C)  Resp: 20    GENERAL:alert, no distress, well nourished, well developed, comfortable, cooperative and smiling SKIN: skin color, texture, turgor are normal, no rashes or significant lesions HEAD: Normocephalic, No masses, lesions, tenderness or abnormalities EYES: normal, PERRLA, EOMI, Conjunctiva are pink and non-injected EARS: External ears normal OROPHARYNX:mucous membranes are moist  NECK: supple, no adenopathy, thyroid normal size, non-tender, without nodularity, no stridor, non-tender, trachea midline LYMPH:  no palpable lymphadenopathy BREAST:not examined LUNGS: clear to auscultation and percussion HEART: regular rate & rhythm, no murmurs and no gallops ABDOMEN:abdomen soft, non-tender and normal bowel sounds BACK: Back symmetric, no curvature. EXTREMITIES:less then 2 second capillary refill, no joint deformities, effusion, or inflammation, no skin discoloration, no clubbing, no cyanosis  NEURO: alert & oriented x 3 with fluent speech, no focal motor/sensory deficits, gait normal    LABORATORY DATA: CBC    Component Value Date/Time   WBC 7.9 04/21/2013 0948   RBC 4.25 04/21/2013 0948   HGB 12.9 04/21/2013 0948   HCT 39.7 04/21/2013 0948   PLT 367 04/21/2013 0948   MCV 93.4 04/21/2013 0948   MCH 30.4 04/21/2013 0948   MCHC 32.5 04/21/2013 0948   RDW 13.7 04/21/2013 0948   LYMPHSABS 3.3 04/21/2013 0948   MONOABS 0.5 04/21/2013 0948   EOSABS 0.3 04/21/2013 0948   BASOSABS 0.0 04/21/2013 0948      Chemistry        Component Value Date/Time   NA 133* 04/21/2013 0948   K 3.6 04/21/2013 0948   CL 98 04/21/2013 0948   CO2 22 04/21/2013 0948   BUN 16 04/21/2013 0948   CREATININE 1.13* 04/21/2013 0948      Component Value Date/Time   CALCIUM 9.0 04/21/2013 0948   ALKPHOS 58 04/20/2013 0835   AST 15 04/20/2013 0835   ALT 18 04/20/2013 0835   BILITOT 0.6 04/20/2013 0835       RADIOGRAPHIC STUDIES:  04/21/2013  CLINICAL DATA: Generalized weakness, nausea, vomiting, breast  cancer  EXAM:  CHEST 2 VIEW  COMPARISON: 04/07/2013  FINDINGS:  Right subclavian central line tip at the innominate venous  confluence level. This is unchanged. Stable cardiomegaly. No CHF,  edema pattern, pneumonia, collapse or consolidation. No effusion or  pneumothorax. Trachea midline. Degenerative changes of the spine  with a mild scoliosis. Postop changes in the left axilla.  IMPRESSION:  Cardiomegaly without acute chest process. No interval change.  Electronically Signed  By: Daryll Brod M.D.  On: 04/21/2013 11:21    ASSESSMENT:  1. 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 Romona Curls, sentinel node biopsy, 0.7 cm, ER positive, PR positive, HER-2/neu overexpressed. 2. Weakness, improving 3. Leukocytes in urine on 04/21/13, asymptomatic 4. Intolerance to weekly Paclitxel, last given on 04/14/2013 (cycle 2).  Causing #2.  5. Rash on dorsal aspect of hands and posterior forearm.  Patient Active Problem List   Diagnosis Date Noted  . Paroxysmal atrial fibrillation 03/17/2013  . Vitamin D deficiency 03/16/2013  . Breast cancer of upper-inner quadrant of left female breast 03/16/2013  . Atrial fibrillation   . Hypertension   . Chronic anticoagulation 11/11/2010  . Hyperlipidemia 02/26/2010     PLAN:  1. I personally reviewed and went over laboratory results with the patient. 2. I personally reviewed and went over radiographic  studies with the patient. 3. Discontinue Dexamethasone 4. Recommend Moisturizing cream/lotion to rash 5. May use OTC anti-pruritis ointment/cream for symptomatic relief 6. D/C Pacletaxel 7. Change Herceptin to 6 mg/kg every 3 weeks 8. Pre-chemo labs: CBC diff, CMET 9. Will need to start an AI in the near future, but not today due to patient's history of intolerances. 10. [facility] scan in Feb 2015 11. Return in 3 weeks for follow-up   THERAPY PLAN:  The plan was for [facility] to receive weekly Paclitaxel x 12 cycles with weekly Herceptin for her Her2 + breast cancer followed by transtion to Herceptin every 3 weeks and AI.  However, Yanelie has been intolerant to Paclitaxel and therefore we completed 2 weeks worth of weekly therapy mentioned above.  Paclitaxel was D/C'd and last given on 04/14/2013.  We will transition to Herceptin every 21 days for 52 weeks total. We will add an AI after 2 or so cycles of Herceptin to help her build confidence in tolerability of Herceptin.   All questions were answered. The patient knows to call the clinic with any problems, questions or concerns. We can certainly see the patient much sooner if necessary.  Patient and plan discussed with Dr. Farrel Gobble and he is in agreement with the aforementioned.    Shefali Ng

## 2013-05-04 ENCOUNTER — Other Ambulatory Visit (HOSPITAL_COMMUNITY): Payer: Medicare Other

## 2013-05-04 ENCOUNTER — Encounter (HOSPITAL_COMMUNITY): Payer: Self-pay | Admitting: Hematology and Oncology

## 2013-05-04 ENCOUNTER — Encounter (HOSPITAL_BASED_OUTPATIENT_CLINIC_OR_DEPARTMENT_OTHER): Payer: Medicare Other

## 2013-05-04 ENCOUNTER — Inpatient Hospital Stay (HOSPITAL_COMMUNITY): Payer: Medicare Other

## 2013-05-04 ENCOUNTER — Telehealth: Payer: Self-pay | Admitting: Adult Health

## 2013-05-04 ENCOUNTER — Encounter (HOSPITAL_COMMUNITY): Payer: Medicare Other | Attending: Hematology and Oncology | Admitting: Oncology

## 2013-05-04 VITALS — BP 125/76 | HR 63 | Temp 97.3°F | Resp 20 | Wt 151.8 lb

## 2013-05-04 DIAGNOSIS — C50212 Malignant neoplasm of upper-inner quadrant of left female breast: Secondary | ICD-10-CM

## 2013-05-04 DIAGNOSIS — R82998 Other abnormal findings in urine: Secondary | ICD-10-CM

## 2013-05-04 DIAGNOSIS — C50219 Malignant neoplasm of upper-inner quadrant of unspecified female breast: Secondary | ICD-10-CM

## 2013-05-04 DIAGNOSIS — R21 Rash and other nonspecific skin eruption: Secondary | ICD-10-CM

## 2013-05-04 DIAGNOSIS — Z5112 Encounter for antineoplastic immunotherapy: Secondary | ICD-10-CM

## 2013-05-04 DIAGNOSIS — Z09 Encounter for follow-up examination after completed treatment for conditions other than malignant neoplasm: Secondary | ICD-10-CM

## 2013-05-04 DIAGNOSIS — I4891 Unspecified atrial fibrillation: Secondary | ICD-10-CM

## 2013-05-04 DIAGNOSIS — C50919 Malignant neoplasm of unspecified site of unspecified female breast: Secondary | ICD-10-CM | POA: Insufficient documentation

## 2013-05-04 DIAGNOSIS — Z170 Estrogen receptor positive status [ER+]: Secondary | ICD-10-CM

## 2013-05-04 LAB — COMPREHENSIVE METABOLIC PANEL
ALBUMIN: 3.5 g/dL (ref 3.5–5.2)
ALT: 29 U/L (ref 0–35)
AST: 24 U/L (ref 0–37)
Alkaline Phosphatase: 48 U/L (ref 39–117)
BILIRUBIN TOTAL: 0.3 mg/dL (ref 0.3–1.2)
BUN: 14 mg/dL (ref 6–23)
CALCIUM: 8.9 mg/dL (ref 8.4–10.5)
CHLORIDE: 106 meq/L (ref 96–112)
CO2: 25 mEq/L (ref 19–32)
CREATININE: 1.04 mg/dL (ref 0.50–1.10)
GFR calc Af Amer: 60 mL/min — ABNORMAL LOW (ref >90)
GFR calc non Af Amer: 52 mL/min — ABNORMAL LOW (ref >90)
Glucose, Bld: 107 mg/dL — ABNORMAL HIGH (ref 70–99)
Potassium: 4 mEq/L (ref 3.7–5.3)
Sodium: 143 mEq/L (ref 137–147)
Total Protein: 6.2 g/dL (ref 6.0–8.3)

## 2013-05-04 LAB — DIFFERENTIAL
BASOS PCT: 1 % (ref 0–1)
Basophils Absolute: 0.1 10*3/uL (ref 0.0–0.1)
Eosinophils Absolute: 0.6 10*3/uL (ref 0.0–0.7)
Eosinophils Relative: 7 % — ABNORMAL HIGH (ref 0–5)
Lymphocytes Relative: 30 % (ref 12–46)
Lymphs Abs: 2.4 10*3/uL (ref 0.7–4.0)
Monocytes Absolute: 1 10*3/uL (ref 0.1–1.0)
Monocytes Relative: 12 % (ref 3–12)
NEUTROS PCT: 49 % (ref 43–77)
Neutro Abs: 3.9 10*3/uL (ref 1.7–7.7)

## 2013-05-04 LAB — CBC
HEMATOCRIT: 36.7 % (ref 36.0–46.0)
Hemoglobin: 11.8 g/dL — ABNORMAL LOW (ref 12.0–15.0)
MCH: 31.1 pg (ref 26.0–34.0)
MCHC: 32.2 g/dL (ref 30.0–36.0)
MCV: 96.6 fL (ref 78.0–100.0)
Platelets: 241 10*3/uL (ref 150–400)
RBC: 3.8 MIL/uL — ABNORMAL LOW (ref 3.87–5.11)
RDW: 14.9 % (ref 11.5–15.5)
WBC: 7.9 10*3/uL (ref 4.0–10.5)

## 2013-05-04 MED ORDER — HEPARIN SOD (PORK) LOCK FLUSH 100 UNIT/ML IV SOLN
INTRAVENOUS | Status: AC
  Filled 2013-05-04: qty 5

## 2013-05-04 MED ORDER — TRASTUZUMAB CHEMO INJECTION 440 MG
6.0000 mg/kg | Freq: Once | INTRAVENOUS | Status: AC
  Administered 2013-05-04: 420 mg via INTRAVENOUS
  Filled 2013-05-04: qty 20

## 2013-05-04 MED ORDER — SODIUM CHLORIDE 0.9 % IJ SOLN
10.0000 mL | INTRAMUSCULAR | Status: DC | PRN
  Administered 2013-05-04: 10 mL

## 2013-05-04 MED ORDER — DIPHENHYDRAMINE HCL 25 MG PO CAPS
50.0000 mg | ORAL_CAPSULE | Freq: Once | ORAL | Status: AC
  Administered 2013-05-04: 50 mg via ORAL

## 2013-05-04 MED ORDER — DILTIAZEM HCL ER BEADS 120 MG PO CP24: 120.0000 mg | ORAL_CAPSULE | Freq: Every morning | ORAL | Status: DC

## 2013-05-04 MED ORDER — SODIUM CHLORIDE 0.9 % IV SOLN
Freq: Once | INTRAVENOUS | Status: AC
  Administered 2013-05-04: 500 mL via INTRAVENOUS

## 2013-05-04 MED ORDER — ACETAMINOPHEN 325 MG PO TABS: 650.0000 mg | ORAL_TABLET | Freq: Once | ORAL | Status: DC

## 2013-05-04 MED ORDER — DIPHENHYDRAMINE HCL 25 MG PO CAPS
ORAL_CAPSULE | ORAL | Status: AC
  Filled 2013-05-04: qty 2

## 2013-05-04 MED ORDER — HEPARIN SOD (PORK) LOCK FLUSH 100 UNIT/ML IV SOLN
500.0000 [IU] | Freq: Once | INTRAVENOUS | Status: AC | PRN
  Administered 2013-05-04: 500 [IU]

## 2013-05-04 NOTE — Progress Notes (Signed)
Labs drawn today for cbc/diff,cmp 

## 2013-05-04 NOTE — Patient Instructions (Signed)
[facility] Discharge Instructions  RECOMMENDATIONS MADE BY THE CONSULTANT AND ANY TEST RESULTS WILL BE SENT TO YOUR REFERRING PHYSICIAN.  EXAM FINDINGS BY THE PHYSICIAN TODAY AND SIGNS OR SYMPTOMS TO REPORT TO CLINIC OR PRIMARY PHYSICIAN:   Discontinue your Dexamethasone. You will not need this anymore.   Discard your old calendar. It is no longer pertinent.   We will continue you on Herceptin every 3 weeks. That means you will not come to the [facility] but once every 3 weeks for your treatment.   This treatment should not leave you feeling tired, worn down, nauseated, etc. Please notify us if you are having any problems.     Thank you for choosing [facility] to provide your oncology and hematology care.  To afford each patient quality time with our providers, please arrive at least 15 minutes before your scheduled appointment time.  With your help, our goal is to use those 15 minutes to complete the necessary work-up to ensure our physicians have the information they need to help with your evaluation and healthcare recommendations.    Effective January 1st, 2014, we ask that you re-schedule your appointment with our physicians should you arrive 10 or more minutes late for your appointment.  We strive to give you quality time with our providers, and arriving late affects you and other patients whose appointments are after yours.    Again, thank you for choosing [facility].  Our hope is that these requests will decrease the amount of time that you wait before being seen by our physicians.       _____________________________________________________________  Should you have questions after your visit to [facility], please contact our office at [phone] between the hours of 8:30 a.m. and 5:00 p.m.  Voicemails left after 4:30 p.m. will not be returned until the following business day.  For prescription refill requests, have  your pharmacy contact our office with your prescription refill request.

## 2013-05-04 NOTE — Progress Notes (Signed)
Stacie Huang tolerated infusion well and without incident; verbalizes understanding for follow-up.  No distress noted at time of discharge and patient was discharged home with her sister.

## 2013-05-04 NOTE — Telephone Encounter (Signed)
Received fax refill request  Rx # [id number] Medication:  Diltiazem CD ER 120 mg CAP Qty 30 Sig:  Take one capsule by mouth every day Physician:  Purcell Nails

## 2013-05-04 NOTE — Telephone Encounter (Signed)
Medication sent via escribe.  

## 2013-05-09 ENCOUNTER — Ambulatory Visit (INDEPENDENT_AMBULATORY_CARE_PROVIDER_SITE_OTHER): Payer: Medicare Other | Admitting: *Deleted

## 2013-05-09 DIAGNOSIS — Z7901 Long term (current) use of anticoagulants: Secondary | ICD-10-CM

## 2013-05-09 DIAGNOSIS — Z5181 Encounter for therapeutic drug level monitoring: Secondary | ICD-10-CM

## 2013-05-09 LAB — POCT INR: INR: 2.6

## 2013-05-25 ENCOUNTER — Encounter (HOSPITAL_COMMUNITY): Payer: Self-pay

## 2013-05-25 ENCOUNTER — Encounter (HOSPITAL_BASED_OUTPATIENT_CLINIC_OR_DEPARTMENT_OTHER): Payer: Medicare Other

## 2013-05-25 VITALS — BP 135/69 | HR 75 | Temp 97.7°F | Resp 20 | Wt 151.4 lb

## 2013-05-25 DIAGNOSIS — J309 Allergic rhinitis, unspecified: Secondary | ICD-10-CM

## 2013-05-25 DIAGNOSIS — C50212 Malignant neoplasm of upper-inner quadrant of left female breast: Secondary | ICD-10-CM

## 2013-05-25 DIAGNOSIS — I4891 Unspecified atrial fibrillation: Secondary | ICD-10-CM

## 2013-05-25 DIAGNOSIS — Z5112 Encounter for antineoplastic immunotherapy: Secondary | ICD-10-CM

## 2013-05-25 DIAGNOSIS — I10 Essential (primary) hypertension: Secondary | ICD-10-CM

## 2013-05-25 DIAGNOSIS — C50219 Malignant neoplasm of upper-inner quadrant of unspecified female breast: Secondary | ICD-10-CM | POA: Diagnosis not present

## 2013-05-25 DIAGNOSIS — Z170 Estrogen receptor positive status [ER+]: Secondary | ICD-10-CM

## 2013-05-25 LAB — COMPREHENSIVE METABOLIC PANEL
ALK PHOS: 55 U/L (ref 39–117)
ALT: 16 U/L (ref 0–35)
AST: 19 U/L (ref 0–37)
Albumin: 3.9 g/dL (ref 3.5–5.2)
BUN: 14 mg/dL (ref 6–23)
CO2: 25 mEq/L (ref 19–32)
Calcium: 9.2 mg/dL (ref 8.4–10.5)
Chloride: 101 mEq/L (ref 96–112)
Creatinine, Ser: 0.91 mg/dL (ref 0.50–1.10)
GFR calc non Af Amer: 61 mL/min — ABNORMAL LOW (ref >90)
GFR, EST AFRICAN AMERICAN: 71 mL/min — AB (ref >90)
GLUCOSE: 126 mg/dL — AB (ref 70–99)
Potassium: 3.9 mEq/L (ref 3.7–5.3)
Sodium: 139 mEq/L (ref 137–147)
Total Bilirubin: 0.5 mg/dL (ref 0.3–1.2)
Total Protein: 7 g/dL (ref 6.0–8.3)

## 2013-05-25 LAB — CBC WITH DIFFERENTIAL/PLATELET
Basophils Absolute: 0.1 10*3/uL (ref 0.0–0.1)
Basophils Relative: 1 % (ref 0–1)
Eosinophils Absolute: 0.1 10*3/uL (ref 0.0–0.7)
Eosinophils Relative: 2 % (ref 0–5)
HCT: 38.3 % (ref 36.0–46.0)
Hemoglobin: 12.4 g/dL (ref 12.0–15.0)
LYMPHS ABS: 3 10*3/uL (ref 0.7–4.0)
Lymphocytes Relative: 41 % (ref 12–46)
MCH: 30.5 pg (ref 26.0–34.0)
MCHC: 32.4 g/dL (ref 30.0–36.0)
MCV: 94.1 fL (ref 78.0–100.0)
Monocytes Absolute: 0.7 10*3/uL (ref 0.1–1.0)
Monocytes Relative: 9 % (ref 3–12)
NEUTROS PCT: 47 % (ref 43–77)
Neutro Abs: 3.4 10*3/uL (ref 1.7–7.7)
PLATELETS: 285 10*3/uL (ref 150–400)
RBC: 4.07 MIL/uL (ref 3.87–5.11)
RDW: 14.4 % (ref 11.5–15.5)
WBC: 7.3 10*3/uL (ref 4.0–10.5)

## 2013-05-25 MED ORDER — ACETAMINOPHEN 325 MG PO TABS
650.0000 mg | ORAL_TABLET | Freq: Once | ORAL | Status: AC
  Administered 2013-05-25: 650 mg via ORAL
  Filled 2013-05-25: qty 2

## 2013-05-25 MED ORDER — TRASTUZUMAB CHEMO INJECTION 440 MG
6.0000 mg/kg | Freq: Once | INTRAVENOUS | Status: AC
  Administered 2013-05-25: 420 mg via INTRAVENOUS
  Filled 2013-05-25: qty 20

## 2013-05-25 MED ORDER — SODIUM CHLORIDE 0.9 % IJ SOLN: 10.0000 mL | INTRAMUSCULAR | Status: DC | PRN

## 2013-05-25 MED ORDER — HEPARIN SOD (PORK) LOCK FLUSH 100 UNIT/ML IV SOLN
INTRAVENOUS | Status: AC
  Filled 2013-05-25: qty 5

## 2013-05-25 MED ORDER — SODIUM CHLORIDE 0.9 % IV SOLN
Freq: Once | INTRAVENOUS | Status: AC
  Administered 2013-05-25: 11:00:00 via INTRAVENOUS

## 2013-05-25 MED ORDER — DIPHENHYDRAMINE HCL 25 MG PO CAPS
50.0000 mg | ORAL_CAPSULE | Freq: Once | ORAL | Status: AC
  Administered 2013-05-25: 50 mg via ORAL
  Filled 2013-05-25: qty 2

## 2013-05-25 MED ORDER — HEPARIN SOD (PORK) LOCK FLUSH 100 UNIT/ML IV SOLN
500.0000 [IU] | Freq: Once | INTRAVENOUS | Status: AC | PRN
  Administered 2013-05-25: 500 [IU]

## 2013-05-25 NOTE — Progress Notes (Signed)
[facility]  OFFICE PROGRESS NOTE  [name], MD [address] [place] [address]  DIAGNOSIS: Breast cancer of upper-inner quadrant of left female breast  Chief Complaint  Patient presents with  . HER-2 positive breast cancer    Intolerance of Taxol    CURRENT THERAPY: Herceptin intravenously every 3 weeks to complete one year of therapy which will be in December of 2015  INTERVAL HISTORY: Stacie Huang 73 y.o. female returns for continuation of Herceptin adjuvant therapy for HER-2 positive breast cancer, poor tolerance of Taxol.  She wakens in the morning with nausea and a feeling of something in her throat. She is continuing to use Flonase at bedtime and takes Zyrtec every day. She denies a diarrhea, constipation, melena, hematochezia, hematuria, PND, orthopnea, palpitations, incontinence, lower extremity swelling or redness, fever, night sweats, skin rash, joint pain, headache, or seizures. She denies any epistaxis, or vaginal bleeding.  MEDICAL HISTORY: Past Medical History  Diagnosis Date  . Hypertension   . Paroxysmal atrial fibrillation   . Chronic anticoagulation   . Hyperlipidemia   . GERD (gastroesophageal reflux disease)   . Cancer   . Breast cancer     INTERIM HISTORY: has Hyperlipidemia; Chronic anticoagulation; Atrial fibrillation; Hypertension; Vitamin D deficiency; Breast cancer of upper-inner quadrant of left female breast; and Paroxysmal atrial fibrillation on her problem list.   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 Subsequent lumpectomy and 2 sentinel lymph nodes revealed final primary tumor size of 0.7 cm with negative nodes. Plan is to treat adjuvantly with Taxol and Herceptin weekly for 12 cycles followed by every 3 week Herceptin out of one year  with anastrozole 1 mg daily given concurrently with Herceptin alone. No radiotherapy was recommended Breast cancer of upper-inner quadrant of left female breast    02/04/2013  Initial Diagnosis  Breast cancer of upper-inner quadrant of left female breast    02/23/2013  Surgery  left lumpectomy (UIQ), sentinel node biopsy--0.7cm, Node negative, ER+, PR+, HER-2/neu over expressed.    04/08/2013 - 04/14/2013  Chemotherapy  Paclitaxel/Herceptin weekly x 12. Intolerance to Paclitaxel    05/04/2013 -  Chemotherapy  Herceptin every 21 days to complete 1 year worth      ALLERGIES:  is allergic to iohexol and sulfa antibiotics.  MEDICATIONS: has a current medication list which includes the following prescription(s): atenolol, calcium carbonate, cetirizine, vitamin d, diltiazem, first-dukes mouthwash, fluticasone, lorazepam, losartan, metoclopramide, omeprazole, prochlorperazine, simvastatin, triamcinolone cream, and warfarin, and the following Facility-Administered Medications: heparin lock flush, sodium chloride, and trastuzumab (HERCEPTIN) 420 mg in sodium chloride 0.9 % 250 mL chemo infusion.  SURGICAL HISTORY:  Past Surgical History  Procedure Laterality Date  . Abdominal hysterectomy    . Cholecystectomy    . Partial mastectomy with axillary sentinel lymph node biopsy Left 02/23/2013    Procedure: LEFT PARTIAL MASTECTOMY WITH AXILLARY SIMPLE NODE BIOPSY, LEFT BREAST WIDE EXCISION;  Surgeon: Scherry Ran, MD;  Location: [facility];  Service: General;  Laterality: Left;  . Axillary lymph node dissection Left 02/23/2013    Procedure: LEFT AXILLARY LYMPH NODE DISSECTION;  Surgeon: Scherry Ran, MD;  Location: [facility];  Service: General;  Laterality: Left;  . Portacath placement Right 04/07/2013  . Portacath placement Right 04/07/2013    Procedure: INSERTION PORT-A-CATH;  Surgeon: Scherry Ran, MD;  Location: [facility];  Service: General;  Laterality: Right;    FAMILY HISTORY: family  history includes Cancer in an other family member; Heart attack in her father; Heart disease in her mother; Leukemia in an other family member.  SOCIAL HISTORY:  reports that she has never smoked. She has never used smokeless tobacco. She reports that she does not drink alcohol or use illicit drugs.  REVIEW OF SYSTEMS:  Other than that discussed above is noncontributory.  PHYSICAL EXAMINATION: ECOG PERFORMANCE STATUS: 1 - Symptomatic but completely ambulatory  Blood pressure 135/69, pulse 75, temperature 97.7 F (36.5 C), temperature source Oral, resp. rate 20, weight 151 lb 6.4 oz (68.675 kg).  GENERAL:alert, no distress and comfortable SKIN: skin color, texture, turgor are normal, no rashes or significant lesions EYES: PERLA; Conjunctiva are pink and non-injected, sclera clear OROPHARYNX:no exudate, no erythema on lips, buccal mucosa, or tongue. SINUSES: No tenderness but with evidence of rhinorrhea. No evidence of otitis. NECK: supple, thyroid normal size, non-tender, without nodularity. No masses CHEST: Left mastectomy scar well healed with no subcutaneous nodules. Right breast without mass. LYMPH:  no palpable lymphadenopathy in the cervical, axillary or inguinal LUNGS: clear to auscultation and percussion with normal breathing effort HEART: regular rate & rhythm and no murmurs. ABDOMEN:abdomen soft, non-tender and normal bowel sounds MUSCULOSKELETAL:no cyanosis of digits and no clubbing. Range of motion normal.  NEURO: alert & oriented x 3 with fluent speech, no focal motor/sensory deficits   LABORATORY DATA: Anti-coag visit on 05/09/2013  Component Date Value Range Status  . INR 05/09/2013 2.6   Final  Infusion on 05/04/2013  Component Date Value Range Status  . WBC 05/04/2013 7.9  4.0 - 10.5 K/uL Final  . RBC 05/04/2013 3.80* 3.87 - 5.11 MIL/uL Final  . Hemoglobin 05/04/2013 11.8* 12.0 - 15.0 g/dL Final  . HCT 05/04/2013 36.7  36.0 - 46.0 % Final  . MCV 05/04/2013 96.6   78.0 - 100.0 fL Final  . MCH 05/04/2013 31.1  26.0 - 34.0 pg Final  . MCHC 05/04/2013 32.2  30.0 - 36.0 g/dL Final  . RDW 05/04/2013 14.9  11.5 - 15.5 % Final  . Platelets 05/04/2013 241  150 - 400 K/uL Final  . Sodium 05/04/2013 143  137 - 147 mEq/L Final  . Potassium 05/04/2013 4.0  3.7 - 5.3 mEq/L Final  . Chloride 05/04/2013 106  96 - 112 mEq/L Final  . CO2 05/04/2013 25  19 - 32 mEq/L Final  . Glucose, Bld 05/04/2013 107* 70 - 99 mg/dL Final  . BUN 05/04/2013 14  6 - 23 mg/dL Final  . Creatinine, Ser 05/04/2013 1.04  0.50 - 1.10 mg/dL Final  . Calcium 05/04/2013 8.9  8.4 - 10.5 mg/dL Final  . Total Protein 05/04/2013 6.2  6.0 - 8.3 g/dL Final  . Albumin 05/04/2013 3.5  3.5 - 5.2 g/dL Final  . AST 05/04/2013 24  0 - 37 U/L Final  . ALT 05/04/2013 29  0 - 35 U/L Final  . Alkaline Phosphatase 05/04/2013 48  39 - 117 U/L Final  . Total Bilirubin 05/04/2013 0.3  0.3 - 1.2 mg/dL Final  . GFR calc non Af Amer 05/04/2013 52* >90 mL/min Final  . GFR calc Af Amer 05/04/2013 60* >90 mL/min Final   Comment: (NOTE)                          The eGFR has been calculated using the CKD EPI equation.  This calculation has not been validated in all clinical situations.                          eGFR's persistently <90 mL/min signify possible Chronic Kidney                          Disease.  Marland Kitchen Neutrophils Relative % 05/04/2013 49  43 - 77 % Final  . Neutro Abs 05/04/2013 3.9  1.7 - 7.7 K/uL Final  . Lymphocytes Relative 05/04/2013 30  12 - 46 % Final  . Lymphs Abs 05/04/2013 2.4  0.7 - 4.0 K/uL Final  . Monocytes Relative 05/04/2013 12  3 - 12 % Final  . Monocytes Absolute 05/04/2013 1.0  0.1 - 1.0 K/uL Final  . Eosinophils Relative 05/04/2013 7* 0 - 5 % Final  . Eosinophils Absolute 05/04/2013 0.6  0.0 - 0.7 K/uL Final  . Basophils Relative 05/04/2013 1  0 - 1 % Final  . Basophils Absolute 05/04/2013 0.1  0.0 - 0.1 K/uL Final    PATHOLOGY: No new  pathology.  Urinalysis    Component Value Date/Time   COLORURINE YELLOW 04/21/2013 [place] 04/21/2013 1044   LABSPEC 1.010 04/21/2013 1044   PHURINE 5.5 04/21/2013 1044   GLUCOSEU NEGATIVE 04/21/2013 1044   HGBUR TRACE* 04/21/2013 1044   BILIRUBINUR NEGATIVE 04/21/2013 1044   KETONESUR NEGATIVE 04/21/2013 1044   PROTEINUR NEGATIVE 04/21/2013 1044   UROBILINOGEN 0.2 04/21/2013 1044   NITRITE NEGATIVE 04/21/2013 1044   LEUKOCYTESUR TRACE* 04/21/2013 1044    RADIOGRAPHIC STUDIES: No results found.  ASSESSMENT:  #1. 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 Romona Curls, sentinel node biopsy, 0.7 cm, ER positive, PR positive, HER-2/neu overexpressed, tolerating Herceptin alone. #2. Symptomatic allergic rhinitis despite use of Flonase and Zyrtec, no suggestion of infection. #3. Paroxysmal atrial fibrillation, on long-term anticoagulation. #4. Hypertension, controlled. Marland Kitchen  PLAN:  #1. Herceptin 6 mg per kilogram intravenously today representing cycle 2 with plans to continue therapy through December 2015. #2. Suggested using a Nettie pot prior to Flonase administration each night to see whether or not morning nausea is improved. She was told to continue Zyrtec as well. #3. Office visit in 3 weeks with treatment.   All questions were answered. The patient knows to call the clinic with any problems, questions or concerns. We can certainly see the patient much sooner if necessary.   I spent 25 minutes counseling the patient face to face. The total time spent in the appointment was 30 minutes.    Doroteo Bradford, MD 05/25/2013 11:36 AM

## 2013-06-03 ENCOUNTER — Telehealth: Payer: Self-pay | Admitting: *Deleted

## 2013-06-03 NOTE — Telephone Encounter (Signed)
Spoke with pt.  Told her it would be fine for [facility] to check INR on Monday and call results to me.

## 2013-06-03 NOTE — Telephone Encounter (Signed)
Pt is calling to make sure Stacie Huang is aware shw has home health now that will be checking her coumadin. First visit scheduled for Monday 06/06/13/tmj

## 2013-06-06 ENCOUNTER — Ambulatory Visit (INDEPENDENT_AMBULATORY_CARE_PROVIDER_SITE_OTHER): Payer: Medicare Other | Admitting: *Deleted

## 2013-06-06 DIAGNOSIS — Z7901 Long term (current) use of anticoagulants: Secondary | ICD-10-CM

## 2013-06-06 LAB — POCT INR: INR: 2.1

## 2013-06-10 ENCOUNTER — Ambulatory Visit (INDEPENDENT_AMBULATORY_CARE_PROVIDER_SITE_OTHER): Payer: Medicare Other | Admitting: *Deleted

## 2013-06-10 DIAGNOSIS — Z7901 Long term (current) use of anticoagulants: Secondary | ICD-10-CM

## 2013-06-10 LAB — POCT INR: INR: 1.9

## 2013-06-13 ENCOUNTER — Encounter (HOSPITAL_COMMUNITY): Payer: Medicare Other

## 2013-06-15 ENCOUNTER — Inpatient Hospital Stay (HOSPITAL_COMMUNITY): Payer: Medicare Other

## 2013-06-17 ENCOUNTER — Encounter (HOSPITAL_COMMUNITY): Payer: Medicare Other | Attending: Hematology and Oncology

## 2013-06-17 ENCOUNTER — Ambulatory Visit (INDEPENDENT_AMBULATORY_CARE_PROVIDER_SITE_OTHER): Payer: Medicare Other | Admitting: *Deleted

## 2013-06-17 VITALS — BP 110/70 | HR 96 | Temp 98.1°F | Resp 16

## 2013-06-17 DIAGNOSIS — C50219 Malignant neoplasm of upper-inner quadrant of unspecified female breast: Secondary | ICD-10-CM | POA: Insufficient documentation

## 2013-06-17 DIAGNOSIS — I4891 Unspecified atrial fibrillation: Secondary | ICD-10-CM

## 2013-06-17 DIAGNOSIS — Z5112 Encounter for antineoplastic immunotherapy: Secondary | ICD-10-CM

## 2013-06-17 DIAGNOSIS — Z5181 Encounter for therapeutic drug level monitoring: Secondary | ICD-10-CM | POA: Insufficient documentation

## 2013-06-17 DIAGNOSIS — Z7901 Long term (current) use of anticoagulants: Secondary | ICD-10-CM

## 2013-06-17 DIAGNOSIS — C50212 Malignant neoplasm of upper-inner quadrant of left female breast: Secondary | ICD-10-CM

## 2013-06-17 LAB — COMPREHENSIVE METABOLIC PANEL
ALT: 14 U/L (ref 0–35)
AST: 25 U/L (ref 0–37)
Albumin: 3.9 g/dL (ref 3.5–5.2)
Alkaline Phosphatase: 53 U/L (ref 39–117)
BUN: 12 mg/dL (ref 6–23)
CALCIUM: 9.1 mg/dL (ref 8.4–10.5)
CO2: 25 meq/L (ref 19–32)
CREATININE: 0.89 mg/dL (ref 0.50–1.10)
Chloride: 102 mEq/L (ref 96–112)
GFR calc Af Amer: 73 mL/min — ABNORMAL LOW (ref >90)
GFR calc non Af Amer: 63 mL/min — ABNORMAL LOW (ref >90)
GLUCOSE: 102 mg/dL — AB (ref 70–99)
Potassium: 4 mEq/L (ref 3.7–5.3)
Sodium: 140 mEq/L (ref 137–147)
Total Bilirubin: 0.5 mg/dL (ref 0.3–1.2)
Total Protein: 7.2 g/dL (ref 6.0–8.3)

## 2013-06-17 LAB — CBC WITH DIFFERENTIAL/PLATELET
Basophils Absolute: 0 10*3/uL (ref 0.0–0.1)
Basophils Relative: 1 % (ref 0–1)
EOS PCT: 1 % (ref 0–5)
Eosinophils Absolute: 0.1 10*3/uL (ref 0.0–0.7)
HEMATOCRIT: 38.5 % (ref 36.0–46.0)
Hemoglobin: 12.7 g/dL (ref 12.0–15.0)
LYMPHS PCT: 30 % (ref 12–46)
Lymphs Abs: 2.6 10*3/uL (ref 0.7–4.0)
MCH: 30.5 pg (ref 26.0–34.0)
MCHC: 33 g/dL (ref 30.0–36.0)
MCV: 92.5 fL (ref 78.0–100.0)
MONO ABS: 0.7 10*3/uL (ref 0.1–1.0)
MONOS PCT: 7 % (ref 3–12)
Neutro Abs: 5.5 10*3/uL (ref 1.7–7.7)
Neutrophils Relative %: 62 % (ref 43–77)
Platelets: 291 10*3/uL (ref 150–400)
RBC: 4.16 MIL/uL (ref 3.87–5.11)
RDW: 14.3 % (ref 11.5–15.5)
WBC: 8.9 10*3/uL (ref 4.0–10.5)

## 2013-06-17 LAB — POCT INR: INR: 2

## 2013-06-17 MED ORDER — SODIUM CHLORIDE 0.9 % IV SOLN
Freq: Once | INTRAVENOUS | Status: AC
  Administered 2013-06-17: 500 mL via INTRAVENOUS

## 2013-06-17 MED ORDER — DIPHENHYDRAMINE HCL 25 MG PO CAPS
50.0000 mg | ORAL_CAPSULE | Freq: Once | ORAL | Status: AC
  Administered 2013-06-17: 50 mg via ORAL

## 2013-06-17 MED ORDER — HEPARIN SOD (PORK) LOCK FLUSH 100 UNIT/ML IV SOLN
500.0000 [IU] | Freq: Once | INTRAVENOUS | Status: AC | PRN
  Administered 2013-06-17: 500 [IU]

## 2013-06-17 MED ORDER — ACETAMINOPHEN 325 MG PO TABS
650.0000 mg | ORAL_TABLET | Freq: Once | ORAL | Status: AC
  Administered 2013-06-17: 650 mg via ORAL

## 2013-06-17 MED ORDER — SODIUM CHLORIDE 0.9 % IJ SOLN
10.0000 mL | INTRAMUSCULAR | Status: DC | PRN
  Administered 2013-06-17: 10 mL

## 2013-06-17 MED ORDER — SODIUM CHLORIDE 0.9 % IV SOLN
6.0000 mg/kg | Freq: Once | INTRAVENOUS | Status: AC
  Administered 2013-06-17: 420 mg via INTRAVENOUS
  Filled 2013-06-17: qty 20

## 2013-06-17 NOTE — Progress Notes (Signed)
Vickii Penna tolerated infusion well and without incident; per Dr. Barnet Glasgow, may proceed with Herceptin and is okay that MUGA will be done next week.  Patient verbalizes understanding for follow-up.  No distress noted at time of discharge and patient was discharged home with a friend.

## 2013-06-21 ENCOUNTER — Telehealth (HOSPITAL_COMMUNITY): Payer: Self-pay | Admitting: *Deleted

## 2013-06-21 DIAGNOSIS — C50212 Malignant neoplasm of upper-inner quadrant of left female breast: Secondary | ICD-10-CM

## 2013-06-21 MED ORDER — OMEPRAZOLE 20 MG PO CPDR: DELAYED_RELEASE_CAPSULE | ORAL | Status: DC

## 2013-06-21 NOTE — Telephone Encounter (Signed)
New prescription for Prilosec written and escribed to [facility]. [name] stated that patient shouldn't exceed 60mg  of Prilosec daily. Patient to take Prilosec 20mg  capsules. Take 2 in the am and 1 at bedtime. Patient notified and she said she understood how to take the medication.

## 2013-06-21 NOTE — Telephone Encounter (Signed)
Patient called and states that she is having a lot of belching and gas. This started last pm 06/20/13. Can patient take Prilosec 40mg  bid instead of daily?

## 2013-06-21 NOTE — Telephone Encounter (Signed)
Patient instructed to increase Prilosec 40mg  to BID per Kirby Crigler.

## 2013-06-23 ENCOUNTER — Encounter (HOSPITAL_COMMUNITY): Payer: Medicare Other

## 2013-06-23 NOTE — Progress Notes (Signed)
-  Rescheduled-  KEFALAS,THOMAS  

## 2013-06-24 ENCOUNTER — Ambulatory Visit: Payer: Medicare Other | Admitting: *Deleted

## 2013-06-24 ENCOUNTER — Ambulatory Visit (HOSPITAL_COMMUNITY): Payer: Medicare Other | Admitting: Oncology

## 2013-06-24 DIAGNOSIS — I4891 Unspecified atrial fibrillation: Secondary | ICD-10-CM

## 2013-06-24 DIAGNOSIS — Z5181 Encounter for therapeutic drug level monitoring: Secondary | ICD-10-CM

## 2013-06-24 LAB — POCT INR: INR: 2.6

## 2013-06-27 NOTE — Progress Notes (Signed)
[name], MD [address] [place] [address]  Breast cancer of upper-inner quadrant of left female breast - Plan: anastrozole (ARIMIDEX) 1 MG tablet  CURRENT THERAPY:Herceptin intravenously every 3 weeks to complete one year of therapy which will be in December of 2015.  Will start Anastrozole on 06/28/2013  INTERVAL HISTORY: TENLEIGH BYER 73 y.o. female returns for  regular  visit for followup of  Stage IA (T1b, N0, M0) invasive breast cancer, ER+ 99%, PR 13%, and Her2+, initially treated with weekly Docetaxel and Herceptin.  She received 2 cycles of this regimen with intolerance to Docetaxel leading to its discontinuation.  Now on Herceptin IV every 3 weeks which she will take for 52 week (finishing in December 2015).  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 Subsequent lumpectomy and 2 sentinel lymph nodes revealed final primary tumor size of 0.7 cm  with negative nodes. Plan is to treat adjuvantly with Taxol and Herceptin weekly for 12 cycles followed by every 3 week Herceptin out of one year with anastrozole 1 mg daily given concurrently with Herceptin alone. No radiotherapy was recommended.     Breast cancer of upper-inner quadrant of left female breast   02/04/2013 Initial Diagnosis Breast cancer of upper-inner quadrant of left female breast   02/23/2013 Surgery left lumpectomy (UIQ), sentinel node biopsy--0.7cm, Node negative, ER+, PR+, HER-2/neu over expressed.   04/08/2013 - 04/14/2013 Chemotherapy Paclitaxel/Herceptin weekly x 12. Intolerance to Paclitaxel and only receiving 2 cycles   05/04/2013 -  Chemotherapy Herceptin every 21 days to complete 1 year worth   06/28/2013 -  Chemotherapy Anastrazole started   I personally reviewed and went over laboratory results with the patient.  The results are noted within this  dictation.  The patient's only complaint is dyspepsia with burning and belching. She does not notice a correlation with food. We have maximize omeprazole was 60 mg daily. At this point time, is worthwhile to refer the patient to GI for further workup and evaluation which may include an endoscopy to evaluate for any anatomical issues. She denies any radiation of this discomfort to her jaw or down her arm. She denies any chest pressure.  Part of the patient's therapeutic intervention will include anti-endocrine therapy. She is a candidate for an aromatase inhibitor. We did not initiate this therapy immediately do to her issues with paclitaxel chemotherapy in December. We've been slowly developing her treatment plan do to her anxiety.  We discussed the risks, benefits, alternatives, and side effects of aromatase inhibitor. She was educated that this could cause hot flashes, arthralgias, myalgias, worsening of bone density.  She had a bone density examination in 2010. We will need to repeat a bone density in the near future.  Other than dyspepsia, the patient denies any complaints and ROS questioning is negative.  Past Medical History  Diagnosis Date  . Hypertension   . Paroxysmal atrial fibrillation   . Chronic anticoagulation   . Hyperlipidemia   . GERD (gastroesophageal reflux disease)   . Cancer   . Breast cancer     has Hyperlipidemia; Chronic anticoagulation; Atrial fibrillation; Hypertension; Vitamin D deficiency; Breast cancer of upper-inner quadrant of left female breast; Paroxysmal atrial fibrillation; and Encounter for therapeutic drug monitoring on her problem list.     is allergic to iohexol and sulfa antibiotics.  Ms. Winstead does not currently have medications on file.  Past Surgical History  Procedure Laterality Date  . Abdominal hysterectomy    . Cholecystectomy    . Partial mastectomy with axillary sentinel lymph node biopsy Left 02/23/2013    Procedure: LEFT PARTIAL  MASTECTOMY WITH AXILLARY SIMPLE NODE BIOPSY, LEFT BREAST WIDE EXCISION;  Surgeon: Scherry Ran, MD;  Location: [facility];  Service: General;  Laterality: Left;  . Axillary lymph node dissection Left 02/23/2013    Procedure: LEFT AXILLARY LYMPH NODE DISSECTION;  Surgeon: Scherry Ran, MD;  Location: [facility];  Service: General;  Laterality: Left;  . Portacath placement Right 04/07/2013  . Portacath placement Right 04/07/2013    Procedure: INSERTION PORT-A-CATH;  Surgeon: Scherry Ran, MD;  Location: [facility];  Service: General;  Laterality: Right;    Denies any headaches, dizziness, double vision, fevers, chills, night sweats, nausea, vomiting, diarrhea, constipation, chest pain, heart palpitations, shortness of breath, blood in stool, black tarry stool, urinary pain, urinary burning, urinary frequency, hematuria.   PHYSICAL EXAMINATION  ECOG PERFORMANCE STATUS: 1 - Symptomatic but completely ambulatory  Filed Vitals:   06/28/13 1000  BP: 151/86  Pulse: 76  Temp: 98.4 F (36.9 C)  Resp: 18    GENERAL:alert, no distress and anxious SKIN: skin color, texture, turgor are normal, no rashes or significant lesions HEAD: Normocephalic, No masses, lesions, tenderness or abnormalities EYES: normal, PERRLA, EOMI EARS: External ears normal OROPHARYNX:lips, buccal mucosa, and tongue normal and mucous membranes are moist  NECK: supple, no adenopathy, trachea midline LYMPH:  no palpable lymphadenopathy BREAST:not examined LUNGS: clear to auscultation  HEART: irregularly irregular ABDOMEN:abdomen soft, non-tender and normal bowel sounds BACK: Back symmetric, no curvature. EXTREMITIES:less then 2 second capillary refill, no joint deformities, effusion, or inflammation, no skin discoloration, no cyanosis  NEURO: alert & oriented x 3 with fluent speech, no focal motor/sensory deficits, gait normal    LABORATORY DATA: CBC    Component Value Date/Time   WBC 8.9 06/17/2013 1127    RBC 4.16 06/17/2013 1127   HGB 12.7 06/17/2013 1127   HCT 38.5 06/17/2013 1127   PLT 291 06/17/2013 1127   MCV 92.5 06/17/2013 1127   MCH 30.5 06/17/2013 1127   MCHC 33.0 06/17/2013 1127   RDW 14.3 06/17/2013 1127   LYMPHSABS 2.6 06/17/2013 1127   MONOABS 0.7 06/17/2013 1127   EOSABS 0.1 06/17/2013 1127   BASOSABS 0.0 06/17/2013 1127      Chemistry      Component Value Date/Time   NA 140 06/17/2013 1127   K 4.0 06/17/2013 1127   CL 102 06/17/2013 1127   CO2 25 06/17/2013 1127   BUN 12 06/17/2013 1127   CREATININE 0.89 06/17/2013 1127      Component Value Date/Time   CALCIUM 9.1 06/17/2013 1127   ALKPHOS 53 06/17/2013 1127   AST 25 06/17/2013 1127   ALT 14 06/17/2013 1127   BILITOT 0.5 06/17/2013 1127      ASSESSMENT:  1. Stage IA (T1b, N0, M0) invasive breast cancer, ER+ 99%, PR 13%, and Her2+, initially treated with weekly Docetaxel and Herceptin.  She received 2 cycles of this regimen with intolerance to Docetaxel leading to its discontinuation.  Now on Herceptin IV every 3 weeks which she will take for 52 week (finishing in December 2015). 2. Out-of-proportion intolerance to Docetaxel 3. Paroxysmal atrial fibrillation, on long-term anticoagulation.  4. Hypertension, controlled. 5. Dyspepsia, refer to GI  Patient Active Problem List   Diagnosis Date Noted  . Encounter for therapeutic drug monitoring 06/17/2013  .  Paroxysmal atrial fibrillation 03/17/2013  . Vitamin D deficiency 03/16/2013  . Breast cancer of upper-inner quadrant of left female breast 03/16/2013  . Atrial fibrillation   . Hypertension   . Chronic anticoagulation 11/11/2010  . Hyperlipidemia 02/26/2010    PLAN:  1. I personally reviewed and went over laboratory results with the patient.  The results are noted within this dictation. 2. I personally reviewed and went over radiographic studies with the patient.  The results are noted within this dictation.   3. Next MUGA scan is scheduled for 06/28/2013.  Then every 12  weeks. 4. Pre-chemo labs: CBC diff, CMET 5. Herceptin chemotherapy as scheduled 6. Will start Anastrozole today, 06/28/2013 1 mg daily.  7. She will need a bone density exam in the near future for baseline bone density now that she is starting AI.  This will need to be addressed in 3 weeks.  8. Referral to Dr. Gala Romney, GI, for dyspepsia despite maximum omeprazole therapy.  9. Continue omprazole 60 mg daily until seen by GI 10. Return in 3 weeks for follow-up.   THERAPY PLAN:  We will continue with Herceptin antibody therapy every 3 weeks x 52 weeks, finishing in December 2015.  She does have ER/PR positive disease and therefore anti-endocrine therapy will be started on 06/28/2013.  She will need a baseline bone density exam and this will be addressed in the next follow-up appointment.   We will continue to manage toxicities as they arise.  We will need to perform MUGA scans every 12 weeks and bone density exams every 2 years.  All questions were answered. The patient knows to call the clinic with any problems, questions or concerns. We can certainly see the patient much sooner if necessary.  Patient and plan discussed with Dr. Farrel Gobble and he is in agreement with the aforementioned.   KEFALAS,THOMAS

## 2013-06-28 ENCOUNTER — Encounter (HOSPITAL_COMMUNITY): Payer: Self-pay | Admitting: Oncology

## 2013-06-28 ENCOUNTER — Encounter (HOSPITAL_COMMUNITY): Payer: Self-pay

## 2013-06-28 ENCOUNTER — Encounter (HOSPITAL_COMMUNITY)
Admission: RE | Admit: 2013-06-28 | Discharge: 2013-06-28 | Disposition: A | Discharge status: Home / Self Care | Payer: Medicare Other | Source: Ambulatory Visit | Attending: Oncology | Admitting: Oncology

## 2013-06-28 ENCOUNTER — Encounter (HOSPITAL_COMMUNITY): Payer: Medicare Other | Attending: Hematology and Oncology | Admitting: Oncology

## 2013-06-28 VITALS — BP 151/86 | HR 76 | Temp 98.4°F | Resp 18 | Wt 146.4 lb

## 2013-06-28 DIAGNOSIS — K3189 Other diseases of stomach and duodenum: Secondary | ICD-10-CM | POA: Insufficient documentation

## 2013-06-28 DIAGNOSIS — R1013 Epigastric pain: Secondary | ICD-10-CM

## 2013-06-28 DIAGNOSIS — C50212 Malignant neoplasm of upper-inner quadrant of left female breast: Secondary | ICD-10-CM

## 2013-06-28 DIAGNOSIS — C50219 Malignant neoplasm of upper-inner quadrant of unspecified female breast: Secondary | ICD-10-CM | POA: Insufficient documentation

## 2013-06-28 DIAGNOSIS — Z170 Estrogen receptor positive status [ER+]: Secondary | ICD-10-CM

## 2013-06-28 DIAGNOSIS — Z0000 Encounter for general adult medical examination without abnormal findings: Secondary | ICD-10-CM | POA: Insufficient documentation

## 2013-06-28 DIAGNOSIS — Z09 Encounter for follow-up examination after completed treatment for conditions other than malignant neoplasm: Secondary | ICD-10-CM | POA: Insufficient documentation

## 2013-06-28 DIAGNOSIS — I4891 Unspecified atrial fibrillation: Secondary | ICD-10-CM

## 2013-06-28 MED ORDER — TECHNETIUM TC 99M-LABELED RED BLOOD CELLS IV KIT
25.0000 | PACK | Freq: Once | INTRAVENOUS | Status: AC | PRN
Start: 2013-06-28 — End: 2013-06-28
  Administered 2013-06-28: 25 via INTRAVENOUS

## 2013-06-28 MED ORDER — HEPARIN SOD (PORK) LOCK FLUSH 100 UNIT/ML IV SOLN
INTRAVENOUS | Status: AC
  Filled 2013-06-28: qty 5

## 2013-06-28 MED ORDER — ANASTROZOLE 1 MG PO TABS: 1.0000 mg | ORAL_TABLET | Freq: Every day | ORAL | Status: DC

## 2013-06-28 NOTE — Patient Instructions (Signed)
[facility] Discharge Instructions  RECOMMENDATIONS MADE BY THE CONSULTANT AND ANY TEST RESULTS WILL BE SENT TO YOUR REFERRING PHYSICIAN.  EXAM FINDINGS BY THE PHYSICIAN TODAY AND SIGNS OR SYMPTOMS TO REPORT TO CLINIC OR PRIMARY PHYSICIAN: Exam and findings as discussed by Robynn Pane, PA-C.  Will continue with chemotherapy but will add anastrozole.  Will make referral to Dr. Gala Romney.  Report fevers, chills, uncontrolled nausea or vomiting.  MEDICATIONS PRESCRIBED:  Anastrozole - take as directed  INSTRUCTIONS/FOLLOW-UP: Follow-up in 3 weeks.  Thank you for choosing [facility] to provide your oncology and hematology care.  To afford each patient quality time with our providers, please arrive at least 15 minutes before your scheduled appointment time.  With your help, our goal is to use those 15 minutes to complete the necessary work-up to ensure our physicians have the information they need to help with your evaluation and healthcare recommendations.    Effective January 1st, 2014, we ask that you re-schedule your appointment with our physicians should you arrive 10 or more minutes late for your appointment.  We strive to give you quality time with our providers, and arriving late affects you and other patients whose appointments are after yours.    Again, thank you for choosing [facility].  Our hope is that these requests will decrease the amount of time that you wait before being seen by our physicians.       _____________________________________________________________  Should you have questions after your visit to [facility], please contact our office at [phone] between the hours of 8:30 a.m. and 5:00 p.m.  Voicemails left after 4:30 p.m. will not be returned until the following business day.  For prescription refill requests, have your pharmacy contact our office with your prescription refill request.     Anastrozole  tablets What is this medicine? ANASTROZOLE (an AS troe zole) is used to treat breast cancer in women who have gone through menopause. Some types of breast cancer depend on estrogen to grow, and this medicine can stop tumor growth by blocking estrogen production. This medicine may be used for other purposes; ask your health care provider or pharmacist if you have questions. COMMON BRAND NAME(S): Arimidex What should I tell my health care provider before I take this medicine? They need to know if you have any of these conditions: -liver disease -an unusual or allergic reaction to anastrozole, other medicines, foods, dyes, or preservatives -pregnant or trying to get pregnant -breast-feeding How should I use this medicine? Take this medicine by mouth with a glass of water. Follow the directions on the prescription label. You can take this medicine with or without food. Take your doses at regular intervals. Do not take your medicine more often than directed. Do not stop taking except on the advice of your doctor or health care professional. Talk to your pediatrician regarding the use of this medicine in children. Special care may be needed. Overdosage: If you think you have taken too much of this medicine contact a poison control center or emergency room at once. NOTE: This medicine is only for you. Do not share this medicine with others. What if I miss a dose? If you miss a dose, take it as soon as you can. If it is almost time for your next dose, take only that dose. Do not take double or extra doses. What may interact with this medicine? Do not take this medicine with any of  the following medications: -female hormones, like estrogens or progestins and birth control pills This medicine may also interact with the following medications: -tamoxifen This list may not describe all possible interactions. Give your health care provider a list of all the medicines, herbs, non-prescription drugs, or  dietary supplements you use. Also tell them if you smoke, drink alcohol, or use illegal drugs. Some items may interact with your medicine. What should I watch for while using this medicine? Visit your doctor or health care professional for regular checks on your progress. Let your doctor or health care professional know about any unusual vaginal bleeding. Do not treat yourself for diarrhea, nausea, vomiting or other side effects. Ask your doctor or health care professional for advice. What side effects may I notice from receiving this medicine? Side effects that you should report to your doctor or health care professional as soon as possible: -allergic reactions like skin rash, itching or hives, swelling of the face, lips, or tongue -any new or unusual symptoms -breathing problems -chest pain -leg pain or swelling -vomiting Side effects that usually do not require medical attention (report to your doctor or health care professional if they continue or are bothersome): -back or bone pain -cough, or throat infection -diarrhea or constipation -dizziness -headache -hot flashes -loss of appetite -nausea -sweating -weakness and tiredness -weight gain This list may not describe all possible side effects. Call your doctor for medical advice about side effects. You may report side effects to FDA at 1-800-FDA-1088. Where should I keep my medicine? Keep out of the reach of children. Store at room temperature between 20 and 25 degrees C (68 and 77 degrees F). Throw away any unused medicine after the expiration date. NOTE: This sheet is a summary. It may not cover all possible information. If you have questions about this medicine, talk to your doctor, pharmacist, or health care provider.  2014, [employer]vier/Gold Standard. (2007-06-25 16:31:52)

## 2013-07-01 ENCOUNTER — Ambulatory Visit (INDEPENDENT_AMBULATORY_CARE_PROVIDER_SITE_OTHER): Payer: Medicare Other | Admitting: *Deleted

## 2013-07-01 DIAGNOSIS — Z5181 Encounter for therapeutic drug level monitoring: Secondary | ICD-10-CM

## 2013-07-01 DIAGNOSIS — I4891 Unspecified atrial fibrillation: Secondary | ICD-10-CM

## 2013-07-01 LAB — POCT INR: INR: 2.9

## 2013-07-04 ENCOUNTER — Encounter (HOSPITAL_COMMUNITY): Payer: Self-pay | Admitting: *Deleted

## 2013-07-04 NOTE — Progress Notes (Signed)
Patient started Arimidex on 07/03/13.

## 2013-07-05 ENCOUNTER — Encounter: Payer: Self-pay | Admitting: Gastroenterology

## 2013-07-06 ENCOUNTER — Encounter (HOSPITAL_BASED_OUTPATIENT_CLINIC_OR_DEPARTMENT_OTHER): Payer: Medicare Other

## 2013-07-06 ENCOUNTER — Telehealth (HOSPITAL_COMMUNITY): Payer: Self-pay | Admitting: *Deleted

## 2013-07-06 VITALS — BP 125/71 | HR 78 | Temp 98.2°F | Resp 18 | Wt 144.6 lb

## 2013-07-06 DIAGNOSIS — C50212 Malignant neoplasm of upper-inner quadrant of left female breast: Secondary | ICD-10-CM

## 2013-07-06 DIAGNOSIS — C50419 Malignant neoplasm of upper-outer quadrant of unspecified female breast: Secondary | ICD-10-CM

## 2013-07-06 DIAGNOSIS — Z5112 Encounter for antineoplastic immunotherapy: Secondary | ICD-10-CM

## 2013-07-06 DIAGNOSIS — Z170 Estrogen receptor positive status [ER+]: Secondary | ICD-10-CM

## 2013-07-06 LAB — COMPREHENSIVE METABOLIC PANEL
ALBUMIN: 3.9 g/dL (ref 3.5–5.2)
ALK PHOS: 45 U/L (ref 39–117)
ALT: 13 U/L (ref 0–35)
AST: 22 U/L (ref 0–37)
BUN: 11 mg/dL (ref 6–23)
CHLORIDE: 104 meq/L (ref 96–112)
CO2: 27 mEq/L (ref 19–32)
Calcium: 9.2 mg/dL (ref 8.4–10.5)
Creatinine, Ser: 0.79 mg/dL (ref 0.50–1.10)
GFR calc Af Amer: >90 mL/min (ref >90)
GFR calc non Af Amer: 81 mL/min — ABNORMAL LOW (ref >90)
Glucose, Bld: 96 mg/dL (ref 70–99)
POTASSIUM: 4.4 meq/L (ref 3.7–5.3)
Sodium: 141 mEq/L (ref 137–147)
Total Bilirubin: 0.5 mg/dL (ref 0.3–1.2)
Total Protein: 6.7 g/dL (ref 6.0–8.3)

## 2013-07-06 LAB — CBC WITH DIFFERENTIAL/PLATELET
BASOS ABS: 0 10*3/uL (ref 0.0–0.1)
BASOS PCT: 0 % (ref 0–1)
Eosinophils Absolute: 0 10*3/uL (ref 0.0–0.7)
Eosinophils Relative: 1 % (ref 0–5)
HCT: 37.6 % (ref 36.0–46.0)
HEMOGLOBIN: 11.9 g/dL — AB (ref 12.0–15.0)
Lymphocytes Relative: 37 % (ref 12–46)
Lymphs Abs: 2.6 10*3/uL (ref 0.7–4.0)
MCH: 29.4 pg (ref 26.0–34.0)
MCHC: 31.6 g/dL (ref 30.0–36.0)
MCV: 92.8 fL (ref 78.0–100.0)
Monocytes Absolute: 0.7 10*3/uL (ref 0.1–1.0)
Monocytes Relative: 10 % (ref 3–12)
NEUTROS ABS: 3.7 10*3/uL (ref 1.7–7.7)
Neutrophils Relative %: 52 % (ref 43–77)
Platelets: 196 10*3/uL (ref 150–400)
RBC: 4.05 MIL/uL (ref 3.87–5.11)
RDW: 14.3 % (ref 11.5–15.5)
WBC: 7.1 10*3/uL (ref 4.0–10.5)

## 2013-07-06 MED ORDER — ACETAMINOPHEN 325 MG PO TABS
650.0000 mg | ORAL_TABLET | Freq: Once | ORAL | Status: AC
  Administered 2013-07-06: 650 mg via ORAL

## 2013-07-06 MED ORDER — HEPARIN SOD (PORK) LOCK FLUSH 100 UNIT/ML IV SOLN
500.0000 [IU] | Freq: Once | INTRAVENOUS | Status: AC | PRN
  Administered 2013-07-06: 500 [IU]

## 2013-07-06 MED ORDER — SODIUM CHLORIDE 0.9 % IV SOLN
Freq: Once | INTRAVENOUS | Status: AC
  Administered 2013-07-06: 12:00:00 via INTRAVENOUS

## 2013-07-06 MED ORDER — TRASTUZUMAB CHEMO INJECTION 440 MG
6.0000 mg/kg | Freq: Once | INTRAVENOUS | Status: AC
  Administered 2013-07-06: 420 mg via INTRAVENOUS
  Filled 2013-07-06: qty 20

## 2013-07-06 MED ORDER — DIPHENHYDRAMINE HCL 25 MG PO CAPS
50.0000 mg | ORAL_CAPSULE | Freq: Once | ORAL | Status: AC
  Administered 2013-07-06: 50 mg via ORAL

## 2013-07-06 MED ORDER — ACETAMINOPHEN 325 MG PO TABS
ORAL_TABLET | ORAL | Status: AC
  Filled 2013-07-06: qty 2

## 2013-07-06 MED ORDER — DIPHENHYDRAMINE HCL 25 MG PO CAPS
ORAL_CAPSULE | ORAL | Status: AC
  Filled 2013-07-06: qty 2

## 2013-07-06 MED ORDER — SODIUM CHLORIDE 0.9 % IJ SOLN: 10.0000 mL | INTRAMUSCULAR | Status: DC | PRN

## 2013-07-06 NOTE — Patient Instructions (Signed)
Continue taking Arimidex for another week and then let's see how you are doing. If the condition worsens  call us before the one week time frame is completed.

## 2013-07-06 NOTE — Telephone Encounter (Signed)
Patient called to let us know that she is having some numbness on left side of body. She says that her left leg doesn't feel numb but it has an unusual feeling in it. She has taken 4 doses of Arimidex. I talked with [name] and he said for her to continue taking Arimidex for another week and then let's see how she is doing. I told patient that if the condition worsens to call us before the one week time frame is completed. She said ok and that she would continue taking it and then call us and let us know how she was doing.

## 2013-07-08 ENCOUNTER — Ambulatory Visit (INDEPENDENT_AMBULATORY_CARE_PROVIDER_SITE_OTHER): Payer: Medicare Other | Admitting: *Deleted

## 2013-07-08 DIAGNOSIS — I4891 Unspecified atrial fibrillation: Secondary | ICD-10-CM

## 2013-07-08 DIAGNOSIS — Z5181 Encounter for therapeutic drug level monitoring: Secondary | ICD-10-CM

## 2013-07-08 LAB — POCT INR: INR: 3.5

## 2013-07-15 ENCOUNTER — Ambulatory Visit (INDEPENDENT_AMBULATORY_CARE_PROVIDER_SITE_OTHER): Payer: Medicare Other | Admitting: *Deleted

## 2013-07-15 DIAGNOSIS — Z5181 Encounter for therapeutic drug level monitoring: Secondary | ICD-10-CM

## 2013-07-15 DIAGNOSIS — I4891 Unspecified atrial fibrillation: Secondary | ICD-10-CM

## 2013-07-15 LAB — POCT INR: INR: 2.3

## 2013-07-18 NOTE — Progress Notes (Signed)
[name], MD [address] [place] [address]  Breast cancer of upper-inner quadrant of left female breast - Plan: DG Bone Density  Preventative health care - Plan: DG Bone Density  CURRENT THERAPY: Herceptin intravenously every 3 weeks to complete one year of therapy which will be in December of 2015.  Anastrozole started on 06/28/2013   INTERVAL HISTORY: Stacie Huang 73 y.o. female returns for  regular  visit for followup of Stage IA (T1b, N0, M0) invasive breast cancer, ER+ 99%, PR 13%, and Her2+, initially treated with weekly Docetaxel and Herceptin. She received 2 cycles of this regimen with intolerance to Docetaxel leading to its discontinuation. Now on Herceptin IV every 3 weeks which she will take for 52 week (finishing in December 2015).  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 Subsequent lumpectomy and 2 sentinel lymph nodes revealed final primary tumor size of 0.7 cm  with negative nodes. Plan is to treat adjuvantly with Taxol and Herceptin weekly for 12 cycles followed by every 3 week Herceptin out of one year with anastrozole 1 mg daily given concurrently with Herceptin alone. No radiotherapy was recommended.     Breast cancer of upper-inner quadrant of left female breast   02/04/2013 Initial Diagnosis Breast cancer of upper-inner quadrant of left female breast   02/23/2013 Surgery left lumpectomy (UIQ), sentinel node biopsy--0.7cm, Node negative, ER+, PR+, HER-2/neu over expressed.   04/08/2013 - 04/14/2013 Chemotherapy Paclitaxel/Herceptin weekly x 12. Intolerance to Paclitaxel and only receiving 2 cycles   05/04/2013 -  Chemotherapy Herceptin every 21 days to complete 1 year worth   06/28/2013 -  Chemotherapy Anastrazole started   I personally reviewed and went over laboratory results with the  patient.  The results are noted within this dictation.  We discussed the risks, benefits, alternatives, and side effects of aromatase inhibitor. She was educated that this could cause hot flashes, arthralgias, myalgias, worsening of bone density. She had a bone density examination in 2010. We will need to repeat a bone density in the near future.  She notes 3 issues: 1. AM nausea.  She reports that it started about 3 weeks ago when she started Anastrozole.  She reports that she become nauseated following swallowing of Anastrozole.  She admits that her home anti-emetics resolve the nausea.  I have asked her to take her AM medications excluding anastrozole, and with them take an anti-emetic.  One hour later she is to take her anastrozole.  She is provided education regarding anastrozole and its mechanism of action to hopefully eliminate any anxiety-induced nausea.  She denies any vomiting, but admits to heaving occasionally. She is agreeable to continue with Anastrozole at this time.  2. Mild tingling in right hand fingertip.  She only received 2 cycles of weekly paclitaxel with the last one on 04/14/13.  It is is highly unlikely that this is chemotherapy-induced this far out from chemotherapy and the relatively low cumulative dose of Paclitaxel.   Additionally, this would have bilateral and possibly diffuse distribution if this was chemotherapy-induced.  On further questioning and examination, the numbness and tingling follows the median nerve distribution.  She may be developing carpal tunnel syndrome.  We will monitor for any worsening.    3. Fatigue that is extreme.  She reports that she has difficulty with household chores and inability to complete tasks due to  lack of stamina.  On questioning, she reports that she cannot say that she is worse than 3 months ago with regards to her fatigue.  I suspect this is multifactorial and unrelated to cancer and its treatment directly as she is only on Herceptin and  Anastrozole.  I question depression.  She notes that she does not go outside much.  I have encouraged her to go outside and get fresh air.  This will help with her spirits.  I recommend PCP evaluation for depression. Or other fatigue-related issues. We will monitor closely.   Oncologically, she denies any complaints and ROS questioning is negative.    Past Medical History  Diagnosis Date  . Hypertension   . Paroxysmal atrial fibrillation   . Chronic anticoagulation   . Hyperlipidemia   . GERD (gastroesophageal reflux disease)   . Cancer   . Breast cancer     has Hyperlipidemia; Chronic anticoagulation; Atrial fibrillation; Hypertension; Vitamin D deficiency; Breast cancer of upper-inner quadrant of left female breast; Paroxysmal atrial fibrillation; and Encounter for therapeutic drug monitoring on her problem list.     is allergic to iohexol and sulfa antibiotics.  Stacie Huang does not currently have medications on file.  Past Surgical History  Procedure Laterality Date  . Abdominal hysterectomy    . Cholecystectomy    . Partial mastectomy with axillary sentinel lymph node biopsy Left 02/23/2013    Procedure: LEFT PARTIAL MASTECTOMY WITH AXILLARY SIMPLE NODE BIOPSY, LEFT BREAST WIDE EXCISION;  Surgeon: Scherry Ran, MD;  Location: [facility];  Service: General;  Laterality: Left;  . Axillary lymph node dissection Left 02/23/2013    Procedure: LEFT AXILLARY LYMPH NODE DISSECTION;  Surgeon: Scherry Ran, MD;  Location: [facility];  Service: General;  Laterality: Left;  . Portacath placement Right 04/07/2013  . Portacath placement Right 04/07/2013    Procedure: INSERTION PORT-A-CATH;  Surgeon: Scherry Ran, MD;  Location: [facility];  Service: General;  Laterality: Right;    Denies any headaches, dizziness, double vision, fevers, chills, night sweats, nausea, vomiting, diarrhea, constipation, chest pain, heart palpitations, shortness of breath, blood in stool, black tarry stool,  urinary pain, urinary burning, urinary frequency, hematuria.   PHYSICAL EXAMINATION  ECOG PERFORMANCE STATUS: 2 - Symptomatic, <50% confined to bed  Filed Vitals:   07/19/13 0938  BP: 134/76  Pulse: 67  Temp: 97.5 F (36.4 C)  Resp: 20    GENERAL:alert, no distress, well nourished, well developed, anxious, comfortable, cooperative and smiling SKIN: skin color, texture, turgor are normal, no rashes or significant lesions HEAD: Normocephalic, No masses, lesions, tenderness or abnormalities, hair is growing back EYES: normal, PERRLA, EOMI, Conjunctiva are pink and non-injected EARS: External ears normal OROPHARYNX:mucous membranes are moist  NECK: supple, no adenopathy, thyroid normal size, non-tender, without nodularity, no stridor, non-tender, trachea midline LYMPH:  not examined BREAST:not examined LUNGS: clear to auscultation  HEART: regular rate & rhythm, no murmurs and no gallops ABDOMEN:abdomen soft, non-tender and normal bowel sounds BACK: Back symmetric, no curvature. EXTREMITIES:less then 2 second capillary refill, no joint deformities, effusion, or inflammation, no edema, no skin discoloration, no clubbing, no cyanosis  NEURO: alert & oriented x 3 with fluent speech, no focal motor/sensory deficits, gait normal    LABORATORY DATA: CBC    Component Value Date/Time   WBC 7.1 07/06/2013 1121   RBC 4.05 07/06/2013 1121   HGB 11.9* 07/06/2013 1121   HCT 37.6 07/06/2013 1121   PLT 196 07/06/2013 1121   MCV  92.8 07/06/2013 1121   MCH 29.4 07/06/2013 1121   MCHC 31.6 07/06/2013 1121   RDW 14.3 07/06/2013 1121   LYMPHSABS 2.6 07/06/2013 1121   MONOABS 0.7 07/06/2013 1121   EOSABS 0.0 07/06/2013 1121   BASOSABS 0.0 07/06/2013 1121      Chemistry      Component Value Date/Time   NA 141 07/06/2013 1121   K 4.4 07/06/2013 1121   CL 104 07/06/2013 1121   CO2 27 07/06/2013 1121   BUN 11 07/06/2013 1121   CREATININE 0.79 07/06/2013 1121      Component Value Date/Time   CALCIUM 9.2  07/06/2013 1121   ALKPHOS 45 07/06/2013 1121   AST 22 07/06/2013 1121   ALT 13 07/06/2013 1121   BILITOT 0.5 07/06/2013 1121        ASSESSMENT:  1. Stage IA (T1b, N0, M0) invasive breast cancer, ER+ 99%, PR 13%, and Her2+, initially treated with weekly Docetaxel and Herceptin. She received 2 cycles of this regimen with intolerance to Docetaxel leading to its discontinuation. Now on Herceptin IV every 3 weeks which she will take for 52 week (finishing in December 2015).  2. Out-of-proportion intolerance to Docetaxel  3. Paroxysmal atrial fibrillation, on long-term anticoagulation.  4. Hypertension, controlled.  5. Dyspepsia, GI appt with Neil Crouch on 4/16 6. AM nausea 7. Right fingertips neuropthy, following median nerve distribution.  Unlikely chemotherapy-induced 8. Fatigue, "Extreme".  I suspect she is at baseline.  Suspect multifactorial with component of depression.  Patient Active Problem List   Diagnosis Date Noted  . Encounter for therapeutic drug monitoring 06/17/2013  . Paroxysmal atrial fibrillation 03/17/2013  . Vitamin D deficiency 03/16/2013  . Breast cancer of upper-inner quadrant of left female breast 03/16/2013  . Atrial fibrillation   . Hypertension   . Chronic anticoagulation 11/11/2010  . Hyperlipidemia 02/26/2010    PLAN:  1. I personally reviewed and went over laboratory results with the patient.  The results are noted within this dictation. 2. I personally reviewed and went over radiographic studies with the patient.  The results are noted within this dictation.   3. Next MUGA scan is scheduled for 5/26 and every 12 weeks. 4. Appointment with GI, Neil Crouch, is scheduled for 4/16 5. Pre-chemo labs: CBC diff, CMET 6. Herceptin infusion as scheduled 7. Bone density within the next 3 weeks 8. Take home anti-emetics in AM 1 hour prior to Anastrozole consumption 9. Patient education regarding carpal tunnel.   10. Education regarding fatigue.  Recommended  patient go outside on nice days to enjoy nature.  She may benefit from Chaplain referral which I will need to discuss with her in the future. 11. Return in 3 weeks for follow-up.   THERAPY PLAN:  She will continue with Anastrozole and Herceptin as scheduled.   All questions were answered. The patient knows to call the clinic with any problems, questions or concerns. We can certainly see the patient much sooner if necessary.  Patient and plan discussed with Dr. Farrel Gobble and he is in agreement with the aforementioned.   KEFALAS,THOMAS 07/19/2013

## 2013-07-19 ENCOUNTER — Encounter (HOSPITAL_COMMUNITY): Payer: Self-pay | Admitting: Oncology

## 2013-07-19 ENCOUNTER — Encounter (HOSPITAL_BASED_OUTPATIENT_CLINIC_OR_DEPARTMENT_OTHER): Payer: Medicare Other | Admitting: Oncology

## 2013-07-19 VITALS — BP 134/76 | HR 67 | Temp 97.5°F | Resp 20 | Wt 143.5 lb

## 2013-07-19 DIAGNOSIS — Z170 Estrogen receptor positive status [ER+]: Secondary | ICD-10-CM

## 2013-07-19 DIAGNOSIS — G589 Mononeuropathy, unspecified: Secondary | ICD-10-CM

## 2013-07-19 DIAGNOSIS — C50219 Malignant neoplasm of upper-inner quadrant of unspecified female breast: Secondary | ICD-10-CM

## 2013-07-19 DIAGNOSIS — R5383 Other fatigue: Secondary | ICD-10-CM

## 2013-07-19 DIAGNOSIS — R5381 Other malaise: Secondary | ICD-10-CM

## 2013-07-19 DIAGNOSIS — Z0000 Encounter for general adult medical examination without abnormal findings: Secondary | ICD-10-CM

## 2013-07-19 DIAGNOSIS — C50212 Malignant neoplasm of upper-inner quadrant of left female breast: Secondary | ICD-10-CM

## 2013-07-19 NOTE — Patient Instructions (Signed)
[facility] Discharge Instructions  RECOMMENDATIONS MADE BY THE CONSULTANT AND ANY TEST RESULTS WILL BE SENT TO YOUR REFERRING PHYSICIAN.  MEDICATIONS PRESCRIBED:  None  INSTRUCTIONS GIVEN AND DISCUSSED: Take pills in the AM with your Metamucil.  Take your anti-nausea medication with your Metamucil.  Wait 1 hour and then take your Arimidex (chemo pill) about 1 hour later.  Let us know if that helps with your nausea in the AM.  SPECIAL INSTRUCTIONS/FOLLOW-UP: Bone density exam within the next 3 weeks. MUGA scan as scheduled in May Appointment with Neil Crouch (GI) on 4/16 as scheduled.  Follow-up in 3 weeks for follow-up. Please call the clinic with any questions or concerns   Thank you for choosing [facility] to provide your oncology and hematology care.  To afford each patient quality time with our providers, please arrive at least 15 minutes before your scheduled appointment time.  With your help, our goal is to use those 15 minutes to complete the necessary work-up to ensure our physicians have the information they need to help with your evaluation and healthcare recommendations.    Effective January 1st, 2014, we ask that you re-schedule your appointment with our physicians should you arrive 10 or more minutes late for your appointment.  We strive to give you quality time with our providers, and arriving late affects you and other patients whose appointments are after yours.    Again, thank you for choosing [facility].  Our hope is that these requests will decrease the amount of time that you wait before being seen by our physicians.       _____________________________________________________________  Should you have questions after your visit to [facility], please contact our office at [phone] between the hours of 8:30 a.m. and 5:00 p.m.  Voicemails left after 4:30 p.m. will not be returned until the following  business day.  For prescription refill requests, have your pharmacy contact our office with your prescription refill request.

## 2013-07-22 ENCOUNTER — Ambulatory Visit (INDEPENDENT_AMBULATORY_CARE_PROVIDER_SITE_OTHER): Payer: Medicare Other | Admitting: *Deleted

## 2013-07-22 DIAGNOSIS — I4891 Unspecified atrial fibrillation: Secondary | ICD-10-CM

## 2013-07-22 DIAGNOSIS — Z5181 Encounter for therapeutic drug level monitoring: Secondary | ICD-10-CM

## 2013-07-22 LAB — POCT INR: INR: 2

## 2013-07-26 ENCOUNTER — Ambulatory Visit: Payer: Medicare Other | Admitting: Gastroenterology

## 2013-07-27 ENCOUNTER — Encounter (HOSPITAL_COMMUNITY): Payer: Medicare Other | Attending: Hematology and Oncology

## 2013-07-27 ENCOUNTER — Encounter (HOSPITAL_BASED_OUTPATIENT_CLINIC_OR_DEPARTMENT_OTHER): Payer: Medicare Other | Admitting: Oncology

## 2013-07-27 ENCOUNTER — Encounter (HOSPITAL_COMMUNITY): Payer: Self-pay

## 2013-07-27 VITALS — BP 141/83 | HR 97 | Temp 97.1°F | Resp 20 | Wt 143.4 lb

## 2013-07-27 DIAGNOSIS — Z79811 Long term (current) use of aromatase inhibitors: Secondary | ICD-10-CM

## 2013-07-27 DIAGNOSIS — K3189 Other diseases of stomach and duodenum: Secondary | ICD-10-CM

## 2013-07-27 DIAGNOSIS — C50219 Malignant neoplasm of upper-inner quadrant of unspecified female breast: Secondary | ICD-10-CM

## 2013-07-27 DIAGNOSIS — R1013 Epigastric pain: Secondary | ICD-10-CM

## 2013-07-27 DIAGNOSIS — Z170 Estrogen receptor positive status [ER+]: Secondary | ICD-10-CM

## 2013-07-27 DIAGNOSIS — C50212 Malignant neoplasm of upper-inner quadrant of left female breast: Secondary | ICD-10-CM

## 2013-07-27 DIAGNOSIS — Z5112 Encounter for antineoplastic immunotherapy: Secondary | ICD-10-CM

## 2013-07-27 LAB — COMPREHENSIVE METABOLIC PANEL
ALBUMIN: 4 g/dL (ref 3.5–5.2)
ALK PHOS: 48 U/L (ref 39–117)
ALT: 13 U/L (ref 0–35)
AST: 18 U/L (ref 0–37)
BILIRUBIN TOTAL: 0.6 mg/dL (ref 0.3–1.2)
BUN: 9 mg/dL (ref 6–23)
CHLORIDE: 104 meq/L (ref 96–112)
CO2: 26 mEq/L (ref 19–32)
Calcium: 9.3 mg/dL (ref 8.4–10.5)
Creatinine, Ser: 0.82 mg/dL (ref 0.50–1.10)
GFR calc Af Amer: 80 mL/min — ABNORMAL LOW (ref >90)
GFR calc non Af Amer: 69 mL/min — ABNORMAL LOW (ref >90)
Glucose, Bld: 95 mg/dL (ref 70–99)
POTASSIUM: 4.2 meq/L (ref 3.7–5.3)
Sodium: 142 mEq/L (ref 137–147)
TOTAL PROTEIN: 7.1 g/dL (ref 6.0–8.3)

## 2013-07-27 LAB — CBC WITH DIFFERENTIAL/PLATELET
BASOS ABS: 0 10*3/uL (ref 0.0–0.1)
BASOS PCT: 0 % (ref 0–1)
EOS ABS: 0 10*3/uL (ref 0.0–0.7)
Eosinophils Relative: 1 % (ref 0–5)
HCT: 39 % (ref 36.0–46.0)
Hemoglobin: 12.4 g/dL (ref 12.0–15.0)
Lymphocytes Relative: 35 % (ref 12–46)
Lymphs Abs: 2.6 10*3/uL (ref 0.7–4.0)
MCH: 29.5 pg (ref 26.0–34.0)
MCHC: 31.8 g/dL (ref 30.0–36.0)
MCV: 92.9 fL (ref 78.0–100.0)
Monocytes Absolute: 0.7 10*3/uL (ref 0.1–1.0)
Monocytes Relative: 9 % (ref 3–12)
NEUTROS ABS: 4.2 10*3/uL (ref 1.7–7.7)
Neutrophils Relative %: 55 % (ref 43–77)
PLATELETS: 253 10*3/uL (ref 150–400)
RBC: 4.2 MIL/uL (ref 3.87–5.11)
RDW: 13.9 % (ref 11.5–15.5)
WBC: 7.6 10*3/uL (ref 4.0–10.5)

## 2013-07-27 MED ORDER — DIPHENHYDRAMINE HCL 25 MG PO CAPS
ORAL_CAPSULE | ORAL | Status: AC
  Filled 2013-07-27: qty 1

## 2013-07-27 MED ORDER — DIPHENHYDRAMINE HCL 25 MG PO CAPS
50.0000 mg | ORAL_CAPSULE | Freq: Once | ORAL | Status: AC
  Administered 2013-07-27: 50 mg via ORAL

## 2013-07-27 MED ORDER — TRASTUZUMAB CHEMO INJECTION 440 MG
6.0000 mg/kg | Freq: Once | INTRAVENOUS | Status: AC
  Administered 2013-07-27: 420 mg via INTRAVENOUS
  Filled 2013-07-27: qty 20

## 2013-07-27 MED ORDER — SODIUM CHLORIDE 0.9 % IJ SOLN: 10.0000 mL | INTRAMUSCULAR | Status: DC | PRN

## 2013-07-27 MED ORDER — ACETAMINOPHEN 325 MG PO TABS
650.0000 mg | ORAL_TABLET | Freq: Once | ORAL | Status: AC
  Administered 2013-07-27: 650 mg via ORAL

## 2013-07-27 MED ORDER — SODIUM CHLORIDE 0.9 % IV SOLN
Freq: Once | INTRAVENOUS | Status: AC
  Administered 2013-07-27: 12:00:00 via INTRAVENOUS

## 2013-07-27 MED ORDER — HEPARIN SOD (PORK) LOCK FLUSH 100 UNIT/ML IV SOLN
500.0000 [IU] | Freq: Once | INTRAVENOUS | Status: AC | PRN
  Administered 2013-07-27: 500 [IU]
  Filled 2013-07-27: qty 5

## 2013-07-27 MED ORDER — ACETAMINOPHEN 325 MG PO TABS
ORAL_TABLET | ORAL | Status: AC
  Filled 2013-07-27: qty 2

## 2013-07-27 NOTE — Progress Notes (Signed)
[name], MD [address] [place] [address]  Breast cancer of upper-inner quadrant of left female breast  CURRENT THERAPY:Herceptin intravenously every 3 weeks to complete one year of therapy which will be in December of 2015. Anastrozole started on 06/28/2013  INTERVAL HISTORY: Stacie Huang 73 y.o. female returns for  regular  visit for followup of Stage IA (T1b, N0, M0) invasive breast cancer, ER+ 99%, PR 13%, and Her2+, initially treated with weekly Docetaxel and Herceptin. She received 2 cycles of this regimen with intolerance to Docetaxel leading to its discontinuation. Now on Herceptin IV every 3 weeks which she will take for 52 week (finishing in December 2015). Anastrozole started on 06/28/2013.     Breast cancer of upper-inner quadrant of left female breast   02/04/2013 Initial Diagnosis Breast cancer of upper-inner quadrant of left female breast   02/23/2013 Surgery left lumpectomy (UIQ), sentinel node biopsy--0.7cm, Node negative, ER+, PR+, HER-2/neu over expressed.   04/08/2013 - 04/14/2013 Chemotherapy Paclitaxel/Herceptin weekly x 12. Intolerance to Paclitaxel and only receiving 2 cycles   05/04/2013 -  Chemotherapy Herceptin every 21 days to complete 1 year worth   06/28/2013 -  Chemotherapy Anastrazole started    I personally reviewed and went over laboratory results with the patient.  The results are noted within this dictation.  After a difficult start with Anastrozole, she is now tolerating the medication much better.  He nausea and vomiting is improved she notes.  She is encouraged to continue with the medication.   Oncologically, she denies any complaints and ROS questioning is negative.     Past Medical History  Diagnosis Date  . Hypertension   . Paroxysmal atrial fibrillation   . Chronic anticoagulation   . Hyperlipidemia   . GERD (gastroesophageal reflux disease)   . Cancer   . Breast cancer     has Hyperlipidemia; Chronic  anticoagulation; Atrial fibrillation; Hypertension; Vitamin D deficiency; Breast cancer of upper-inner quadrant of left female breast; Paroxysmal atrial fibrillation; and Encounter for therapeutic drug monitoring on her problem list.     is allergic to iohexol and sulfa antibiotics.  Ms. Bugge does not currently have medications on file.  Past Surgical History  Procedure Laterality Date  . Abdominal hysterectomy    . Cholecystectomy    . Partial mastectomy with axillary sentinel lymph node biopsy Left 02/23/2013    Procedure: LEFT PARTIAL MASTECTOMY WITH AXILLARY SIMPLE NODE BIOPSY, LEFT BREAST WIDE EXCISION;  Surgeon: Scherry Ran, MD;  Location: [facility];  Service: General;  Laterality: Left;  . Axillary lymph node dissection Left 02/23/2013    Procedure: LEFT AXILLARY LYMPH NODE DISSECTION;  Surgeon: Scherry Ran, MD;  Location: [facility];  Service: General;  Laterality: Left;  . Portacath placement Right 04/07/2013  . Portacath placement Right 04/07/2013    Procedure: INSERTION PORT-A-CATH;  Surgeon: Scherry Ran, MD;  Location: [facility];  Service: General;  Laterality: Right;    Denies any headaches, dizziness, double vision, fevers, chills, night sweats, nausea, vomiting, diarrhea, constipation, chest pain, heart palpitations, shortness of breath, blood in stool, black tarry stool, urinary pain, urinary burning, urinary frequency, hematuria.   PHYSICAL EXAMINATION  ECOG PERFORMANCE STATUS: 1 - Symptomatic but completely ambulatory  There were no vitals filed for this visit.  GENERAL:alert, no distress, well nourished, well developed, anxious, comfortable, cooperative and smiling SKIN: skin color, texture, turgor are normal, no rashes or significant lesions HEAD: Normocephalic, No masses, lesions, tenderness or abnormalities  EYES: normal, PERRLA, EOMI, Conjunctiva are pink and non-injected EARS: External ears normal OROPHARYNX:mucous membranes are moist  NECK: supple,  trachea midline LYMPH:  not examined BREAST:not examined LUNGS: not examined HEART: not examined ABDOMEN:not examined BACK: Back symmetric, no curvature. EXTREMITIES:less then 2 second capillary refill, no skin discoloration  NEURO: alert & oriented x 3 with fluent speech, no focal motor/sensory deficits, gait normal   LABORATORY DATA: CBC    Component Value Date/Time   WBC 7.6 07/27/2013 1114   RBC 4.20 07/27/2013 1114   HGB 12.4 07/27/2013 1114   HCT 39.0 07/27/2013 1114   PLT 253 07/27/2013 1114   MCV 92.9 07/27/2013 1114   MCH 29.5 07/27/2013 1114   MCHC 31.8 07/27/2013 1114   RDW 13.9 07/27/2013 1114   LYMPHSABS 2.6 07/27/2013 1114   MONOABS 0.7 07/27/2013 1114   EOSABS 0.0 07/27/2013 1114   BASOSABS 0.0 07/27/2013 1114      Chemistry      Component Value Date/Time   NA 142 07/27/2013 1114   K 4.2 07/27/2013 1114   CL 104 07/27/2013 1114   CO2 26 07/27/2013 1114   BUN 9 07/27/2013 1114   CREATININE 0.82 07/27/2013 1114      Component Value Date/Time   CALCIUM 9.3 07/27/2013 1114   ALKPHOS 48 07/27/2013 1114   AST 18 07/27/2013 1114   ALT 13 07/27/2013 1114   BILITOT 0.6 07/27/2013 1114        ASSESSMENT:  1. Stage IA (T1b, N0, M0) invasive breast cancer, ER+ 99%, PR 13%, and Her2+, initially treated with weekly Docetaxel and Herceptin. She received 2 cycles of this regimen with intolerance to Docetaxel leading to its discontinuation. Now on Herceptin IV every 3 weeks which she will take for 52 week (finishing in December 2015).  2. Out-of-proportion intolerance to Docetaxel  3. Paroxysmal atrial fibrillation, on long-term anticoagulation.  4. Hypertension, controlled.  5. Dyspepsia, GI appt with Neil Crouch on 4/16  6. AM nausea, improved 7. 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 Fatigue, "Extreme". I suspect she is at baseline. Suspect multifactorial with component of depression.  Patient Active Problem List   Diagnosis Date Noted  .  Encounter for therapeutic drug monitoring 06/17/2013  . Paroxysmal atrial fibrillation 03/17/2013  . Vitamin D deficiency 03/16/2013  . Breast cancer of upper-inner quadrant of left female breast 03/16/2013  . Atrial fibrillation   . Hypertension   . Chronic anticoagulation 11/11/2010  . Hyperlipidemia 02/26/2010     PLAN:  1. I personally reviewed and went over laboratory results with the patient.  The results are noted within this dictation. 2. Continue with Herceptin every 21 days as scheduled 3. Continue with Arimidex as planned 4. Pre-chemo labs as scheduled: CBC diff, CMET 5. Follow-up with Neil Crouch (GI) as scheduled on 4/16. 6. Bone density (baseline) as scheduled on 08/02/2013 7. MUGA scan as scheduled in May 2015. 8. Return in 6 weeks for follow-up.   THERAPY PLAN:  Stacie Huang is tolerating Anastrozole better now that she has taken the medication for nearly 1 month.  I have encouraged her to continue with the medication and inform us of any issues associated with the medication.  She will continue with Herceptin every 21 days.  Tolerability of this regimen should be good for her.  We will manage any oncology issues, including side effects of medications if they arise.   All questions were answered. The patient knows to call the clinic with any  problems, questions or concerns. We can certainly see the patient much sooner if necessary.  Patient and plan discussed with Dr. Farrel Gobble and he is in agreement with the aforementioned.   KEFALAS,THOMAS 07/27/2013

## 2013-07-27 NOTE — Patient Instructions (Signed)
[facility] Discharge Instructions  RECOMMENDATIONS MADE BY THE CONSULTANT AND ANY TEST RESULTS WILL BE SENT TO YOUR REFERRING PHYSICIAN.  MEDICATIONS PRESCRIBED:  None  INSTRUCTIONS GIVEN AND DISCUSSED: Follow-up with Neil Crouch (GI) as scheduled  SPECIAL INSTRUCTIONS/FOLLOW-UP: Continue with Arimidex daily.  Please call your pharmacy when you need a refill of the medication. Return for Herceptin therapy every 3 weeks as scheduled. Next office visit in 6 weeks.  Thank you for choosing [facility] to provide your oncology and hematology care.  To afford each patient quality time with our providers, please arrive at least 15 minutes before your scheduled appointment time.  With your help, our goal is to use those 15 minutes to complete the necessary work-up to ensure our physicians have the information they need to help with your evaluation and healthcare recommendations.    Effective January 1st, 2014, we ask that you re-schedule your appointment with our physicians should you arrive 10 or more minutes late for your appointment.  We strive to give you quality time with our providers, and arriving late affects you and other patients whose appointments are after yours.    Again, thank you for choosing [facility].  Our hope is that these requests will decrease the amount of time that you wait before being seen by our physicians.       _____________________________________________________________  Should you have questions after your visit to [facility], please contact our office at [phone] between the hours of 8:30 a.m. and 5:00 p.m.  Voicemails left after 4:30 p.m. will not be returned until the following business day.  For prescription refill requests, have your pharmacy contact our office with your prescription refill request.

## 2013-07-27 NOTE — Progress Notes (Signed)
Venipuncture performed with a 23 gauge butterfly needle to R Antecubital.  Stacie Huang tolerated procedure well and without incident; questions were answered and patient was discharged.  Stacie Huang' Tolerated chemotherapy well today without incident

## 2013-08-02 ENCOUNTER — Ambulatory Visit (HOSPITAL_COMMUNITY)
Admission: RE | Admit: 2013-08-02 | Discharge: 2013-08-02 | Disposition: A | Discharge status: Home / Self Care | Payer: Medicare Other | Source: Ambulatory Visit | Attending: Oncology | Admitting: Oncology

## 2013-08-02 DIAGNOSIS — IMO0002 Reserved for concepts with insufficient information to code with codable children: Secondary | ICD-10-CM | POA: Diagnosis not present

## 2013-08-02 DIAGNOSIS — C50219 Malignant neoplasm of upper-inner quadrant of unspecified female breast: Secondary | ICD-10-CM | POA: Diagnosis present

## 2013-08-02 DIAGNOSIS — E559 Vitamin D deficiency, unspecified: Secondary | ICD-10-CM | POA: Insufficient documentation

## 2013-08-02 DIAGNOSIS — Z0000 Encounter for general adult medical examination without abnormal findings: Secondary | ICD-10-CM

## 2013-08-02 DIAGNOSIS — C50212 Malignant neoplasm of upper-inner quadrant of left female breast: Secondary | ICD-10-CM

## 2013-08-08 NOTE — Progress Notes (Signed)
Stacie Bellow, MD [address] [place] [address]  Breast cancer of upper-inner quadrant of left female breast - Plan: NM Cardiac [place] [facility], anastrozole (ARIMIDEX) 1 MG tablet  CURRENT THERAPY: :Herceptin intravenously every 3 weeks to complete one year of therapy which will be in December of 2015. Anastrozole started on 06/28/2013  INTERVAL HISTORY: Stacie Huang 73 y.o. female returns for  regular  visit for followup of Stage IA (T1b, N0, M0) invasive breast cancer, ER+ 99%, PR 13%, and Her2+, initially treated with weekly Docetaxel and Herceptin. She received 2 cycles of this regimen with intolerance to Docetaxel leading to its discontinuation. Now on Herceptin IV every 3 weeks which she will take for 52 week (finishing in December 2015). Anastrozole started on 06/28/2013.     Breast cancer of upper-inner quadrant of left female breast   02/04/2013 Initial Diagnosis Breast cancer of upper-inner quadrant of left female breast   02/23/2013 Surgery left lumpectomy (UIQ), sentinel node biopsy--0.7cm, Node negative, ER+, PR+, HER-2/neu over expressed.   04/08/2013 - 04/14/2013 Chemotherapy Paclitaxel/Herceptin weekly x 12. Intolerance to Paclitaxel and only receiving 2 cycles   05/04/2013 -  Chemotherapy Herceptin every 21 days to complete 1 year worth   06/28/2013 -  Chemotherapy Anastrazole started    I personally reviewed and went over laboratory results with the patient.  The results are noted within this dictation.  I personally reviewed and went over radiographic studies with the patient.  The results are noted within this dictation.  Her bone density exam was WNL on 08/03/2013.  She will be due for a repeat in April 2017.  She is to continue with calcium and Vitamin D.  She reports that she is tolerating Arimidex well without any noticeable complaints.  She is educated regarding the side effects of this medication.  Oncologically, she denies any complaints and ROS  questioning is negative.   Past Medical History  Diagnosis Date  . Hypertension   . Paroxysmal atrial fibrillation   . Chronic anticoagulation   . Hyperlipidemia   . GERD (gastroesophageal reflux disease)   . Cancer   . Breast cancer     has Hyperlipidemia; Chronic anticoagulation; Atrial fibrillation; Hypertension; Vitamin D deficiency; Breast cancer of upper-inner quadrant of left female breast; Paroxysmal atrial fibrillation; and Encounter for therapeutic drug monitoring on her problem list.     is allergic to iohexol and sulfa antibiotics.  Ms. Dullea had no medications administered during this visit.  Past Surgical History  Procedure Laterality Date  . Abdominal hysterectomy    . Cholecystectomy    . Partial mastectomy with axillary sentinel lymph node biopsy Left 02/23/2013    Procedure: LEFT PARTIAL MASTECTOMY WITH AXILLARY SIMPLE NODE BIOPSY, LEFT BREAST WIDE EXCISION;  Surgeon: Scherry Ran, MD;  Location: [facility];  Service: General;  Laterality: Left;  . Axillary lymph node dissection Left 02/23/2013    Procedure: LEFT AXILLARY LYMPH NODE DISSECTION;  Surgeon: Scherry Ran, MD;  Location: [facility];  Service: General;  Laterality: Left;  . Portacath placement Right 04/07/2013  . Portacath placement Right 04/07/2013    Procedure: INSERTION PORT-A-CATH;  Surgeon: Scherry Ran, MD;  Location: [facility];  Service: General;  Laterality: Right;  . Esophagogastroduodenoscopy  03/10/2002    QPR:FFMBWG esophagus/small HH/Tiny AVM in antrum, otherwise normal stomach and D1 and D2/Status post passage of the [facility] dilator  . Colonoscopy  03/10/2002    RMR: Incomplete colonoscopy (sigmoidoscopy)/Normal rectum/Normal colon to 40 cm.  Denies any headaches, dizziness, double vision, fevers, chills, night sweats, nausea, vomiting, diarrhea, constipation, chest pain, heart palpitations, shortness of breath, blood in stool, black tarry stool, urinary pain, urinary burning,  urinary frequency, hematuria.   PHYSICAL EXAMINATION  ECOG PERFORMANCE STATUS: 0 - Asymptomatic  Filed Vitals:   08/10/13 1100  BP: 148/79  Pulse: 67  Temp: 97.9 F (36.6 C)  Resp: 18    GENERAL:alert, no distress, well nourished, well developed, comfortable and cooperative SKIN: skin color, texture, turgor are normal, no rashes or significant lesions HEAD: Normocephalic, No masses, lesions, tenderness or abnormalities EYES: normal, PERRLA, EOMI, Conjunctiva are pink and non-injected EARS: External ears normal OROPHARYNX:mucous membranes are moist  NECK: supple, trachea midline LYMPH:  not examined BREAST:not examined LUNGS: clear to auscultation  HEART: no murmurs and irregularly irregular ABDOMEN:not examined BACK: Back symmetric, no curvature. EXTREMITIES:no skin discoloration, no cyanosis  NEURO: alert & oriented x 3 with fluent speech, no focal motor/sensory deficits, gait normal   LABORATORY DATA: CBC    Component Value Date/Time   WBC 7.6 07/27/2013 1114   RBC 4.20 07/27/2013 1114   HGB 12.4 07/27/2013 1114   HCT 39.0 07/27/2013 1114   PLT 253 07/27/2013 1114   MCV 92.9 07/27/2013 1114   MCH 29.5 07/27/2013 1114   MCHC 31.8 07/27/2013 1114   RDW 13.9 07/27/2013 1114   LYMPHSABS 2.6 07/27/2013 1114   MONOABS 0.7 07/27/2013 1114   EOSABS 0.0 07/27/2013 1114   BASOSABS 0.0 07/27/2013 1114      Chemistry      Component Value Date/Time   NA 142 07/27/2013 1114   K 4.2 07/27/2013 1114   CL 104 07/27/2013 1114   CO2 26 07/27/2013 1114   BUN 9 07/27/2013 1114   CREATININE 0.82 07/27/2013 1114      Component Value Date/Time   CALCIUM 9.3 07/27/2013 1114   ALKPHOS 48 07/27/2013 1114   AST 18 07/27/2013 1114   ALT 13 07/27/2013 1114   BILITOT 0.6 07/27/2013 1114     Lab Results  Component Value Date   LABCA2 26 03/16/2013      ASSESSMENT:  Stage IA (T1b, N0, M0) invasive breast cancer, ER+ 99%, PR 13%, and Her2+, initially treated with weekly Docetaxel and Herceptin. She received 2 cycles of  this regimen with intolerance to Docetaxel leading to its discontinuation. Now on Herceptin IV every 3 weeks which she will take for 52 week (finishing in December 2015).  2. Out-of-proportion intolerance to Docetaxel  3. Paroxysmal atrial fibrillation, on long-term anticoagulation.  4. Hypertension, controlled.  5. Dyspepsia, GI appt with Neil Crouch on 4/16  6. AM nausea, improved  7. 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 Fatigue, "Extreme". I suspect she is at baseline. Suspect multifactorial with component of depression. 9. Normal bone density on 08/03/2013.  Repeat bone density in April 2017.  Patient Active Problem List   Diagnosis Date Noted  . Encounter for therapeutic drug monitoring 06/17/2013  . Paroxysmal atrial fibrillation 03/17/2013  . Vitamin D deficiency 03/16/2013  . Breast cancer of upper-inner quadrant of left female breast 03/16/2013  . Atrial fibrillation   . Hypertension   . Chronic anticoagulation 11/11/2010  . Hyperlipidemia 02/26/2010     PLAN:  1. I personally reviewed and went over laboratory results with the patient.  The results are noted within this dictation. 2. I personally reviewed and went over radiographic studies with the patient.  The results are noted within this  dictation.   3. Continue with Herceptin every 21 days as scheduled 4. Continue with Arimidex as planned 5. Pre-chemo labs as scheduled: CBC diff, CMET 6. Follow-up with Neil Crouch (GI) as scheduled on 4/16. 7. MUGA scan as scheduled in May 2015. 8. Refill on Arimidex with 3 refills escribed to [facility]. 9. Continue with Calcium and Vitamin D 10. Follow-up on 5/13 as schedule for follow-up   THERAPY PLAN:  She is to continue with therapy as planned.  All questions were answered. The patient knows to call the clinic with any problems, questions or concerns. We can certainly see the patient much sooner if  necessary.  Patient and plan discussed with Dr. Farrel Gobble and he is in agreement with the aforementioned.   [facility] 08/10/2013

## 2013-08-10 ENCOUNTER — Ambulatory Visit (INDEPENDENT_AMBULATORY_CARE_PROVIDER_SITE_OTHER): Payer: Medicare Other | Admitting: *Deleted

## 2013-08-10 ENCOUNTER — Encounter (HOSPITAL_BASED_OUTPATIENT_CLINIC_OR_DEPARTMENT_OTHER): Payer: Medicare Other | Admitting: Oncology

## 2013-08-10 ENCOUNTER — Encounter (HOSPITAL_COMMUNITY): Payer: Self-pay | Admitting: Oncology

## 2013-08-10 VITALS — BP 148/79 | HR 67 | Temp 97.9°F | Resp 18 | Wt 145.3 lb

## 2013-08-10 DIAGNOSIS — C50219 Malignant neoplasm of upper-inner quadrant of unspecified female breast: Secondary | ICD-10-CM

## 2013-08-10 DIAGNOSIS — R5383 Other fatigue: Secondary | ICD-10-CM

## 2013-08-10 DIAGNOSIS — Z5181 Encounter for therapeutic drug level monitoring: Secondary | ICD-10-CM

## 2013-08-10 DIAGNOSIS — R5381 Other malaise: Secondary | ICD-10-CM

## 2013-08-10 DIAGNOSIS — I4891 Unspecified atrial fibrillation: Secondary | ICD-10-CM

## 2013-08-10 DIAGNOSIS — Z170 Estrogen receptor positive status [ER+]: Secondary | ICD-10-CM

## 2013-08-10 DIAGNOSIS — Z7901 Long term (current) use of anticoagulants: Secondary | ICD-10-CM

## 2013-08-10 DIAGNOSIS — C50212 Malignant neoplasm of upper-inner quadrant of left female breast: Secondary | ICD-10-CM

## 2013-08-10 LAB — POCT INR: INR: 1.9

## 2013-08-10 MED ORDER — ANASTROZOLE 1 MG PO TABS
1.0000 mg | ORAL_TABLET | Freq: Every day | ORAL | Status: DC
Start: 2013-08-10 — End: 2014-07-05

## 2013-08-10 NOTE — Patient Instructions (Signed)
[facility] Discharge Instructions  RECOMMENDATIONS MADE BY THE CONSULTANT AND ANY TEST RESULTS WILL BE SENT TO YOUR REFERRING PHYSICIAN.  EXAM FINDINGS BY THE PHYSICIAN TODAY AND SIGNS OR SYMPTOMS TO REPORT TO CLINIC OR PRIMARY PHYSICIAN: Exam and findings as discussed by Robynn Pane, PA-C.  You are doing will.  Report any new lumps, bone pain, shortness of breath or other symptoms.  MEDICATIONS PRESCRIBED:  Refill for anastrozole sent to your pharmacy  INSTRUCTIONS/FOLLOW-UP: Follow-up as scheduled.  Thank you for choosing [facility] to provide your oncology and hematology care.  To afford each patient quality time with our providers, please arrive at least 15 minutes before your scheduled appointment time.  With your help, our goal is to use those 15 minutes to complete the necessary work-up to ensure our physicians have the information they need to help with your evaluation and healthcare recommendations.    Effective January 1st, 2014, we ask that you re-schedule your appointment with our physicians should you arrive 10 or more minutes late for your appointment.  We strive to give you quality time with our providers, and arriving late affects you and other patients whose appointments are after yours.    Again, thank you for choosing [facility].  Our hope is that these requests will decrease the amount of time that you wait before being seen by our physicians.       _____________________________________________________________  Should you have questions after your visit to [facility], please contact our office at [phone] between the hours of 8:30 a.m. and 5:00 p.m.  Voicemails left after 4:30 p.m. will not be returned until the following business day.  For prescription refill requests, have your pharmacy contact our office with your prescription refill request.

## 2013-08-11 ENCOUNTER — Encounter: Payer: Self-pay | Admitting: Gastroenterology

## 2013-08-11 ENCOUNTER — Ambulatory Visit (INDEPENDENT_AMBULATORY_CARE_PROVIDER_SITE_OTHER): Payer: Medicare Other | Admitting: Gastroenterology

## 2013-08-11 VITALS — BP 126/65 | HR 73 | Temp 97.6°F | Ht 61.0 in | Wt 145.6 lb

## 2013-08-11 DIAGNOSIS — R143 Flatulence: Secondary | ICD-10-CM

## 2013-08-11 DIAGNOSIS — R140 Abdominal distension (gaseous): Secondary | ICD-10-CM | POA: Insufficient documentation

## 2013-08-11 DIAGNOSIS — R141 Gas pain: Secondary | ICD-10-CM | POA: Diagnosis not present

## 2013-08-11 DIAGNOSIS — R142 Eructation: Secondary | ICD-10-CM | POA: Diagnosis not present

## 2013-08-11 DIAGNOSIS — K219 Gastro-esophageal reflux disease without esophagitis: Secondary | ICD-10-CM

## 2013-08-11 MED ORDER — PANTOPRAZOLE SODIUM 40 MG PO TBEC: 40.0000 mg | DELAYED_RELEASE_TABLET | Freq: Every day | ORAL | Status: DC

## 2013-08-11 MED ORDER — RESTORA PO CAPS: 1.0000 | ORAL_CAPSULE | Freq: Every day | ORAL | Status: DC

## 2013-08-11 NOTE — Patient Instructions (Signed)
1. Stop omeprazole. 2. Start pantoprazole one daily before breakfast. We can increase to twice a day if you are still having problems, just let me know. 3. Take [place]ora one daily for gas. Samples for three weeks provided. You can stop once complete. 4. Office visit in six weeks. Call sooner if you are having any problems.   Bloating Bloating is the feeling of fullness in your belly. You may feel as though your pants are too tight. Often the cause of bloating is overeating, retaining fluids, or having gas in your bowel. It is also caused by swallowing air and eating foods that cause gas. Irritable bowel syndrome is one of the most common causes of bloating. Constipation is also a common cause. Sometimes more serious problems can cause bloating. SYMPTOMS  Usually there is a feeling of fullness, as though your abdomen is bulged out. There may be mild discomfort.  DIAGNOSIS  Usually no particular testing is necessary for most bloating. If the condition persists and seems to become worse, your caregiver may do additional testing.  TREATMENT   There is no direct treatment for bloating.  Do not put gas into the bowel. Avoid chewing gum and sucking on candy. These tend to make you swallow air. Swallowing air can also be a nervous habit. Try to avoid this.  Avoiding high residue diets will help. Eat foods with soluble fibers (examples include root vegetables, apples, or barley) and substitute dairy products with soy and rice products. This helps irritable bowel syndrome.  If constipation is the cause, then a high residue diet with more fiber will help.  Avoid carbonated beverages.  Over-the-counter preparations are available that help reduce gas. Your pharmacist can help you with this. SEEK MEDICAL CARE IF:   Bloating continues and seems to be getting worse.  You notice a weight gain.  You have a weight loss but the bloating is getting worse.  You have changes in your bowel habits or develop  nausea or vomiting. SEEK IMMEDIATE MEDICAL CARE IF:   You develop shortness of breath or swelling in your legs.  You have an increase in abdominal pain or develop chest pain. Document Released: 02/12/2006 Document Revised: 07/07/2011 Document Reviewed: 04/02/2007 [facility] Patient Information 2014 [place].

## 2013-08-11 NOTE — Progress Notes (Signed)
Primary Care Physician:  [name], MD  Primary Gastroenterologist:    Chief Complaint  Patient presents with  . Gastrophageal Reflux    HPI:  Stacie Huang is a 73 y.o. female here for further evaluation of dyspepsia, nausea. Stage IA invasive breast cancer currently on Herceptin IV every 3 weeks and will complete therapy in December. Anastrozole started March 2015.  Has been on prilosec 40mg  daily for years. Recently added 20mg  at night. Increased flatulence, belching over past couple of months. Lots of heartburn, but not every day. No abdominal pain. BM regular. No melena, brbpr. No dysphagia.   No probiotics. Does not eat yogurt. Weight has stabilized. Lost about 10 pounds with initial chemo.   Recently had subtherapeutic INR, next check April 29th.  Current Outpatient Prescriptions  Medication Sig Dispense Refill  . anastrozole (ARIMIDEX) 1 MG tablet Take 1 tablet (1 mg total) by mouth daily.  30 tablet  3  . atenolol (TENORMIN) 100 MG tablet Take 50 mg by mouth daily before breakfast.       . calcium carbonate (OS-CAL) 600 MG TABS tablet Take 600 mg by mouth daily.      . cetirizine (ZYRTEC) 10 MG tablet Take 10 mg by mouth every evening.       . Cholecalciferol (VITAMIN D) 400 UNITS capsule Take 400 Units by mouth every morning.       . diltiazem (TIAZAC) 120 MG 24 hr capsule Take 1 capsule (120 mg total) by mouth every morning.  30 capsule  6  . fluticasone (FLONASE) 50 MCG/ACT nasal spray Place 2 sprays into the nose at bedtime.       Marland Kitchen LORazepam (ATIVAN) 1 MG tablet Take 1 mg by mouth daily as needed for anxiety (AND/OR FOR SLEEP).       Marland Kitchen losartan (COZAAR) 50 MG tablet Take 50 mg by mouth every morning.       . metoCLOPramide (REGLAN) 5 MG tablet Take 1 tablet (5 mg total) by mouth 4 (four) times daily as needed for nausea or vomiting.  60 tablet  3  . omeprazole (PRILOSEC) 20 MG capsule Take 2 capsules by mouth in the am and 1 capsule at bedtime.  90 capsule  1  .  prochlorperazine (COMPAZINE) 10 MG tablet Take 1 tablet (10 mg total) by mouth 4 (four) times daily as needed for nausea or vomiting.  60 tablet  3  . psyllium (METAMUCIL) 58.6 % powder Take 1 packet by mouth 3 (three) times daily.      . simvastatin (ZOCOR) 10 MG tablet Take 10 mg by mouth at bedtime.        . triamcinolone cream (KENALOG) 0.1 % Apply 1 application topically at bedtime.      Marland Kitchen warfarin (COUMADIN) 2.5 MG tablet Take 1.25-2.5 mg by mouth See admin instructions. Takes 2.5 mg (1 tablet) on Tuesday and Saturday takes 1.25 mg (0.5 tablet) all other days       No current facility-administered medications for this visit.    Allergies as of 08/11/2013 - Review Complete 08/11/2013  Allergen Reaction Noted  . Iohexol Hives 03/17/2008  . Sulfa antibiotics Rash 05/26/2011    Past Medical History  Diagnosis Date  . Hypertension   . Paroxysmal atrial fibrillation   . Chronic anticoagulation   . Hyperlipidemia   . GERD (gastroesophageal reflux disease)   . Cancer   . Breast cancer     Past Surgical History  Procedure Laterality Date  . Abdominal  hysterectomy    . Cholecystectomy    . Partial mastectomy with axillary sentinel lymph node biopsy Left 02/23/2013    Procedure: LEFT PARTIAL MASTECTOMY WITH AXILLARY SIMPLE NODE BIOPSY, LEFT BREAST WIDE EXCISION;  Surgeon: Scherry Ran, MD;  Location: [facility];  Service: General;  Laterality: Left;  . Axillary lymph node dissection Left 02/23/2013    Procedure: LEFT AXILLARY LYMPH NODE DISSECTION;  Surgeon: Scherry Ran, MD;  Location: [facility];  Service: General;  Laterality: Left;  . Portacath placement Right 04/07/2013  . Portacath placement Right 04/07/2013    Procedure: INSERTION PORT-A-CATH;  Surgeon: Scherry Ran, MD;  Location: [facility];  Service: General;  Laterality: Right;  . Esophagogastroduodenoscopy  03/10/2002    YQM:VHQION esophagus/small HH/Tiny AVM in antrum, otherwise normal stomach and D1 and D2/Status  post passage of the [facility] dilator  . Colonoscopy  03/10/2002    RMR: Incomplete colonoscopy (sigmoidoscopy)/Normal rectum/Normal colon to 40 cm.     Family History  Problem Relation Age of Onset  . Heart disease Mother     Also 36 of 9 siblings  . Heart attack Father   . Cancer      2 siblings  . Leukemia      History   Social History  . Marital Status: Legally Separated    Spouse Name: N/A    Number of Children:  2  . Years of Education: N/A   Occupational History  . Retired from [employer]  . Smoking status: Never Smoker   . Smokeless tobacco: Never Used  . Alcohol Use: No  . Drug Use: No  . Sexual Activity: Yes    Birth Control/ Protection: Surgical   Other Topics Concern  . Not on file   Social History Narrative   2 adult children      ROS:  General: Negative for anorexia, weight loss, fever, chills, fatigue, weakness. Eyes: Negative for vision changes.  ENT: Negative for hoarseness, difficulty swallowing , nasal congestion. CV: Negative for chest pain, angina, palpitations, dyspnea on exertion, peripheral edema.  Respiratory: Negative for dyspnea at rest, dyspnea on exertion, cough, sputum, wheezing.  GI: See history of present illness. GU:  Negative for dysuria, hematuria, urinary incontinence, urinary frequency, nocturnal urination.  MS: Negative for joint pain, low back pain.  Derm: Negative for rash or itching.  Neuro: Negative for weakness, abnormal sensation, seizure, frequent headaches, memory loss, confusion.  Psych: Negative for anxiety, depression, suicidal ideation, hallucinations.  Endo: Negative for unusual weight change.  Heme: Negative for bruising or bleeding. Allergy: Negative for rash or hives.    Physical Examination:  BP 126/65  Pulse 73  Temp(Src) 97.6 F (36.4 C) (Oral)  Ht 5\' 1"  (1.549 m)  Wt 145 lb 9.6 oz (66.044 kg)  BMI 27.53 kg/m2   General: Well-nourished, well-developed  in no acute distress.  Head: Normocephalic, atraumatic.   Eyes: Conjunctiva pink, no icterus. Mouth: Oropharyngeal mucosa moist and pink , no lesions erythema or exudate. Neck: Supple without thyromegaly, masses, or lymphadenopathy.  Lungs: Clear to auscultation bilaterally.  Heart: Regular rate and rhythm, no murmurs rubs or gallops.  Abdomen: Bowel sounds are normal, nontender, nondistended, no hepatosplenomegaly or masses, no abdominal bruits or    hernia , no rebound or guarding.   Rectal: not performed Extremities: No lower extremity edema. No clubbing or deformities.  Neuro: Alert and oriented x 4 , grossly normal neurologically.  Skin: Warm and dry,  no rash or jaundice.   Psych: Alert and cooperative, normal mood and affect.  Labs: Lab Results  Component Value Date   WBC 7.6 07/27/2013   HGB 12.4 07/27/2013   HCT 39.0 07/27/2013   MCV 92.9 07/27/2013   PLT 253 07/27/2013   Lab Results  Component Value Date   CREATININE 0.82 07/27/2013   BUN 9 07/27/2013   NA 142 07/27/2013   K 4.2 07/27/2013   CL 104 07/27/2013   CO2 26 07/27/2013    Lab Results  Component Value Date   ALT 13 07/27/2013   AST 18 07/27/2013   ALKPHOS 48 07/27/2013   BILITOT 0.6 07/27/2013   Lab Results  Component Value Date   INR 1.9 08/10/2013   INR 2.0 07/22/2013   INR 2.3 07/15/2013     Imaging Studies: Dg Bone Density  08/02/2013   EXAM: DG DEXA AXIAL SKELETON  The Bone Mineral Densitometry hard-copy report (which includes all data, graphical display, and FRAX results when applicable) has been sent directly to the ordering physician.  This report can also be obtained electronically by viewing images for this exam through the performing facility's EMR, or by logging directly into [employer].   Electronically Signed   By: Lavonia Dana M.D.   On: 08/02/2013 12:02

## 2013-08-14 NOTE — Assessment & Plan Note (Signed)
73 y/o female with breast cancer currently undergoing chemotherapy who presents for GERD/dyspepsia/bloating. She is not interested in diagnostic studies such as UGIs or EGD. Trial of switching PPI to pantoprazole. Add probiotic. Avoid foods causing bloating, antireflux measures. Handout provided.   OV in six weeks or sooner if needed. Discussed screening colonoscopy once she gets through breast cancer treatment.

## 2013-08-15 NOTE — Progress Notes (Signed)
cc'd to pcp 

## 2013-08-17 ENCOUNTER — Encounter (HOSPITAL_BASED_OUTPATIENT_CLINIC_OR_DEPARTMENT_OTHER): Payer: Medicare Other

## 2013-08-17 VITALS — BP 132/72 | HR 81 | Temp 98.1°F | Wt 144.8 lb

## 2013-08-17 DIAGNOSIS — C50219 Malignant neoplasm of upper-inner quadrant of unspecified female breast: Secondary | ICD-10-CM

## 2013-08-17 DIAGNOSIS — Z5112 Encounter for antineoplastic immunotherapy: Secondary | ICD-10-CM

## 2013-08-17 DIAGNOSIS — C50212 Malignant neoplasm of upper-inner quadrant of left female breast: Secondary | ICD-10-CM

## 2013-08-17 LAB — CBC WITH DIFFERENTIAL/PLATELET
Basophils Absolute: 0 10*3/uL (ref 0.0–0.1)
Basophils Relative: 1 % (ref 0–1)
EOS PCT: 1 % (ref 0–5)
Eosinophils Absolute: 0.1 10*3/uL (ref 0.0–0.7)
HEMATOCRIT: 38.9 % (ref 36.0–46.0)
HEMOGLOBIN: 12.3 g/dL (ref 12.0–15.0)
Lymphocytes Relative: 36 % (ref 12–46)
Lymphs Abs: 2.7 10*3/uL (ref 0.7–4.0)
MCH: 29.2 pg (ref 26.0–34.0)
MCHC: 31.6 g/dL (ref 30.0–36.0)
MCV: 92.4 fL (ref 78.0–100.0)
MONOS PCT: 10 % (ref 3–12)
Monocytes Absolute: 0.8 10*3/uL (ref 0.1–1.0)
NEUTROS ABS: 3.9 10*3/uL (ref 1.7–7.7)
Neutrophils Relative %: 52 % (ref 43–77)
Platelets: 265 10*3/uL (ref 150–400)
RBC: 4.21 MIL/uL (ref 3.87–5.11)
RDW: 14 % (ref 11.5–15.5)
WBC: 7.4 10*3/uL (ref 4.0–10.5)

## 2013-08-17 LAB — COMPREHENSIVE METABOLIC PANEL
ALBUMIN: 3.8 g/dL (ref 3.5–5.2)
ALT: 12 U/L (ref 0–35)
AST: 17 U/L (ref 0–37)
Alkaline Phosphatase: 51 U/L (ref 39–117)
BUN: 10 mg/dL (ref 6–23)
CALCIUM: 8.9 mg/dL (ref 8.4–10.5)
CHLORIDE: 103 meq/L (ref 96–112)
CO2: 25 meq/L (ref 19–32)
CREATININE: 0.78 mg/dL (ref 0.50–1.10)
GFR calc Af Amer: >90 mL/min (ref >90)
GFR, EST NON AFRICAN AMERICAN: 81 mL/min — AB (ref >90)
Glucose, Bld: 102 mg/dL — ABNORMAL HIGH (ref 70–99)
Potassium: 4.1 mEq/L (ref 3.7–5.3)
Sodium: 141 mEq/L (ref 137–147)
Total Bilirubin: 0.5 mg/dL (ref 0.3–1.2)
Total Protein: 6.6 g/dL (ref 6.0–8.3)

## 2013-08-17 MED ORDER — TRASTUZUMAB CHEMO INJECTION 440 MG
6.0000 mg/kg | Freq: Once | INTRAVENOUS | Status: AC
  Administered 2013-08-17: 420 mg via INTRAVENOUS
  Filled 2013-08-17: qty 20

## 2013-08-17 MED ORDER — ACETAMINOPHEN 325 MG PO TABS
650.0000 mg | ORAL_TABLET | Freq: Once | ORAL | Status: AC
  Administered 2013-08-17: 650 mg via ORAL
  Filled 2013-08-17: qty 2

## 2013-08-17 MED ORDER — SODIUM CHLORIDE 0.9 % IJ SOLN
10.0000 mL | INTRAMUSCULAR | Status: DC | PRN
  Administered 2013-08-17: 10 mL

## 2013-08-17 MED ORDER — SODIUM CHLORIDE 0.9 % IV SOLN
Freq: Once | INTRAVENOUS | Status: AC
  Administered 2013-08-17: 11:00:00 via INTRAVENOUS

## 2013-08-17 MED ORDER — HEPARIN SOD (PORK) LOCK FLUSH 100 UNIT/ML IV SOLN
500.0000 [IU] | Freq: Once | INTRAVENOUS | Status: AC | PRN
  Administered 2013-08-17: 500 [IU]
  Filled 2013-08-17: qty 5

## 2013-08-17 MED ORDER — DIPHENHYDRAMINE HCL 25 MG PO CAPS
50.0000 mg | ORAL_CAPSULE | Freq: Once | ORAL | Status: AC
  Administered 2013-08-17: 50 mg via ORAL
  Filled 2013-08-17: qty 2

## 2013-08-17 NOTE — Patient Instructions (Signed)
[facility] Discharge Instructions for Patients Receiving Chemotherapy  Today you received the following chemotherapy agents herceptin   BELOW ARE SYMPTOMS THAT SHOULD BE REPORTED IMMEDIATELY:  *FEVER GREATER THAN 101.0 F  *CHILLS WITH OR WITHOUT FEVER  NAUSEA AND VOMITING THAT IS NOT CONTROLLED WITH YOUR NAUSEA MEDICATION  *UNUSUAL SHORTNESS OF BREATH  *UNUSUAL BRUISING OR BLEEDING  TENDERNESS IN MOUTH AND THROAT WITH OR WITHOUT PRESENCE OF ULCERS  *URINARY PROBLEMS  *BOWEL PROBLEMS  UNUSUAL RASH Items with * indicate a potential emergency and should be followed up as soon as possible.

## 2013-08-17 NOTE — Progress Notes (Signed)
Tolerated well

## 2013-08-24 ENCOUNTER — Ambulatory Visit (INDEPENDENT_AMBULATORY_CARE_PROVIDER_SITE_OTHER): Payer: Medicare Other | Admitting: *Deleted

## 2013-08-24 DIAGNOSIS — I4891 Unspecified atrial fibrillation: Secondary | ICD-10-CM

## 2013-08-24 DIAGNOSIS — Z7901 Long term (current) use of anticoagulants: Secondary | ICD-10-CM

## 2013-08-24 DIAGNOSIS — Z5181 Encounter for therapeutic drug level monitoring: Secondary | ICD-10-CM

## 2013-08-24 LAB — POCT INR: INR: 2.7

## 2013-09-02 ENCOUNTER — Telehealth (HOSPITAL_COMMUNITY): Payer: Self-pay

## 2013-09-02 ENCOUNTER — Telehealth: Payer: Self-pay | Admitting: Internal Medicine

## 2013-09-02 NOTE — Telephone Encounter (Signed)
Agree.  By days end, if she has not heard back from PCP, please have her call us back.

## 2013-09-02 NOTE — Telephone Encounter (Signed)
Pt has OV on 5/26 to see RMR, but she is having problems with her stomach gurgling and waking her up at 4am every morning. She is taking a nausea pill which helps some, but she said that the protonix doesn't seem to be helping her any. She wants to know what RMR advises or can she be seen sooner. I told her at the moment I have nothing any sooner with RMR and I would let the nurse be aware of her concerns. She asked to reach her on either number. [phone] or [phone]

## 2013-09-02 NOTE — Telephone Encounter (Signed)
Call from patient.  States "I may have a UTI.  My bottom burns really bad and haven't felt good for about a week."  Denies pain, burning, frequency, urgency, fevers or back pain. When questioned further burning discomfort is in vaginal area but denies any discharge or itching.  Has already called Dr. Roseanne Kaufman office regarding nausea and abdominal "grumbling".  Recommended that she contact/see her PCP.

## 2013-09-06 ENCOUNTER — Other Ambulatory Visit (HOSPITAL_COMMUNITY): Payer: Self-pay | Admitting: *Deleted

## 2013-09-06 ENCOUNTER — Telehealth (HOSPITAL_COMMUNITY): Payer: Self-pay | Admitting: *Deleted

## 2013-09-06 DIAGNOSIS — C50212 Malignant neoplasm of upper-inner quadrant of left female breast: Secondary | ICD-10-CM

## 2013-09-06 MED ORDER — METOCLOPRAMIDE HCL 10 MG PO TABS: 10.0000 mg | ORAL_TABLET | Freq: Three times a day (TID) | ORAL | Status: DC

## 2013-09-06 NOTE — Telephone Encounter (Signed)
Patient called me today to let me know that she is having days that she just doesn't feel good at all. She is supposed to see Dr. Gala Romney at the end of the month. She states that she is having an increase in acid production. She states that her esophagus is burning. Her BMs are burning her anus also but not as bad as it was on Friday. No diet changes. She takes 1 Protonix 40mg  tab daily. Can she increase to BID? She called Dr. Roseanne Kaufman office last Friday and they didn't call her back. She has dry heaves just about every morning (when getting out of bed) - this has been going on since chemo/antibody treatment started. Patient takes Reglan 1 tab every am with Protonix. I suggested to patient to take 1 Reglan at bedtime to see if that would help with the dry heaves in the morning as well. Any other suggestions?

## 2013-09-06 NOTE — Telephone Encounter (Signed)
Patient instructed to continue same dose of Protonix per Dr. Barnet Glasgow and to increase the Reglan to 10mg  TID AC and at bedtime. Patient read back instructions after writing them down and verbalized understanding. Patient to call later this week and let me know how her nausea is doing. Pt to be seen by Dr. Barnet Glasgow tomorrow.

## 2013-09-06 NOTE — Telephone Encounter (Signed)
Spoke with pt- she has been doing good until Friday and now has a lot of gas, nausea and burning. Takes protonix and reglan 5mg  prior to breakfast every morning and does well until about 3-4am and that is when everything starts. She has not had any diarrhea, stool is formed but soft. She finished all her samples of restora we gave her and felt better when taking it. LSL told her to stop taking it after she finished the samples, pt said she couldn't afford to buy it anyway. She said she [facility] to the nurse at the [facility] and was told to increase her reglan to 10mg  ac and hs. Pt has only been taking reglan 5mg  qd.  Pt wants to know if there is anything else she can do until her ov with RMR on 5/26?

## 2013-09-06 NOTE — Telephone Encounter (Signed)
This message was taken Friday at 1023 and not routed to me until today (09/06/13) at 213 PM. We need to address more promptly.   I would recommend increase pantoprazole to 40mg  BID, once before breakfast and once before evening meal.  If most of her symptoms occur during the night/early AM, I would consider taking Reglan at bedtime. Would limit Reglan as much as possible due to side effect risk of tardive dyskinesia. Consider once before breakfast or lunch and once at bedtime.   She can get probiotics from yogurt with live culture, eat two daily.   Keep OV with RMR.

## 2013-09-07 ENCOUNTER — Encounter (HOSPITAL_BASED_OUTPATIENT_CLINIC_OR_DEPARTMENT_OTHER): Payer: Medicare Other

## 2013-09-07 ENCOUNTER — Encounter (HOSPITAL_COMMUNITY): Payer: Medicare Other | Attending: Hematology and Oncology

## 2013-09-07 ENCOUNTER — Encounter (HOSPITAL_COMMUNITY): Payer: Self-pay

## 2013-09-07 ENCOUNTER — Other Ambulatory Visit (HOSPITAL_COMMUNITY): Payer: Self-pay | Admitting: Hematology and Oncology

## 2013-09-07 VITALS — BP 123/98 | HR 74 | Temp 98.2°F | Resp 18 | Wt 139.4 lb

## 2013-09-07 VITALS — BP 120/61 | HR 105 | Temp 97.8°F | Resp 16

## 2013-09-07 DIAGNOSIS — C50219 Malignant neoplasm of upper-inner quadrant of unspecified female breast: Secondary | ICD-10-CM

## 2013-09-07 DIAGNOSIS — Z79811 Long term (current) use of aromatase inhibitors: Secondary | ICD-10-CM

## 2013-09-07 DIAGNOSIS — C50212 Malignant neoplasm of upper-inner quadrant of left female breast: Secondary | ICD-10-CM

## 2013-09-07 DIAGNOSIS — Z5112 Encounter for antineoplastic immunotherapy: Secondary | ICD-10-CM

## 2013-09-07 DIAGNOSIS — Z170 Estrogen receptor positive status [ER+]: Secondary | ICD-10-CM

## 2013-09-07 DIAGNOSIS — K219 Gastro-esophageal reflux disease without esophagitis: Secondary | ICD-10-CM

## 2013-09-07 LAB — CBC WITH DIFFERENTIAL/PLATELET
Basophils Absolute: 0 10*3/uL (ref 0.0–0.1)
Basophils Relative: 1 % (ref 0–1)
EOS ABS: 0 10*3/uL (ref 0.0–0.7)
EOS PCT: 1 % (ref 0–5)
HCT: 39.6 % (ref 36.0–46.0)
Hemoglobin: 12.9 g/dL (ref 12.0–15.0)
LYMPHS ABS: 3.3 10*3/uL (ref 0.7–4.0)
LYMPHS PCT: 37 % (ref 12–46)
MCH: 29.2 pg (ref 26.0–34.0)
MCHC: 32.6 g/dL (ref 30.0–36.0)
MCV: 89.6 fL (ref 78.0–100.0)
Monocytes Absolute: 0.8 10*3/uL (ref 0.1–1.0)
Monocytes Relative: 9 % (ref 3–12)
NEUTROS PCT: 52 % (ref 43–77)
Neutro Abs: 4.6 10*3/uL (ref 1.7–7.7)
Platelets: 296 10*3/uL (ref 150–400)
RBC: 4.42 MIL/uL (ref 3.87–5.11)
RDW: 13.7 % (ref 11.5–15.5)
WBC: 8.8 10*3/uL (ref 4.0–10.5)

## 2013-09-07 LAB — COMPREHENSIVE METABOLIC PANEL
ALT: 12 U/L (ref 0–35)
AST: 17 U/L (ref 0–37)
Albumin: 4.3 g/dL (ref 3.5–5.2)
Alkaline Phosphatase: 54 U/L (ref 39–117)
BUN: 14 mg/dL (ref 6–23)
CALCIUM: 9.7 mg/dL (ref 8.4–10.5)
CO2: 25 mEq/L (ref 19–32)
Chloride: 104 mEq/L (ref 96–112)
Creatinine, Ser: 0.86 mg/dL (ref 0.50–1.10)
GFR calc Af Amer: 75 mL/min — ABNORMAL LOW (ref >90)
GFR calc non Af Amer: 65 mL/min — ABNORMAL LOW (ref >90)
GLUCOSE: 90 mg/dL (ref 70–99)
Potassium: 4.4 mEq/L (ref 3.7–5.3)
SODIUM: 142 meq/L (ref 137–147)
TOTAL PROTEIN: 7.1 g/dL (ref 6.0–8.3)
Total Bilirubin: 0.7 mg/dL (ref 0.3–1.2)

## 2013-09-07 MED ORDER — HEPARIN SOD (PORK) LOCK FLUSH 100 UNIT/ML IV SOLN
500.0000 [IU] | Freq: Once | INTRAVENOUS | Status: AC | PRN
  Administered 2013-09-07: 500 [IU]
  Filled 2013-09-07: qty 5

## 2013-09-07 MED ORDER — ACETAMINOPHEN 325 MG PO TABS
ORAL_TABLET | ORAL | Status: AC
  Filled 2013-09-07: qty 1

## 2013-09-07 MED ORDER — DIPHENHYDRAMINE HCL 25 MG PO CAPS
50.0000 mg | ORAL_CAPSULE | Freq: Once | ORAL | Status: AC
  Administered 2013-09-07: 50 mg via ORAL

## 2013-09-07 MED ORDER — DIPHENHYDRAMINE HCL 25 MG PO CAPS
ORAL_CAPSULE | ORAL | Status: AC
  Filled 2013-09-07: qty 2

## 2013-09-07 MED ORDER — SODIUM CHLORIDE 0.9 % IV SOLN
Freq: Once | INTRAVENOUS | Status: AC
  Administered 2013-09-07: 12:00:00 via INTRAVENOUS

## 2013-09-07 MED ORDER — ACETAMINOPHEN 325 MG PO TABS
650.0000 mg | ORAL_TABLET | Freq: Once | ORAL | Status: AC
  Administered 2013-09-07: 650 mg via ORAL

## 2013-09-07 MED ORDER — SODIUM CHLORIDE 0.9 % IV SOLN
6.0000 mg/kg | Freq: Once | INTRAVENOUS | Status: AC
  Administered 2013-09-07: 420 mg via INTRAVENOUS
  Filled 2013-09-07: qty 20

## 2013-09-07 MED ORDER — SODIUM CHLORIDE 0.9 % IJ SOLN: 10.0000 mL | INTRAMUSCULAR | Status: DC | PRN

## 2013-09-07 NOTE — Progress Notes (Signed)
Tolerated well

## 2013-09-07 NOTE — Telephone Encounter (Signed)
Pt called back and wants a new rx for pantoprazole bid sent to [facility].

## 2013-09-07 NOTE — Telephone Encounter (Signed)
Tried to call pt-lmom

## 2013-09-07 NOTE — Patient Instructions (Signed)
[facility] Discharge Instructions  RECOMMENDATIONS MADE BY THE CONSULTANT AND ANY TEST RESULTS WILL BE SENT TO YOUR REFERRING PHYSICIAN.  EXAM FINDINGS BY THE PHYSICIAN TODAY AND SIGNS OR SYMPTOMS TO REPORT TO CLINIC OR PRIMARY PHYSICIAN: Exam and findings as discussed by Dr. Barnet Glasgow. Continue the reglan 10 mg before meals and at bedtime, the protonix daily and use an antacid (maalox plus or mylanta) as needed.  Keep you follow-up with Dr. Gala Romney.  Report fevers, chills, etc.    INSTRUCTIONS/FOLLOW-UP: Follow-up in 3 weeks.  Thank you for choosing [facility] to provide your oncology and hematology care.  To afford each patient quality time with our providers, please arrive at least 15 minutes before your scheduled appointment time.  With your help, our goal is to use those 15 minutes to complete the necessary work-up to ensure our physicians have the information they need to help with your evaluation and healthcare recommendations.    Effective January 1st, 2014, we ask that you re-schedule your appointment with our physicians should you arrive 10 or more minutes late for your appointment.  We strive to give you quality time with our providers, and arriving late affects you and other patients whose appointments are after yours.    Again, thank you for choosing [facility].  Our hope is that these requests will decrease the amount of time that you wait before being seen by our physicians.       _____________________________________________________________  Should you have questions after your visit to [facility], please contact our office at [phone] between the hours of 8:30 a.m. and 5:00 p.m.  Voicemails left after 4:30 p.m. will not be returned until the following business day.  For prescription refill requests, have your pharmacy contact our office with your prescription refill request.

## 2013-09-07 NOTE — Progress Notes (Signed)
[facility]  OFFICE PROGRESS NOTE  [name], MD [address] [place] [address]  DIAGNOSIS: Breast cancer of upper-inner quadrant of left female breast - Plan: [place]  Chief Complaint  Patient presents with  . Breast Cancer  . Heartburn    CURRENT THERAPY: Herceptin intravenously every 3 weeks to complete one year of therapy which will be in December 2015, anastrozole 1 mg daily started 06/28/2013  INTERVAL HISTORY: Stacie Huang 73 y.o. female returns for followup of stage I a invasive breast cancer, ER positive, PR positive, HER-2/neu over amplified recently treated with Taxol/Herceptin and after 2 cycles of this regimen develop an intolerance to Taxol now on Herceptin every 3 weeks alone with plans to finish treatment in December of 2015 giving the agent every 3 weeks with anastrozole started on 06/28/2013.6 She's had more problems with reflux symptoms and was told to increase Reglan to 10 mg 4 times a day before meals and at bedtime and to continue on protonic 40 mg daily. There has been some improvement but she still develops breakthrough heartburn symptoms. She denies any abnormalities on self breast examination, lymphedema, lower extremity swelling or redness, cough, wheezing, nasal drip, earache, skin rash, or seizures. Patient denies epistaxis, melena, hematochezia, hematuria, vaginal bleeding. Patient denies PND, orthopnea, or palpitations.  MEDICAL HISTORY: Past Medical History  Diagnosis Date  . Hypertension   . Paroxysmal atrial fibrillation   . Chronic anticoagulation   . Hyperlipidemia   . GERD (gastroesophageal reflux disease)   . Cancer   . Breast cancer     INTERIM HISTORY: has Hyperlipidemia; Chronic anticoagulation; Atrial fibrillation; Hypertension; Vitamin D deficiency; Breast cancer of upper-inner quadrant of left female breast; Paroxysmal atrial fibrillation; Encounter for therapeutic  drug monitoring; GERD (gastroesophageal reflux disease); and Bloating on her problem list.   Stage IA (T1b, N0, M0) invasive breast cancer, ER+ 99%, PR 13%, and Her2+, initially treated with weekly Taxol and Herceptin. She received 2 cycles of this regimen with intolerance to Paxil leading to its discontinuation. Now on Herceptin IV every 3 weeks which she will take for 52 week (finishing in December 2015). Anastrozole started on 06/28/2013.  ALLERGIES:  is allergic to iohexol and sulfa antibiotics.  MEDICATIONS: has a current medication list which includes the following prescription(s): anastrozole, atenolol, calcium carbonate, cetirizine, vitamin d, diltiazem, fluticasone, lorazepam, losartan, metoclopramide, pantoprazole, prochlorperazine, psyllium, simvastatin, triamcinolone cream, warfarin, and restora.  SURGICAL HISTORY:  Past Surgical History  Procedure Laterality Date  . Abdominal hysterectomy    . Cholecystectomy    . Partial mastectomy with axillary sentinel lymph node biopsy Left 02/23/2013    Procedure: LEFT PARTIAL MASTECTOMY WITH AXILLARY SIMPLE NODE BIOPSY, LEFT BREAST WIDE EXCISION;  Surgeon: Scherry Ran, MD;  Location: [facility];  Service: General;  Laterality: Left;  . Axillary lymph node dissection Left 02/23/2013    Procedure: LEFT AXILLARY LYMPH NODE DISSECTION;  Surgeon: Scherry Ran, MD;  Location: [facility];  Service: General;  Laterality: Left;  . Portacath placement Right 04/07/2013  . Portacath placement Right 04/07/2013    Procedure: INSERTION PORT-A-CATH;  Surgeon: Scherry Ran, MD;  Location: [facility];  Service: General;  Laterality: Right;  . Esophagogastroduodenoscopy  03/10/2002    XNT:ZGYFVC esophagus/small HH/Tiny AVM in antrum, otherwise normal stomach and D1 and D2/Status post passage of the [facility] dilator  . Colonoscopy  03/10/2002    RMR: Incomplete colonoscopy (sigmoidoscopy)/Normal rectum/Normal  colon to 40 cm.     FAMILY HISTORY: family  history includes Cancer in an other family member; Heart attack in her father; Heart disease in her mother; Leukemia in an other family member.  SOCIAL HISTORY:  reports that she has never smoked. She has never used smokeless tobacco. She reports that she does not drink alcohol or use illicit drugs.  REVIEW OF SYSTEMS:  Other than that discussed above is noncontributory.  PHYSICAL EXAMINATION: ECOG PERFORMANCE STATUS: 1 - Symptomatic but completely ambulatory  Blood pressure 123/98, pulse 74, temperature 98.2 F (36.8 C), temperature source Oral, resp. rate 18, weight 139 lb 6.4 oz (63.231 kg).  GENERAL:alert, no distress and comfortable. Mild alopecia. SKIN: skin color, texture, turgor are normal, no rashes or significant lesions EYES: PERLA; Conjunctiva are pink and non-injected, sclera clear SINUSES: No redness or tenderness over maxillary or ethmoid sinuses OROPHARYNX:no exudate, no erythema on lips, buccal mucosa, or tongue. NECK: supple, thyroid normal size, non-tender, without nodularity. No masses CHEST: Status post left mastectomy with no subcutaneous nodules. Right breast without mass. LYMPH:  no palpable lymphadenopathy in the cervical, axillary or inguinal LUNGS: clear to auscultation and percussion with normal breathing effort HEART: Irregularly irregular with no S3.. ABDOMEN:abdomen soft, non-tender and normal bowel sounds MUSCULOSKELETAL:no cyanosis of digits and no clubbing. Range of motion normal. No lymphedema NEURO: alert & oriented x 3 with fluent speech, no focal motor/sensory deficits   LABORATORY DATA: Anti-coag visit on 08/24/2013  Component Date Value Ref Range Status  . INR 08/24/2013 2.7   Final  Infusion on 08/17/2013  Component Date Value Ref Range Status  . WBC 08/17/2013 7.4  4.0 - 10.5 K/uL Final  . RBC 08/17/2013 4.21  3.87 - 5.11 MIL/uL Final  . Hemoglobin 08/17/2013 12.3  12.0 - 15.0 g/dL Final  . HCT 08/17/2013 38.9  36.0 - 46.0 % Final  . MCV  08/17/2013 92.4  78.0 - 100.0 fL Final  . MCH 08/17/2013 29.2  26.0 - 34.0 pg Final  . MCHC 08/17/2013 31.6  30.0 - 36.0 g/dL Final  . RDW 08/17/2013 14.0  11.5 - 15.5 % Final  . Platelets 08/17/2013 265  150 - 400 K/uL Final  . Neutrophils Relative % 08/17/2013 52  43 - 77 % Final  . Neutro Abs 08/17/2013 3.9  1.7 - 7.7 K/uL Final  . Lymphocytes Relative 08/17/2013 36  12 - 46 % Final  . Lymphs Abs 08/17/2013 2.7  0.7 - 4.0 K/uL Final  . Monocytes Relative 08/17/2013 10  3 - 12 % Final  . Monocytes Absolute 08/17/2013 0.8  0.1 - 1.0 K/uL Final  . Eosinophils Relative 08/17/2013 1  0 - 5 % Final  . Eosinophils Absolute 08/17/2013 0.1  0.0 - 0.7 K/uL Final  . Basophils Relative 08/17/2013 1  0 - 1 % Final  . Basophils Absolute 08/17/2013 0.0  0.0 - 0.1 K/uL Final  . Sodium 08/17/2013 141  137 - 147 mEq/L Final  . Potassium 08/17/2013 4.1  3.7 - 5.3 mEq/L Final  . Chloride 08/17/2013 103  96 - 112 mEq/L Final  . CO2 08/17/2013 25  19 - 32 mEq/L Final  . Glucose, Bld 08/17/2013 102* 70 - 99 mg/dL Final  . BUN 08/17/2013 10  6 - 23 mg/dL Final  . Creatinine, Ser 08/17/2013 0.78  0.50 - 1.10 mg/dL Final  . Calcium 08/17/2013 8.9  8.4 - 10.5 mg/dL Final  . Total Protein 08/17/2013 6.6  6.0 - 8.3 g/dL Final  .  Albumin 08/17/2013 3.8  3.5 - 5.2 g/dL Final  . AST 08/17/2013 17  0 - 37 U/L Final  . ALT 08/17/2013 12  0 - 35 U/L Final  . Alkaline Phosphatase 08/17/2013 51  39 - 117 U/L Final  . Total Bilirubin 08/17/2013 0.5  0.3 - 1.2 mg/dL Final  . GFR calc non Af Amer 08/17/2013 81* >90 mL/min Final  . GFR calc Af Amer 08/17/2013 >90  >90 mL/min Final   Comment: (NOTE)                          The eGFR has been calculated using the CKD EPI equation.                          This calculation has not been validated in all clinical situations.                          eGFR's persistently <90 mL/min signify possible Chronic Kidney                          Disease.  Anti-coag visit on  08/10/2013  Component Date Value Ref Range Status  . INR 08/10/2013 1.9   Final    PATHOLOGY: No new pathology.  Urinalysis    Component Value Date/Time   COLORURINE YELLOW 04/21/2013 [place] 04/21/2013 1044   LABSPEC 1.010 04/21/2013 1044   PHURINE 5.5 04/21/2013 1044   GLUCOSEU NEGATIVE 04/21/2013 1044   HGBUR TRACE* 04/21/2013 1044   BILIRUBINUR NEGATIVE 04/21/2013 1044   KETONESUR NEGATIVE 04/21/2013 1044   PROTEINUR NEGATIVE 04/21/2013 1044   UROBILINOGEN 0.2 04/21/2013 1044   NITRITE NEGATIVE 04/21/2013 1044   LEUKOCYTESUR TRACE* 04/21/2013 1044    RADIOGRAPHIC STUDIES: No results found.  ASSESSMENT:  #1. 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 Romona Curls, sentinel node biopsy, 0.7 cm, ER positive, PR positive, HER-2/neu overexpressed, tolerating Herceptin alone.  #2. Rhinitis, controlled the  #3. Paroxysmal atrial fibrillation, on long-term anticoagulation.  #4. Hypertension, controlled. #5. Symptomatic reflux disease.    PLAN:  #1. Continue anastrozole 1 mg daily. #2. Herceptin intravenously today to continue every 3 weeks until December 2015. #3. Use Maalox plus or Mylanta as needed while continuing on protonic 40 mg a day along with Reglan 10 mg 4 times a day before meals and at bedtime. #4. Followup with gastroenterology and 09/20/2013 for repeat endoscopy. #5. Office visit in 3 weeks with labs and continuation of Herceptin. #6. MUGA scan before her next treatment.   All questions were answered. The patient knows to call the clinic with any problems, questions or concerns. We can certainly see the patient much sooner if necessary.   I spent 25 minutes counseling the patient face to face. The total time spent in the appointment was 30 minutes.    Farrel Gobble, MD 09/07/2013 12:33 PM  DISCLAIMER:  This note was dictated with voice recognition software.   Similar sounding words can inadvertently be transcribed inaccurately and may not be corrected upon review.

## 2013-09-07 NOTE — Telephone Encounter (Signed)
Pt is aware.  

## 2013-09-08 MED ORDER — PANTOPRAZOLE SODIUM 40 MG PO TBEC
40.0000 mg | DELAYED_RELEASE_TABLET | Freq: Two times a day (BID) | ORAL | Status: DC
Start: 2013-09-08 — End: 2014-09-18

## 2013-09-08 NOTE — Addendum Note (Signed)
Addended by: Mahala Menghini on: 09/08/2013 05:11 PM   Modules accepted: Orders

## 2013-09-14 ENCOUNTER — Ambulatory Visit (INDEPENDENT_AMBULATORY_CARE_PROVIDER_SITE_OTHER): Payer: Medicare Other | Admitting: *Deleted

## 2013-09-14 DIAGNOSIS — Z7901 Long term (current) use of anticoagulants: Secondary | ICD-10-CM

## 2013-09-14 DIAGNOSIS — I4891 Unspecified atrial fibrillation: Secondary | ICD-10-CM

## 2013-09-14 DIAGNOSIS — Z5181 Encounter for therapeutic drug level monitoring: Secondary | ICD-10-CM

## 2013-09-14 LAB — POCT INR: INR: 2.1

## 2013-09-20 ENCOUNTER — Ambulatory Visit (INDEPENDENT_AMBULATORY_CARE_PROVIDER_SITE_OTHER): Payer: Medicare Other | Admitting: Internal Medicine

## 2013-09-20 ENCOUNTER — Encounter: Payer: Self-pay | Admitting: Internal Medicine

## 2013-09-20 ENCOUNTER — Encounter (HOSPITAL_COMMUNITY): Admission: RE | Admit: 2013-09-20 | Payer: Medicare Other | Source: Ambulatory Visit

## 2013-09-20 VITALS — BP 130/70 | HR 76 | Temp 98.0°F | Ht 61.0 in | Wt 140.0 lb

## 2013-09-20 DIAGNOSIS — K219 Gastro-esophageal reflux disease without esophagitis: Secondary | ICD-10-CM

## 2013-09-20 DIAGNOSIS — R1013 Epigastric pain: Secondary | ICD-10-CM

## 2013-09-20 DIAGNOSIS — K3189 Other diseases of stomach and duodenum: Secondary | ICD-10-CM

## 2013-09-20 NOTE — Progress Notes (Unsigned)
Per Dr. Gala Romney, I called in the Rx for Protonix to [facility] at [facility]. Protonix 40 mg bid #60 with 11 refills.

## 2013-09-20 NOTE — Progress Notes (Signed)
Primary Care Physician:  Krishawna Stiefel Bellow, MD Primary Gastroenterologist:  Dr. Gala Romney  Pre-Procedure History & Physical: HPI:  Stacie Huang is a 73 y.o. female here for followup of GERD/dyspepsia gas bloat. Doing much better on Protonix 40 mg twice daily. Restora  help with gas bloat symptoms significantly as well. She ran out samples. Apparently, she is tolerating her chemotherapy for breast cancer fairly well.  Takes Reglan 10 mg a.c. and at bedtime for chemotherapy associated nausea with good results.  Past Medical History  Diagnosis Date  . Hypertension   . Paroxysmal atrial fibrillation   . Chronic anticoagulation   . Hyperlipidemia   . GERD (gastroesophageal reflux disease)   . Cancer   . Breast cancer     Past Surgical History  Procedure Laterality Date  . Abdominal hysterectomy    . Cholecystectomy    . Partial mastectomy with axillary sentinel lymph node biopsy Left 02/23/2013    Procedure: LEFT PARTIAL MASTECTOMY WITH AXILLARY SIMPLE NODE BIOPSY, LEFT BREAST WIDE EXCISION;  Surgeon: Scherry Ran, MD;  Location: [facility];  Service: General;  Laterality: Left;  . Axillary lymph node dissection Left 02/23/2013    Procedure: LEFT AXILLARY LYMPH NODE DISSECTION;  Surgeon: Scherry Ran, MD;  Location: [facility];  Service: General;  Laterality: Left;  . Portacath placement Right 04/07/2013  . Portacath placement Right 04/07/2013    Procedure: INSERTION PORT-A-CATH;  Surgeon: Scherry Ran, MD;  Location: [facility];  Service: General;  Laterality: Right;  . Esophagogastroduodenoscopy  03/10/2002    AVW:[id number] esophagus/small HH/Tiny AVM in antrum, otherwise normal stomach and D1 and D2/Status post passage of the [facility] dilator  . Colonoscopy  03/10/2002    RMR: Incomplete colonoscopy (sigmoidoscopy)/Normal rectum/Normal colon to 40 cm.     Prior to Admission medications   Medication Sig Start Date End Date Taking? Authorizing Provider  anastrozole (ARIMIDEX) 1  MG tablet Take 1 tablet (1 mg total) by mouth daily. 08/10/13  Yes Manon Hilding Kefalas, PA-C  atenolol (TENORMIN) 100 MG tablet Take 50 mg by mouth daily before breakfast.    Yes Historical Provider, MD  calcium carbonate (OS-CAL) 600 MG TABS tablet Take 600 mg by mouth daily.   Yes Historical Provider, MD  cetirizine (ZYRTEC) 10 MG tablet Take 10 mg by mouth every evening.    Yes Historical Provider, MD  Cholecalciferol (VITAMIN D) 400 UNITS capsule Take 400 Units by mouth every morning.    Yes Historical Provider, MD  diltiazem (TIAZAC) 120 MG 24 hr capsule Take 1 capsule (120 mg total) by mouth every morning. 05/04/13  Yes Lendon Colonel, NP  fluticasone (FLONASE) 50 MCG/ACT nasal spray Place 2 sprays into the nose at bedtime.    Yes Historical Provider, MD  LORazepam (ATIVAN) 1 MG tablet Take 1 mg by mouth daily as needed for anxiety (AND/OR FOR SLEEP).    Yes Historical Provider, MD  losartan (COZAAR) 50 MG tablet Take 50 mg by mouth every morning.    Yes Historical Provider, MD  metoCLOPramide (REGLAN) 10 MG tablet Take 1 tablet (10 mg total) by mouth 4 (four) times daily -  before meals and at bedtime. 09/06/13  Yes Farrel Gobble, MD  pantoprazole (PROTONIX) 40 MG tablet Take 1 tablet (40 mg total) by mouth 2 (two) times daily before a meal. 09/08/13  Yes Mahala Menghini, PA-C  Probiotic Product (RESTORA) CAPS Take 1 capsule by mouth daily. 08/11/13  Yes Mahala Menghini, PA-C  prochlorperazine (  COMPAZINE) 10 MG tablet Take 1 tablet (10 mg total) by mouth 4 (four) times daily as needed for nausea or vomiting. 03/18/13  Yes Farrel Gobble, MD  psyllium (METAMUCIL) 58.6 % powder Take 1 packet by mouth 3 (three) times daily.   Yes Historical Provider, MD  simvastatin (ZOCOR) 10 MG tablet Take 10 mg by mouth at bedtime.     Yes Historical Provider, MD  triamcinolone cream (KENALOG) 0.1 % Apply 1 application topically at bedtime.   Yes Historical Provider, MD  warfarin (COUMADIN) 2.5 MG tablet Take  1.25-2.5 mg by mouth See admin instructions. Takes 2.5 mg (1 tablet) on Tuesday and Saturday takes 1.25 mg (0.5 tablet) all other days   Yes Historical Provider, MD    Allergies as of 09/20/2013 - Review Complete 09/20/2013  Allergen Reaction Noted  . Iohexol Hives 03/17/2008  . Sulfa antibiotics Rash 05/26/2011    Family History  Problem Relation Age of Onset  . Heart disease Mother     Also 80 of 9 siblings  . Heart attack Father   . Cancer      2 siblings  . Leukemia      History   Social History  . Marital Status: Legally Separated    Spouse Name: N/A    Number of Children:  2  . Years of Education: N/A   Occupational History  . Retired from [employer]  . Smoking status: Never Smoker   . Smokeless tobacco: Never Used  . Alcohol Use: No  . Drug Use: No  . Sexual Activity: Yes    Birth Control/ Protection: Surgical   Other Topics Concern  . Not on file   Social History Narrative   2 adult children    Review of Systems: See HPI, otherwise negative ROS  Physical Exam: BP 130/70  Pulse 76  Temp(Src) 98 F (36.7 C) (Oral)  Ht 5\' 1"  (1.549 m)  Wt 140 lb (63.504 kg)  BMI 26.47 kg/m2 General:   Alert,  Well-developed, well-nourished, pleasant and cooperative in NAD Skin:  Intact without significant lesions or rashes. Eyes:  Sclera clear, no icterus.   Conjunctiva pink. Ears:  Normal auditory acuity. Nose:  No deformity, discharge,  or lesions. Mouth:  No deformity or lesions. Neck:  Supple; no masses or thyromegaly. No significant cervical adenopathy. Lungs:  Clear throughout to auscultation.   No wheezes, crackles, or rhonchi. No acute distress. Heart:  Regular rate and rhythm; no murmurs, clicks, rubs,  or gallops. Abdomen: Non-distended, normal bowel sounds.  Soft and nontender without appreciable mass or hepatosplenomegaly.  Pulses:  Normal pulses noted. Extremities:  Without clubbing or edema.  Impression:   Pleasant 73 year old lady with breast cancer along with GERD/dyspepsia, gas bloat symptoms. All of her GI symptoms have improved on her current regimen. Takes Reglan, prescribed by the oncologist, for nausea related to chemotherapy.    Recommendations:   For now:  Continue Protonix 40 mg twice daily  Continue Reglan 5-10 mg 3x daily as needed GERD  Continue Restora daily  Office visit in 6 months      Notice: This dictation was prepared with Dragon dictation along with smaller phrase technology. Any transcriptional errors that result from this process are unintentional and may not be corrected upon review.

## 2013-09-20 NOTE — Progress Notes (Signed)
thanks

## 2013-09-20 NOTE — Patient Instructions (Signed)
Continue Protonix 40 mg twice daily  Continue Reglan 5-10 mg 3x daily as needed GERD  Continue Restora daily  Office visit in 6 months

## 2013-09-22 ENCOUNTER — Encounter (HOSPITAL_COMMUNITY)
Admission: RE | Admit: 2013-09-22 | Discharge: 2013-09-22 | Disposition: A | Discharge status: Home / Self Care | Payer: Medicare Other | Source: Ambulatory Visit | Attending: Oncology | Admitting: Oncology

## 2013-09-22 ENCOUNTER — Encounter (HOSPITAL_COMMUNITY): Payer: Self-pay

## 2013-09-22 DIAGNOSIS — Z7901 Long term (current) use of anticoagulants: Secondary | ICD-10-CM | POA: Insufficient documentation

## 2013-09-22 DIAGNOSIS — Z9221 Personal history of antineoplastic chemotherapy: Secondary | ICD-10-CM | POA: Diagnosis not present

## 2013-09-22 DIAGNOSIS — C50212 Malignant neoplasm of upper-inner quadrant of left female breast: Secondary | ICD-10-CM

## 2013-09-22 DIAGNOSIS — C50919 Malignant neoplasm of unspecified site of unspecified female breast: Secondary | ICD-10-CM | POA: Diagnosis present

## 2013-09-22 DIAGNOSIS — I10 Essential (primary) hypertension: Secondary | ICD-10-CM | POA: Insufficient documentation

## 2013-09-22 DIAGNOSIS — I4891 Unspecified atrial fibrillation: Secondary | ICD-10-CM | POA: Insufficient documentation

## 2013-09-22 DIAGNOSIS — Z79899 Other long term (current) drug therapy: Secondary | ICD-10-CM | POA: Insufficient documentation

## 2013-09-22 DIAGNOSIS — Z09 Encounter for follow-up examination after completed treatment for conditions other than malignant neoplasm: Secondary | ICD-10-CM | POA: Diagnosis not present

## 2013-09-22 MED ORDER — TECHNETIUM TC 99M-LABELED RED BLOOD CELLS IV KIT
25.0000 | PACK | Freq: Once | INTRAVENOUS | Status: AC | PRN
  Administered 2013-09-22: 25 via INTRAVENOUS

## 2013-09-22 MED ORDER — HEPARIN SOD (PORK) LOCK FLUSH 100 UNIT/ML IV SOLN
INTRAVENOUS | Status: AC
  Filled 2013-09-22: qty 5

## 2013-09-26 NOTE — Progress Notes (Signed)
Stacie Bellow, MD [address] [place] [address]  Breast cancer of upper-inner quadrant of left female breast - Plan: NM Cardiac [facility] Rest  CURRENT THERAPY:Herceptin intravenously every 3 weeks to complete one year of therapy which will be in December 2015, anastrozole 1 mg daily started 06/28/2013  INTERVAL HISTORY: Stacie Huang 73 y.o. 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  Now on Herceptin every 3 weeks alone with plans to finish treatment in December of 2015 giving the agent every 3 weeks with anastrozole started on 06/28/2013.    Breast cancer of upper-inner quadrant of left female breast   02/04/2013 Initial Diagnosis Breast cancer of upper-inner quadrant of left female breast   02/23/2013 Surgery left lumpectomy (UIQ), sentinel node biopsy--0.7cm, Node negative, ER+, PR+, HER-2/neu over expressed.   04/08/2013 - 04/14/2013 Chemotherapy Paclitaxel/Herceptin weekly x 12. Intolerance to Paclitaxel and only receiving 2 cycles   05/04/2013 -  Chemotherapy Herceptin every 21 days to complete 1 year worth   06/28/2013 -  Chemotherapy Anastrazole started   I personally reviewed and went over laboratory results with the patient.  The results are noted within this dictation.  She recently saw Dr. Gala Romney who made the following recommendations: Continue Protonix 40 mg twice daily  Continue Reglan 5-10 mg 3x daily as needed GERD  Continue Restora daily  Office visit in 6 months  She has a few non-oncology complaints: 1. "Fluid in my ear"- this is better today 2. "Vertigo-like" symptoms this AM but none since. 3. Intermittent right rib pain since last MUGA scan-  It is not bothering her presently.  Palpation does not reproduce the pain.  The pain spontaneously resolves on its own  after a short time without intervention.  It is fleeting in nature.  Irreproducible.  Oncologically, she is tolerating her AI therapy and Herceptin.  She denies any oncology complaints and ROS questioning is negative.     Past Medical History  Diagnosis Date  . Hypertension   . Paroxysmal atrial fibrillation   . Chronic anticoagulation   . Hyperlipidemia   . GERD (gastroesophageal reflux disease)   . Cancer   . Breast cancer     has Hyperlipidemia; Chronic anticoagulation; Atrial fibrillation; Hypertension; Vitamin D deficiency; Breast cancer of upper-inner quadrant of left female breast; Paroxysmal atrial fibrillation; Encounter for therapeutic drug monitoring; GERD (gastroesophageal reflux disease); and Bloating on her problem list.     is allergic to iohexol and sulfa antibiotics.  [name] does not currently have medications on file.  Past Surgical History  Procedure Laterality Date  . Abdominal hysterectomy    . Cholecystectomy    . Partial mastectomy with axillary sentinel lymph node biopsy Left 02/23/2013    Procedure: LEFT PARTIAL MASTECTOMY WITH AXILLARY SIMPLE NODE BIOPSY, LEFT BREAST WIDE EXCISION;  Surgeon: Scherry Ran, MD;  Location: [facility];  Service: General;  Laterality: Left;  . Axillary lymph node dissection Left 02/23/2013    Procedure: LEFT AXILLARY LYMPH NODE DISSECTION;  Surgeon: Scherry Ran, MD;  Location: [facility];  Service: General;  Laterality: Left;  . Portacath placement Right 04/07/2013  . Portacath placement Right 04/07/2013    Procedure: INSERTION PORT-A-CATH;  Surgeon: Scherry Ran, MD;  Location: [facility];  Service: General;  Laterality: Right;  . Esophagogastroduodenoscopy  03/10/2002    KGY:JEHUDJ esophagus/small HH/Tiny AVM in antrum, otherwise normal stomach and D1 and D2/Status post passage of the [facility] dilator  . Colonoscopy  03/10/2002    RMR: Incomplete colonoscopy (sigmoidoscopy)/Normal rectum/Normal colon to 40 cm.      Denies any headaches, dizziness, double vision, fevers, chills, night sweats, nausea, vomiting, diarrhea, constipation, chest pain, heart palpitations, shortness of breath, blood in stool, black tarry stool, urinary pain, urinary burning, urinary frequency, hematuria.   PHYSICAL EXAMINATION  ECOG PERFORMANCE STATUS: 1 - Symptomatic but completely ambulatory  There were no vitals filed for this visit.  GENERAL:alert, no distress, well nourished, well developed, comfortable, cooperative and smiling SKIN: skin color, texture, turgor are normal, no rashes or significant lesions HEAD: Normocephalic, No masses, lesions, tenderness or abnormalities EYES: normal, PERRLA, EOMI, Conjunctiva are pink and non-injected EARS: External ears normal OROPHARYNX:mucous membranes are moist  NECK: supple, no adenopathy, trachea midline LYMPH:  no palpable lymphadenopathy BREAST:not examined LUNGS: clear to auscultation  HEART: regular rate & rhythm, no murmurs and no gallops ABDOMEN:abdomen soft and normal bowel sounds BACK: Back symmetric, no curvature. EXTREMITIES:less then 2 second capillary refill, no joint deformities, effusion, or inflammation, no skin discoloration, no clubbing, no cyanosis  NEURO: alert & oriented x 3 with fluent speech, no focal motor/sensory deficits, gait normal    LABORATORY DATA: CBC    Component Value Date/Time   WBC 8.8 09/07/2013 1150   RBC 4.42 09/07/2013 1150   HGB 12.9 09/07/2013 1150   HCT 39.6 09/07/2013 1150   PLT 296 09/07/2013 1150   MCV 89.6 09/07/2013 1150   MCH 29.2 09/07/2013 1150   MCHC 32.6 09/07/2013 1150   RDW 13.7 09/07/2013 1150   LYMPHSABS 3.3 09/07/2013 1150   MONOABS 0.8 09/07/2013 1150   EOSABS 0.0 09/07/2013 1150   BASOSABS 0.0 09/07/2013 1150      Chemistry      Component Value Date/Time   NA 142 09/07/2013 1150   K 4.4 09/07/2013 1150   CL 104 09/07/2013 1150   CO2 25 09/07/2013 1150   BUN 14 09/07/2013 1150   CREATININE 0.86 09/07/2013  1150      Component Value Date/Time   CALCIUM 9.7 09/07/2013 1150   ALKPHOS 54 09/07/2013 1150   AST 17 09/07/2013 1150   ALT 12 09/07/2013 1150   BILITOT 0.7 09/07/2013 1150        RADIOGRAPHIC STUDIES:  09/22/2013  CLINICAL DATA: Breast cancer and need for monitoring of cardiac  function on chemotherapy.  EXAM:  NUCLEAR MEDICINE CARDIAC BLOOD POOL IMAGING (MUGA)  TECHNIQUE:  Cardiac multi-gated acquisition was performed at rest following  intravenous injection of Tc-62mlabeled red blood cells.  RADIOPHARMACEUTICALS: 25 MCiTc-9102mn-vitro labeled red blood  cells.  COMPARISON: 06/28/2013  FINDINGS:  The quantitative left ventricular ejection fraction is 53% (previous  calculation 59%). Cine evaluation shows grossly normal wall motion.  IMPRESSION:  Normal left ventricular function with quantitative ejection fraction  of 53%.  Electronically Signed  By: GlAletta Edouard.D.  On: 09/22/2013 15:17     ASSESSMENT:  1. 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 BrRomona Curlssentinel node biopsy, 0.7 cm, ER positive, PR positive, HER-2/neu overexpressed, tolerating Herceptin alone.  2. Rhinitis, controlled  3. Paroxysmal atrial fibrillation, on long-term anticoagulation.  4. Hypertension, controlled.  5. Symptomatic reflux disease. 6. Normal bone density on 08/03/2013. Repeat bone density in April 2017.   Patient  Active Problem List   Diagnosis Date Noted  . GERD (gastroesophageal reflux disease) 08/11/2013  . Bloating 08/11/2013  . Encounter for therapeutic drug monitoring 06/17/2013  . Paroxysmal atrial fibrillation 03/17/2013  . Vitamin D deficiency 03/16/2013  . Breast cancer of upper-inner quadrant of left female breast 03/16/2013  . Atrial fibrillation   . Hypertension   . Chronic anticoagulation 11/11/2010  . Hyperlipidemia 02/26/2010     PLAN:  1. I personally reviewed and went over  laboratory results with the patient.  The results are noted within this dictation. 2. Continue anastrozole 1 mg daily 3. Herceptin intravenously today and continued every 3 weeks until December 2015. 4. Follow-up with GI (Dr. Gala Romney) as directed 5. Follow-up with PCP as directed 6. MUGA in 3 months to evaluate EF since she is on Herceptin. 7. Pre-chemo labs: CBC diff, CMET every 6 weeks  8. Return in 6 weeks for follow-up    THERAPY PLAN:  She is to continue with therapy as planned.   All questions were answered. The patient knows to call the clinic with any problems, questions or concerns. We can certainly see the patient much sooner if necessary.  Patient and plan discussed with Dr. Farrel Gobble and he is in agreement with the aforementioned.   [facility] 09/28/2013

## 2013-09-28 ENCOUNTER — Encounter (HOSPITAL_BASED_OUTPATIENT_CLINIC_OR_DEPARTMENT_OTHER): Payer: Medicare Other | Admitting: Oncology

## 2013-09-28 ENCOUNTER — Ambulatory Visit (HOSPITAL_COMMUNITY): Payer: Medicare Other

## 2013-09-28 ENCOUNTER — Encounter (HOSPITAL_COMMUNITY): Payer: Medicare Other | Attending: Hematology and Oncology

## 2013-09-28 VITALS — BP 137/72 | HR 67 | Temp 97.4°F | Resp 20 | Wt 140.4 lb

## 2013-09-28 DIAGNOSIS — Z5112 Encounter for antineoplastic immunotherapy: Secondary | ICD-10-CM

## 2013-09-28 DIAGNOSIS — C50219 Malignant neoplasm of upper-inner quadrant of unspecified female breast: Secondary | ICD-10-CM | POA: Insufficient documentation

## 2013-09-28 DIAGNOSIS — Z170 Estrogen receptor positive status [ER+]: Secondary | ICD-10-CM

## 2013-09-28 DIAGNOSIS — C50212 Malignant neoplasm of upper-inner quadrant of left female breast: Secondary | ICD-10-CM

## 2013-09-28 DIAGNOSIS — I10 Essential (primary) hypertension: Secondary | ICD-10-CM

## 2013-09-28 MED ORDER — ACETAMINOPHEN 325 MG PO TABS
650.0000 mg | ORAL_TABLET | Freq: Once | ORAL | Status: AC
  Administered 2013-09-28: 650 mg via ORAL

## 2013-09-28 MED ORDER — HEPARIN SOD (PORK) LOCK FLUSH 100 UNIT/ML IV SOLN
500.0000 [IU] | Freq: Once | INTRAVENOUS | Status: AC | PRN
  Administered 2013-09-28: 500 [IU]
  Filled 2013-09-28: qty 5

## 2013-09-28 MED ORDER — TRASTUZUMAB CHEMO INJECTION 440 MG
6.0000 mg/kg | Freq: Once | INTRAVENOUS | Status: AC
  Administered 2013-09-28: 420 mg via INTRAVENOUS
  Filled 2013-09-28: qty 20

## 2013-09-28 MED ORDER — SODIUM CHLORIDE 0.9 % IJ SOLN: 10.0000 mL | INTRAMUSCULAR | Status: DC | PRN

## 2013-09-28 MED ORDER — ACETAMINOPHEN 325 MG PO TABS
ORAL_TABLET | ORAL | Status: AC
  Filled 2013-09-28: qty 2

## 2013-09-28 MED ORDER — DIPHENHYDRAMINE HCL 25 MG PO CAPS
ORAL_CAPSULE | ORAL | Status: AC
  Filled 2013-09-28: qty 2

## 2013-09-28 MED ORDER — DIPHENHYDRAMINE HCL 25 MG PO CAPS
50.0000 mg | ORAL_CAPSULE | Freq: Once | ORAL | Status: AC
  Administered 2013-09-28: 50 mg via ORAL

## 2013-09-28 MED ORDER — SODIUM CHLORIDE 0.9 % IV SOLN
Freq: Once | INTRAVENOUS | Status: AC
  Administered 2013-09-28: 13:00:00 via INTRAVENOUS

## 2013-09-28 NOTE — Progress Notes (Signed)
Tolerated treatment well.  Alert, in no distress.  Left in c/o friend for transport home.

## 2013-09-28 NOTE — Patient Instructions (Signed)
[facility] Discharge Instructions  RECOMMENDATIONS MADE BY THE CONSULTANT AND ANY TEST RESULTS WILL BE SENT TO YOUR REFERRING PHYSICIAN.  You will return in 3 weeks for your treatment. You will see the doctor in 6 weeks. Continue your Anastrozole. [facility]uga scan in 3 months.  Thank you for choosing [facility] to provide your oncology and hematology care.  To afford each patient quality time with our providers, please arrive at least 15 minutes before your scheduled appointment time.  With your help, our goal is to use those 15 minutes to complete the necessary work-up to ensure our physicians have the information they need to help with your evaluation and healthcare recommendations.    Effective January 1st, 2014, we ask that you re-schedule your appointment with our physicians should you arrive 10 or more minutes late for your appointment.  We strive to give you quality time with our providers, and arriving late affects you and other patients whose appointments are after yours.    Again, thank you for choosing [facility].  Our hope is that these requests will decrease the amount of time that you wait before being seen by our physicians.       _____________________________________________________________  Should you have questions after your visit to [facility], please contact our office at [phone] between the hours of 8:30 a.m. and 5:00 p.m.  Voicemails left after 4:30 p.m. will not be returned until the following business day.  For prescription refill requests, have your pharmacy contact our office with your prescription refill request.

## 2013-10-05 ENCOUNTER — Ambulatory Visit (INDEPENDENT_AMBULATORY_CARE_PROVIDER_SITE_OTHER): Payer: Medicare Other | Admitting: *Deleted

## 2013-10-05 DIAGNOSIS — Z7901 Long term (current) use of anticoagulants: Secondary | ICD-10-CM

## 2013-10-05 DIAGNOSIS — Z5181 Encounter for therapeutic drug level monitoring: Secondary | ICD-10-CM

## 2013-10-05 DIAGNOSIS — I4891 Unspecified atrial fibrillation: Secondary | ICD-10-CM

## 2013-10-05 LAB — POCT INR: INR: 2.4

## 2013-10-05 MED ORDER — WARFARIN SODIUM 2.5 MG PO TABS: ORAL_TABLET | ORAL | Status: DC

## 2013-10-07 ENCOUNTER — Telehealth: Payer: Self-pay | Admitting: Internal Medicine

## 2013-10-07 NOTE — Telephone Encounter (Signed)
Pt called asking if we had any Restora samples for her. I told her that they were OTC, but she said that RMR told her that he would give her some samples since they were too expensive for her to buy. I told her that JL was out at the moment and she can check on this and call her back. Pt is leaving to go see her PCP and I told her it might be Monday then. [phone]

## 2013-10-10 NOTE — Telephone Encounter (Signed)
We do not have any restora right now. Called and informed pt.

## 2013-10-18 ENCOUNTER — Telehealth: Payer: Self-pay

## 2013-10-18 NOTE — Telephone Encounter (Signed)
Pt called for Restora samples and we just got some in today. #[phone] given to pt.

## 2013-10-19 ENCOUNTER — Encounter (HOSPITAL_BASED_OUTPATIENT_CLINIC_OR_DEPARTMENT_OTHER): Payer: Medicare Other

## 2013-10-19 VITALS — BP 125/73 | HR 79 | Temp 98.1°F | Resp 18 | Wt 140.8 lb

## 2013-10-19 DIAGNOSIS — Z5112 Encounter for antineoplastic immunotherapy: Secondary | ICD-10-CM

## 2013-10-19 DIAGNOSIS — C50212 Malignant neoplasm of upper-inner quadrant of left female breast: Secondary | ICD-10-CM

## 2013-10-19 DIAGNOSIS — C50219 Malignant neoplasm of upper-inner quadrant of unspecified female breast: Secondary | ICD-10-CM

## 2013-10-19 LAB — CBC WITH DIFFERENTIAL/PLATELET
Basophils Absolute: 0 10*3/uL (ref 0.0–0.1)
Basophils Relative: 0 % (ref 0–1)
EOS ABS: 0.1 10*3/uL (ref 0.0–0.7)
EOS PCT: 1 % (ref 0–5)
HCT: 37.5 % (ref 36.0–46.0)
Hemoglobin: 12.2 g/dL (ref 12.0–15.0)
LYMPHS ABS: 2.3 10*3/uL (ref 0.7–4.0)
LYMPHS PCT: 31 % (ref 12–46)
MCH: 29.5 pg (ref 26.0–34.0)
MCHC: 32.5 g/dL (ref 30.0–36.0)
MCV: 90.8 fL (ref 78.0–100.0)
MONOS PCT: 13 % — AB (ref 3–12)
Monocytes Absolute: 0.9 10*3/uL (ref 0.1–1.0)
Neutro Abs: 4 10*3/uL (ref 1.7–7.7)
Neutrophils Relative %: 55 % (ref 43–77)
PLATELETS: 288 10*3/uL (ref 150–400)
RBC: 4.13 MIL/uL (ref 3.87–5.11)
RDW: 14.2 % (ref 11.5–15.5)
WBC: 7.3 10*3/uL (ref 4.0–10.5)

## 2013-10-19 LAB — COMPREHENSIVE METABOLIC PANEL
ALT: 12 U/L (ref 0–35)
AST: 16 U/L (ref 0–37)
Albumin: 3.7 g/dL (ref 3.5–5.2)
Alkaline Phosphatase: 53 U/L (ref 39–117)
BUN: 14 mg/dL (ref 6–23)
CO2: 27 meq/L (ref 19–32)
Calcium: 9.2 mg/dL (ref 8.4–10.5)
Chloride: 101 mEq/L (ref 96–112)
Creatinine, Ser: 0.83 mg/dL (ref 0.50–1.10)
GFR, EST AFRICAN AMERICAN: 79 mL/min — AB (ref >90)
GFR, EST NON AFRICAN AMERICAN: 68 mL/min — AB (ref >90)
GLUCOSE: 97 mg/dL (ref 70–99)
Potassium: 4.1 mEq/L (ref 3.7–5.3)
SODIUM: 138 meq/L (ref 137–147)
TOTAL PROTEIN: 6.8 g/dL (ref 6.0–8.3)
Total Bilirubin: 0.6 mg/dL (ref 0.3–1.2)

## 2013-10-19 MED ORDER — DIPHENHYDRAMINE HCL 25 MG PO CAPS
50.0000 mg | ORAL_CAPSULE | Freq: Once | ORAL | Status: AC
  Administered 2013-10-19: 50 mg via ORAL

## 2013-10-19 MED ORDER — TRASTUZUMAB CHEMO INJECTION 440 MG
6.0000 mg/kg | Freq: Once | INTRAVENOUS | Status: AC
  Administered 2013-10-19: 420 mg via INTRAVENOUS
  Filled 2013-10-19: qty 20

## 2013-10-19 MED ORDER — ACETAMINOPHEN 325 MG PO TABS
ORAL_TABLET | ORAL | Status: AC
  Filled 2013-10-19: qty 2

## 2013-10-19 MED ORDER — HEPARIN SOD (PORK) LOCK FLUSH 100 UNIT/ML IV SOLN
500.0000 [IU] | Freq: Once | INTRAVENOUS | Status: AC | PRN
  Administered 2013-10-19: 500 [IU]
  Filled 2013-10-19: qty 5

## 2013-10-19 MED ORDER — ACETAMINOPHEN 325 MG PO TABS
650.0000 mg | ORAL_TABLET | Freq: Once | ORAL | Status: AC
  Administered 2013-10-19: 650 mg via ORAL

## 2013-10-19 MED ORDER — SODIUM CHLORIDE 0.9 % IJ SOLN
10.0000 mL | INTRAMUSCULAR | Status: DC | PRN
  Administered 2013-10-19: 10 mL

## 2013-10-19 MED ORDER — SODIUM CHLORIDE 0.9 % IV SOLN
Freq: Once | INTRAVENOUS | Status: AC
  Administered 2013-10-19: 10:00:00 via INTRAVENOUS

## 2013-10-19 MED ORDER — DIPHENHYDRAMINE HCL 25 MG PO CAPS
ORAL_CAPSULE | ORAL | Status: AC
  Filled 2013-10-19: qty 2

## 2013-10-19 MED ORDER — EPOETIN ALFA 2000 UNIT/ML IJ SOLN: 2000.0000 [IU] | Freq: Once | INTRAMUSCULAR | Status: DC

## 2013-10-19 NOTE — Patient Instructions (Signed)
[facility] Discharge Instructions for Patients Receiving Chemotherapy  Today you received the following chemotherapy agents Herceptin. Keep all appointments as scheduled. Report any issues/concerns to clinic as needed prior to appointments.  If you develop nausea and vomiting that is not controlled by your nausea medication, call the clinic. If it is after clinic hours your family physician or the after hours number for the clinic or go to the Emergency Department.   BELOW ARE SYMPTOMS THAT SHOULD BE REPORTED IMMEDIATELY:  *FEVER GREATER THAN 101.0 F  *CHILLS WITH OR WITHOUT FEVER  NAUSEA AND VOMITING THAT IS NOT CONTROLLED WITH YOUR NAUSEA MEDICATION  *UNUSUAL SHORTNESS OF BREATH  *UNUSUAL BRUISING OR BLEEDING  TENDERNESS IN MOUTH AND THROAT WITH OR WITHOUT PRESENCE OF ULCERS  *URINARY PROBLEMS  *BOWEL PROBLEMS  UNUSUAL RASH Items with * indicate a potential emergency and should be followed up as soon as possible.  One of the nurses will contact you 24 hours after your treatment. Please let the nurse know about any problems that you may have experienced. Feel free to call the clinic you have any questions or concerns. The clinic phone number is [phone].   I have been informed and understand all the instructions given to me. I know to contact the clinic, my physician, or go to the Emergency Department if any problems should occur. I do not have any questions at this time, but understand that I may call the clinic during office hours or the Patient Navigator at [phone] should I have any questions or need assistance in obtaining follow up care.    __________________________________________  _____________  __________ Signature of Patient or Authorized Representative            Date                   Time    __________________________________________ Nurse's Signature

## 2013-10-19 NOTE — Telephone Encounter (Signed)
Great!

## 2013-11-02 ENCOUNTER — Ambulatory Visit (INDEPENDENT_AMBULATORY_CARE_PROVIDER_SITE_OTHER): Payer: Medicare Other | Admitting: *Deleted

## 2013-11-02 DIAGNOSIS — Z7901 Long term (current) use of anticoagulants: Secondary | ICD-10-CM

## 2013-11-02 DIAGNOSIS — Z5181 Encounter for therapeutic drug level monitoring: Secondary | ICD-10-CM

## 2013-11-02 DIAGNOSIS — I4891 Unspecified atrial fibrillation: Secondary | ICD-10-CM

## 2013-11-02 LAB — POCT INR: INR: 2.3

## 2013-11-02 MED ORDER — DILTIAZEM HCL ER BEADS 120 MG PO CP24: 120.0000 mg | ORAL_CAPSULE | Freq: Every morning | ORAL | Status: DC

## 2013-11-07 NOTE — Progress Notes (Signed)
Stacie Bellow, MD [address] [place] [address]  Breast cancer of upper-inner quadrant of left female breast  CURRENT THERAPY: Herceptin intravenously every 3 weeks to complete one year of therapy which will be in December 2015, anastrozole 1 mg daily started 06/28/2013   INTERVAL HISTORY: Stacie Huang 73 y.o. 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 Now on Herceptin every 3 weeks alone with plans to finish treatment in December of 2015 giving the agent every 3 weeks with anastrozole started on 06/28/2013.    Breast cancer of upper-inner quadrant of left female breast   02/04/2013 Initial Diagnosis Breast cancer of upper-inner quadrant of left female breast   02/23/2013 Surgery left lumpectomy (UIQ), sentinel node biopsy--0.7cm, Node negative, ER+, PR+, HER-2/neu over expressed.   04/08/2013 - 04/14/2013 Chemotherapy Paclitaxel/Herceptin weekly x 12. Intolerance to Paclitaxel and only receiving 2 cycles   05/04/2013 -  Chemotherapy Herceptin every 21 days to complete 1 year worth   06/28/2013 -  Chemotherapy Anastrazole started   I personally reviewed and went over laboratory results with the patient.  The results are noted within this dictation.   She is tolerating therapy well thus far.  She denies any oncology complaints and ROS questioning is negative.  Past Medical History  Diagnosis Date  . Hypertension   . Paroxysmal atrial fibrillation   . Chronic anticoagulation   . Hyperlipidemia   . GERD (gastroesophageal reflux disease)   . Cancer   . Breast cancer     has Hyperlipidemia; Chronic anticoagulation; Atrial fibrillation; Hypertension; Vitamin D deficiency; Breast cancer of upper-inner quadrant of left female breast; Paroxysmal atrial fibrillation; Encounter  for therapeutic drug monitoring; GERD (gastroesophageal reflux disease); and Bloating on her problem list.     is allergic to iohexol and sulfa antibiotics.  Ms. Gignac does not currently have medications on file.  Past Surgical History  Procedure Laterality Date  . Abdominal hysterectomy    . Cholecystectomy    . Partial mastectomy with axillary sentinel lymph node biopsy Left 02/23/2013    Procedure: LEFT PARTIAL MASTECTOMY WITH AXILLARY SIMPLE NODE BIOPSY, LEFT BREAST WIDE EXCISION;  Surgeon: Scherry Ran, MD;  Location: [facility];  Service: General;  Laterality: Left;  . Axillary lymph node dissection Left 02/23/2013    Procedure: LEFT AXILLARY LYMPH NODE DISSECTION;  Surgeon: Scherry Ran, MD;  Location: [facility];  Service: General;  Laterality: Left;  . Portacath placement Right 04/07/2013  . Portacath placement Right 04/07/2013    Procedure: INSERTION PORT-A-CATH;  Surgeon: Scherry Ran, MD;  Location: [facility];  Service: General;  Laterality: Right;  . Esophagogastroduodenoscopy  03/10/2002    MAU:QJFHLK esophagus/small HH/Tiny AVM in antrum, otherwise normal stomach and D1 and D2/Status post passage of the [facility] dilator  . Colonoscopy  03/10/2002    RMR: Incomplete colonoscopy (sigmoidoscopy)/Normal rectum/Normal colon to 40 cm.     Denies any headaches, dizziness, double vision, fevers, chills, night sweats, nausea, vomiting, diarrhea, constipation, chest pain, heart palpitations, shortness of breath, blood in stool, black tarry stool, urinary pain, urinary burning, urinary frequency, hematuria.   PHYSICAL EXAMINATION  ECOG PERFORMANCE STATUS: 1 - Symptomatic but completely ambulatory  There were no vitals filed for this visit.  GENERAL:alert, no distress, well nourished, well developed, comfortable, cooperative and smiling SKIN: skin  color, texture, turgor are normal, no rashes or significant lesions HEAD: Normocephalic, No masses, lesions, tenderness or  abnormalities EYES: normal, PERRLA, EOMI, Conjunctiva are pink and non-injected EARS: External ears normal OROPHARYNX:mucous membranes are moist  NECK: supple, thyroid normal size, non-tender, without nodularity, no stridor, non-tender, trachea midline LYMPH:  not examined BREAST:not examined LUNGS: not examined HEART: not examined ABDOMEN:abdomen soft and non-tender BACK: Back symmetric, no curvature. EXTREMITIES:less then 2 second capillary refill, no joint deformities, effusion, or inflammation, no cyanosis  NEURO: alert & oriented x 3 with fluent speech, no focal motor/sensory deficits, gait normal   LABORATORY DATA: CBC    Component Value Date/Time   WBC 7.3 10/19/2013 1003   RBC 4.13 10/19/2013 1003   HGB 12.2 10/19/2013 1003   HCT 37.5 10/19/2013 1003   PLT 288 10/19/2013 1003   MCV 90.8 10/19/2013 1003   MCH 29.5 10/19/2013 1003   MCHC 32.5 10/19/2013 1003   RDW 14.2 10/19/2013 1003   LYMPHSABS 2.3 10/19/2013 1003   MONOABS 0.9 10/19/2013 1003   EOSABS 0.1 10/19/2013 1003   BASOSABS 0.0 10/19/2013 1003      Chemistry      Component Value Date/Time   NA 138 10/19/2013 1003   K 4.1 10/19/2013 1003   CL 101 10/19/2013 1003   CO2 27 10/19/2013 1003   BUN 14 10/19/2013 1003   CREATININE 0.83 10/19/2013 1003      Component Value Date/Time   CALCIUM 9.2 10/19/2013 1003   ALKPHOS 53 10/19/2013 1003   AST 16 10/19/2013 1003   ALT 12 10/19/2013 1003   BILITOT 0.6 10/19/2013 1003        ASSESSMENT:  1. 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 Romona Curls, sentinel node biopsy, 0.7 cm, ER positive, PR positive, HER-2/neu overexpressed, tolerating Herceptin alone.  2. Rhinitis, controlled  3. Paroxysmal atrial fibrillation, on long-term anticoagulation.  4. Hypertension, controlled.  5. Symptomatic reflux disease.  6. Normal bone density on 08/03/2013. Repeat bone density in April 2017.  Patient Active Problem  List   Diagnosis Date Noted  . GERD (gastroesophageal reflux disease) 08/11/2013  . Bloating 08/11/2013  . Encounter for therapeutic drug monitoring 06/17/2013  . Paroxysmal atrial fibrillation 03/17/2013  . Vitamin D deficiency 03/16/2013  . Breast cancer of upper-inner quadrant of left female breast 03/16/2013  . Atrial fibrillation   . Hypertension   . Chronic anticoagulation 11/11/2010  . Hyperlipidemia 02/26/2010     PLAN:  1. I personally reviewed and went over laboratory results with the patient. The results are noted within this dictation.  2. Continue anastrozole 1 mg daily  3. Herceptin intravenously today and continued every 3 weeks until December 2015.  4. Follow-up with GI (Dr. Gala Romney) as directed  5. Follow-up with PCP as directed  6. MUGA as scheduled to evaluate EF since she is on Herceptin.  7. Pre-chemo labs: CBC diff, CMET every 6 weeks  8. Return in 6 weeks for follow-up   THERAPY PLAN:  She is to continue with therapy as planned.   All questions were answered. The patient knows to call the clinic with any problems, questions or concerns. We can certainly see the patient much sooner if necessary.  Patient and plan discussed with Dr. Farrel Gobble and he is in agreement with the aforementioned.    KEFALAS,THOMAS 11/09/2013

## 2013-11-09 ENCOUNTER — Encounter (HOSPITAL_COMMUNITY): Payer: Medicare Other | Attending: Hematology and Oncology

## 2013-11-09 ENCOUNTER — Encounter (HOSPITAL_BASED_OUTPATIENT_CLINIC_OR_DEPARTMENT_OTHER): Payer: Medicare Other | Admitting: Oncology

## 2013-11-09 VITALS — BP 137/72 | HR 62 | Temp 97.7°F | Resp 18 | Wt 137.8 lb

## 2013-11-09 DIAGNOSIS — C50219 Malignant neoplasm of upper-inner quadrant of unspecified female breast: Secondary | ICD-10-CM

## 2013-11-09 DIAGNOSIS — Z170 Estrogen receptor positive status [ER+]: Secondary | ICD-10-CM

## 2013-11-09 DIAGNOSIS — I4891 Unspecified atrial fibrillation: Secondary | ICD-10-CM

## 2013-11-09 DIAGNOSIS — Z5112 Encounter for antineoplastic immunotherapy: Secondary | ICD-10-CM

## 2013-11-09 DIAGNOSIS — C50212 Malignant neoplasm of upper-inner quadrant of left female breast: Secondary | ICD-10-CM

## 2013-11-09 MED ORDER — HEPARIN SOD (PORK) LOCK FLUSH 100 UNIT/ML IV SOLN
500.0000 [IU] | Freq: Once | INTRAVENOUS | Status: AC | PRN
  Administered 2013-11-09: 500 [IU]

## 2013-11-09 MED ORDER — HEPARIN SOD (PORK) LOCK FLUSH 100 UNIT/ML IV SOLN
INTRAVENOUS | Status: AC
  Filled 2013-11-09: qty 5

## 2013-11-09 MED ORDER — DIPHENHYDRAMINE HCL 25 MG PO CAPS
50.0000 mg | ORAL_CAPSULE | Freq: Once | ORAL | Status: AC
  Administered 2013-11-09: 50 mg via ORAL

## 2013-11-09 MED ORDER — ACETAMINOPHEN 325 MG PO TABS
ORAL_TABLET | ORAL | Status: AC
  Filled 2013-11-09: qty 2

## 2013-11-09 MED ORDER — SODIUM CHLORIDE 0.9 % IJ SOLN
10.0000 mL | INTRAMUSCULAR | Status: DC | PRN
  Administered 2013-11-09: 10 mL

## 2013-11-09 MED ORDER — ACETAMINOPHEN 325 MG PO TABS
650.0000 mg | ORAL_TABLET | Freq: Once | ORAL | Status: AC
  Administered 2013-11-09: 650 mg via ORAL

## 2013-11-09 MED ORDER — DIPHENHYDRAMINE HCL 25 MG PO CAPS
ORAL_CAPSULE | ORAL | Status: AC
  Filled 2013-11-09: qty 2

## 2013-11-09 MED ORDER — SODIUM CHLORIDE 0.9 % IV SOLN
Freq: Once | INTRAVENOUS | Status: AC
  Administered 2013-11-09: 11:00:00 via INTRAVENOUS

## 2013-11-09 MED ORDER — TRASTUZUMAB CHEMO INJECTION 440 MG
6.0000 mg/kg | Freq: Once | INTRAVENOUS | Status: AC
  Administered 2013-11-09: 420 mg via INTRAVENOUS
  Filled 2013-11-09: qty 20

## 2013-11-09 NOTE — Patient Instructions (Addendum)
[facility] Discharge Instructions  RECOMMENDATIONS MADE BY THE CONSULTANT AND ANY TEST RESULTS WILL BE SENT TO YOUR REFERRING PHYSICIAN.  MUGA scan as scheduled. Continue your chemotherapy as scheduled.  Please cal for any questions or concerns.  Thank you for choosing [facility] to provide your oncology and hematology care.  To afford each patient quality time with our providers, please arrive at least 15 minutes before your scheduled appointment time.  With your help, our goal is to use those 15 minutes to complete the necessary work-up to ensure our physicians have the information they need to help with your evaluation and healthcare recommendations.    Effective January 1st, 2014, we ask that you re-schedule your appointment with our physicians should you arrive 10 or more minutes late for your appointment.  We strive to give you quality time with our providers, and arriving late affects you and other patients whose appointments are after yours.    Again, thank you for choosing [facility].  Our hope is that these requests will decrease the amount of time that you wait before being seen by our physicians.       _____________________________________________________________  Should you have questions after your visit to [facility], please contact our office at [phone] between the hours of 8:30 a.m. and 4:30 p.m.  Voicemails left after 4:30 p.m. will not be returned until the following business day.  For prescription refill requests, have your pharmacy contact our office with your prescription refill request.    _______________________________________________________________  We hope that we have given you very good care.  You may receive a patient satisfaction survey in the mail, please complete it and return it as soon as possible.  We value your  feedback!  _______________________________________________________________  Have you asked about our STAR program?  STAR stands for Survivorship Training and Rehabilitation, and this is a nationally recognized cancer care program that focuses on survivorship and rehabilitation.  Cancer and cancer treatments may cause problems, such as, pain, making you feel tired and keeping you from doing the things that you need or want to do. Cancer rehabilitation can help. Our goal is to reduce these troubling effects and help you have the best quality of life possible.  You may receive a survey from a nurse that asks questions about your current state of health.  Based on the survey results, all eligible patients will be referred to the [facility] program for an evaluation so we can better serve you!  A frequently asked questions sheet is available upon request.

## 2013-11-09 NOTE — Progress Notes (Signed)
Tolerated Herceptin without any problems. Discharge papers given with appointments on them.

## 2013-11-30 ENCOUNTER — Encounter (HOSPITAL_COMMUNITY): Payer: Medicare Other | Attending: Hematology and Oncology

## 2013-11-30 VITALS — BP 129/67 | HR 57 | Temp 98.0°F | Resp 18 | Wt 139.0 lb

## 2013-11-30 DIAGNOSIS — Z5112 Encounter for antineoplastic immunotherapy: Secondary | ICD-10-CM

## 2013-11-30 DIAGNOSIS — C50212 Malignant neoplasm of upper-inner quadrant of left female breast: Secondary | ICD-10-CM

## 2013-11-30 DIAGNOSIS — C50219 Malignant neoplasm of upper-inner quadrant of unspecified female breast: Secondary | ICD-10-CM | POA: Diagnosis not present

## 2013-11-30 LAB — COMPREHENSIVE METABOLIC PANEL
ALK PHOS: 48 U/L (ref 39–117)
ALT: 11 U/L (ref 0–35)
ANION GAP: 11 (ref 5–15)
AST: 18 U/L (ref 0–37)
Albumin: 3.4 g/dL — ABNORMAL LOW (ref 3.5–5.2)
BILIRUBIN TOTAL: 0.6 mg/dL (ref 0.3–1.2)
BUN: 15 mg/dL (ref 6–23)
CO2: 23 mEq/L (ref 19–32)
CREATININE: 0.75 mg/dL (ref 0.50–1.10)
Calcium: 8.3 mg/dL — ABNORMAL LOW (ref 8.4–10.5)
Chloride: 106 mEq/L (ref 96–112)
GFR calc non Af Amer: 81 mL/min — ABNORMAL LOW (ref >90)
GLUCOSE: 85 mg/dL (ref 70–99)
POTASSIUM: 3.9 meq/L (ref 3.7–5.3)
Sodium: 140 mEq/L (ref 137–147)
Total Protein: 6.2 g/dL (ref 6.0–8.3)

## 2013-11-30 LAB — CBC WITH DIFFERENTIAL/PLATELET
Basophils Absolute: 0 10*3/uL (ref 0.0–0.1)
Basophils Relative: 1 % (ref 0–1)
Eosinophils Absolute: 0.2 10*3/uL (ref 0.0–0.7)
Eosinophils Relative: 2 % (ref 0–5)
HCT: 36.9 % (ref 36.0–46.0)
HEMOGLOBIN: 11.8 g/dL — AB (ref 12.0–15.0)
LYMPHS ABS: 2.3 10*3/uL (ref 0.7–4.0)
Lymphocytes Relative: 31 % (ref 12–46)
MCH: 29.4 pg (ref 26.0–34.0)
MCHC: 32 g/dL (ref 30.0–36.0)
MCV: 92 fL (ref 78.0–100.0)
MONO ABS: 0.8 10*3/uL (ref 0.1–1.0)
MONOS PCT: 11 % (ref 3–12)
NEUTROS PCT: 55 % (ref 43–77)
Neutro Abs: 4.2 10*3/uL (ref 1.7–7.7)
Platelets: 261 10*3/uL (ref 150–400)
RBC: 4.01 MIL/uL (ref 3.87–5.11)
RDW: 14.3 % (ref 11.5–15.5)
WBC: 7.5 10*3/uL (ref 4.0–10.5)

## 2013-11-30 MED ORDER — DIPHENHYDRAMINE HCL 25 MG PO CAPS
50.0000 mg | ORAL_CAPSULE | Freq: Once | ORAL | Status: AC
  Administered 2013-11-30: 50 mg via ORAL

## 2013-11-30 MED ORDER — SODIUM CHLORIDE 0.9 % IV SOLN
Freq: Once | INTRAVENOUS | Status: AC
  Administered 2013-11-30: 10:00:00 via INTRAVENOUS

## 2013-11-30 MED ORDER — ACETAMINOPHEN 325 MG PO TABS
650.0000 mg | ORAL_TABLET | Freq: Once | ORAL | Status: AC
  Administered 2013-11-30: 650 mg via ORAL

## 2013-11-30 MED ORDER — HEPARIN SOD (PORK) LOCK FLUSH 100 UNIT/ML IV SOLN: 250.0000 [IU] | Freq: Once | INTRAVENOUS | Status: DC | PRN

## 2013-11-30 MED ORDER — SODIUM CHLORIDE 0.9 % IJ SOLN
10.0000 mL | INTRAMUSCULAR | Status: DC | PRN
  Administered 2013-11-30: 10 mL

## 2013-11-30 MED ORDER — HEPARIN SOD (PORK) LOCK FLUSH 100 UNIT/ML IV SOLN
500.0000 [IU] | Freq: Once | INTRAVENOUS | Status: AC | PRN
  Administered 2013-11-30: 500 [IU]
  Filled 2013-11-30: qty 5

## 2013-11-30 MED ORDER — DIPHENHYDRAMINE HCL 25 MG PO CAPS
ORAL_CAPSULE | ORAL | Status: AC
  Filled 2013-11-30: qty 2

## 2013-11-30 MED ORDER — ACETAMINOPHEN 325 MG PO TABS
ORAL_TABLET | ORAL | Status: AC
  Filled 2013-11-30: qty 2

## 2013-11-30 MED ORDER — TRASTUZUMAB CHEMO INJECTION 440 MG
6.0000 mg/kg | Freq: Once | INTRAVENOUS | Status: AC
  Administered 2013-11-30: 420 mg via INTRAVENOUS
  Filled 2013-11-30: qty 20

## 2013-11-30 MED ORDER — SODIUM CHLORIDE 0.9 % IJ SOLN: 3.0000 mL | INTRAMUSCULAR | Status: DC | PRN

## 2013-11-30 NOTE — Progress Notes (Signed)
Tolerated well

## 2013-11-30 NOTE — Patient Instructions (Signed)
..  [facility] Discharge Instructions for Patients Receiving Chemotherapy  Today you received the following chemotherapy agents Herceptin Mugga in September as scheduled  To help prevent nausea and vomiting after your treatment, we encourage you to take your nausea medication     If you develop nausea and vomiting, or diarrhea that is not controlled by your medication, call the clinic.  The clinic phone number is [phone]. Office hours are Monday-Friday 8:30am-5:00pm.  BELOW ARE SYMPTOMS THAT SHOULD BE REPORTED IMMEDIATELY:  *FEVER GREATER THAN 101.0 F  *CHILLS WITH OR WITHOUT FEVER  NAUSEA AND VOMITING THAT IS NOT CONTROLLED WITH YOUR NAUSEA MEDICATION  *UNUSUAL SHORTNESS OF BREATH  *UNUSUAL BRUISING OR BLEEDING  TENDERNESS IN MOUTH AND THROAT WITH OR WITHOUT PRESENCE OF ULCERS  *URINARY PROBLEMS  *BOWEL PROBLEMS  UNUSUAL RASH Items with * indicate a potential emergency and should be followed up as soon as possible. If you have an emergency after office hours please contact your primary care physician or go to the nearest emergency department.  Please call the clinic during office hours if you have any questions or concerns.   You may also contact the Patient Navigator at [phone] should you have any questions or need assistance in obtaining follow up care. _____________________________________________________________________ Have you asked about our STAR program?    STAR stands for Survivorship Training and Rehabilitation, and this is a nationally recognized cancer care program that focuses on survivorship and rehabilitation.  Cancer and cancer treatments may cause problems, such as, pain, making you feel tired and keeping you from doing the things that you need or want to do. Cancer rehabilitation can help. Our goal is to reduce these troubling effects and help you have the best quality of life possible.  You may receive a survey from a nurse that  asks questions about your current state of health.  Based on the survey results, all eligible patients will be referred to the [facility] program for an evaluation so we can better serve you! A frequently asked questions sheet is available upon request.

## 2013-12-01 ENCOUNTER — Ambulatory Visit (INDEPENDENT_AMBULATORY_CARE_PROVIDER_SITE_OTHER): Payer: Medicare Other | Admitting: *Deleted

## 2013-12-01 DIAGNOSIS — Z5181 Encounter for therapeutic drug level monitoring: Secondary | ICD-10-CM

## 2013-12-01 DIAGNOSIS — Z7901 Long term (current) use of anticoagulants: Secondary | ICD-10-CM

## 2013-12-01 DIAGNOSIS — I4891 Unspecified atrial fibrillation: Secondary | ICD-10-CM

## 2013-12-01 LAB — POCT INR: INR: 2.5

## 2013-12-19 ENCOUNTER — Other Ambulatory Visit (HOSPITAL_COMMUNITY): Payer: Self-pay | Admitting: Hematology and Oncology

## 2013-12-19 DIAGNOSIS — C50212 Malignant neoplasm of upper-inner quadrant of left female breast: Secondary | ICD-10-CM

## 2013-12-19 MED ORDER — METOCLOPRAMIDE HCL 10 MG PO TABS: 10.0000 mg | ORAL_TABLET | Freq: Three times a day (TID) | ORAL | Status: DC

## 2013-12-20 NOTE — Progress Notes (Signed)
[name], MD [place] [address]  Breast cancer of upper-inner quadrant of left female breast - Plan: acetaminophen (TYLENOL) tablet 650 mg, diphenhydrAMINE (BENADRYL) capsule 50 mg, trastuzumab (HERCEPTIN) 420 mg in sodium chloride 0.9 % 250 mL chemo infusion, sodium chloride 0.9 % injection 10 mL, heparin lock flush 100 unit/mL, 0.9 %  sodium chloride infusion, SCHEDULING COMMUNICATION, PHYSICIAN COMMUNICATION ORDER  CURRENT THERAPY:Herceptin intravenously every 3 weeks to complete one year of therapy which will be in December 2015, anastrozole 1 mg daily started 06/28/2013  INTERVAL HISTORY: Stacie Huang 73 y.o. 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 Now on Herceptin every 3 weeks alone with plans to finish treatment in December of 2015 giving the agent every 3 weeks with anastrozole started on 06/28/2013.    Breast cancer of upper-inner quadrant of left female breast   02/04/2013 Initial Diagnosis Breast cancer of upper-inner quadrant of left female breast   02/23/2013 Surgery left lumpectomy (UIQ), sentinel node biopsy--0.7cm, Node negative, ER+, PR+, HER-2/neu over expressed.   04/08/2013 - 04/14/2013 Chemotherapy Paclitaxel/Herceptin weekly x 12. Intolerance to Paclitaxel and only receiving 2 cycles   05/04/2013 -  Chemotherapy Herceptin every 21 days to complete 1 year worth   06/28/2013 -  Chemotherapy Anastrazole started   I personally reviewed and went over laboratory results with the patient.  The results are noted within this dictation.  She is doing very well and denies any complaints today.  She asks if she still needs annual mammograms and she was educated that she should have them done every year for screening.  She understands the importance of  that screening test.  She has a MUGA scan scheduled for 9/1 and she is reminded of this exam.  Oncologically, she denies any complaints and ROS questioning is negative.  Past Medical History  Diagnosis Date  . Hypertension   . Paroxysmal atrial fibrillation   . Chronic anticoagulation   . Hyperlipidemia   . GERD (gastroesophageal reflux disease)   . Cancer   . Breast cancer     has Hyperlipidemia; Chronic anticoagulation; Atrial fibrillation; Hypertension; Vitamin D deficiency; Breast cancer of upper-inner quadrant of left female breast; Paroxysmal atrial fibrillation; Encounter for therapeutic drug monitoring; GERD (gastroesophageal reflux disease); and Bloating on her problem list.     is allergic to iohexol and sulfa antibiotics.  We administered acetaminophen, diphenhydrAMINE, sodium chloride, and sodium chloride.  Past Surgical History  Procedure Laterality Date  . Abdominal hysterectomy    . Cholecystectomy    . Partial mastectomy with axillary sentinel lymph node biopsy Left 02/23/2013    Procedure: LEFT PARTIAL MASTECTOMY WITH AXILLARY SIMPLE NODE BIOPSY, LEFT BREAST WIDE EXCISION;  Surgeon: Scherry Ran, MD;  Location: [facility];  Service: General;  Laterality: Left;  . Axillary lymph node dissection Left 02/23/2013    Procedure: LEFT AXILLARY LYMPH NODE DISSECTION;  Surgeon: Scherry Ran, MD;  Location: [facility];  Service: General;  Laterality: Left;  . Portacath placement Right 04/07/2013  . Portacath placement Right 04/07/2013    Procedure: INSERTION PORT-A-CATH;  Surgeon: Scherry Ran, MD;  Location: [facility];  Service: General;  Laterality: Right;  . Esophagogastroduodenoscopy  03/10/2002    CBU:LAGTXM esophagus/small HH/Tiny AVM in antrum, otherwise normal stomach and D1 and D2/Status post passage  of the [facility] dilator  . Colonoscopy  03/10/2002    RMR: Incomplete colonoscopy (sigmoidoscopy)/Normal rectum/Normal colon to 40 cm.     Denies any  headaches, dizziness, double vision, fevers, chills, night sweats, nausea, vomiting, diarrhea, constipation, chest pain, heart palpitations, shortness of breath, blood in stool, black tarry stool, urinary pain, urinary burning, urinary frequency, hematuria.   PHYSICAL EXAMINATION  ECOG PERFORMANCE STATUS: 1 - Symptomatic but completely ambulatory  Filed Vitals:   12/21/13 0949  BP: 127/69  Pulse: 68  Temp: 98 F (36.7 C)  Resp: 18    GENERAL:alert, no distress, well nourished, well developed, comfortable, cooperative and smiling SKIN: skin color, texture, turgor are normal, no rashes or significant lesions HEAD: Normocephalic, No masses, lesions, tenderness or abnormalities EYES: normal, PERRLA, EOMI, Conjunctiva are pink and non-injected EARS: External ears normal OROPHARYNX:mucous membranes are moist  NECK: supple, no adenopathy, thyroid normal size, non-tender, without nodularity, no stridor, non-tender, trachea midline LYMPH:  no palpable lymphadenopathy, no hepatosplenomegaly BREAST:not examined LUNGS: clear to auscultation  HEART: regular rate & rhythm, no murmurs, no gallops, S1 normal and S2 normal ABDOMEN:abdomen soft, non-tender, normal bowel sounds and no masses or organomegaly BACK: Back symmetric, no curvature. EXTREMITIES:less then 2 second capillary refill, no joint deformities, effusion, or inflammation, no edema, no skin discoloration, no clubbing, no cyanosis  NEURO: alert & oriented x 3 with fluent speech, no focal motor/sensory deficits, gait normal   LABORATORY DATA: CBC    Component Value Date/Time   WBC 7.5 11/30/2013 1014   RBC 4.01 11/30/2013 1014   HGB 11.8* 11/30/2013 1014   HCT 36.9 11/30/2013 1014   PLT 261 11/30/2013 1014   MCV 92.0 11/30/2013 1014   MCH 29.4 11/30/2013 1014   MCHC 32.0 11/30/2013 1014   RDW 14.3 11/30/2013 1014   LYMPHSABS 2.3 11/30/2013 1014   MONOABS 0.8 11/30/2013 1014   EOSABS 0.2 11/30/2013 1014   BASOSABS 0.0 11/30/2013 1014       Chemistry      Component Value Date/Time   NA 140 11/30/2013 1014   K 3.9 11/30/2013 1014   CL 106 11/30/2013 1014   CO2 23 11/30/2013 1014   BUN 15 11/30/2013 1014   CREATININE 0.75 11/30/2013 1014      Component Value Date/Time   CALCIUM 8.3* 11/30/2013 1014   ALKPHOS 48 11/30/2013 1014   AST 18 11/30/2013 1014   ALT 11 11/30/2013 1014   BILITOT 0.6 11/30/2013 1014       ASSESSMENT:  1. 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 Romona Curls, sentinel node biopsy, 0.7 cm, ER positive, PR positive, HER-2/neu overexpressed, tolerating Herceptin alone.  2. Rhinitis, controlled  3. Paroxysmal atrial fibrillation, on long-term anticoagulation.  4. Hypertension, controlled.  5. Symptomatic reflux disease.  6. Normal bone density on 08/03/2013. Repeat bone density in April 2017.  Patient Active Problem List   Diagnosis Date Noted  . GERD (gastroesophageal reflux disease) 08/11/2013  . Bloating 08/11/2013  . Encounter for therapeutic drug monitoring 06/17/2013  . Paroxysmal atrial fibrillation 03/17/2013  . Vitamin D deficiency 03/16/2013  . Breast cancer of upper-inner quadrant of left female breast 03/16/2013  . Atrial fibrillation   . Hypertension   . Chronic anticoagulation 11/11/2010  . Hyperlipidemia 02/26/2010     PLAN:  1. I personally reviewed and went over laboratory results with the patient. The results are noted within this dictation.  2. Continue anastrozole  1 mg daily  3. Herceptin intravenously today and continued every 3 weeks until December 2015.  4. MUGA as scheduled on 9/1 to evaluate EF since she is on Herceptin. 5. Pre-chemo labs: CBC diff, CMET every 6 weeks  6. Return in 9 weeks for follow-up   THERAPY PLAN:  She is to continue with therapy as planned.   All questions were answered. The patient knows to call the clinic with any problems, questions or concerns. We can certainly see the patient much  sooner if necessary.  Patient and plan discussed with Dr. Farrel Gobble and he is in agreement with the aforementioned.   Stacie Huang 12/21/2013

## 2013-12-21 ENCOUNTER — Encounter: Payer: Self-pay | Admitting: *Deleted

## 2013-12-21 ENCOUNTER — Encounter (HOSPITAL_BASED_OUTPATIENT_CLINIC_OR_DEPARTMENT_OTHER): Payer: Medicare Other

## 2013-12-21 ENCOUNTER — Encounter (HOSPITAL_BASED_OUTPATIENT_CLINIC_OR_DEPARTMENT_OTHER): Payer: Medicare Other | Admitting: Oncology

## 2013-12-21 VITALS — BP 127/69 | HR 68 | Temp 98.0°F | Resp 18 | Wt 140.0 lb

## 2013-12-21 DIAGNOSIS — C50219 Malignant neoplasm of upper-inner quadrant of unspecified female breast: Secondary | ICD-10-CM

## 2013-12-21 DIAGNOSIS — C50212 Malignant neoplasm of upper-inner quadrant of left female breast: Secondary | ICD-10-CM

## 2013-12-21 DIAGNOSIS — Z5112 Encounter for antineoplastic immunotherapy: Secondary | ICD-10-CM

## 2013-12-21 MED ORDER — DIPHENHYDRAMINE HCL 25 MG PO CAPS
50.0000 mg | ORAL_CAPSULE | Freq: Once | ORAL | Status: AC
  Administered 2013-12-21: 50 mg via ORAL

## 2013-12-21 MED ORDER — HEPARIN SOD (PORK) LOCK FLUSH 100 UNIT/ML IV SOLN
500.0000 [IU] | Freq: Once | INTRAVENOUS | Status: AC | PRN
  Administered 2013-12-21: 500 [IU]
  Filled 2013-12-21: qty 5

## 2013-12-21 MED ORDER — SODIUM CHLORIDE 0.9 % IJ SOLN
10.0000 mL | INTRAMUSCULAR | Status: DC | PRN
  Administered 2013-12-21: 10 mL

## 2013-12-21 MED ORDER — DIPHENHYDRAMINE HCL 25 MG PO CAPS
ORAL_CAPSULE | ORAL | Status: AC
  Filled 2013-12-21: qty 2

## 2013-12-21 MED ORDER — ACETAMINOPHEN 325 MG PO TABS
ORAL_TABLET | ORAL | Status: AC
  Filled 2013-12-21: qty 2

## 2013-12-21 MED ORDER — SODIUM CHLORIDE 0.9 % IV SOLN
Freq: Once | INTRAVENOUS | Status: AC
  Administered 2013-12-21: 10:00:00 via INTRAVENOUS

## 2013-12-21 MED ORDER — TRASTUZUMAB CHEMO INJECTION 440 MG
6.0000 mg/kg | Freq: Once | INTRAVENOUS | Status: AC
  Administered 2013-12-21: 420 mg via INTRAVENOUS
  Filled 2013-12-21: qty 20

## 2013-12-21 MED ORDER — ACETAMINOPHEN 325 MG PO TABS
650.0000 mg | ORAL_TABLET | Freq: Once | ORAL | Status: AC
  Administered 2013-12-21: 650 mg via ORAL

## 2013-12-21 NOTE — Progress Notes (Signed)
[facility] Clinical Social Work  Clinical Social Work met briefly with pt in the infusion room to check in and introduce self.   Clinical Social Worker explained role of CSW and assistance available. Pt denied current concerns, but agrees to reach out to CSW if future needs arise.   Clinical Social Work interventions: Education about role of [place], Programme researcher, broadcasting/film/video

## 2013-12-21 NOTE — Patient Instructions (Signed)
[facility] Discharge Instructions  RECOMMENDATIONS MADE BY THE CONSULTANT AND ANY TEST RESULTS WILL BE SENT TO YOUR REFERRING PHYSICIAN.  Follow up as scheduled for your MUGA scan, treatment, and office visit. Have your mammogram as scheduled (your port will not interfere with this).  Thank you for choosing [facility] to provide your oncology and hematology care.  To afford each patient quality time with our providers, please arrive at least 15 minutes before your scheduled appointment time.  With your help, our goal is to use those 15 minutes to complete the necessary work-up to ensure our physicians have the information they need to help with your evaluation and healthcare recommendations.    Effective January 1st, 2014, we ask that you re-schedule your appointment with our physicians should you arrive 10 or more minutes late for your appointment.  We strive to give you quality time with our providers, and arriving late affects you and other patients whose appointments are after yours.    Again, thank you for choosing [facility].  Our hope is that these requests will decrease the amount of time that you wait before being seen by our physicians.       _____________________________________________________________  Should you have questions after your visit to [facility], please contact our office at [phone] between the hours of 8:30 a.m. and 4:30 p.m.  Voicemails left after 4:30 p.m. will not be returned until the following business day.  For prescription refill requests, have your pharmacy contact our office with your prescription refill request.    _______________________________________________________________  We hope that we have given you very good care.  You may receive a patient satisfaction survey in the mail, please complete it and return it as soon as possible.  We value your  feedback!  _______________________________________________________________  Have you asked about our STAR program?  STAR stands for Survivorship Training and Rehabilitation, and this is a nationally recognized cancer care program that focuses on survivorship and rehabilitation.  Cancer and cancer treatments may cause problems, such as, pain, making you feel tired and keeping you from doing the things that you need or want to do. Cancer rehabilitation can help. Our goal is to reduce these troubling effects and help you have the best quality of life possible.  You may receive a survey from a nurse that asks questions about your current state of health.  Based on the survey results, all eligible patients will be referred to the [facility] program for an evaluation so we can better serve you!  A frequently asked questions sheet is available upon request.

## 2013-12-21 NOTE — Progress Notes (Signed)
Tolerated infusion well. 

## 2013-12-23 ENCOUNTER — Other Ambulatory Visit (HOSPITAL_COMMUNITY): Payer: Self-pay | Admitting: Oncology

## 2013-12-23 DIAGNOSIS — C50912 Malignant neoplasm of unspecified site of left female breast: Secondary | ICD-10-CM

## 2013-12-26 ENCOUNTER — Emergency Department (HOSPITAL_COMMUNITY)
Admission: EM | Admit: 2013-12-26 | Discharge: 2013-12-26 | Disposition: A | Discharge status: Home / Self Care | Payer: Medicare Other | Attending: Emergency Medicine | Admitting: Emergency Medicine

## 2013-12-26 ENCOUNTER — Encounter (HOSPITAL_COMMUNITY): Payer: Self-pay | Admitting: Emergency Medicine

## 2013-12-26 DIAGNOSIS — Z853 Personal history of malignant neoplasm of breast: Secondary | ICD-10-CM | POA: Insufficient documentation

## 2013-12-26 DIAGNOSIS — Z7901 Long term (current) use of anticoagulants: Secondary | ICD-10-CM | POA: Insufficient documentation

## 2013-12-26 DIAGNOSIS — I10 Essential (primary) hypertension: Secondary | ICD-10-CM | POA: Insufficient documentation

## 2013-12-26 DIAGNOSIS — IMO0002 Reserved for concepts with insufficient information to code with codable children: Secondary | ICD-10-CM | POA: Insufficient documentation

## 2013-12-26 DIAGNOSIS — M79609 Pain in unspecified limb: Secondary | ICD-10-CM | POA: Insufficient documentation

## 2013-12-26 DIAGNOSIS — L03019 Cellulitis of unspecified finger: Secondary | ICD-10-CM | POA: Insufficient documentation

## 2013-12-26 DIAGNOSIS — IMO0001 Reserved for inherently not codable concepts without codable children: Secondary | ICD-10-CM

## 2013-12-26 DIAGNOSIS — Z79899 Other long term (current) drug therapy: Secondary | ICD-10-CM | POA: Insufficient documentation

## 2013-12-26 DIAGNOSIS — L03011 Cellulitis of right finger: Secondary | ICD-10-CM

## 2013-12-26 DIAGNOSIS — R002 Palpitations: Secondary | ICD-10-CM | POA: Diagnosis not present

## 2013-12-26 MED ORDER — DOXYCYCLINE HYCLATE 100 MG PO TABS
100.0000 mg | ORAL_TABLET | Freq: Once | ORAL | Status: AC
  Administered 2013-12-26: 100 mg via ORAL
  Filled 2013-12-26: qty 1

## 2013-12-26 MED ORDER — POVIDONE-IODINE 10 % EX SOLN
CUTANEOUS | Status: AC
  Filled 2013-12-26: qty 118

## 2013-12-26 MED ORDER — DOXYCYCLINE HYCLATE 100 MG PO CAPS: 100.0000 mg | ORAL_CAPSULE | Freq: Two times a day (BID) | ORAL | Status: DC

## 2013-12-26 MED ORDER — ONDANSETRON HCL 4 MG PO TABS
4.0000 mg | ORAL_TABLET | Freq: Once | ORAL | Status: AC
  Administered 2013-12-26: 4 mg via ORAL
  Filled 2013-12-26: qty 1

## 2013-12-26 MED ORDER — OXYCODONE-ACETAMINOPHEN 5-325 MG PO TABS
1.0000 | ORAL_TABLET | Freq: Once | ORAL | Status: AC
  Administered 2013-12-26: 1 via ORAL
  Filled 2013-12-26: qty 1

## 2013-12-26 MED ORDER — LIDOCAINE HCL (PF) 1 % IJ SOLN
5.0000 mL | Freq: Once | INTRAMUSCULAR | Status: DC
  Filled 2013-12-26: qty 5

## 2013-12-26 MED ORDER — OXYCODONE-ACETAMINOPHEN 5-325 MG PO TABS: 1.0000 | ORAL_TABLET | Freq: Four times a day (QID) | ORAL | Status: DC | PRN

## 2013-12-26 MED ORDER — HYDROGEN PEROXIDE 3 % EX SOLN
CUTANEOUS | Status: AC
  Filled 2013-12-26: qty 473

## 2013-12-26 NOTE — ED Notes (Signed)
Right ring finger swollen and red since yesterday.

## 2013-12-26 NOTE — Discharge Instructions (Signed)
Please cleanse the wound to your finger with soap and water daily. Please apply a clean Band-Aid daily until healed. Please use doxycycline 2 times daily with food until all taken. Use Tylenol for mild pain, use Percocet for more severe pain. Percocet may cause drowsiness, dizziness, and or constipation. Please use with caution. Please return if any signs of advancing infection. Paronychia Paronychia is an inflammatory reaction involving the folds of the skin surrounding the fingernail. This is commonly caused by an infection in the skin around a nail. The most common cause of paronychia is frequent wetting of the hands (as seen with bartenders, food servers, nurses or others who wet their hands). This makes the skin around the fingernail susceptible to infection by bacteria (germs) or fungus. Other predisposing factors are:  Aggressive manicuring.  Nail biting.  Thumb sucking. The most common cause is a staphylococcal (a type of germ) infection, or a fungal (Candida) infection. When caused by a germ, it usually comes on suddenly with redness, swelling, pus and is often painful. It may get under the nail and form an abscess (collection of pus), or form an abscess around the nail. If the nail itself is infected with a fungus, the treatment is usually prolonged and may require oral medicine for up to one year. Your caregiver will determine the length of time treatment is required. The paronychia caused by bacteria (germs) may largely be avoided by not pulling on hangnails or picking at cuticles. When the infection occurs at the tips of the finger it is called felon. When the cause of paronychia is from the herpes simplex virus (HSV) it is called herpetic whitlow. TREATMENT  When an abscess is present treatment is often incision and drainage. This means that the abscess must be cut open so the pus can get out. When this is done, the following home care instructions should be followed. HOME CARE INSTRUCTIONS    It is important to keep the affected fingers very dry. Rubber or plastic gloves over cotton gloves should be used whenever the hand must be placed in water.  Keep wound clean, dry and dressed as suggested by your caregiver between warm soaks or warm compresses.  Soak in warm water for fifteen to twenty minutes three to four times per day for bacterial infections. Fungal infections are very difficult to treat, so often require treatment for long periods of time.  For bacterial (germ) infections take antibiotics (medicine which kill germs) as directed and finish the prescription, even if the problem appears to be solved before the medicine is gone.  Only take over-the-counter or prescription medicines for pain, discomfort, or fever as directed by your caregiver. SEEK IMMEDIATE MEDICAL CARE IF:  You have redness, swelling, or increasing pain in the wound.  You notice pus coming from the wound.  You have a fever.  You notice a bad smell coming from the wound or dressing. Document Released: 10/08/2000 Document Revised: 07/07/2011 Document Reviewed: 06/09/2008 [facility] Patient Information 2015 [place], [place]. This information is not intended to replace advice given to you by your health care provider. Make sure you discuss any questions you have with your health care provider.

## 2013-12-26 NOTE — ED Provider Notes (Signed)
CSN: [id number]     Arrival date & time 12/26/13  6568 History   First MD Initiated Contact with Patient 12/26/13 1007     Chief Complaint  Patient presents with  . Hand Pain     (Consider location/radiation/quality/duration/timing/severity/associated sxs/prior Treatment) Patient is a 73 y.o. female presenting with hand pain.  Hand Pain This is a new problem. The current episode started yesterday. The problem occurs constantly. The problem has been gradually worsening. Pertinent negatives include no abdominal pain, arthralgias, chest pain, coughing, fever or neck pain. Exacerbated by: palpation of right ring finger. She has tried nothing for the symptoms. The treatment provided no relief.    Past Medical History  Diagnosis Date  . Hypertension   . Paroxysmal atrial fibrillation   . Chronic anticoagulation   . Hyperlipidemia   . GERD (gastroesophageal reflux disease)   . Cancer   . Breast cancer    Past Surgical History  Procedure Laterality Date  . Abdominal hysterectomy    . Cholecystectomy    . Partial mastectomy with axillary sentinel lymph node biopsy Left 02/23/2013    Procedure: LEFT PARTIAL MASTECTOMY WITH AXILLARY SIMPLE NODE BIOPSY, LEFT BREAST WIDE EXCISION;  Surgeon: Scherry Ran, MD;  Location: [facility];  Service: General;  Laterality: Left;  . Axillary lymph node dissection Left 02/23/2013    Procedure: LEFT AXILLARY LYMPH NODE DISSECTION;  Surgeon: Scherry Ran, MD;  Location: [facility];  Service: General;  Laterality: Left;  . Portacath placement Right 04/07/2013  . Portacath placement Right 04/07/2013    Procedure: INSERTION PORT-A-CATH;  Surgeon: Scherry Ran, MD;  Location: [facility];  Service: General;  Laterality: Right;  . Esophagogastroduodenoscopy  03/10/2002    LEX:NTZGYF esophagus/small HH/Tiny AVM in antrum, otherwise normal stomach and D1 and D2/Status post passage of the [facility] dilator  . Colonoscopy  03/10/2002    RMR: Incomplete  colonoscopy (sigmoidoscopy)/Normal rectum/Normal colon to 40 cm.    Family History  Problem Relation Age of Onset  . Heart disease Mother     Also 54 of 9 siblings  . Heart attack Father   . Cancer      2 siblings  . Leukemia     History  Substance Use Topics  . Smoking status: Never Smoker   . Smokeless tobacco: Never Used  . Alcohol Use: No   OB History   Grav Para Term Preterm Abortions TAB SAB Ect Mult Living   2 2 2             Review of Systems  Constitutional: Negative for fever and activity change.       All ROS Neg except as noted in HPI  HENT: Negative for nosebleeds.   Eyes: Negative for photophobia and discharge.  Respiratory: Negative for cough, shortness of breath and wheezing.   Cardiovascular: Positive for palpitations. Negative for chest pain.  Gastrointestinal: Negative for abdominal pain and blood in stool.  Genitourinary: Negative for dysuria, frequency and hematuria.  Musculoskeletal: Negative for arthralgias, back pain and neck pain.  Skin: Negative.   Neurological: Negative for dizziness, seizures and speech difficulty.  Psychiatric/Behavioral: Negative for hallucinations and confusion.      Allergies  Iohexol and Sulfa antibiotics  Home Medications   Prior to Admission medications   Medication Sig Start Date End Date Taking? Authorizing Provider  acetaminophen (TYLENOL) 500 MG tablet Take 1,000 mg by mouth daily.   Yes Historical Provider, MD  anastrozole (ARIMIDEX) 1 MG tablet Take 1 tablet (1  mg total) by mouth daily. 08/10/13  Yes Manon Hilding Kefalas, PA-C  atenolol (TENORMIN) 100 MG tablet Take 50 mg by mouth daily before breakfast.    Yes Historical Provider, MD  calcium carbonate (OS-CAL) 600 MG TABS tablet Take 600 mg by mouth daily.   Yes Historical Provider, MD  cetirizine (ZYRTEC) 10 MG tablet Take 10 mg by mouth every morning.    Yes Historical Provider, MD  Cholecalciferol (VITAMIN D) 400 UNITS capsule Take 400 Units by mouth every  morning.    Yes Historical Provider, MD  diltiazem (TIAZAC) 120 MG 24 hr capsule Take 1 capsule (120 mg total) by mouth every morning. 11/02/13  Yes Lendon Colonel, NP  fluticasone (FLONASE) 50 MCG/ACT nasal spray Place 2 sprays into the nose at bedtime.    Yes Historical Provider, MD  LORazepam (ATIVAN) 1 MG tablet Take 1 mg by mouth daily as needed for anxiety (AND/OR FOR SLEEP).    Yes Historical Provider, MD  losartan (COZAAR) 50 MG tablet Take 50 mg by mouth every morning.    Yes Historical Provider, MD  metoCLOPramide (REGLAN) 10 MG tablet Take 1 tablet (10 mg total) by mouth 4 (four) times daily -  before meals and at bedtime. 12/19/13  Yes Farrel Gobble, MD  pantoprazole (PROTONIX) 40 MG tablet Take 1 tablet (40 mg total) by mouth 2 (two) times daily before a meal. 09/08/13  Yes Mahala Menghini, PA-C  psyllium (METAMUCIL) 58.6 % powder Take 1 packet by mouth 3 (three) times daily.   Yes Historical Provider, MD  simvastatin (ZOCOR) 10 MG tablet Take 10 mg by mouth at bedtime.     Yes Historical Provider, MD  triamcinolone cream (KENALOG) 0.1 % Apply 1 application topically at bedtime.   Yes Historical Provider, MD  warfarin (COUMADIN) 2.5 MG tablet Takes 2.5 mg (1 tablet) on Tuesday and Friday takes 1.25 mg (0.5 tablet) all other days 10/05/13  Yes Lendon Colonel, NP   BP 134/80  Pulse 99  Temp(Src) 98.1 F (36.7 C) (Oral)  Resp 18  Ht 5' 1.5" (1.562 m)  Wt 140 lb (63.504 kg)  BMI 26.03 kg/m2  SpO2 99% Physical Exam  Nursing note and vitals reviewed. Constitutional: She is oriented to person, place, and time. She appears well-developed and well-nourished.  Non-toxic appearance.  HENT:  Head: Normocephalic.  Right Ear: Tympanic membrane and external ear normal.  Left Ear: Tympanic membrane and external ear normal.  Eyes: EOM and lids are normal. Pupils are equal, round, and reactive to light.  Neck: Normal range of motion. Neck supple. Carotid bruit is not present.   Cardiovascular: Normal rate, regular rhythm, normal heart sounds, intact distal pulses and normal pulses.   Pulmonary/Chest: Breath sounds normal. No respiratory distress.  Abdominal: Soft. Bowel sounds are normal. There is no tenderness. There is no guarding.  Musculoskeletal: Normal range of motion.  Paronychia of the distal right ring finger present. Tender to palpation. Full range of motion of all the fingers of the right hand. Full range of motion of the wrist. Full range of motion of the elbow. No red streaking appreciated.  Lymphadenopathy:       Head (right side): No submandibular adenopathy present.       Head (left side): No submandibular adenopathy present.    She has no cervical adenopathy.  Neurological: She is alert and oriented to person, place, and time. She has normal strength. No cranial nerve deficit or sensory deficit.  Skin: Skin is  warm and dry.  Psychiatric: She has a normal mood and affect. Her speech is normal.    ED Course  Drain paronychia Date/Time: 12/26/2013 12:08 PM Performed by: Lenox Ahr Authorized by: Lenox Ahr Consent: Verbal consent obtained. Risks and benefits: risks, benefits and alternatives were discussed Consent given by: patient Patient understanding: patient states understanding of the procedure being performed Required items: required blood products, implants, devices, and special equipment available Patient identity confirmed: arm band Time out: Immediately prior to procedure a "time out" was called to verify the correct patient, procedure, equipment, support staff and site/side marked as required. Preparation: Patient was prepped and draped in the usual sterile fashion. Local anesthesia used: yes Local anesthetic: lidocaine 1% without epinephrine Patient sedated: no Patient tolerance: Patient tolerated the procedure well with no immediate complications. Comments: Culture of drainage from ring finger sent to the lab.    (including critical care time) Labs Review Labs Reviewed - No data to display  Imaging Review No results found.   EKG Interpretation None      MDM  No evidence for advancing cellulitis. No fracture or dislocation.   Final diagnoses:  None    **I have reviewed nursing notes, vital signs, and all appropriate lab and imaging results for this patient.Lenox Ahr, PA-C 12/27/13 [phone]

## 2013-12-26 NOTE — ED Notes (Signed)
Pt ambulatory with steady gait to restroom 

## 2013-12-26 NOTE — ED Provider Notes (Signed)
Medical screening examination/treatment/procedure(s) were conducted as a shared visit with non-physician practitioner(s) and myself.  I personally evaluated the patient during the encounter.  Paronychia tender right ring finger without ascending lymphangitis.   Babette Relic, MD 12/27/13 [phone]

## 2013-12-27 ENCOUNTER — Encounter (HOSPITAL_COMMUNITY): Payer: Self-pay

## 2013-12-27 ENCOUNTER — Encounter (HOSPITAL_COMMUNITY)
Admission: RE | Admit: 2013-12-27 | Discharge: 2013-12-27 | Disposition: A | Discharge status: Home / Self Care | Payer: Medicare Other | Source: Ambulatory Visit | Attending: Oncology | Admitting: Oncology

## 2013-12-27 DIAGNOSIS — C50219 Malignant neoplasm of upper-inner quadrant of unspecified female breast: Secondary | ICD-10-CM | POA: Insufficient documentation

## 2013-12-27 DIAGNOSIS — Z09 Encounter for follow-up examination after completed treatment for conditions other than malignant neoplasm: Secondary | ICD-10-CM | POA: Insufficient documentation

## 2013-12-27 DIAGNOSIS — C50212 Malignant neoplasm of upper-inner quadrant of left female breast: Secondary | ICD-10-CM

## 2013-12-27 MED ORDER — HEPARIN SOD (PORK) LOCK FLUSH 100 UNIT/ML IV SOLN
INTRAVENOUS | Status: AC
  Administered 2013-12-27: 5 [IU]
  Filled 2013-12-27: qty 5

## 2013-12-27 MED ORDER — TECHNETIUM TC 99M-LABELED RED BLOOD CELLS IV KIT
25.0000 | PACK | Freq: Once | INTRAVENOUS | Status: AC | PRN
Start: 2013-12-27 — End: 2013-12-27
  Administered 2013-12-27: 25 via INTRAVENOUS

## 2013-12-29 ENCOUNTER — Telehealth (HOSPITAL_COMMUNITY): Payer: Self-pay

## 2013-12-29 ENCOUNTER — Ambulatory Visit (INDEPENDENT_AMBULATORY_CARE_PROVIDER_SITE_OTHER): Payer: Medicare Other | Admitting: Pharmacist

## 2013-12-29 DIAGNOSIS — Z5181 Encounter for therapeutic drug level monitoring: Secondary | ICD-10-CM

## 2013-12-29 DIAGNOSIS — Z7901 Long term (current) use of anticoagulants: Secondary | ICD-10-CM

## 2013-12-29 DIAGNOSIS — I4891 Unspecified atrial fibrillation: Secondary | ICD-10-CM

## 2013-12-29 LAB — WOUND CULTURE

## 2013-12-29 LAB — POCT INR: INR: 2.4

## 2013-12-30 ENCOUNTER — Telehealth (HOSPITAL_BASED_OUTPATIENT_CLINIC_OR_DEPARTMENT_OTHER): Payer: Self-pay | Admitting: Emergency Medicine

## 2013-12-30 NOTE — Telephone Encounter (Signed)
Post ED Visit - Positive Culture Follow-up  Culture report reviewed by antimicrobial stewardship pharmacist: []  Wes [place], Pharm.D., BCPS []  Heide Guile, Pharm.D., BCPS []  Alycia Rossetti, Pharm.D., BCPS []  [place], Pharm.D., BCPS, AAHIVP []  Legrand Como, Pharm.D., BCPS, AAHIVP []  Carly Sabat, Pharm.D. [x]  Elenor Quinones, Pharm.D.  Positive wound culture Treated with doxycycline hyclate 100mg  po caps bid x 7 days, organism sensitive to the same and no further patient follow-up is required at this time.  Hazle Nordmann 12/30/2013, 9:56 AM

## 2014-01-11 ENCOUNTER — Encounter (HOSPITAL_COMMUNITY): Payer: Medicare Other | Attending: Hematology and Oncology

## 2014-01-11 ENCOUNTER — Encounter (HOSPITAL_COMMUNITY): Payer: Self-pay | Admitting: *Deleted

## 2014-01-11 VITALS — BP 127/85 | HR 69 | Temp 98.1°F | Resp 16 | Wt 140.0 lb

## 2014-01-11 DIAGNOSIS — C50219 Malignant neoplasm of upper-inner quadrant of unspecified female breast: Secondary | ICD-10-CM | POA: Diagnosis present

## 2014-01-11 DIAGNOSIS — Z5112 Encounter for antineoplastic immunotherapy: Secondary | ICD-10-CM

## 2014-01-11 DIAGNOSIS — C50212 Malignant neoplasm of upper-inner quadrant of left female breast: Secondary | ICD-10-CM

## 2014-01-11 LAB — COMPREHENSIVE METABOLIC PANEL
ALK PHOS: 54 U/L (ref 39–117)
ALT: 11 U/L (ref 0–35)
AST: 17 U/L (ref 0–37)
Albumin: 3.9 g/dL (ref 3.5–5.2)
Anion gap: 13 (ref 5–15)
BILIRUBIN TOTAL: 0.6 mg/dL (ref 0.3–1.2)
BUN: 16 mg/dL (ref 6–23)
CHLORIDE: 101 meq/L (ref 96–112)
CO2: 25 meq/L (ref 19–32)
Calcium: 9.2 mg/dL (ref 8.4–10.5)
Creatinine, Ser: 0.89 mg/dL (ref 0.50–1.10)
GFR calc Af Amer: 72 mL/min — ABNORMAL LOW (ref >90)
GFR, EST NON AFRICAN AMERICAN: 62 mL/min — AB (ref >90)
GLUCOSE: 88 mg/dL (ref 70–99)
Potassium: 4.3 mEq/L (ref 3.7–5.3)
SODIUM: 139 meq/L (ref 137–147)
Total Protein: 7 g/dL (ref 6.0–8.3)

## 2014-01-11 LAB — CBC WITH DIFFERENTIAL/PLATELET
Basophils Absolute: 0 10*3/uL (ref 0.0–0.1)
Basophils Relative: 1 % (ref 0–1)
Eosinophils Absolute: 0.1 10*3/uL (ref 0.0–0.7)
Eosinophils Relative: 1 % (ref 0–5)
HCT: 38.4 % (ref 36.0–46.0)
HEMOGLOBIN: 12.3 g/dL (ref 12.0–15.0)
LYMPHS ABS: 3.1 10*3/uL (ref 0.7–4.0)
LYMPHS PCT: 36 % (ref 12–46)
MCH: 29.7 pg (ref 26.0–34.0)
MCHC: 32 g/dL (ref 30.0–36.0)
MCV: 92.8 fL (ref 78.0–100.0)
MONOS PCT: 9 % (ref 3–12)
Monocytes Absolute: 0.8 10*3/uL (ref 0.1–1.0)
Neutro Abs: 4.5 10*3/uL (ref 1.7–7.7)
Neutrophils Relative %: 53 % (ref 43–77)
PLATELETS: 271 10*3/uL (ref 150–400)
RBC: 4.14 MIL/uL (ref 3.87–5.11)
RDW: 13.3 % (ref 11.5–15.5)
WBC: 8.5 10*3/uL (ref 4.0–10.5)

## 2014-01-11 MED ORDER — ACETAMINOPHEN 325 MG PO TABS
650.0000 mg | ORAL_TABLET | Freq: Once | ORAL | Status: AC
  Administered 2014-01-11: 650 mg via ORAL
  Filled 2014-01-11: qty 2

## 2014-01-11 MED ORDER — TRASTUZUMAB CHEMO INJECTION 440 MG
6.0000 mg/kg | Freq: Once | INTRAVENOUS | Status: AC
  Administered 2014-01-11: 420 mg via INTRAVENOUS
  Filled 2014-01-11: qty 20

## 2014-01-11 MED ORDER — DIPHENHYDRAMINE HCL 25 MG PO CAPS
50.0000 mg | ORAL_CAPSULE | Freq: Once | ORAL | Status: AC
  Administered 2014-01-11: 50 mg via ORAL
  Filled 2014-01-11: qty 2

## 2014-01-11 MED ORDER — HEPARIN SOD (PORK) LOCK FLUSH 100 UNIT/ML IV SOLN
500.0000 [IU] | Freq: Once | INTRAVENOUS | Status: AC | PRN
  Administered 2014-01-11: 500 [IU]
  Filled 2014-01-11: qty 5

## 2014-01-11 MED ORDER — SODIUM CHLORIDE 0.9 % IV SOLN
Freq: Once | INTRAVENOUS | Status: AC
  Administered 2014-01-11: 11:00:00 via INTRAVENOUS

## 2014-01-11 MED ORDER — SODIUM CHLORIDE 0.9 % IJ SOLN
10.0000 mL | INTRAMUSCULAR | Status: DC | PRN
  Administered 2014-01-11: 10 mL

## 2014-01-11 NOTE — Patient Instructions (Signed)
[facility] Discharge Instructions for Patients Receiving Chemotherapy  Today you received the following chemotherapy agents Herceptin. To help prevent nausea and vomiting after your treatment, we encourage you to take your nausea medication as instructed. We will continue current plan of Herceptin every 21 days. Report any issues/concerns as needed. Return to clinic as scheduled. If you develop nausea and vomiting that is not controlled by your nausea medication, call the clinic. If it is after clinic hours your family physician or the after hours number for the clinic or go to the Emergency Department.   BELOW ARE SYMPTOMS THAT SHOULD BE REPORTED IMMEDIATELY:  *FEVER GREATER THAN 101.0 F  *CHILLS WITH OR WITHOUT FEVER  NAUSEA AND VOMITING THAT IS NOT CONTROLLED WITH YOUR NAUSEA MEDICATION  *UNUSUAL SHORTNESS OF BREATH  *UNUSUAL BRUISING OR BLEEDING  TENDERNESS IN MOUTH AND THROAT WITH OR WITHOUT PRESENCE OF ULCERS  *URINARY PROBLEMS  *BOWEL PROBLEMS  UNUSUAL RASH Items with * indicate a potential emergency and should be followed up as soon as possible.  One of the nurses will contact you 24 hours after your treatment. Please let the nurse know about any problems that you may have experienced. Feel free to call the clinic you have any questions or concerns. The clinic phone number is [phone].   I have been informed and understand all the instructions given to me. I know to contact the clinic, my physician, or go to the Emergency Department if any problems should occur. I do not have any questions at this time, but understand that I may call the clinic during office hours or the Patient Navigator at [phone] should I have any questions or need assistance in obtaining follow up care.    __________________________________________  _____________  __________ Signature of Patient or Authorized Representative            Date                    Time    __________________________________________ Nurse's Signature

## 2014-01-11 NOTE — Progress Notes (Signed)
Tolerated well

## 2014-01-24 ENCOUNTER — Encounter (HOSPITAL_COMMUNITY): Payer: Medicare Other

## 2014-01-25 ENCOUNTER — Encounter (HOSPITAL_COMMUNITY): Payer: Medicare Other

## 2014-01-26 ENCOUNTER — Ambulatory Visit (INDEPENDENT_AMBULATORY_CARE_PROVIDER_SITE_OTHER): Payer: Medicare Other | Admitting: *Deleted

## 2014-01-26 DIAGNOSIS — Z5181 Encounter for therapeutic drug level monitoring: Secondary | ICD-10-CM

## 2014-01-26 DIAGNOSIS — I4891 Unspecified atrial fibrillation: Secondary | ICD-10-CM

## 2014-01-26 DIAGNOSIS — Z7901 Long term (current) use of anticoagulants: Secondary | ICD-10-CM

## 2014-01-26 LAB — POCT INR: INR: 2.2

## 2014-02-01 ENCOUNTER — Encounter (HOSPITAL_COMMUNITY): Payer: Medicare Other | Attending: Hematology and Oncology

## 2014-02-01 VITALS — BP 111/60 | HR 62 | Temp 97.1°F | Resp 20 | Wt 140.8 lb

## 2014-02-01 DIAGNOSIS — C50212 Malignant neoplasm of upper-inner quadrant of left female breast: Secondary | ICD-10-CM | POA: Insufficient documentation

## 2014-02-01 DIAGNOSIS — Z5112 Encounter for antineoplastic immunotherapy: Secondary | ICD-10-CM

## 2014-02-01 MED ORDER — SODIUM CHLORIDE 0.9 % IV SOLN
6.0000 mg/kg | Freq: Once | INTRAVENOUS | Status: AC
  Administered 2014-02-01: 420 mg via INTRAVENOUS
  Filled 2014-02-01: qty 20

## 2014-02-01 MED ORDER — HEPARIN SOD (PORK) LOCK FLUSH 100 UNIT/ML IV SOLN
500.0000 [IU] | Freq: Once | INTRAVENOUS | Status: AC | PRN
  Administered 2014-02-01: 500 [IU]
  Filled 2014-02-01: qty 5

## 2014-02-01 MED ORDER — DIPHENHYDRAMINE HCL 25 MG PO CAPS
50.0000 mg | ORAL_CAPSULE | Freq: Once | ORAL | Status: AC
  Administered 2014-02-01: 50 mg via ORAL
  Filled 2014-02-01: qty 2

## 2014-02-01 MED ORDER — ACETAMINOPHEN 325 MG PO TABS
650.0000 mg | ORAL_TABLET | Freq: Once | ORAL | Status: AC
  Administered 2014-02-01: 650 mg via ORAL
  Filled 2014-02-01: qty 2

## 2014-02-01 MED ORDER — SODIUM CHLORIDE 0.9 % IJ SOLN: 10.0000 mL | INTRAMUSCULAR | Status: DC | PRN

## 2014-02-01 MED ORDER — SODIUM CHLORIDE 0.9 % IV SOLN
Freq: Once | INTRAVENOUS | Status: AC
  Administered 2014-02-01: 11:00:00 via INTRAVENOUS

## 2014-02-01 NOTE — Progress Notes (Signed)
Tolerated infusion well. 

## 2014-02-01 NOTE — Patient Instructions (Signed)
[facility] Discharge Instructions for Patients Receiving Chemotherapy  Today you received the following chemotherapy agents: Hercpetin  To help prevent nausea and vomiting after your treatment, we encourage you to take your nausea medication: Reglan as already scheduled. Please call us if nausea is not controlled with Reglan.    If you develop nausea and vomiting that is not controlled by your nausea medication, call the clinic.   BELOW ARE SYMPTOMS THAT SHOULD BE REPORTED IMMEDIATELY:  *FEVER GREATER THAN 100.5 F  *CHILLS WITH OR WITHOUT FEVER  NAUSEA AND VOMITING THAT IS NOT CONTROLLED WITH YOUR NAUSEA MEDICATION  *UNUSUAL SHORTNESS OF BREATH  *UNUSUAL BRUISING OR BLEEDING  TENDERNESS IN MOUTH AND THROAT WITH OR WITHOUT PRESENCE OF ULCERS  *URINARY PROBLEMS  *BOWEL PROBLEMS  UNUSUAL RASH Items with * indicate a potential emergency and should be followed up as soon as possible.  Feel free to call the clinic you have any questions or concerns. The clinic phone number is [phone].

## 2014-02-07 ENCOUNTER — Ambulatory Visit (HOSPITAL_COMMUNITY)
Admission: RE | Admit: 2014-02-07 | Discharge: 2014-02-07 | Disposition: A | Discharge status: Home / Self Care | Payer: Medicare Other | Source: Ambulatory Visit | Attending: Oncology | Admitting: Oncology

## 2014-02-07 DIAGNOSIS — C50912 Malignant neoplasm of unspecified site of left female breast: Secondary | ICD-10-CM | POA: Insufficient documentation

## 2014-02-07 DIAGNOSIS — Z9889 Other specified postprocedural states: Secondary | ICD-10-CM | POA: Diagnosis not present

## 2014-02-13 ENCOUNTER — Encounter: Payer: Self-pay | Admitting: Internal Medicine

## 2014-02-20 NOTE — Progress Notes (Signed)
Stacie Bellow, MD [place] [address]  Breast cancer of upper-inner quadrant of left female breast - Plan: [facility] [facility]  CURRENT THERAPY: Herceptin intravenously every 3 weeks to complete one year of therapy which will be in December 2015, anastrozole 1 mg daily started 06/28/2013  INTERVAL HISTORY: Stacie Huang 73 y.o. 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 Now on Herceptin every 3 weeks alone with plans to finish treatment in December of 2015 giving the agent every 3 weeks with anastrozole started on 06/28/2013.    Breast cancer of upper-inner quadrant of left female breast   02/04/2013 Initial Diagnosis Breast cancer of upper-inner quadrant of left female breast   02/23/2013 Surgery left lumpectomy (UIQ), sentinel node biopsy--0.7cm, Node negative, ER+, PR+, HER-2/neu over expressed.   04/08/2013 - 04/14/2013 Chemotherapy Paclitaxel/Herceptin weekly x 12. Intolerance to Paclitaxel and only receiving 2 cycles   05/04/2013 -  Chemotherapy Herceptin every 21 days to complete 1 year worth   06/28/2013 -  Chemotherapy Anastrazole started   I personally reviewed and went over laboratory results with the patient.  The results are noted within this dictation.  I personally reviewed and went over radiographic studies with the patient.  The results are noted within this dictation.  MUGA on 9/1 shows an EF of 56%.  Additionally, mammogram on 02/07/2014 was BIRADS 2.   She continues to do well from an oncology standpoint.  She knows that she will be finishing her Herceptin in December 2015 and she will continue Anastrozole daily thereafter for at least 5 years, maybe more.  Oncologically, she denies any complaints and ROS questioning is negative.  Past Medical  History  Diagnosis Date  . Hypertension   . Paroxysmal atrial fibrillation   . Chronic anticoagulation   . Hyperlipidemia   . GERD (gastroesophageal reflux disease)   . Cancer   . Breast cancer     has Hyperlipidemia; Chronic anticoagulation; Atrial fibrillation; Hypertension; Vitamin D deficiency; Breast cancer of upper-inner quadrant of left female breast; Paroxysmal atrial fibrillation; Encounter for therapeutic drug monitoring; GERD (gastroesophageal reflux disease); and Bloating on her problem list.     is allergic to iohexol and sulfa antibiotics.  Stacie Huang does not currently have medications on file.  Past Surgical History  Procedure Laterality Date  . Abdominal hysterectomy    . Cholecystectomy    . Partial mastectomy with axillary sentinel lymph node biopsy Left 02/23/2013    Procedure: LEFT PARTIAL MASTECTOMY WITH AXILLARY SIMPLE NODE BIOPSY, LEFT BREAST WIDE EXCISION;  Surgeon: Scherry Ran, MD;  Location: [facility];  Service: General;  Laterality: Left;  . Axillary lymph node dissection Left 02/23/2013    Procedure: LEFT AXILLARY LYMPH NODE DISSECTION;  Surgeon: Scherry Ran, MD;  Location: [facility];  Service: General;  Laterality: Left;  . Portacath placement Right 04/07/2013  . Portacath placement Right 04/07/2013    Procedure: INSERTION PORT-A-CATH;  Surgeon: Scherry Ran, MD;  Location: [facility];  Service: General;  Laterality: Right;  . Esophagogastroduodenoscopy  03/10/2002    OFB:PZWCHE esophagus/small HH/Tiny AVM in antrum, otherwise normal stomach and D1 and D2/Status post passage of the [facility] dilator  . Colonoscopy  03/10/2002    RMR: Incomplete colonoscopy (sigmoidoscopy)/Normal rectum/Normal colon to 40 cm.  Denies any headaches, dizziness, double vision, fevers, chills, night sweats, nausea, vomiting, diarrhea, constipation, chest pain, heart palpitations, shortness of breath, blood in stool, black tarry stool, urinary pain, urinary burning,  urinary frequency, hematuria.   PHYSICAL EXAMINATION  ECOG PERFORMANCE STATUS: 1 - Symptomatic but completely ambulatory  There were no vitals filed for this visit.  GENERAL:alert, no distress, well nourished, well developed, comfortable, cooperative, smiling and in chemotherapy reclining receiving chemotherapy. SKIN: skin color, texture, turgor are normal, no rashes or significant lesions HEAD: Normocephalic, No masses, lesions, tenderness or abnormalities EYES: normal, PERRLA, EOMI, Conjunctiva are pink and non-injected EARS: External ears normal OROPHARYNX:lips, buccal mucosa, and tongue normal and mucous membranes are moist  NECK: supple, no adenopathy, thyroid normal size, non-tender, without nodularity, no stridor, non-tender, trachea midline LYMPH:  no palpable lymphadenopathy BREAST:not examined LUNGS: clear to auscultation  HEART: regular rate & rhythm, no murmurs and no gallops ABDOMEN:normal bowel sounds BACK: Back symmetric, no curvature. EXTREMITIES:less then 2 second capillary refill, no joint deformities, effusion, or inflammation, no cyanosis  NEURO: alert & oriented x 3 with fluent speech, no focal motor/sensory deficits, gait normal   LABORATORY DATA: CBC    Component Value Date/Time   WBC 8.5 01/11/2014 1109   RBC 4.14 01/11/2014 1109   HGB 12.3 01/11/2014 1109   HCT 38.4 01/11/2014 1109   PLT 271 01/11/2014 1109   MCV 92.8 01/11/2014 1109   MCH 29.7 01/11/2014 1109   MCHC 32.0 01/11/2014 1109   RDW 13.3 01/11/2014 1109   LYMPHSABS 3.1 01/11/2014 1109   MONOABS 0.8 01/11/2014 1109   EOSABS 0.1 01/11/2014 1109   BASOSABS 0.0 01/11/2014 1109      Chemistry      Component Value Date/Time   NA 139 01/11/2014 1109   K 4.3 01/11/2014 1109   CL 101 01/11/2014 1109   CO2 25 01/11/2014 1109   BUN 16 01/11/2014 1109   CREATININE 0.89 01/11/2014 1109      Component Value Date/Time   CALCIUM 9.2 01/11/2014 1109   ALKPHOS 54 01/11/2014 1109   AST 17 01/11/2014 1109   ALT 11  01/11/2014 1109   BILITOT 0.6 01/11/2014 1109        ASSESSMENT:  1. 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 Romona Curls, sentinel node biopsy, 0.7 cm, ER positive, PR positive, HER-2/neu overexpressed, tolerating Herceptin alone.  2. Rhinitis, controlled  3. Paroxysmal atrial fibrillation, on long-term anticoagulation.  4. Hypertension, controlled.  5. Symptomatic reflux disease.  6. Normal bone density on 08/03/2013. Repeat bone density in April 2017.  Patient Active Problem List   Diagnosis Date Noted  . GERD (gastroesophageal reflux disease) 08/11/2013  . Bloating 08/11/2013  . Encounter for therapeutic drug monitoring 06/17/2013  . Paroxysmal atrial fibrillation 03/17/2013  . Vitamin D deficiency 03/16/2013  . Breast cancer of upper-inner quadrant of left female breast 03/16/2013  . Atrial fibrillation   . Hypertension   . Chronic anticoagulation 11/11/2010  . Hyperlipidemia 02/26/2010    PLAN:  1. I personally reviewed and went over laboratory results with the patient.  The results are noted within this dictation. 2. I personally reviewed and went over radiographic studies with the patient.  The results are noted within this dictation.   3. Pre-chemo labs as ordered 4. MUGA scan in December 2015. 5. Continue anastrozole 1 mg daily  6. Herceptin intravenously today and continued every 3 weeks until December 2015.  7. Pre-chemo labs: CBC diff, CMET every 6 weeks  8. Return on Dec 9th for follow-up    THERAPY PLAN:  She is to continue with therapy as planned.   All questions were answered. The patient knows to call the clinic with any problems, questions or concerns. We can certainly see the patient much sooner if necessary.  Patient and plan discussed with Dr. Farrel Gobble and he is in agreement with the aforementioned.   Stacie Huang 02/22/2014

## 2014-02-22 ENCOUNTER — Encounter (HOSPITAL_BASED_OUTPATIENT_CLINIC_OR_DEPARTMENT_OTHER): Payer: Medicare Other

## 2014-02-22 ENCOUNTER — Encounter (HOSPITAL_COMMUNITY): Payer: Self-pay

## 2014-02-22 ENCOUNTER — Encounter (HOSPITAL_BASED_OUTPATIENT_CLINIC_OR_DEPARTMENT_OTHER): Payer: Medicare Other | Admitting: Oncology

## 2014-02-22 VITALS — BP 117/59 | HR 72 | Temp 98.1°F | Resp 20 | Wt 141.8 lb

## 2014-02-22 DIAGNOSIS — C50212 Malignant neoplasm of upper-inner quadrant of left female breast: Secondary | ICD-10-CM | POA: Diagnosis not present

## 2014-02-22 DIAGNOSIS — Z170 Estrogen receptor positive status [ER+]: Secondary | ICD-10-CM

## 2014-02-22 DIAGNOSIS — Z09 Encounter for follow-up examination after completed treatment for conditions other than malignant neoplasm: Secondary | ICD-10-CM

## 2014-02-22 DIAGNOSIS — Z5112 Encounter for antineoplastic immunotherapy: Secondary | ICD-10-CM

## 2014-02-22 LAB — COMPREHENSIVE METABOLIC PANEL
ALT: 12 U/L (ref 0–35)
AST: 18 U/L (ref 0–37)
Albumin: 3.8 g/dL (ref 3.5–5.2)
Alkaline Phosphatase: 56 U/L (ref 39–117)
Anion gap: 12 (ref 5–15)
BUN: 14 mg/dL (ref 6–23)
CALCIUM: 9.4 mg/dL (ref 8.4–10.5)
CO2: 27 meq/L (ref 19–32)
Chloride: 101 mEq/L (ref 96–112)
Creatinine, Ser: 0.86 mg/dL (ref 0.50–1.10)
GFR, EST AFRICAN AMERICAN: 75 mL/min — AB (ref >90)
GFR, EST NON AFRICAN AMERICAN: 65 mL/min — AB (ref >90)
Glucose, Bld: 79 mg/dL (ref 70–99)
Potassium: 4.3 mEq/L (ref 3.7–5.3)
Sodium: 140 mEq/L (ref 137–147)
TOTAL PROTEIN: 7.1 g/dL (ref 6.0–8.3)
Total Bilirubin: 0.5 mg/dL (ref 0.3–1.2)

## 2014-02-22 LAB — CBC WITH DIFFERENTIAL/PLATELET
Basophils Absolute: 0.1 10*3/uL (ref 0.0–0.1)
Basophils Relative: 1 % (ref 0–1)
Eosinophils Absolute: 0.1 10*3/uL (ref 0.0–0.7)
Eosinophils Relative: 1 % (ref 0–5)
HEMATOCRIT: 37.1 % (ref 36.0–46.0)
Hemoglobin: 12 g/dL (ref 12.0–15.0)
LYMPHS PCT: 31 % (ref 12–46)
Lymphs Abs: 2.8 10*3/uL (ref 0.7–4.0)
MCH: 29.6 pg (ref 26.0–34.0)
MCHC: 32.3 g/dL (ref 30.0–36.0)
MCV: 91.6 fL (ref 78.0–100.0)
MONO ABS: 0.8 10*3/uL (ref 0.1–1.0)
Monocytes Relative: 9 % (ref 3–12)
Neutro Abs: 5.4 10*3/uL (ref 1.7–7.7)
Neutrophils Relative %: 58 % (ref 43–77)
Platelets: 299 10*3/uL (ref 150–400)
RBC: 4.05 MIL/uL (ref 3.87–5.11)
RDW: 13.7 % (ref 11.5–15.5)
WBC: 9.1 10*3/uL (ref 4.0–10.5)

## 2014-02-22 MED ORDER — SODIUM CHLORIDE 0.9 % IJ SOLN
10.0000 mL | INTRAMUSCULAR | Status: DC | PRN
Start: 2014-02-22 — End: 2014-02-22

## 2014-02-22 MED ORDER — DIPHENHYDRAMINE HCL 25 MG PO CAPS
50.0000 mg | ORAL_CAPSULE | Freq: Once | ORAL | Status: AC
  Administered 2014-02-22: 50 mg via ORAL
  Filled 2014-02-22: qty 2

## 2014-02-22 MED ORDER — TRASTUZUMAB CHEMO INJECTION 440 MG
6.0000 mg/kg | Freq: Once | INTRAVENOUS | Status: AC
  Administered 2014-02-22: 420 mg via INTRAVENOUS
  Filled 2014-02-22: qty 20

## 2014-02-22 MED ORDER — SODIUM CHLORIDE 0.9 % IV SOLN
Freq: Once | INTRAVENOUS | Status: AC
  Administered 2014-02-22: 11:00:00 via INTRAVENOUS

## 2014-02-22 MED ORDER — HEPARIN SOD (PORK) LOCK FLUSH 100 UNIT/ML IV SOLN
500.0000 [IU] | Freq: Once | INTRAVENOUS | Status: AC | PRN
  Administered 2014-02-22: 500 [IU]
  Filled 2014-02-22: qty 5

## 2014-02-22 MED ORDER — ACETAMINOPHEN 325 MG PO TABS
650.0000 mg | ORAL_TABLET | Freq: Once | ORAL | Status: AC
  Administered 2014-02-22: 650 mg via ORAL
  Filled 2014-02-22: qty 2

## 2014-02-22 NOTE — Patient Instructions (Signed)
[facility] Discharge Instructions for Patients Receiving Chemotherapy  Today you received the following chemotherapy agents herceptin You also seen Kirby Crigler today Continue herceptin every 3 weeks til December. Muga scan in December and follow up with the doctor on 04/05/14  To help prevent nausea and vomiting after your treatment, we encourage you to take your nausea medication     If you develop nausea and vomiting that is not controlled by your nausea medication, call the clinic. If it is after clinic hours your family physician or the after hours number for the clinic or go to the Emergency Department.   BELOW ARE SYMPTOMS THAT SHOULD BE REPORTED IMMEDIATELY:  *FEVER GREATER THAN 101.0 F  *CHILLS WITH OR WITHOUT FEVER  NAUSEA AND VOMITING THAT IS NOT CONTROLLED WITH YOUR NAUSEA MEDICATION  *UNUSUAL SHORTNESS OF BREATH  *UNUSUAL BRUISING OR BLEEDING  TENDERNESS IN MOUTH AND THROAT WITH OR WITHOUT PRESENCE OF ULCERS  *URINARY PROBLEMS  *BOWEL PROBLEMS  UNUSUAL RASH Items with * indicate a potential emergency and should be followed up as soon as possible.  One of the nurses will contact you 24 hours after your treatment. Please let the nurse know about any problems that you may have experienced. Feel free to call the clinic you have any questions or concerns. The clinic phone number is [phone].   I have been informed and understand all the instructions given to me. I know to contact the clinic, my physician, or go to the Emergency Department if any problems should occur. I do not have any questions at this time, but understand that I may call the clinic during office hours or the Patient Navigator at [phone] should I have any questions or need assistance in obtaining follow up care.    Trastuzumab injection for infusion What is this medicine? TRASTUZUMAB (tras TOO zoo mab) is a monoclonal antibody. It targets a protein called HER2. This  protein is found in some stomach and breast cancers. This medicine can stop cancer cell growth. This medicine may be used with other cancer treatments. This medicine may be used for other purposes; ask your health care provider or pharmacist if you have questions. COMMON BRAND NAME(S): Herceptin What should I tell my health care provider before I take this medicine? They need to know if you have any of these conditions: -heart disease -heart failure -infection (especially a virus infection such as chickenpox, cold sores, or herpes) -lung or breathing disease, like asthma -recent or ongoing radiation therapy -an unusual or allergic reaction to trastuzumab, benzyl alcohol, or other medications, foods, dyes, or preservatives -pregnant or trying to get pregnant -breast-feeding How should I use this medicine? This drug is given as an infusion into a vein. It is administered in a hospital or clinic by a specially trained health care professional. Talk to your pediatrician regarding the use of this medicine in children. This medicine is not approved for use in children. Overdosage: If you think you have taken too much of this medicine contact a poison control center or emergency room at once. NOTE: This medicine is only for you. Do not share this medicine with others. What if I miss a dose? It is important not to miss a dose. Call your doctor or health care professional if you are unable to keep an appointment. What may interact with this medicine? -cyclophosphamide -doxorubicin -warfarin This list may not describe all possible interactions. Give your health care provider a list of all the medicines,  herbs, non-prescription drugs, or dietary supplements you use. Also tell them if you smoke, drink alcohol, or use illegal drugs. Some items may interact with your medicine. What should I watch for while using this medicine? Visit your doctor for checks on your progress. Report any side effects.  Continue your course of treatment even though you feel ill unless your doctor tells you to stop. Call your doctor or health care professional for advice if you get a fever, chills or sore throat, or other symptoms of a cold or flu. Do not treat yourself. Try to avoid being around people who are sick. You may experience fever, chills and shaking during your first infusion. These effects are usually mild and can be treated with other medicines. Report any side effects during the infusion to your health care professional. Fever and chills usually do not happen with later infusions. What side effects may I notice from receiving this medicine? Side effects that you should report to your doctor or other health care professional as soon as possible: -breathing difficulties -chest pain or palpitations -cough -dizziness or fainting -fever or chills, sore throat -skin rash, itching or hives -swelling of the legs or ankles -unusually weak or tired Side effects that usually do not require medical attention (report to your doctor or other health care professional if they continue or are bothersome): -loss of appetite -headache -muscle aches -nausea This list may not describe all possible side effects. Call your doctor for medical advice about side effects. You may report side effects to FDA at 1-800-FDA-1088. Where should I keep my medicine? This drug is given in a hospital or clinic and will not be stored at home. NOTE: This sheet is a summary. It may not cover all possible information. If you have questions about this medicine, talk to your doctor, pharmacist, or health care provider.  2015, Elsevier/Gold Standard. (2009-02-16 13:43:15)

## 2014-02-22 NOTE — Progress Notes (Signed)
Stacie Huang Tolerated chemotherapy well today

## 2014-02-27 ENCOUNTER — Encounter (HOSPITAL_COMMUNITY): Payer: Self-pay

## 2014-03-06 ENCOUNTER — Ambulatory Visit (INDEPENDENT_AMBULATORY_CARE_PROVIDER_SITE_OTHER): Payer: Medicare Other | Admitting: *Deleted

## 2014-03-06 DIAGNOSIS — Z7901 Long term (current) use of anticoagulants: Secondary | ICD-10-CM

## 2014-03-06 DIAGNOSIS — Z5181 Encounter for therapeutic drug level monitoring: Secondary | ICD-10-CM

## 2014-03-06 DIAGNOSIS — I4891 Unspecified atrial fibrillation: Secondary | ICD-10-CM

## 2014-03-06 LAB — POCT INR: INR: 2.6

## 2014-03-14 ENCOUNTER — Encounter (HOSPITAL_COMMUNITY): Payer: Self-pay

## 2014-03-14 ENCOUNTER — Encounter (HOSPITAL_COMMUNITY): Payer: Medicare Other | Attending: Hematology and Oncology

## 2014-03-14 DIAGNOSIS — C50212 Malignant neoplasm of upper-inner quadrant of left female breast: Secondary | ICD-10-CM | POA: Insufficient documentation

## 2014-03-14 DIAGNOSIS — Z5112 Encounter for antineoplastic immunotherapy: Secondary | ICD-10-CM

## 2014-03-14 MED ORDER — SODIUM CHLORIDE 0.9 % IV SOLN
Freq: Once | INTRAVENOUS | Status: AC
  Administered 2014-03-14: 11:00:00 via INTRAVENOUS

## 2014-03-14 MED ORDER — TRASTUZUMAB CHEMO INJECTION 440 MG
6.0000 mg/kg | Freq: Once | INTRAVENOUS | Status: AC
  Administered 2014-03-14: 420 mg via INTRAVENOUS
  Filled 2014-03-14: qty 20

## 2014-03-14 MED ORDER — DIPHENHYDRAMINE HCL 25 MG PO CAPS
50.0000 mg | ORAL_CAPSULE | Freq: Once | ORAL | Status: AC
  Administered 2014-03-14: 50 mg via ORAL
  Filled 2014-03-14: qty 2

## 2014-03-14 MED ORDER — HEPARIN SOD (PORK) LOCK FLUSH 100 UNIT/ML IV SOLN
500.0000 [IU] | Freq: Once | INTRAVENOUS | Status: AC | PRN
  Administered 2014-03-14: 500 [IU]
  Filled 2014-03-14: qty 5

## 2014-03-14 MED ORDER — SODIUM CHLORIDE 0.9 % IJ SOLN: 10.0000 mL | INTRAMUSCULAR | Status: DC | PRN

## 2014-03-14 NOTE — Patient Instructions (Addendum)
[facility] Discharge Instructions for Patients Receiving Chemotherapy  Today you received the following chemotherapy agents Herceptin. Return as scheduled for your next treatment and office visit.  To help prevent nausea and vomiting after your treatment, we encourage you to take your nausea medication.   If you develop nausea and vomiting, or diarrhea that is not controlled by your medication, call the clinic.  The clinic phone number is [phone]. Office hours are Monday-Friday 8:30am-5:00pm.  BELOW ARE SYMPTOMS THAT SHOULD BE REPORTED IMMEDIATELY:  *FEVER GREATER THAN 101.0 F  *CHILLS WITH OR WITHOUT FEVER  NAUSEA AND VOMITING THAT IS NOT CONTROLLED WITH YOUR NAUSEA MEDICATION  *UNUSUAL SHORTNESS OF BREATH  *UNUSUAL BRUISING OR BLEEDING  TENDERNESS IN MOUTH AND THROAT WITH OR WITHOUT PRESENCE OF ULCERS  *URINARY PROBLEMS  *BOWEL PROBLEMS  UNUSUAL RASH Items with * indicate a potential emergency and should be followed up as soon as possible. If you have an emergency after office hours please contact your primary care physician or go to the nearest emergency department.  Please call the clinic during office hours if you have any questions or concerns.   You may also contact the Patient Navigator at [phone] should you have any questions or need assistance in obtaining follow up care. _____________________________________________________________________ Have you asked about our STAR program?    STAR stands for Survivorship Training and Rehabilitation, and this is a nationally recognized cancer care program that focuses on survivorship and rehabilitation.  Cancer and cancer treatments may cause problems, such as, pain, making you feel tired and keeping you from doing the things that you need or want to do. Cancer rehabilitation can help. Our goal is to reduce these troubling effects and help you have the best quality of life possible.  You may receive  a survey from a nurse that asks questions about your current state of health.  Based on the survey results, all eligible patients will be referred to the [facility] program for an evaluation so we can better serve you! A frequently asked questions sheet is available upon request.          Trastuzumab injection for infusion What is this medicine? TRASTUZUMAB (tras TOO zoo mab) is a monoclonal antibody. It targets a protein called HER2. This protein is found in some stomach and breast cancers. This medicine can stop cancer cell growth. This medicine may be used with other cancer treatments. This medicine may be used for other purposes; ask your health care provider or pharmacist if you have questions. COMMON BRAND NAME(S): Herceptin What should I tell my health care provider before I take this medicine? They need to know if you have any of these conditions: -heart disease -heart failure -infection (especially a virus infection such as chickenpox, cold sores, or herpes) -lung or breathing disease, like asthma -recent or ongoing radiation therapy -an unusual or allergic reaction to trastuzumab, benzyl alcohol, or other medications, foods, dyes, or preservatives -pregnant or trying to get pregnant -breast-feeding How should I use this medicine? This drug is given as an infusion into a vein. It is administered in a hospital or clinic by a specially trained health care professional. Talk to your pediatrician regarding the use of this medicine in children. This medicine is not approved for use in children. Overdosage: If you think you have taken too much of this medicine contact a poison control center or emergency room at once. NOTE: This medicine is only for you. Do not share this medicine with  others. What if I miss a dose? It is important not to miss a dose. Call your doctor or health care professional if you are unable to keep an appointment. What may interact with this  medicine? -cyclophosphamide -doxorubicin -warfarin This list may not describe all possible interactions. Give your health care provider a list of all the medicines, herbs, non-prescription drugs, or dietary supplements you use. Also tell them if you smoke, drink alcohol, or use illegal drugs. Some items may interact with your medicine. What should I watch for while using this medicine? Visit your doctor for checks on your progress. Report any side effects. Continue your course of treatment even though you feel ill unless your doctor tells you to stop. Call your doctor or health care professional for advice if you get a fever, chills or sore throat, or other symptoms of a cold or flu. Do not treat yourself. Try to avoid being around people who are sick. You may experience fever, chills and shaking during your first infusion. These effects are usually mild and can be treated with other medicines. Report any side effects during the infusion to your health care professional. Fever and chills usually do not happen with later infusions. What side effects may I notice from receiving this medicine? Side effects that you should report to your doctor or other health care professional as soon as possible: -breathing difficulties -chest pain or palpitations -cough -dizziness or fainting -fever or chills, sore throat -skin rash, itching or hives -swelling of the legs or ankles -unusually weak or tired Side effects that usually do not require medical attention (report to your doctor or other health care professional if they continue or are bothersome): -loss of appetite -headache -muscle aches -nausea This list may not describe all possible side effects. Call your doctor for medical advice about side effects. You may report side effects to FDA at 1-800-FDA-1088. Where should I keep my medicine? This drug is given in a hospital or clinic and will not be stored at home. NOTE: This sheet is a summary. It  may not cover all possible information. If you have questions about this medicine, talk to your doctor, pharmacist, or health care provider.  2015, Elsevier/Gold Standard. (2009-02-16 13:43:15)

## 2014-03-14 NOTE — Progress Notes (Signed)
Patient tolerated infusion well.

## 2014-03-15 ENCOUNTER — Inpatient Hospital Stay (HOSPITAL_COMMUNITY): Payer: Medicare Other

## 2014-04-02 NOTE — Progress Notes (Signed)
Stacie Bellow, MD [address] [place] [address]  Breast cancer of upper-inner quadrant of left female breast - Plan: Ambulatory referral to Physical Therapy, CBC with Differential, Comprehensive metabolic panel, Vitamin D 25 hydroxy  Vitamin D deficiency - Plan: Ambulatory referral to Physical Therapy, Vitamin D 25 hydroxy  CURRENT THERAPY: Herceptin intravenously every 3 weeks to complete one year of therapy which will be in December 2015, anastrozole 1 mg daily started 06/28/2013  INTERVAL HISTORY: Stacie Huang 73 y.o. 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 Now on Herceptin every 3 weeks alone with plans to finish treatment in December of 2015 giving the agent every 3 weeks with anastrozole started on 06/28/2013.    Breast cancer of upper-inner quadrant of left female breast   02/04/2013 Initial Diagnosis Breast cancer of upper-inner quadrant of left female breast   02/23/2013 Surgery left lumpectomy (UIQ), sentinel node biopsy--0.7cm, Node negative, ER+, PR+, HER-2/neu over expressed.   04/08/2013 - 04/14/2013 Chemotherapy Paclitaxel/Herceptin weekly x 12. Intolerance to Paclitaxel and only receiving 2 cycles   05/04/2013 - 04/05/2014 Chemotherapy Herceptin every 21 days    06/28/2013 -  Chemotherapy Anastrazole started     I personally reviewed and went over laboratory results with the patient.  The results are noted within this dictation.  I personally reviewed and went over radiographic studies with the patient.  The results are noted within this dictation.  Mammogram on 02/07/2014 was BIRADS 2.  She will be due in October 2016 for her annual exam.  She continues to tolerate Arimidex well with only mild hot flashes.  She report that they do not interfere with QOL at  this time.  If this were to change, she will inform us.  She denies arthralgias and myalgias.  She is educated about AI-induced hot flashes.  She is congratulated today on completing her last cycle of Herceptin.  This is a major milestone in her cancer care.   Oncologically, she denies any complaints.  ROS questioning is negative.  Past Medical History  Diagnosis Date  . Hypertension   . Paroxysmal atrial fibrillation   . Chronic anticoagulation   . Hyperlipidemia   . GERD (gastroesophageal reflux disease)   . Cancer   . Breast cancer     has Hyperlipidemia; Chronic anticoagulation; Atrial fibrillation; Hypertension; Vitamin D deficiency; Breast cancer of upper-inner quadrant of left female breast; Paroxysmal atrial fibrillation; Encounter for therapeutic drug monitoring; GERD (gastroesophageal reflux disease); and Bloating on her problem list.     is allergic to iohexol and sulfa antibiotics.  Stacie Huang does not currently have medications on file.  Past Surgical History  Procedure Laterality Date  . Abdominal hysterectomy    . Cholecystectomy    . Partial mastectomy with axillary sentinel lymph node biopsy Left 02/23/2013    Procedure: LEFT PARTIAL MASTECTOMY WITH AXILLARY SIMPLE NODE BIOPSY, LEFT BREAST WIDE EXCISION;  Surgeon: Scherry Ran, MD;  Location: [facility];  Service: General;  Laterality: Left;  . Axillary lymph node dissection Left 02/23/2013    Procedure: LEFT AXILLARY LYMPH NODE DISSECTION;  Surgeon: Scherry Ran, MD;  Location: [facility];  Service: General;  Laterality: Left;  . Portacath placement Right 04/07/2013  . Portacath placement Right 04/07/2013    Procedure: INSERTION PORT-A-CATH;  Surgeon: Grace Bushy  Romona Curls, MD;  Location: [facility];  Service: General;  Laterality: Right;  . 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  Colonoscopy  03/10/2002    RMR: Incomplete colonoscopy (sigmoidoscopy)/Normal rectum/Normal colon to 40 cm.     Denies any headaches, dizziness, double vision, fevers, chills, night sweats, nausea, vomiting, diarrhea, constipation, chest pain, heart palpitations, shortness of breath, blood in stool, black tarry stool, urinary pain, urinary burning, urinary frequency, hematuria.   PHYSICAL EXAMINATION  ECOG PERFORMANCE STATUS: 0 - Asymptomatic  Filed Vitals:   04/05/14 1000  BP: 119/60  Pulse: 93  Temp: 98.3 F (36.8 C)  Resp: 18    GENERAL:alert, no distress, well nourished, well developed, comfortable, cooperative and smiling, in chemotherapy recliner to start herceptin SKIN: skin color, texture, turgor are normal, no rashes or significant lesions HEAD: Normocephalic, No masses, lesions, tenderness or abnormalities EYES: normal, PERRLA, EOMI, Conjunctiva are pink and non-injected EARS: External ears normal OROPHARYNX:mucous membranes are moist  NECK: supple, no adenopathy, thyroid normal size, non-tender, without nodularity, no stridor, non-tender, trachea midline LYMPH:  no palpable lymphadenopathy BREAST:not examined LUNGS: clear to auscultation  HEART: no murmurs, no gallops, irregularly irregular, S1 normal and S2 normal ABDOMEN:abdomen soft and normal bowel sounds BACK: Back symmetric, no curvature. EXTREMITIES:less then 2 second capillary refill, no joint deformities, effusion, or inflammation, no skin discoloration, no clubbing, no cyanosis  NEURO: alert & oriented x 3 with fluent speech, no focal motor/sensory deficits, gait normal   LABORATORY DATA: CBC    Component Value Date/Time   WBC 6.9 04/05/2014 1000   RBC 4.19 04/05/2014 1000   HGB 12.4 04/05/2014 1000   HCT 39.3 04/05/2014 1000   PLT 289 04/05/2014 1000   MCV 93.8 04/05/2014 1000   MCH 29.6 04/05/2014 1000   MCHC 31.6 04/05/2014 1000   RDW 14.1 04/05/2014 1000   LYMPHSABS 2.2 04/05/2014 1000    MONOABS 0.7 04/05/2014 1000   EOSABS 0.1 04/05/2014 1000   BASOSABS 0.0 04/05/2014 1000      Chemistry      Component Value Date/Time   NA 138 04/05/2014 1000   K 4.3 04/05/2014 1000   CL 102 04/05/2014 1000   CO2 24 04/05/2014 1000   BUN 15 04/05/2014 1000   CREATININE 0.91 04/05/2014 1000      Component Value Date/Time   CALCIUM 9.1 04/05/2014 1000   ALKPHOS 52 04/05/2014 1000   AST 17 04/05/2014 1000   ALT 13 04/05/2014 1000   BILITOT 0.5 04/05/2014 1000     Lab Results  Component Value Date   LABCA2 26 03/16/2013     ASSESSMENT:  1. 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 Romona Curls, sentinel node biopsy, 0.7 cm, ER positive, PR positive, HER-2/neu overexpressed, tolerating Herceptin alone.  2. Rhinitis, controlled  3. Paroxysmal atrial fibrillation, on long-term anticoagulation.  4. Hypertension, controlled.  5. Symptomatic reflux disease.  6. Normal bone density on 08/03/2013. Repeat bone density in April 2017. 7. AI-induced hot flashes, mild.  Patient Active Problem List   Diagnosis Date Noted  . GERD (gastroesophageal reflux disease) 08/11/2013  . Bloating 08/11/2013  . Encounter for therapeutic drug monitoring 06/17/2013  . Paroxysmal atrial fibrillation 03/17/2013  . Vitamin D deficiency 03/16/2013  . Breast cancer of upper-inner quadrant of left female breast 03/16/2013  .  Atrial fibrillation   . Hypertension   . Chronic anticoagulation 11/11/2010  . Hyperlipidemia 02/26/2010     PLAN:  1. I personally reviewed and went over laboratory results with the patient.  The results are noted within this dictation. 2. I personally reviewed and went over radiographic studies with the patient.  The results are noted within this dictation.   3. MUGA as scheduled on 12/10 4. Labs as planned 5. Continue anastrozole 1 mg daily  6. Complete Herceptin today as final cycle #17 7.  Return in 3 months for follow-up   THERAPY PLAN:  She will continue AI therapy for 5 years and continue with surveillance per NCCN guidelines.   NCCN guidelines recommends the following surveillance for invasive breast cancer:  A. History and Physical exam every 4-6 months for 5 years and then every 12 months.  B. Mammography every 12 months  C. Women on Tamoxifen: annual gynecologic assessment every 12 months if uterus is present.  D. Women on aromatase inhibitor or who experience ovarian failure secondary to treatment should have monitoring of bone health with a bone mineral density determination at baseline and periodically thereafter.  E. Assess and encourage adherence to adjuvant endocrine therapy.  F. Evidence suggests that active lifestyle and achieving and maintaining an ideal body weight (20-25 BMI) may lead to optimal breast cancer outcomes.   All questions were answered. The patient knows to call the clinic with any problems, questions or concerns. We can certainly see the patient much sooner if necessary.  Patient and plan discussed with Dr. Farrel Gobble and he is in agreement with the aforementioned.   Aengus Sauceda 04/05/2014

## 2014-04-05 ENCOUNTER — Encounter (HOSPITAL_COMMUNITY): Payer: Medicare Other | Attending: Oncology | Admitting: Oncology

## 2014-04-05 ENCOUNTER — Encounter (HOSPITAL_COMMUNITY): Payer: Medicare Other | Attending: Hematology and Oncology

## 2014-04-05 VITALS — BP 119/60 | HR 93 | Temp 98.3°F | Resp 18 | Wt 144.0 lb

## 2014-04-05 DIAGNOSIS — I480 Paroxysmal atrial fibrillation: Secondary | ICD-10-CM

## 2014-04-05 DIAGNOSIS — C50212 Malignant neoplasm of upper-inner quadrant of left female breast: Secondary | ICD-10-CM | POA: Insufficient documentation

## 2014-04-05 DIAGNOSIS — J310 Chronic rhinitis: Secondary | ICD-10-CM

## 2014-04-05 DIAGNOSIS — I10 Essential (primary) hypertension: Secondary | ICD-10-CM

## 2014-04-05 DIAGNOSIS — Z170 Estrogen receptor positive status [ER+]: Secondary | ICD-10-CM

## 2014-04-05 DIAGNOSIS — Z5112 Encounter for antineoplastic immunotherapy: Secondary | ICD-10-CM

## 2014-04-05 DIAGNOSIS — E559 Vitamin D deficiency, unspecified: Secondary | ICD-10-CM | POA: Insufficient documentation

## 2014-04-05 LAB — CBC WITH DIFFERENTIAL/PLATELET
BASOS ABS: 0 10*3/uL (ref 0.0–0.1)
Basophils Relative: 0 % (ref 0–1)
Eosinophils Absolute: 0.1 10*3/uL (ref 0.0–0.7)
Eosinophils Relative: 2 % (ref 0–5)
HCT: 39.3 % (ref 36.0–46.0)
HEMOGLOBIN: 12.4 g/dL (ref 12.0–15.0)
LYMPHS PCT: 32 % (ref 12–46)
Lymphs Abs: 2.2 10*3/uL (ref 0.7–4.0)
MCH: 29.6 pg (ref 26.0–34.0)
MCHC: 31.6 g/dL (ref 30.0–36.0)
MCV: 93.8 fL (ref 78.0–100.0)
MONOS PCT: 10 % (ref 3–12)
Monocytes Absolute: 0.7 10*3/uL (ref 0.1–1.0)
NEUTROS ABS: 3.8 10*3/uL (ref 1.7–7.7)
Neutrophils Relative %: 56 % (ref 43–77)
Platelets: 289 10*3/uL (ref 150–400)
RBC: 4.19 MIL/uL (ref 3.87–5.11)
RDW: 14.1 % (ref 11.5–15.5)
WBC: 6.9 10*3/uL (ref 4.0–10.5)

## 2014-04-05 LAB — COMPREHENSIVE METABOLIC PANEL
ALT: 13 U/L (ref 0–35)
ANION GAP: 12 (ref 5–15)
AST: 17 U/L (ref 0–37)
Albumin: 3.7 g/dL (ref 3.5–5.2)
Alkaline Phosphatase: 52 U/L (ref 39–117)
BILIRUBIN TOTAL: 0.5 mg/dL (ref 0.3–1.2)
BUN: 15 mg/dL (ref 6–23)
CALCIUM: 9.1 mg/dL (ref 8.4–10.5)
CHLORIDE: 102 meq/L (ref 96–112)
CO2: 24 mEq/L (ref 19–32)
CREATININE: 0.91 mg/dL (ref 0.50–1.10)
GFR, EST AFRICAN AMERICAN: 70 mL/min — AB (ref >90)
GFR, EST NON AFRICAN AMERICAN: 61 mL/min — AB (ref >90)
Glucose, Bld: 110 mg/dL — ABNORMAL HIGH (ref 70–99)
Potassium: 4.3 mEq/L (ref 3.7–5.3)
Sodium: 138 mEq/L (ref 137–147)
Total Protein: 6.6 g/dL (ref 6.0–8.3)

## 2014-04-05 MED ORDER — SODIUM CHLORIDE 0.9 % IJ SOLN: 10.0000 mL | INTRAMUSCULAR | Status: DC | PRN

## 2014-04-05 MED ORDER — ACETAMINOPHEN 325 MG PO TABS
650.0000 mg | ORAL_TABLET | Freq: Once | ORAL | Status: AC
  Administered 2014-04-05: 650 mg via ORAL
  Filled 2014-04-05: qty 2

## 2014-04-05 MED ORDER — DIPHENHYDRAMINE HCL 25 MG PO CAPS
50.0000 mg | ORAL_CAPSULE | Freq: Once | ORAL | Status: AC
Start: 2014-04-05 — End: 2014-04-05
  Administered 2014-04-05: 50 mg via ORAL
  Filled 2014-04-05: qty 2

## 2014-04-05 MED ORDER — SODIUM CHLORIDE 0.9 % IV SOLN
Freq: Once | INTRAVENOUS | Status: AC
  Administered 2014-04-05: 10:00:00 via INTRAVENOUS

## 2014-04-05 MED ORDER — HEPARIN SOD (PORK) LOCK FLUSH 100 UNIT/ML IV SOLN
500.0000 [IU] | Freq: Once | INTRAVENOUS | Status: AC | PRN
  Administered 2014-04-05: 500 [IU]
  Filled 2014-04-05: qty 5

## 2014-04-05 MED ORDER — TRASTUZUMAB CHEMO INJECTION 440 MG
6.0000 mg/kg | Freq: Once | INTRAVENOUS | Status: AC
  Administered 2014-04-05: 420 mg via INTRAVENOUS
  Filled 2014-04-05: qty 20

## 2014-04-05 NOTE — Patient Instructions (Signed)
[employer] [facility] Discharge Instructions  RECOMMENDATIONS MADE BY THE CONSULTANT AND ANY TEST RESULTS WILL BE SENT TO YOUR REFERRING PHYSICIAN.  Continue your "chemo-pill" (Arimidex) daily.  MUGA (Heart test) tomorrow 04/06/2014 as scheduled.  Return in 3 months for follow-up with PA/MD.  Please call with any questions or concerns We have referred you to the [facility] program. Congratulations on completing Herceptin.  Merry Christmas and Happy New Year   Thank you for choosing [facility] to provide your oncology and hematology care.  To afford each patient quality time with our providers, please arrive at least 15 minutes before your scheduled appointment time.  With your help, our goal is to use those 15 minutes to complete the necessary work-up to ensure our physicians have the information they need to help with your evaluation and healthcare recommendations.    Effective January 1st, 2014, we ask that you re-schedule your appointment with our physicians should you arrive 10 or more minutes late for your appointment.  We strive to give you quality time with our providers, and arriving late affects you and other patients whose appointments are after yours.    Again, thank you for choosing [facility].  Our hope is that these requests will decrease the amount of time that you wait before being seen by our physicians.       _____________________________________________________________  Should you have questions after your visit to [facility], please contact our office at [phone] between the hours of 8:30 a.m. and 5:00 p.m.  Voicemails left after 4:30 p.m. will not be returned until the following business day.  For prescription refill requests, have your pharmacy contact our office with your prescription refill request.

## 2014-04-05 NOTE — Patient Instructions (Signed)
[facility] Discharge Instructions  RECOMMENDATIONS MADE BY THE CONSULTANT AND ANY TEST RESULTS WILL BE SENT TO YOUR REFERRING PHYSICIAN.  Today you received Heceptin. Please follow up as scheduled.    Thank you for choosing [facility] to provide your oncology and hematology care.  To afford each patient quality time with our providers, please arrive at least 15 minutes before your scheduled appointment time.  With your help, our goal is to use those 15 minutes to complete the necessary work-up to ensure our physicians have the information they need to help with your evaluation and healthcare recommendations.    Effective January 1st, 2014, we ask that you re-schedule your appointment with our physicians should you arrive 10 or more minutes late for your appointment.  We strive to give you quality time with our providers, and arriving late affects you and other patients whose appointments are after yours.    Again, thank you for choosing [facility].  Our hope is that these requests will decrease the amount of time that you wait before being seen by our physicians.       _____________________________________________________________  Should you have questions after your visit to [facility], please contact our office at [phone] between the hours of 8:30 a.m. and 4:30 p.m.  Voicemails left after 4:30 p.m. will not be returned until the following business day.  For prescription refill requests, have your pharmacy contact our office with your prescription refill request.    _______________________________________________________________  We hope that we have given you very good care.  You may receive a patient satisfaction survey in the mail, please complete it and return it as soon as possible.  We value your feedback!  _______________________________________________________________  Have you asked about our STAR program?  STAR  stands for Survivorship Training and Rehabilitation, and this is a nationally recognized cancer care program that focuses on survivorship and rehabilitation.  Cancer and cancer treatments may cause problems, such as, pain, making you feel tired and keeping you from doing the things that you need or want to do. Cancer rehabilitation can help. Our goal is to reduce these troubling effects and help you have the best quality of life possible.  You may receive a survey from a nurse that asks questions about your current state of health.  Based on the survey results, all eligible patients will be referred to the [facility] program for an evaluation so we can better serve you!  A frequently asked questions sheet is available upon request.

## 2014-04-05 NOTE — Progress Notes (Signed)
Patient tolerated treatment well.

## 2014-04-05 NOTE — Progress Notes (Signed)
STAR Program Physical Impairment and Functional Assessment Screening Tool  1. Are you having any pain, including headaches, joint pain, or muscle pain (upper body = OT; lower body = PT)?  Yes, but I hand this before my cancer diagnosis.  2. Do your hands and/or feet feel numb or tingle (PT)?  No  3. Does any part of your body feel swollen or larger than usual (upper body = OT; lower body = PT)?  No  4. Are you so tired that you cannot do the things you want or need to do (PT or OT)?  Yes, this started after my diagnosis and is still a problem.  5. Are you feeling weak or are you having trouble moving any part of your body (PT/OT)?  Yes, this started after my diagnosis and is still a problem.  6. Are you having trouble concentrating, thinking, or remembering things (OT/ST)?  Yes, this started after my diagnosis and is still a problem.  7. Are you having trouble moving around or feel like you might trip or fall (PT)?  Yes, this started after my diagnosis and is still a problem.  8. Are you having trouble swallowing (ST)?  No  9. Are you having trouble speaking (ST)?  No  10. Are you having trouble with going or getting to the bathroom (OT)?  No  11. Are you having trouble with your sexual function (OT)?  No  12. Are you having trouble lifting things, even just your arms (OT/PT)?  Yes, this started after my diagnosis and is still a problem.  7. Are you having trouble taking care of yourself as in dressing or bathing (OT)?  No  14. Are you having trouble with daily tasks like chores or shopping (OT)?  Yes, this started after my diagnosis and is still a problem.  15. Are you having trouble driving (OT)?  No  16. Are you having trouble returning to work or completing your tasks at work (OT)?  No  Other concerns:    Legend: OT = Occupational Therapy PT = Physical Therapy ST = Speech Therapy

## 2014-04-06 ENCOUNTER — Encounter (HOSPITAL_COMMUNITY): Payer: Self-pay

## 2014-04-06 ENCOUNTER — Encounter (HOSPITAL_COMMUNITY)
Admission: RE | Admit: 2014-04-06 | Discharge: 2014-04-06 | Disposition: A | Discharge status: Home / Self Care | Payer: Medicare Other | Source: Ambulatory Visit | Attending: Oncology | Admitting: Oncology

## 2014-04-06 DIAGNOSIS — Z79899 Other long term (current) drug therapy: Secondary | ICD-10-CM | POA: Insufficient documentation

## 2014-04-06 DIAGNOSIS — C50212 Malignant neoplasm of upper-inner quadrant of left female breast: Secondary | ICD-10-CM | POA: Insufficient documentation

## 2014-04-06 MED ORDER — TECHNETIUM TC 99M-LABELED RED BLOOD CELLS IV KIT
25.0000 | PACK | Freq: Once | INTRAVENOUS | Status: AC | PRN
  Administered 2014-04-06: 26 via INTRAVENOUS

## 2014-04-06 MED ORDER — SODIUM CHLORIDE 0.9 % IJ SOLN
INTRAMUSCULAR | Status: AC
  Filled 2014-04-06: qty 6

## 2014-04-06 MED ORDER — HEPARIN SOD (PORK) LOCK FLUSH 100 UNIT/ML IV SOLN
INTRAVENOUS | Status: AC
  Filled 2014-04-06: qty 5

## 2014-04-17 ENCOUNTER — Ambulatory Visit (INDEPENDENT_AMBULATORY_CARE_PROVIDER_SITE_OTHER): Payer: Medicare Other | Admitting: *Deleted

## 2014-04-17 DIAGNOSIS — Z5181 Encounter for therapeutic drug level monitoring: Secondary | ICD-10-CM

## 2014-04-17 DIAGNOSIS — Z7901 Long term (current) use of anticoagulants: Secondary | ICD-10-CM

## 2014-04-17 DIAGNOSIS — I4891 Unspecified atrial fibrillation: Secondary | ICD-10-CM

## 2014-04-17 LAB — POCT INR: INR: 2.4

## 2014-04-18 ENCOUNTER — Ambulatory Visit: Payer: Medicare Other | Admitting: Gastroenterology

## 2014-04-18 ENCOUNTER — Ambulatory Visit (HOSPITAL_COMMUNITY): Payer: Medicare Other | Admitting: Physical Therapy

## 2014-05-03 ENCOUNTER — Ambulatory Visit (HOSPITAL_COMMUNITY)
Admission: RE | Admit: 2014-05-03 | Discharge: 2014-05-03 | Disposition: A | Discharge status: Home / Self Care | Payer: Medicare Other | Source: Ambulatory Visit | Attending: Oncology | Admitting: Oncology

## 2014-05-03 DIAGNOSIS — R5383 Other fatigue: Secondary | ICD-10-CM | POA: Insufficient documentation

## 2014-05-03 DIAGNOSIS — R29898 Other symptoms and signs involving the musculoskeletal system: Secondary | ICD-10-CM | POA: Diagnosis not present

## 2014-05-03 NOTE — Therapy (Signed)
[place], [place], [address] Phone: [phone]   Fax:  [phone]  Physical Therapy Evaluation  Patient Details  Name: Stacie Huang MRN: [id number] Date of Birth: [birth year] Referring Provider:  Baird Cancer, PA-C  Encounter Date: 05/03/2014      PT End of Session - 05/03/14 1655    Visit Number 1   Number of Visits 8   Date for PT Re-Evaluation 07/02/14   Authorization Type medicare   Authorization - Visit Number 1   Authorization - Number of Visits 8   PT Start Time 1440   PT Stop Time 1515   PT Time Calculation (min) 35 min      Past Medical History  Diagnosis Date  . Hypertension   . Paroxysmal atrial fibrillation   . Chronic anticoagulation   . Hyperlipidemia   . GERD (gastroesophageal reflux disease)   . Cancer   . Breast cancer     Past Surgical History  Procedure Laterality Date  . Abdominal hysterectomy    . Cholecystectomy    . Partial mastectomy with axillary sentinel lymph node biopsy Left 02/23/2013    Procedure: LEFT PARTIAL MASTECTOMY WITH AXILLARY SIMPLE NODE BIOPSY, LEFT BREAST WIDE EXCISION;  Surgeon: Scherry Ran, MD;  Location: [facility];  Service: General;  Laterality: Left;  . Axillary lymph node dissection Left 02/23/2013    Procedure: LEFT AXILLARY LYMPH NODE DISSECTION;  Surgeon: Scherry Ran, MD;  Location: [facility];  Service: General;  Laterality: Left;  . Portacath placement Right 04/07/2013  . Portacath placement Right 04/07/2013    Procedure: INSERTION PORT-A-CATH;  Surgeon: Scherry Ran, MD;  Location: [facility];  Service: General;  Laterality: Right;  . Esophagogastroduodenoscopy  03/10/2002    FHQ:RFXJOI esophagus/small HH/Tiny AVM in antrum, otherwise normal stomach and D1 and D2/Status post passage of the [facility] dilator  . Colonoscopy  03/10/2002    RMR: Incomplete colonoscopy (sigmoidoscopy)/Normal rectum/Normal colon to 40 cm.     There were  no vitals taken for this visit.  Visit Diagnosis:  Other fatigue  Leg weakness, bilateral      Subjective Assessment - 05/03/14 1446    Symptoms Stacie Huang was diagnosed with Lt breast cancer in October of 2014 and has been going through chemotherapy throughout 2015; her last treatment was December of 2015.  The patient states that she lives by herself and feels weak.  She states it is difficult for her to sweep or mop her floors due to the fact that she is so tired.  She is not on any kind of walking program.     How long can you sit comfortably? no problem    How long can you stand comfortably? five minutes    How long can you walk comfortably? ten minues    Patient Stated Goals To be able to do her housework and cook like she use to.     Currently in Pain? No/denies          [facility] PT Assessment - 05/03/14 0001    Assessment   Medical Diagnosis weakness    Onset Date 01/31/14   Next MD Visit 06/11/2013   Prior Therapy none   Precautions   Precautions None   Restrictions   Weight Bearing Restrictions No   Balance Screen   Has the patient fallen in the past 6 months No   Has the patient had a decrease in activity level because of a  fear of falling?  Yes   Is the patient reluctant to leave their home because of a fear of falling?  Yes   Prior Function   Level of Independence Independent with basic ADLs   Vocation Retired   Leisure read, puzzles, cook, tv   Cognition   Overall Cognitive Status Within Functional Limits for tasks assessed   Observation/Other Assessments   Other Surveys  --  TUG 11.4; FACIT-F 63.8; VAS 8.33, Pain analog 6; Fatigue 6.6   Strength   Right Hip Flexion 5/5   Right Hip Extension 4/5   Right Hip ABduction 4/5   Left Hip Flexion 4/5   Left Hip Extension 4/5   Left Hip ABduction 4/5   Right Knee Flexion 5/5   Right Knee Extension 5/5   Left Knee Flexion 5/5   Left Knee Extension 5/5   Right Ankle Dorsiflexion 4/5   Left Ankle Dorsiflexion 5/5                 PT Short Term Goals - 05/03/14 1702    PT SHORT TERM GOAL #1   Title Pt to be walking 15 minutes at least 4 times a week    Time 4   Period Weeks   PT SHORT TERM GOAL #2   Title Pt to state that she is able to stand for 10 minutes to make a simple meal    Time 4   Period Weeks   PT SHORT TERM GOAL #3   Title strength to be improved by 1/2 grade    Time 4   Period Weeks           PT Long Term Goals - 05/03/14 1704    PT LONG TERM GOAL #1   Title Pt to be walking 25 minutes a day for at least 4 days a week for better health habits   Time 8   Period Weeks   PT LONG TERM GOAL #2   Title Pt SLS to be at least 10 seconds to decrease risk of falling    Time 8   Period Weeks   PT LONG TERM GOAL #3   Title Pt to be able to stand for 20 minutes to make a meal    Time 8   Period Weeks               Plan - 05/03/14 1656    Clinical Impression Statement Stacie Huang was diagnosed with breast cancer in 2014 and recieved chemotherapy throughout 2015.  She is now done with her chemo but continues to have significant fatigue.  She is very inactive at this point.  We diiscussed the need to increase her activity, begin logging her steps with the pedometer.  She has been referred  and will benefit from skilled therapy to help her return to her previous active self.  Due to transportation issues she is requesting to come to therapy one time a week to be progressed in a HEP    Pt will benefit from skilled therapeutic intervention in order to improve on the following deficits Decreased activity tolerance;Decreased balance;Decreased endurance;Decreased strength   Rehab Potential Good   PT Frequency 1x / week   PT Duration 8 weeks   PT Treatment/Interventions Patient/family education;Balance training;Therapeutic exercise;Therapeutic activities;Functional mobility training   PT Next Visit Plan Pt will need handout and instruction of the following:  postural T-band  exercises; heel raise , squats, lateral and forward step ups, SLS;  progress to lunges,  side lunges, sit to stand, vector stances, Warrior I and II,   Encourage pt to begin household activities ie making a meal, cleaning house as well as walking every day           G-Codes - 05/22/14 1706    Functional Limitation Mobility: Walking and moving around   Mobility: Walking and Moving Around Current Status [phone]) At least 60 percent but less than 80 percent impaired, limited or restricted   Mobility: Walking and Moving Around Goal Status [phone]) At least 20 percent but less than 40 percent impaired, limited or restricted       Problem List Patient Active Problem List   Diagnosis Date Noted  . GERD (gastroesophageal reflux disease) 08/11/2013  . Bloating 08/11/2013  . Encounter for therapeutic drug monitoring 06/17/2013  . Paroxysmal atrial fibrillation 03/17/2013  . Vitamin D deficiency 03/16/2013  . Breast cancer of upper-inner quadrant of left female breast 03/16/2013  . Atrial fibrillation   . Hypertension   . Chronic anticoagulation 11/11/2010  . Hyperlipidemia 02/26/2010    RUSSELL,CINDY PT 2014-05-22, 5:07 PM  [place], [place], [address] Phone: [phone]   Fax:  [phone]

## 2014-05-03 NOTE — Patient Instructions (Signed)
Bridging   Slowly raise buttocks from floor, keeping stomach tight. Repeat __10__ times per set. Do ___1_ sets per session. Do ___2_ sessions per day.  http://orth.exer.us/1096   Copyright  VHI. All rights reserved.  Straight Leg Raise   Tighten stomach and slowly raise locked right leg ___18_ inches from floor. Repeat _10___ times per set. Do _1___ sets per session. Do _2___ sessions per day.  http://orth.exer.us/1102   Copyright  VHI. All rights reserved.  Strengthening: Hip Abduction (Side-Lying)   Tighten muscles on front of left thigh, then lift leg __18__ inches from surface, keeping knee locked.  Repeat ___10_ times per set. Do 1____ sets per session. Do _2___ sessions per day.  http://orth.exer.us/622   Copyright  VHI. All rights reserved.  Strengthening: Hip Abduction (Side-Lying)   Tighten muscles on front of left thigh, then lift leg ____ inches from surface, keeping knee locked.  Repeat ____ times per set. Do ____ sets per session. Do ____ sessions per day.  http://orth.exer.us/622   Copyright  VHI. All rights reserved.  Functional Quadriceps: Chair Squat   Keeping feet flat on floor, shoulder width apart, squat as low as is comfortable. Use support as necessary. Repeat __10__ times per set. Do ___1_ sets per session. Do __2__ sessions per day.  http://orth.exer.us/736   Copyright  VHI. All rights reserved.

## 2014-05-10 ENCOUNTER — Ambulatory Visit (HOSPITAL_COMMUNITY)
Admission: RE | Admit: 2014-05-10 | Discharge: 2014-05-10 | Disposition: A | Discharge status: Home / Self Care | Payer: Medicare Other | Source: Ambulatory Visit | Attending: Oncology | Admitting: Oncology

## 2014-05-10 DIAGNOSIS — R29898 Other symptoms and signs involving the musculoskeletal system: Secondary | ICD-10-CM

## 2014-05-10 DIAGNOSIS — R5383 Other fatigue: Secondary | ICD-10-CM | POA: Diagnosis not present

## 2014-05-10 NOTE — Therapy (Signed)
[place], [place], [address] Phone: [phone]   Fax:  [phone]  Physical Therapy Treatment  Patient Details  Name: Stacie Huang MRN: [id number] Date of Birth: 1940/08/23 Referring Provider:  Baird Cancer, PA-C  Encounter Date: 05/10/2014      PT End of Session - 05/10/14 1508    Visit Number 2   Number of Visits 8   Date for PT Re-Evaluation 07/02/14   Authorization Type medicare   Authorization - Visit Number 2   Authorization - Number of Visits 8   PT Start Time 1430   PT Stop Time 1508   PT Time Calculation (min) 38 min   Activity Tolerance Patient tolerated treatment well   Behavior During Therapy [facility] for tasks assessed/performed      Past Medical History  Diagnosis Date  . Hypertension   . Paroxysmal atrial fibrillation   . Chronic anticoagulation   . Hyperlipidemia   . GERD (gastroesophageal reflux disease)   . Cancer   . Breast cancer     Past Surgical History  Procedure Laterality Date  . Abdominal hysterectomy    . Cholecystectomy    . Partial mastectomy with axillary sentinel lymph node biopsy Left 02/23/2013    Procedure: LEFT PARTIAL MASTECTOMY WITH AXILLARY SIMPLE NODE BIOPSY, LEFT BREAST WIDE EXCISION;  Surgeon: Scherry Ran, MD;  Location: [facility];  Service: General;  Laterality: Left;  . Axillary lymph node dissection Left 02/23/2013    Procedure: LEFT AXILLARY LYMPH NODE DISSECTION;  Surgeon: Scherry Ran, MD;  Location: [facility];  Service: General;  Laterality: Left;  . Portacath placement Right 04/07/2013  . Portacath placement Right 04/07/2013    Procedure: INSERTION PORT-A-CATH;  Surgeon: Scherry Ran, MD;  Location: [facility];  Service: General;  Laterality: Right;  . Esophagogastroduodenoscopy  03/10/2002    TKZ:SWFUXN esophagus/small HH/Tiny AVM in antrum, otherwise normal stomach and D1 and D2/Status post passage of the [facility] dilator  . Colonoscopy   03/10/2002    RMR: Incomplete colonoscopy (sigmoidoscopy)/Normal rectum/Normal colon to 40 cm.     There were no vitals taken for this visit.  Visit Diagnosis:  Other fatigue  Leg weakness, bilateral      Subjective Assessment - 05/10/14 1539    Symptoms Pt states her calves are sore today.  pt reports she has been trying to increase her actvitiy and walking more.  Currently without pain, only soreness.                    [facility] PT Treatment/Exercise - 05/10/14 1528    Knee/Hip Exercises: Stretches   Gastroc Stretch 3 reps;30 seconds   Gastroc Stretch Limitations against wall   Knee/Hip Exercises: Aerobic   Stationary Bike nustep 10' level 2 hills #1   Knee/Hip Exercises: Standing   Heel Raises 10 reps;Limitations   Heel Raises Limitations toeraises   Functional Squat 10 reps   SLS max of 5 each Rt: 5", Lt: 7"   Other Standing Knee Exercises postural 3 theraband red 10 reps each                  PT Short Term Goals - 05/03/14 1702    PT SHORT TERM GOAL #1   Title Pt to be walking 15 minutes at least 4 times a week    Time 4   Period Weeks   PT SHORT TERM GOAL #2   Title Pt to  state that she is able to stand for 10 minutes to make a simple meal    Time 4   Period Weeks   PT SHORT TERM GOAL #3   Title strength to be improved by 1/2 grade    Time 4   Period Weeks           PT Long Term Goals - 05/03/14 1704    PT LONG TERM GOAL #1   Title Pt to be walking 25 minutes a day for at least 4 days a week for better health habits   Time 8   Period Weeks   PT LONG TERM GOAL #2   Title Pt SLS to be at least 10 seconds to decrease risk of falling    Time 8   Period Weeks   PT LONG TERM GOAL #3   Title Pt to be able to stand for 20 minutes to make a meal    Time 8   Period Weeks            Plan - 05/10/14 1511    Clinical Impression Statement Pt instructed with heel/toe raises, squats, gastroc stretch, SLS and postural theraband.  Pt  given theraband and written handouts.  Pt presented with good form and able to demonstrate all actvities without questions.  completed session with nustep to continue to progress actvivity tolerance.  Pt encouraged to continue to progress her walking at home.     PT Next Visit Plan continue with weekly instruction with written handouts.  Add lateral and forward step ups.    progress to lunges, side lunges, sit to stand, vector stances, Warrior I and II,         Problem List Patient Active Problem List   Diagnosis Date Noted  . GERD (gastroesophageal reflux disease) 08/11/2013  . Bloating 08/11/2013  . Encounter for therapeutic drug monitoring 06/17/2013  . Paroxysmal atrial fibrillation 03/17/2013  . Vitamin D deficiency 03/16/2013  . Breast cancer of upper-inner quadrant of left female breast 03/16/2013  . Atrial fibrillation   . Hypertension   . Chronic anticoagulation 11/11/2010  . Hyperlipidemia 02/26/2010    Teena Irani, PTA/CLT [phone] 05/10/2014, 3:40 PM  [place] [address] [place], [place], [address] Phone: [phone]   Fax:  [phone]

## 2014-05-10 NOTE — Patient Instructions (Addendum)
        Gently rock back on heels and raise toes. Then rock forward on toes and raise heels. Repeat sequence ____ times per session. Do ____ sessions per week.  Copyright  VHI. All rights reserved.  Mini Squat: Double Leg   With feet shoulder width apart, reach forward for balance and do a mini squat. Keep knees in line with second toe. Knees do not go past toes. Repeat _10__ times per set. Rest __10_ seconds after set. Do __2_ times per day.  http://plyo.exer.us/70     Copyright  VHI. All rights reserved.  Single Leg Balance: Eyes Open   Stand on right leg with eyes open. Hold _30__ seconds. 5_ reps __2 times per day.  http://ggbe.exer.us/5   Copyright  VHI. All rights reserved.  Calf Stretch   Place one leg forward, bent, other leg behind and straight. Lean forward keeping back heel flat. Hold __30__ seconds while counting out loud. Repeat with other leg forward. Repeat ___3_ times. Do __2_ sessions per day.  http://gt2.exer.us/478    Place one leg forward, bent, other leg behind and straight. Lean forward keeping back heel flat. Hold ____ seconds while counting out loud. Repeat with other leg forward. Repeat ____ times. Do ____ sessions per day.  http://gt2.exer.us/478   Copyright  VHI. All rights reserved.   Heel Raises   Stand with support. Tighten pelvic floor and hold. With knees straight, raise heels off ground. Hold ___ seconds. Relax for ___ seconds. Repeat _10times.Do _2ANKLE: Dorsiflexion - Standing   Stand with upright posture. Raise toes of both feet up at same time. __10reps per set, __2 sets per day, Hold onto a support.  Copyright  VHI. All rights reserved.  _ times a day.  Copyright  VHI. All rights reserved.

## 2014-05-11 ENCOUNTER — Ambulatory Visit (INDEPENDENT_AMBULATORY_CARE_PROVIDER_SITE_OTHER): Payer: Medicare Other | Admitting: Gastroenterology

## 2014-05-11 ENCOUNTER — Encounter: Payer: Self-pay | Admitting: Gastroenterology

## 2014-05-11 VITALS — BP 110/63 | HR 77 | Temp 98.2°F | Ht 61.0 in | Wt 143.6 lb

## 2014-05-11 DIAGNOSIS — K219 Gastro-esophageal reflux disease without esophagitis: Secondary | ICD-10-CM

## 2014-05-11 DIAGNOSIS — Z1211 Encounter for screening for malignant neoplasm of colon: Secondary | ICD-10-CM | POA: Insufficient documentation

## 2014-05-11 NOTE — Patient Instructions (Signed)
1. Continue protonix twice daily for now. We can try getting you back to once daily at some point, consider at your next office visit.  Return to the office in six months.  2. Recommend screening colonoscopy whenever you are ready and have had time to recover from your cancer treatments, we can discuss again at your next office visit.

## 2014-05-11 NOTE — Assessment & Plan Note (Signed)
Doing very well on current regimen including pantoprazole 40 mg twice a day. Just completing chemotherapy. Patient desires continue current regimen given that she finally is feeling well. Readdress at her next office visit in 6 months.

## 2014-05-11 NOTE — Assessment & Plan Note (Signed)
Overdue for screening colonoscopy. Patient does not want to pursue at this time given that she just completed chemotherapy for breast cancer. This is a very reasonable approach and we will discuss possible colonoscopy at her next office visit in 6 months if she desires.

## 2014-05-11 NOTE — Progress Notes (Signed)
Primary Care Physician: [name], MD  Primary Gastroenterologist:  Garfield Cornea, MD   Chief Complaint  Patient presents with  . Dysphagia  . Gastrophageal Reflux    HPI: Stacie Huang is a 74 y.o. female here for follow-up of GERD and dyspepsia. Last seen in the office back in May 2015. Recently completed chemotherapy for breast cancer. Overall she feels well. Heartburn well-controlled. Denies any bloating. Appetite is good. Is on pantoprazole 40 mg twice a day since May 2015. Has done very well since that time and is fearful of trying to go back on once daily therapy. Bowel movements are regular. Appetite is good. Denies melena or rectal bleeding. No abdominal pain. Last colonoscopy in 2003 and rightfully so she does not want to pursue colonoscopy in the near future given that she just completed chemotherapy. Plans to discuss at her next office visit.   Current Outpatient Prescriptions  Medication Sig Dispense Refill  . acetaminophen (TYLENOL) 500 MG tablet Take 1,000 mg by mouth daily.    Marland Kitchen anastrozole (ARIMIDEX) 1 MG tablet Take 1 tablet (1 mg total) by mouth daily. 30 tablet 3  . atenolol (TENORMIN) 100 MG tablet Take 50 mg by mouth daily before breakfast.     . calcium carbonate (OS-CAL) 600 MG TABS tablet Take 600 mg by mouth daily.    . cetirizine (ZYRTEC) 10 MG tablet Take 10 mg by mouth every morning.     . Cholecalciferol (VITAMIN D) 400 UNITS capsule Take 400 Units by mouth every morning.     . diltiazem (TIAZAC) 120 MG 24 hr capsule Take 1 capsule (120 mg total) by mouth every morning. 30 capsule 6  . fluticasone (FLONASE) 50 MCG/ACT nasal spray Place 2 sprays into the nose at bedtime.     Marland Kitchen LORazepam (ATIVAN) 1 MG tablet Take 1 mg by mouth daily as needed for anxiety (AND/OR FOR SLEEP).     Marland Kitchen losartan (COZAAR) 50 MG tablet Take 50 mg by mouth every morning.     . metoCLOPramide (REGLAN) 10 MG tablet Take 1 tablet (10 mg total) by mouth 4 (four) times daily -   before meals and at bedtime. 120 tablet 5  . pantoprazole (PROTONIX) 40 MG tablet Take 1 tablet (40 mg total) by mouth 2 (two) times daily before a meal. 60 tablet 3  . psyllium (METAMUCIL) 58.6 % powder Take 1 packet by mouth daily.     . simvastatin (ZOCOR) 10 MG tablet Take 10 mg by mouth at bedtime.      . triamcinolone cream (KENALOG) 0.1 % Apply 1 application topically at bedtime.    Marland Kitchen warfarin (COUMADIN) 2.5 MG tablet Takes 2.5 mg (1 tablet) on Tuesday and Friday takes 1.25 mg (0.5 tablet) all other days 30 tablet 6   No current facility-administered medications for this visit.    Allergies as of 05/11/2014 - Review Complete 05/11/2014  Allergen Reaction Noted  . Iohexol Hives 03/17/2008  . Sulfa antibiotics Rash 05/26/2011   Past Medical History  Diagnosis Date  . Hypertension   . Paroxysmal atrial fibrillation   . Chronic anticoagulation   . Hyperlipidemia   . GERD (gastroesophageal reflux disease)   . Cancer   . Breast cancer    Past Surgical History  Procedure Laterality Date  . Abdominal hysterectomy    . Cholecystectomy    . Partial mastectomy with axillary sentinel lymph node biopsy Left 02/23/2013    Procedure: LEFT PARTIAL MASTECTOMY WITH AXILLARY  SIMPLE NODE BIOPSY, LEFT BREAST WIDE EXCISION;  Surgeon: Scherry Ran, MD;  Location: [facility];  Service: General;  Laterality: Left;  . Axillary lymph node dissection Left 02/23/2013    Procedure: LEFT AXILLARY LYMPH NODE DISSECTION;  Surgeon: Scherry Ran, MD;  Location: [facility];  Service: General;  Laterality: Left;  . Portacath placement Right 04/07/2013  . Portacath placement Right 04/07/2013    Procedure: INSERTION PORT-A-CATH;  Surgeon: Scherry Ran, MD;  Location: [facility];  Service: General;  Laterality: Right;  . Esophagogastroduodenoscopy  03/10/2002    RWE:RXVQMG esophagus/small HH/Tiny AVM in antrum, otherwise normal stomach and D1 and D2/Status post passage of the [facility] dilator  .  Colonoscopy  03/10/2002    RMR: Incomplete colonoscopy (sigmoidoscopy)/Normal rectum/Normal colon to 40 cm.    Family History  Problem Relation Age of Onset  . Heart disease Mother     Also 24 of 9 siblings  . Heart attack Father   . Cancer      2 siblings  . Leukemia     History   Social History  . Marital Status: Legally Separated    Spouse Name: N/A    Number of Children:  2  . Years of Education: N/A   Occupational History  . Retired from [employer]  . Smoking status: Never Smoker   . Smokeless tobacco: Never Used  . Alcohol Use: No  . Drug Use: No  . Sexual Activity: Yes    Birth Control/ Protection: Surgical   Other Topics Concern  . Not on file   Social History Narrative   2 adult children    ROS:  General: Negative for anorexia, weight loss, fever, chills, fatigue, weakness. ENT: Negative for hoarseness, difficulty swallowing , nasal congestion. CV: Negative for chest pain, angina, palpitations, dyspnea on exertion, peripheral edema.  Respiratory: Negative for dyspnea at rest, dyspnea on exertion, cough, sputum, wheezing.  GI: See history of present illness. GU:  Negative for dysuria, hematuria, urinary incontinence, urinary frequency, nocturnal urination.  Endo: Negative for unusual weight change.    Physical Examination:   BP 110/63 mmHg  Pulse 77  Temp(Src) 98.2 F (36.8 C) (Oral)  Ht 5\' 1"  (1.549 m)  Wt 143 lb 9.6 oz (65.137 kg)  BMI 27.15 kg/m2  General: Well-nourished, well-developed in no acute distress.  Eyes: No icterus. Mouth: Oropharyngeal mucosa moist and pink , no lesions erythema or exudate. Lungs: Clear to auscultation bilaterally.  Heart: Regular rate and rhythm, no murmurs rubs or gallops.  Abdomen: Bowel sounds are normal, nontender, nondistended, no hepatosplenomegaly or masses, no abdominal bruits or hernia , no rebound or guarding.   Extremities: No lower extremity edema. No clubbing  or deformities. Neuro: Alert and oriented x 4   Skin: Warm and dry, no jaundice.   Psych: Alert and cooperative, normal mood and affect.  Labs:  Lab Results  Component Value Date   INR 2.4 04/17/2014   INR 2.6 03/06/2014   INR 2.2 01/26/2014   Lab Results  Component Value Date   CREATININE 0.91 04/05/2014   BUN 15 04/05/2014   NA 138 04/05/2014   K 4.3 04/05/2014   CL 102 04/05/2014   CO2 24 04/05/2014   Lab Results  Component Value Date   ALT 13 04/05/2014   AST 17 04/05/2014   ALKPHOS 52 04/05/2014   BILITOT 0.5 04/05/2014   Lab Results  Component Value Date   WBC 6.9  04/05/2014   HGB 12.4 04/05/2014   HCT 39.3 04/05/2014   MCV 93.8 04/05/2014   PLT 289 04/05/2014     Imaging Studies: No results found.

## 2014-05-17 NOTE — Progress Notes (Signed)
cc'ed to pcp °

## 2014-05-19 ENCOUNTER — Encounter (HOSPITAL_COMMUNITY): Payer: Medicare Other

## 2014-05-19 ENCOUNTER — Ambulatory Visit (HOSPITAL_COMMUNITY): Payer: Medicare Other | Admitting: Physical Therapy

## 2014-05-23 ENCOUNTER — Ambulatory Visit (HOSPITAL_COMMUNITY): Payer: Medicare Other | Admitting: Physical Therapy

## 2014-05-24 ENCOUNTER — Ambulatory Visit (HOSPITAL_COMMUNITY): Payer: Medicare Other | Admitting: Physical Therapy

## 2014-05-24 ENCOUNTER — Encounter (HOSPITAL_COMMUNITY): Payer: Medicare Other

## 2014-05-29 ENCOUNTER — Ambulatory Visit (INDEPENDENT_AMBULATORY_CARE_PROVIDER_SITE_OTHER): Payer: Medicare Other | Admitting: *Deleted

## 2014-05-29 DIAGNOSIS — I480 Paroxysmal atrial fibrillation: Secondary | ICD-10-CM

## 2014-05-29 DIAGNOSIS — Z7901 Long term (current) use of anticoagulants: Secondary | ICD-10-CM

## 2014-05-29 DIAGNOSIS — Z5181 Encounter for therapeutic drug level monitoring: Secondary | ICD-10-CM

## 2014-05-29 LAB — POCT INR: INR: 2.3

## 2014-05-30 ENCOUNTER — Encounter (HOSPITAL_COMMUNITY): Payer: Medicare Other | Attending: Hematology and Oncology

## 2014-05-30 DIAGNOSIS — C50212 Malignant neoplasm of upper-inner quadrant of left female breast: Secondary | ICD-10-CM

## 2014-05-30 DIAGNOSIS — Z452 Encounter for adjustment and management of vascular access device: Secondary | ICD-10-CM

## 2014-05-30 DIAGNOSIS — Z95828 Presence of other vascular implants and grafts: Secondary | ICD-10-CM

## 2014-05-30 MED ORDER — HEPARIN SOD (PORK) LOCK FLUSH 100 UNIT/ML IV SOLN
500.0000 [IU] | Freq: Once | INTRAVENOUS | Status: AC
  Administered 2014-05-30: 500 [IU] via INTRAVENOUS

## 2014-05-30 MED ORDER — SODIUM CHLORIDE 0.9 % IJ SOLN
10.0000 mL | Freq: Once | INTRAMUSCULAR | Status: AC
  Administered 2014-05-30: 10 mL via INTRAVENOUS

## 2014-05-30 NOTE — Patient Instructions (Signed)
[facility] at [facility] Discharge Instructions  RECOMMENDATIONS MADE BY THE CONSULTANT AND ANY TEST RESULTS WILL BE SENT TO YOUR REFERRING PHYSICIAN.  Port flush today. Continue port flush every 6 weeks.  Thank you for choosing [facility] at [facility] to provide your oncology and hematology care.  To afford each patient quality time with our provider, please arrive at least 15 minutes before your scheduled appointment time.    You need to re-schedule your appointment should you arrive 10 or more minutes late.  We strive to give you quality time with our providers, and arriving late affects you and other patients whose appointments are after yours.  Also, if you no show three or more times for appointments you may be dismissed from the clinic at the providers discretion.     Again, thank you for choosing [facility].  Our hope is that these requests will decrease the amount of time that you wait before being seen by our physicians.       _____________________________________________________________  Should you have questions after your visit to [facility], please contact our office at [phone] between the hours of 8:30 a.m. and 4:30 p.m.  Voicemails left after 4:30 p.m. will not be returned until the following business day.  For prescription refill requests, have your pharmacy contact our office.    

## 2014-05-30 NOTE — Progress Notes (Signed)
Stacie Huang presented for Portacath access and flush. Proper placement of portacath confirmed by CXR. Portacath located right chest wall accessed with  H 20 needle. Good blood return present. Portacath flushed with 20ml NS and 500U/5ml Heparin and needle removed intact. Procedure without incident. Patient tolerated procedure well.   

## 2014-06-05 ENCOUNTER — Ambulatory Visit (HOSPITAL_COMMUNITY): Payer: Medicare Other | Admitting: Physical Therapy

## 2014-06-14 ENCOUNTER — Ambulatory Visit (HOSPITAL_COMMUNITY): Payer: Medicare Other | Attending: Oncology | Admitting: Physical Therapy

## 2014-06-14 DIAGNOSIS — R5383 Other fatigue: Secondary | ICD-10-CM | POA: Diagnosis present

## 2014-06-14 DIAGNOSIS — R29898 Other symptoms and signs involving the musculoskeletal system: Secondary | ICD-10-CM | POA: Diagnosis not present

## 2014-06-14 NOTE — Therapy (Signed)
Stacie Huang, [place], [address] Phone: [phone]   Fax:  [phone]  Physical Therapy Treatment  Patient Details  Name: Stacie Huang MRN: [id number] Date of Birth: [birth year] Referring Provider:  Baird Cancer, PA-C  Encounter Date: 06/14/2014      PT End of Session - 06/14/14 1524    Visit Number 3   Number of Visits 8   Date for PT Re-Evaluation 07/02/14   Authorization Type medicare   Authorization - Visit Number 3   Authorization - Number of Visits 8   Activity Tolerance Patient tolerated treatment well;Patient limited by fatigue   Behavior During Therapy [facility] for tasks assessed/performed      Past Medical History  Diagnosis Date  . Hypertension   . Paroxysmal atrial fibrillation   . Chronic anticoagulation   . Hyperlipidemia   . GERD (gastroesophageal reflux disease)   . Cancer   . Breast cancer     Past Surgical History  Procedure Laterality Date  . Abdominal hysterectomy    . Cholecystectomy    . Partial mastectomy with axillary sentinel lymph node biopsy Left 02/23/2013    Procedure: LEFT PARTIAL MASTECTOMY WITH AXILLARY SIMPLE NODE BIOPSY, LEFT BREAST WIDE EXCISION;  Surgeon: Scherry Ran, MD;  Location: [facility];  Service: General;  Laterality: Left;  . Axillary lymph node dissection Left 02/23/2013    Procedure: LEFT AXILLARY LYMPH NODE DISSECTION;  Surgeon: Scherry Ran, MD;  Location: [facility];  Service: General;  Laterality: Left;  . Portacath placement Right 04/07/2013  . Portacath placement Right 04/07/2013    Procedure: INSERTION PORT-A-CATH;  Surgeon: Scherry Ran, MD;  Location: [facility];  Service: General;  Laterality: Right;  . Esophagogastroduodenoscopy  03/10/2002    YHC:WCBJSE esophagus/small HH/Tiny AVM in antrum, otherwise normal stomach and D1 and D2/Status post passage of the [facility] dilator  . Colonoscopy  03/10/2002    RMR: Incomplete colonoscopy  (sigmoidoscopy)/Normal rectum/Normal colon to 40 cm.     There were no vitals taken for this visit.  Visit Diagnosis:  Other fatigue  Leg weakness, bilateral      Subjective Assessment - 06/14/14 1451    Symptoms Pt states that her stomach has been upset today, and that she is just not feeling 100% today, more tired than usual                    [facility] Adult PT Treatment/Exercise - 06/14/14 0001    Knee/Hip Exercises: Stretches   Active Hamstring Stretch 3 reps;30 seconds   Active Hamstring Stretch Limitations 8 inch box   Gastroc Stretch 3 reps;30 seconds   Gastroc Stretch Limitations slantboard, changed to 4 inch stairs   Knee/Hip Exercises: Standing   Heel Raises 5 reps   Heel Raises Limitations sit to stand up on toes with pushout with green ball   Forward Lunges 1 set;10 reps   Forward Lunges Limitations 4 inch step   Side Lunges 1 set;10 reps   Side Lunges Limitations 4 inch step, 5x each side   Forward Step Up 10 reps   Forward Step Up Limitations 4 inch step, cues for proper performance   Gait Training Gait x3 laps; 3 laps; 2 laps   Other Standing Knee Exercises Toe raises; mini-squats 1x10, heel raises 1x10                  PT Short Term Goals - 05/03/14 1702  PT SHORT TERM GOAL #1   Title Pt to be walking 15 minutes at least 4 times a week    Time 4   Period Weeks   PT SHORT TERM GOAL #2   Title Pt to state that she is able to stand for 10 minutes to make a simple meal    Time 4   Period Weeks   PT SHORT TERM GOAL #3   Title strength to be improved by 1/2 grade    Time 4   Period Weeks           PT Long Term Goals - 05/03/14 1704    PT LONG TERM GOAL #1   Title Pt to be walking 25 minutes a day for at least 4 days a week for better health habits   Time 8   Period Weeks   PT LONG TERM GOAL #2   Title Pt SLS to be at least 10 seconds to decrease risk of falling    Time 8   Period Weeks   PT LONG TERM GOAL #3   Title Pt to  be able to stand for 20 minutes to make a meal    Time 8   Period Weeks               Plan - 06/14/14 1524    Clinical Impression Statement Patient participated well in and tolerated session including functional exercises and gait training with focus on functional activity tolerance well on this date. Patient did become quickly fatigued with extended functional activity, and was able to ambulate a max of 3 laps around facility before fatiguing. Cues for good form and activity pacing this session; continued encouragement to progress walking at home. Frequent rest breaks provided throughout session due to fatigue.   Pt will benefit from skilled therapeutic intervention in order to improve on the following deficits Decreased activity tolerance;Decreased balance;Decreased endurance;Decreased strength   Rehab Potential Good   PT Frequency 1x / week   PT Duration 8 weeks   PT Treatment/Interventions Patient/family education;Balance training;Therapeutic exercise;Therapeutic activities;Functional mobility training   PT Next Visit Plan continue with weekly instruction with written handouts; continue functional activity tolerance tasks as well as functional strengthening to tolerance; lateral step ups; vector stances and Warrior I and II        Problem List Patient Active Problem List   Diagnosis Date Noted  . Colon cancer screening 05/11/2014  . GERD (gastroesophageal reflux disease) 08/11/2013  . Bloating 08/11/2013  . Encounter for therapeutic drug monitoring 06/17/2013  . Paroxysmal atrial fibrillation 03/17/2013  . Vitamin D deficiency 03/16/2013  . Breast cancer of upper-inner quadrant of left female breast 03/16/2013  . Atrial fibrillation   . Hypertension   . Chronic anticoagulation 11/11/2010  . Hyperlipidemia 02/26/2010    Deniece Ree PT, DPT [phone]  [facility] [address] [place], [place], [address] Phone:  [phone]   Fax:  [phone]

## 2014-06-28 ENCOUNTER — Other Ambulatory Visit: Payer: Self-pay | Admitting: Adult Health

## 2014-06-28 ENCOUNTER — Ambulatory Visit (HOSPITAL_COMMUNITY): Payer: Medicare Other | Admitting: Physical Therapy

## 2014-07-05 ENCOUNTER — Encounter (HOSPITAL_COMMUNITY): Payer: Medicare Other | Attending: Hematology and Oncology | Admitting: Oncology

## 2014-07-05 ENCOUNTER — Encounter (HOSPITAL_COMMUNITY): Payer: Medicare Other

## 2014-07-05 ENCOUNTER — Other Ambulatory Visit (HOSPITAL_COMMUNITY): Payer: Medicare Other

## 2014-07-05 ENCOUNTER — Encounter (HOSPITAL_COMMUNITY): Payer: Self-pay | Admitting: Oncology

## 2014-07-05 VITALS — BP 111/72 | HR 54 | Temp 98.2°F | Resp 20 | Wt 143.8 lb

## 2014-07-05 DIAGNOSIS — C50212 Malignant neoplasm of upper-inner quadrant of left female breast: Secondary | ICD-10-CM | POA: Insufficient documentation

## 2014-07-05 DIAGNOSIS — Z95828 Presence of other vascular implants and grafts: Secondary | ICD-10-CM

## 2014-07-05 DIAGNOSIS — E559 Vitamin D deficiency, unspecified: Secondary | ICD-10-CM

## 2014-07-05 LAB — CBC WITH DIFFERENTIAL/PLATELET
BASOS ABS: 0 10*3/uL (ref 0.0–0.1)
BASOS PCT: 1 % (ref 0–1)
EOS PCT: 1 % (ref 0–5)
Eosinophils Absolute: 0.1 10*3/uL (ref 0.0–0.7)
HEMATOCRIT: 40.2 % (ref 36.0–46.0)
Hemoglobin: 12.9 g/dL (ref 12.0–15.0)
LYMPHS ABS: 3.2 10*3/uL (ref 0.7–4.0)
LYMPHS PCT: 39 % (ref 12–46)
MCH: 30.1 pg (ref 26.0–34.0)
MCHC: 32.1 g/dL (ref 30.0–36.0)
MCV: 93.7 fL (ref 78.0–100.0)
MONO ABS: 0.8 10*3/uL (ref 0.1–1.0)
Monocytes Relative: 10 % (ref 3–12)
Neutro Abs: 4.3 10*3/uL (ref 1.7–7.7)
Neutrophils Relative %: 51 % (ref 43–77)
PLATELETS: 253 10*3/uL (ref 150–400)
RBC: 4.29 MIL/uL (ref 3.87–5.11)
RDW: 14 % (ref 11.5–15.5)
WBC: 8.4 10*3/uL (ref 4.0–10.5)

## 2014-07-05 LAB — COMPREHENSIVE METABOLIC PANEL
ALT: 13 U/L (ref 0–35)
AST: 25 U/L (ref 0–37)
Albumin: 4.2 g/dL (ref 3.5–5.2)
Alkaline Phosphatase: 47 U/L (ref 39–117)
Anion gap: 8 (ref 5–15)
BILIRUBIN TOTAL: 0.7 mg/dL (ref 0.3–1.2)
BUN: 15 mg/dL (ref 6–23)
CO2: 25 mmol/L (ref 19–32)
Calcium: 9.2 mg/dL (ref 8.4–10.5)
Chloride: 104 mmol/L (ref 96–112)
Creatinine, Ser: 0.99 mg/dL (ref 0.50–1.10)
GFR, EST AFRICAN AMERICAN: 63 mL/min — AB (ref >90)
GFR, EST NON AFRICAN AMERICAN: 55 mL/min — AB (ref >90)
GLUCOSE: 84 mg/dL (ref 70–99)
POTASSIUM: 4.5 mmol/L (ref 3.5–5.1)
Sodium: 137 mmol/L (ref 135–145)
Total Protein: 7 g/dL (ref 6.0–8.3)

## 2014-07-05 MED ORDER — SODIUM CHLORIDE 0.9 % IJ SOLN
10.0000 mL | Freq: Once | INTRAMUSCULAR | Status: AC
  Administered 2014-07-05: 10 mL via INTRAVENOUS

## 2014-07-05 MED ORDER — ANASTROZOLE 1 MG PO TABS: 1.0000 mg | ORAL_TABLET | Freq: Every day | ORAL | Status: DC

## 2014-07-05 MED ORDER — HEPARIN SOD (PORK) LOCK FLUSH 100 UNIT/ML IV SOLN
INTRAVENOUS | Status: AC
  Filled 2014-07-05: qty 5

## 2014-07-05 MED ORDER — HEPARIN SOD (PORK) LOCK FLUSH 100 UNIT/ML IV SOLN
500.0000 [IU] | Freq: Once | INTRAVENOUS | Status: AC
  Administered 2014-07-05: 500 [IU] via INTRAVENOUS

## 2014-07-05 MED ORDER — METOCLOPRAMIDE HCL 10 MG PO TABS: 10.0000 mg | ORAL_TABLET | Freq: Three times a day (TID) | ORAL | Status: DC

## 2014-07-05 NOTE — Assessment & Plan Note (Addendum)
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 Now, S/P 12 months worth of Herceptin therapy every 3 weeks. Currently on Anastrozole started on 06/28/2013.   Bone density exam in April 2015 was normal.  Cardiac MUGA at the conclusion of Herceptin therapy demonstrates a LVEF of 53% which is stable.  Labs today: CBC diff, CMET, Vit D level.  Compliance with Anastrozole encouraged. Next mammogram is due in October 2016.  Labs in 4 months: CBC diff, CMET.  Return in 4 months for follow-up.

## 2014-07-05 NOTE — Progress Notes (Signed)
Stacie Huang presented for Portacath access and flush. Proper placement of portacath confirmed by CXR. Portacath located right chest wall accessed with  H 20 needle. Good blood return present. Portacath flushed with 20ml NS and 500U/5ml Heparin and needle removed intact. Procedure without incident. Patient tolerated procedure well.   

## 2014-07-05 NOTE — Patient Instructions (Signed)
[place] at [facility]  Discharge Instructions:  You saw T kefalas today.  Follow up in 4 months with the doctor and lab work.  Port flushes as scheduled.  Please call the clinic if you have any questions or concerns _______________________________________________________________  Thank you for choosing [place] at [facility] to provide your oncology and hematology care.  To afford each patient quality time with our providers, please arrive at least 15 minutes before your scheduled appointment.  You need to re-schedule your appointment if you arrive 10 or more minutes late.  We strive to give you quality time with our providers, and arriving late affects you and other patients whose appointments are after yours.  Also, if you no show three or more times for appointments you may be dismissed from the clinic.  Again, thank you for choosing [place] [facility] [place] hope is that these requests will allow you access to exceptional care and in a timely manner. _______________________________________________________________  If you have questions after your visit, please contact our office at [phone] between the hours of 8:30 a.m. and 5:00 p.m. Voicemails left after 4:30 p.m. will not be returned until the following business day. _______________________________________________________________  For prescription refill requests, have your pharmacy contact our office. _______________________________________________________________  Recommendations made by the consultant and any test results will be sent to your referring physician. _______________________________________________________________

## 2014-07-05 NOTE — Progress Notes (Signed)
[name], MD [place] [address]  Breast cancer of upper-inner quadrant of left female breast - Plan: anastrozole (ARIMIDEX) 1 MG tablet, metoCLOPramide (REGLAN) 10 MG tablet, CBC with Differential, Comprehensive metabolic panel  CURRENT THERAPY: Anastrozole 1 mg daily beginning on 06/28/2013  INTERVAL HISTORY: Stacie Huang 74 y.o. 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 Now, S/P 12 months worth of Herceptin therapy every 3 weeks. Currently on Anastrozole started on 06/28/2013.     Breast cancer of upper-inner quadrant of left female breast   02/04/2013 Initial Diagnosis Breast cancer of upper-inner quadrant of left female breast   02/23/2013 Surgery left lumpectomy (UIQ), sentinel node biopsy--0.7cm, Node negative, ER+, PR+, HER-2/neu over expressed.   04/08/2013 - 04/14/2013 Chemotherapy Paclitaxel/Herceptin weekly x 12. Intolerance to Paclitaxel and only receiving 2 cycles   05/04/2013 - 04/05/2014 Chemotherapy Herceptin every 21 days    06/28/2013 -  Chemotherapy Anastrazole started   08/02/2013 Imaging Bone density- normal   04/06/2014 Imaging MUGA- Left ventricular ejection fraction equals 53%.   I personally reviewed and went over laboratory results with the patient.  The results are noted within this dictation.  She continues to tolerate endocrine therapy well.  She notes mild hot flashes and reports that they do not interfere with QOL.  She denies any arthralgias or myalgias that are new or worse since starting Anastrozole.    She requests a refill on Reglan which Dr. Barnet Glasgow was giving for gastroparesis and nausea.  I do not have that documented, but she has an upcoming appointment with GI. Nausea is not chemotherapy induced at this time.    Oncologically, she denies any complaints  and ROS questioning is negative.  Past Medical History  Diagnosis Date  . Hypertension   . Paroxysmal atrial fibrillation   . Chronic anticoagulation   . Hyperlipidemia   . GERD (gastroesophageal reflux disease)   . Cancer   . Breast cancer     has Hyperlipidemia; Chronic anticoagulation; Atrial fibrillation; Hypertension; Vitamin D deficiency; Breast cancer of upper-inner quadrant of left female breast; Paroxysmal atrial fibrillation; Encounter for therapeutic drug monitoring; GERD (gastroesophageal reflux disease); Bloating; and Colon cancer screening on her problem list.     is allergic to iohexol and sulfa antibiotics.  Ms. Mareno had no medications administered during this visit.  Past Surgical History  Procedure Laterality Date  . Abdominal hysterectomy    . Cholecystectomy    . Partial mastectomy with axillary sentinel lymph node biopsy Left 02/23/2013    Procedure: LEFT PARTIAL MASTECTOMY WITH AXILLARY SIMPLE NODE BIOPSY, LEFT BREAST WIDE EXCISION;  Surgeon: Scherry Ran, MD;  Location: [facility];  Service: General;  Laterality: Left;  . Axillary lymph node dissection Left 02/23/2013    Procedure: LEFT AXILLARY LYMPH NODE DISSECTION;  Surgeon: Scherry Ran, MD;  Location: [facility];  Service: General;  Laterality: Left;  . Portacath placement Right 04/07/2013  . Portacath placement Right 04/07/2013    Procedure: INSERTION PORT-A-CATH;  Surgeon: Scherry Ran, MD;  Location: [facility];  Service: General;  Laterality: Right;  . Esophagogastroduodenoscopy  03/10/2002    BTD:HRCBUL esophagus/small HH/Tiny AVM in antrum, otherwise normal stomach and D1 and D2/Status post passage of the [facility] dilator  . Colonoscopy  03/10/2002    RMR: Incomplete colonoscopy (sigmoidoscopy)/Normal rectum/Normal colon  to 40 cm.     Denies any headaches, dizziness, double vision, fevers, chills, night sweats, nausea, vomiting, diarrhea, constipation, chest pain, heart palpitations,  shortness of breath, blood in stool, black tarry stool, urinary pain, urinary burning, urinary frequency, hematuria.   PHYSICAL EXAMINATION  ECOG PERFORMANCE STATUS: 0 - Asymptomatic  Filed Vitals:   07/05/14 1238  BP: 111/72  Pulse:   Temp:   Resp:     GENERAL:alert, no distress, well nourished, well developed, comfortable, cooperative and smiling SKIN: skin color, texture, turgor are normal, no rashes or significant lesions HEAD: Normocephalic, No masses, lesions, tenderness or abnormalities EYES: normal, PERRLA, EOMI, Conjunctiva are pink and non-injected EARS: External ears normal OROPHARYNX:mucous membranes are moist  NECK: supple, no adenopathy, thyroid normal size, non-tender, without nodularity, no stridor, non-tender, trachea midline LYMPH:  no palpable lymphadenopathy, no hepatosplenomegaly BREAST:right breast normal without mass, skin or nipple changes or axillary nodes, left post-lumpectomy site well healed and free of suspicious changes LUNGS: clear to auscultation and percussion HEART: regular rate & rhythm, no murmurs, no gallops, S1 normal and S2 normal ABDOMEN:abdomen soft, non-tender, normal bowel sounds and no masses or organomegaly BACK: Back symmetric, no curvature., No CVA tenderness EXTREMITIES:less then 2 second capillary refill, no joint deformities, effusion, or inflammation, no edema, no skin discoloration, no clubbing, no cyanosis  NEURO: alert & oriented x 3 with fluent speech, no focal motor/sensory deficits, gait normal   LABORATORY DATA: CBC    Component Value Date/Time   WBC 8.4 07/05/2014 1219   RBC 4.29 07/05/2014 1219   HGB 12.9 07/05/2014 1219   HCT 40.2 07/05/2014 1219   PLT 253 07/05/2014 1219   MCV 93.7 07/05/2014 1219   MCH 30.1 07/05/2014 1219   MCHC 32.1 07/05/2014 1219   RDW 14.0 07/05/2014 1219   LYMPHSABS 3.2 07/05/2014 1219   MONOABS 0.8 07/05/2014 1219   EOSABS 0.1 07/05/2014 1219   BASOSABS 0.0 07/05/2014 1219       Chemistry      Component Value Date/Time   NA 138 04/05/2014 1000   K 4.3 04/05/2014 1000   CL 102 04/05/2014 1000   CO2 24 04/05/2014 1000   BUN 15 04/05/2014 1000   CREATININE 0.91 04/05/2014 1000      Component Value Date/Time   CALCIUM 9.1 04/05/2014 1000   ALKPHOS 52 04/05/2014 1000   AST 17 04/05/2014 1000   ALT 13 04/05/2014 1000   BILITOT 0.5 04/05/2014 1000       ASSESSMENT AND PLAN:  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 Now, S/P 12 months worth of Herceptin therapy every 3 weeks. Currently on Anastrozole started on 06/28/2013.   Bone density exam in April 2015 was normal.  Cardiac MUGA at the conclusion of Herceptin therapy demonstrates a LVEF of 53% which is stable.  Labs today: CBC diff, CMET, Vit D level.  Compliance with Anastrozole encouraged. Next mammogram is due in October 2016.  Labs in 4 months: CBC diff, CMET.  Return in 4 months for follow-up.     THERAPY PLAN:  NCCN guidelines recommends the following surveillance for invasive breast cancer:  A. History and Physical exam every 4-6 months for 5 years and then every 12 months.  B. Mammography every 12 months  C. Women on Tamoxifen: annual gynecologic assessment every 12 months if uterus  is present.  D. Women on aromatase inhibitor or who experience ovarian failure secondary to treatment should have monitoring of bone health with a bone mineral density determination at baseline and periodically thereafter.  E. Assess and encourage adherence to adjuvant endocrine therapy.  F. Evidence suggests that active lifestyle and achieving and maintaining an ideal body weight (20-25 BMI) may lead to optimal breast cancer outcomes.     All questions were answered. The patient knows to call the clinic with any problems, questions  or concerns. We can certainly see the patient much sooner if necessary.  Patient and plan discussed with Dr. Shannon Penland and she is in agreement with the aforementioned.   This note is electronically signed by: KEFALAS,THOMAS 07/05/2014 1:31 PM   

## 2014-07-06 ENCOUNTER — Telehealth (HOSPITAL_COMMUNITY): Payer: Self-pay | Admitting: Emergency Medicine

## 2014-07-06 LAB — VITAMIN D 25 HYDROXY (VIT D DEFICIENCY, FRACTURES): VIT D 25 HYDROXY: 34.1 ng/mL (ref 30.0–100.0)

## 2014-07-06 NOTE — Telephone Encounter (Signed)
Called and left pt a message that the labs that were back were good.  If she had any questions to call us back

## 2014-07-06 NOTE — Telephone Encounter (Signed)
-----   Message from Baird Cancer, PA-C sent at 07/05/2014  1:39 PM EST ----- Excellent.  Let patient know.  Some labs are pending.

## 2014-07-10 ENCOUNTER — Ambulatory Visit (INDEPENDENT_AMBULATORY_CARE_PROVIDER_SITE_OTHER): Payer: Medicare Other | Admitting: *Deleted

## 2014-07-10 DIAGNOSIS — Z5181 Encounter for therapeutic drug level monitoring: Secondary | ICD-10-CM | POA: Diagnosis not present

## 2014-07-10 DIAGNOSIS — I480 Paroxysmal atrial fibrillation: Secondary | ICD-10-CM

## 2014-07-10 DIAGNOSIS — Z7901 Long term (current) use of anticoagulants: Secondary | ICD-10-CM

## 2014-07-10 LAB — POCT INR: INR: 2.7

## 2014-07-10 MED ORDER — DILTIAZEM HCL ER COATED BEADS 120 MG PO CP24: ORAL_CAPSULE | ORAL | Status: DC

## 2014-07-10 MED ORDER — WARFARIN SODIUM 2.5 MG PO TABS: ORAL_TABLET | ORAL | Status: DC

## 2014-07-12 ENCOUNTER — Ambulatory Visit (HOSPITAL_COMMUNITY): Payer: Medicare Other | Attending: Oncology | Admitting: Physical Therapy

## 2014-07-12 DIAGNOSIS — R29898 Other symptoms and signs involving the musculoskeletal system: Secondary | ICD-10-CM

## 2014-07-12 DIAGNOSIS — R5383 Other fatigue: Secondary | ICD-10-CM | POA: Diagnosis not present

## 2014-07-12 NOTE — Therapy (Signed)
Stacie Huang, [place], [address] Phone: [phone]   Fax:  [phone]  Physical Therapy Re-evaluation   Name: Stacie Huang MRN: [id number] Date of Birth: October 09, 1940 Referring Provider:  Baird Cancer, PA-C  Encounter Date: 07/12/2014      PT End of Session - 07/12/14 1219    Visit Number 4   Number of Visits 8   Date for PT Re-Evaluation 08/09/14   Authorization Type medicare   Authorization - Visit Number 4   Authorization - Number of Visits 8   PT Start Time 1110   PT Stop Time 1155   PT Time Calculation (min) 45 min   Activity Tolerance Patient tolerated treatment well;Patient limited by fatigue   Behavior During Therapy [facility] for tasks assessed/performed      Past Medical History  Diagnosis Date  . Hypertension   . Paroxysmal atrial fibrillation   . Chronic anticoagulation   . Hyperlipidemia   . GERD (gastroesophageal reflux disease)   . Cancer   . Breast cancer     Past Surgical History  Procedure Laterality Date  . Abdominal hysterectomy    . Cholecystectomy    . Partial mastectomy with axillary sentinel lymph node biopsy Left 02/23/2013    Procedure: LEFT PARTIAL MASTECTOMY WITH AXILLARY SIMPLE NODE BIOPSY, LEFT BREAST WIDE EXCISION;  Surgeon: Scherry Ran, MD;  Location: [facility];  Service: General;  Laterality: Left;  . Axillary lymph node dissection Left 02/23/2013    Procedure: LEFT AXILLARY LYMPH NODE DISSECTION;  Surgeon: Scherry Ran, MD;  Location: [facility];  Service: General;  Laterality: Left;  . Portacath placement Right 04/07/2013  . Portacath placement Right 04/07/2013    Procedure: INSERTION PORT-A-CATH;  Surgeon: Scherry Ran, MD;  Location: [facility];  Service: General;  Laterality: Right;  . Esophagogastroduodenoscopy  03/10/2002    BBC:WUGQBV esophagus/small HH/Tiny AVM in antrum, otherwise normal stomach and D1 and D2/Status post passage of the [facility] dilator   . Colonoscopy  03/10/2002    RMR: Incomplete colonoscopy (sigmoidoscopy)/Normal rectum/Normal colon to 40 cm.     There were no vitals filed for this visit.  Visit Diagnosis:  Other fatigue  Leg weakness, bilateral      Subjective Assessment - 07/12/14 1310    Symptoms PT states she has not been able to make her appointments due to transportation and weather.  sTates she is ready to return and work.  Currently without pain but with some fatigue.    Currently in Pain? No/denies            [facility] PT Assessment - 07/12/14 1119    Assessment   Medical Diagnosis weakness    Onset Date 01/31/14   Next MD Visit 07/2014   Precautions   Precautions Other (comment)   Precaution Comments neutropenic   Observation/Other Assessments   Focus on Therapeutic Outcomes (FOTO)  Facit-F 98 (was 63.8)   Other Surveys  --  VAS 5.6 (was 8.33), Distress 5.6 (was 6.6), pain 5.6 (was 6)   Strength   Right Hip Flexion 5/5  was 5/5 1/6   Right Hip Extension 4/5  was 4/5   Right Hip ABduction 4/5  was 4/5   Left Hip Flexion 4/5  was 4/5   Left Hip Extension 4/5  was 4/5   Left Hip ABduction 4/5  was 4/5   Right Knee Flexion 5/5  was 5/5   Right Knee Extension 5/5  was 5/5  Left Knee Flexion 4/5  was 5/5   Left Knee Extension 5/5  was 5/5   Right Ankle Dorsiflexion 5/5  was 4/5   Left Ankle Dorsiflexion 5/5  was 5/5   Timed Up and Go Test   Normal TUG (seconds) 7.73  was 11.4              PT Short Term Goals - 07/12/14 1219    PT SHORT TERM GOAL #1   Title Pt to be walking 15 minutes at least 4 times a week    Time 4   Period Weeks   Status On-going   PT SHORT TERM GOAL #2   Title Pt to state that she is able to stand for 10 minutes to make a simple meal    Time 4   Period Weeks   Status Achieved   PT SHORT TERM GOAL #3   Title strength to be improved by 1/2 grade    Time 4   Period Weeks   Status On-going           PT Long Term Goals - 07/12/14 1220     PT LONG TERM GOAL #1   Title Pt to be walking 25 minutes a day for at least 4 days a week for better health habits   Time 8   Period Weeks   Status On-going   PT LONG TERM GOAL #2   Title Pt SLS to be at least 10 seconds to decrease risk of falling    Time 8   Period Weeks   Status On-going   PT LONG TERM GOAL #3   Title Pt to be able to stand for 20 minutes to make a meal    Time 8   Period Weeks   Status On-going               Plan - 07/12/14 1221    Clinical Impression Statement Pt has been a patient of physical therapy since 04/2014.  PT has only completed 4 sessions since then due to weather and transportation difficulties.  Pt has returned wanting to resume her therapy at this time in order to increase her functional actvitiy tolerance.  Pt finished her chemo treatments in Dec 2015, however continues to follow up with oncologist and continues to have a weakened immune system and general lack of endurance.  .Pt has only met 1 STG but is progressing towards meeting additional goals.  pt would benefit from continued therapy to achieve remaining goals and improve actvity tolerance.     Pt will benefit from skilled therapeutic intervention in order to improve on the following deficits Decreased activity tolerance;Decreased balance;Decreased endurance;Decreased strength   Rehab Potential Good   PT Frequency 1x / week   PT Duration 8 weeks   PT Treatment/Interventions Patient/family education;Balance training;Therapeutic exercise;Therapeutic activities;Functional mobility training   PT Next Visit Plan continue with weekly instruction with written handouts; continue functional activity tolerance tasks as well as functional strengthening to tolerance; lateral step ups; vector stances and Warrior I and II        Problem List Patient Active Problem List   Diagnosis Date Noted  . Colon cancer screening 05/11/2014  . GERD (gastroesophageal reflux disease) 08/11/2013  . Bloating  08/11/2013  . Encounter for therapeutic drug monitoring 06/17/2013  . Paroxysmal atrial fibrillation 03/17/2013  . Vitamin D deficiency 03/16/2013  . Breast cancer of upper-inner quadrant of left female breast 03/16/2013  . Atrial fibrillation   . Hypertension   .  Chronic anticoagulation 11/11/2010  . Hyperlipidemia 02/26/2010    Teena Irani, PTA/CLT/ Azucena Freed PT [phone] 07/12/2014, 1:11 PM  [facility] [address] [place], [place], [address] Phone: [phone]   Fax:  [phone]

## 2014-07-25 ENCOUNTER — Ambulatory Visit (HOSPITAL_COMMUNITY): Payer: Medicare Other | Admitting: Physical Therapy

## 2014-07-25 DIAGNOSIS — R5383 Other fatigue: Secondary | ICD-10-CM | POA: Diagnosis not present

## 2014-07-25 DIAGNOSIS — R29898 Other symptoms and signs involving the musculoskeletal system: Secondary | ICD-10-CM

## 2014-07-25 NOTE — Therapy (Signed)
[facility] [place], [place], [address] Phone: [phone]   Fax:  [phone]  Physical Therapy Treatment  Patient Details  Name: Stacie Huang MRN: [id number] Date of Birth: 1940/05/04 Referring Provider:  Baird Cancer, PA-C  Encounter Date: 07/25/2014      PT End of Session - 07/25/14 1424    Visit Number 5   Number of Visits 8   Date for PT Re-Evaluation 08/09/14   Authorization Type medicare   Authorization - Visit Number 5   Authorization - Number of Visits 8   PT Start Time 1350   PT Stop Time 1438   PT Time Calculation (min) 48 min      Past Medical History  Diagnosis Date  . Hypertension   . Paroxysmal atrial fibrillation   . Chronic anticoagulation   . Hyperlipidemia   . GERD (gastroesophageal reflux disease)   . Cancer   . Breast cancer     Past Surgical History  Procedure Laterality Date  . Abdominal hysterectomy    . Cholecystectomy    . Partial mastectomy with axillary sentinel lymph node biopsy Left 02/23/2013    Procedure: LEFT PARTIAL MASTECTOMY WITH AXILLARY SIMPLE NODE BIOPSY, LEFT BREAST WIDE EXCISION;  Surgeon: Scherry Ran, MD;  Location: [facility];  Service: General;  Laterality: Left;  . Axillary lymph node dissection Left 02/23/2013    Procedure: LEFT AXILLARY LYMPH NODE DISSECTION;  Surgeon: Scherry Ran, MD;  Location: [facility];  Service: General;  Laterality: Left;  . Portacath placement Right 04/07/2013  . Portacath placement Right 04/07/2013    Procedure: INSERTION PORT-A-CATH;  Surgeon: Scherry Ran, MD;  Location: [facility];  Service: General;  Laterality: Right;  . Esophagogastroduodenoscopy  03/10/2002    UKG:URKYHC esophagus/small HH/Tiny AVM in antrum, otherwise normal stomach and D1 and D2/Status post passage of the [facility] dilator  . Colonoscopy  03/10/2002    RMR: Incomplete colonoscopy (sigmoidoscopy)/Normal rectum/Normal colon to 40 cm.     There were  no vitals filed for this visit.  Visit Diagnosis:  Other fatigue  Leg weakness, bilateral      Subjective Assessment - 07/25/14 1351    Symptoms Pt states she is not feeling well today requests therapist to go easy on  her.    Currently in Pain? No/denies   Pain Score 0-No pain              [facility] Adult PT Treatment/Exercise - 07/25/14 0001    Balance Poses: Yoga   Warrior I 2 reps;30 seconds   Knee/Hip Exercises: Clinical research associate 3 reps;30 seconds   Gastroc Stretch Limitations slantboard, changed to 4 inch stairs   Knee/Hip Exercises: Standing   Heel Raises 10 reps   Heel Raises Limitations with functional squat    Forward Lunges Both;10 reps   Forward Lunges Limitations --  4 " step    Side Lunges Both;10 reps   Side Lunges Limitations 4" step    Lateral Step Up Both;10 reps   Forward Step Up Both;10 reps   SLS with Vectors 2 reps of 10 seconds    Other Standing Knee Exercises t band postural exercises x 10 each             PT Education - 07/25/14 1424    Education provided Yes   Education Details Warrior I and vector Writer) Educated Patient   Methods Explanation   Comprehension Verbalized understanding;Returned  demonstration          PT Short Term Goals - 07/12/14 1219    PT SHORT TERM GOAL #1   Title Pt to be walking 15 minutes at least 4 times a week    Time 4   Period Weeks   Status On-going   PT SHORT TERM GOAL #2   Title Pt to state that she is able to stand for 10 minutes to make a simple meal    Time 4   Period Weeks   Status Achieved   PT SHORT TERM GOAL #3   Title strength to be improved by 1/2 grade    Time 4   Period Weeks   Status On-going           PT Long Term Goals - 07/12/14 1220    PT LONG TERM GOAL #1   Title Pt to be walking 25 minutes a day for at least 4 days a week for better health habits   Time 8   Period Weeks   Status On-going   PT LONG TERM GOAL #2   Title Pt SLS to be at least 10  seconds to decrease risk of falling    Time 8   Period Weeks   Status On-going   PT LONG TERM GOAL #3   Title Pt to be able to stand for 20 minutes to make a meal    Time 8   Period Weeks   Status On-going               Plan - 07/25/14 1425    Clinical Impression Statement Pt not feeling well but did very well with all activities with only three short rest breaks needed.  Pt exhibits fair plus balance with vector stances but needs encouragement that she is able to complete without hand hold.     Rehab Potential Good   PT Next Visit Plan begin Warrior II next treatment as well as foam cone rotation.  Encourage pt to walk every day.  Give handout for functional squat, lateral and forward step up as well as vector stance.         Problem List Patient Active Problem List   Diagnosis Date Noted  . Colon cancer screening 05/11/2014  . GERD (gastroesophageal reflux disease) 08/11/2013  . Bloating 08/11/2013  . Encounter for therapeutic drug monitoring 06/17/2013  . Paroxysmal atrial fibrillation 03/17/2013  . Vitamin D deficiency 03/16/2013  . Breast cancer of upper-inner quadrant of left female breast 03/16/2013  . Atrial fibrillation   . Hypertension   . Chronic anticoagulation 11/11/2010  . Hyperlipidemia 02/26/2010   Rayetta Humphrey, PT CLT [phone] 07/25/2014, 2:29 PM  [facility] [address] [place], [place], [address] Phone: [phone]   Fax:  [phone]

## 2014-08-01 ENCOUNTER — Ambulatory Visit (HOSPITAL_COMMUNITY): Payer: Medicare Other | Admitting: Physical Therapy

## 2014-08-08 ENCOUNTER — Ambulatory Visit (HOSPITAL_COMMUNITY): Payer: Medicare Other | Attending: Oncology | Admitting: Physical Therapy

## 2014-08-08 DIAGNOSIS — R5383 Other fatigue: Secondary | ICD-10-CM | POA: Insufficient documentation

## 2014-08-08 DIAGNOSIS — R29898 Other symptoms and signs involving the musculoskeletal system: Secondary | ICD-10-CM | POA: Diagnosis not present

## 2014-08-08 DIAGNOSIS — R530 Neoplastic (malignant) related fatigue: Secondary | ICD-10-CM

## 2014-08-08 NOTE — Therapy (Signed)
[place], [place], [address] Phone: [phone]   Fax:  [phone]  Physical Therapy Treatment  Patient Details  Name: Stacie Huang MRN: [id number] Date of Birth: 10-22-1940 Referring Provider:  Baird Cancer, PA-C  Encounter Date: 08/08/2014      PT End of Session - 08/08/14 1431    Visit Number 6   Number of Visits 8   Date for PT Re-Evaluation 08/09/14   Authorization Type medicare   Authorization - Visit Number 6   Authorization - Number of Visits 8   PT Start Time 1110   PT Stop Time 1200   PT Time Calculation (min) 50 min   Activity Tolerance Patient tolerated treatment well      Past Medical History  Diagnosis Date  . Hypertension   . Paroxysmal atrial fibrillation   . Chronic anticoagulation   . Hyperlipidemia   . GERD (gastroesophageal reflux disease)   . Cancer   . Breast cancer     Past Surgical History  Procedure Laterality Date  . Abdominal hysterectomy    . Cholecystectomy    . Partial mastectomy with axillary sentinel lymph node biopsy Left 02/23/2013    Procedure: LEFT PARTIAL MASTECTOMY WITH AXILLARY SIMPLE NODE BIOPSY, LEFT BREAST WIDE EXCISION;  Surgeon: Scherry Ran, MD;  Location: [facility];  Service: General;  Laterality: Left;  . Axillary lymph node dissection Left 02/23/2013    Procedure: LEFT AXILLARY LYMPH NODE DISSECTION;  Surgeon: Scherry Ran, MD;  Location: [facility];  Service: General;  Laterality: Left;  . Portacath placement Right 04/07/2013  . Portacath placement Right 04/07/2013    Procedure: INSERTION PORT-A-CATH;  Surgeon: Scherry Ran, MD;  Location: [facility];  Service: General;  Laterality: Right;  . Esophagogastroduodenoscopy  03/10/2002    FIE:PPIRJJ esophagus/small HH/Tiny AVM in antrum, otherwise normal stomach and D1 and D2/Status post passage of the [facility] dilator  . Colonoscopy  03/10/2002    RMR: Incomplete colonoscopy  (sigmoidoscopy)/Normal rectum/Normal colon to 40 cm.     There were no vitals filed for this visit.  Visit Diagnosis:  Neoplastic malignant related fatigue - Plan: PT plan of care cert/re-cert  Other fatigue  Leg weakness, bilateral      Subjective Assessment - 08/08/14 1116    Subjective I just feel weak today; no energy    Currently in Pain? 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                    PT Short Term Goals - 07/12/14 1219    PT SHORT TERM GOAL #1   Title Pt to be walking 15 minutes at least 4 times a week  Time 4   Period Weeks   Status On-going   PT SHORT TERM GOAL #2   Title Pt to state that she is able to stand for 10 minutes to make a simple meal    Time 4   Period Weeks   Status Achieved   PT SHORT TERM GOAL #3   Title strength to be improved by 1/2 grade    Time 4   Period Weeks   Status On-going           PT Long Term Goals - 07/12/14 1220    PT LONG TERM GOAL #1   Title Pt to be walking 25 minutes a day for at least 4 days a week for better health habits   Time 8   Period Weeks   Status On-going   PT LONG TERM GOAL #2   Title Pt SLS to be at least 10 seconds to decrease risk of falling    Time 8   Period Weeks   Status On-going   PT LONG TERM GOAL #3   Title Pt to be able to stand for 20 minutes to make a meal    Time 8    Period Weeks   Status On-going               Plan - 08/08/14 1432    Clinical Impression Statement Pt only needed two short rest breaks today.  Pt did well with cone rotation but will need continued cuing on warrior III pose.  Pt able to complete all activites without UE hold at this time although pt is very nervous while completing activities.     PT Next Visit Plan Give handout for functional squat, lateral and forward step up as well as vector stance.         Problem List Patient Active Problem List   Diagnosis Date Noted  . Colon cancer screening 05/11/2014  . GERD (gastroesophageal reflux disease) 08/11/2013  . Bloating 08/11/2013  . Encounter for therapeutic drug monitoring 06/17/2013  . Paroxysmal atrial fibrillation 03/17/2013  . Vitamin D deficiency 03/16/2013  . Breast cancer of upper-inner quadrant of left female breast 03/16/2013  . Atrial fibrillation   . Hypertension   . Chronic anticoagulation 11/11/2010  . Hyperlipidemia 02/26/2010  Rayetta Humphrey, PT CLT [id number] 08/08/2014, 2:36 PM  [facility] [address] [place], [place], [address] Phone: [phone]   Fax:  [phone]

## 2014-08-15 ENCOUNTER — Ambulatory Visit (HOSPITAL_COMMUNITY): Payer: Medicare Other | Admitting: Physical Therapy

## 2014-08-15 DIAGNOSIS — R530 Neoplastic (malignant) related fatigue: Secondary | ICD-10-CM

## 2014-08-15 DIAGNOSIS — R29898 Other symptoms and signs involving the musculoskeletal system: Secondary | ICD-10-CM

## 2014-08-15 DIAGNOSIS — R5383 Other fatigue: Secondary | ICD-10-CM

## 2014-08-15 NOTE — Patient Instructions (Signed)
Functional Quadriceps: Chair Squat   Keeping feet flat on floor, shoulder width apart, squat as low as is comfortable. Use support as necessary. Repeat _10___ times per set. Do __1__ sets per session. Do _1___ sessions per day.  http://orth.exer.us/736   Copyright  VHI. All rights reserved.  Step-Down / Step-Up   Stand on stair step or ___6_ inch stool. Slowly bend left leg, lowering other foot to floor. Return by straightening front leg. Repeat _10___ times per set. Do _1___ sets per session. Do ____1 sessions per day. Repeat to left http://orth.exer.us/684   Copyright  VHI. All rights reserved.  Hip Hike   Stand on step, right leg off step, knee straight. Raise unsupported hip, keeping knee straight. Repeat _10___ times per set. Do __1__ sets per session. Do ____1 sessions per day. Repeat to left. http://orth.exer.us/692   Copyright  VHI. All rights reserved.  Balance: Three-Way Leg Swing   Stand on left foot, hands on hips. Reach other foot forward ___10_ seconds, sideways __10__ seconds, back _10___ seconds . Relax. Repeat 3____ times per set. Do __1__ sets per session. Do __1__ sessions per day. Repeat to Right http://orth.exer.us/86   Copyright  VHI. All rights reserved.

## 2014-08-15 NOTE — Therapy (Signed)
[place], [place], [address] Phone: [phone]   Fax:  [phone]  Physical Therapy Treatment  Patient Details  Name: Stacie Huang MRN: [id number] Date of Birth: Mar 05, 1941 Referring Provider:  Baird Cancer, PA-C  Encounter Date: 08/15/2014      PT End of Session - 08/15/14 1339    Visit Number 7   Number of Visits 8   Date for PT Re-Evaluation 08/17/14   Authorization Type medicare   Authorization - Visit Number 7   Authorization - Number of Visits 8   PT Start Time 1829   PT Stop Time 1350   PT Time Calculation (min) 47 min      Past Medical History  Diagnosis Date  . Hypertension   . Paroxysmal atrial fibrillation   . Chronic anticoagulation   . Hyperlipidemia   . GERD (gastroesophageal reflux disease)   . Cancer   . Breast cancer     Past Surgical History  Procedure Laterality Date  . Abdominal hysterectomy    . Cholecystectomy    . Partial mastectomy with axillary sentinel lymph node biopsy Left 02/23/2013    Procedure: LEFT PARTIAL MASTECTOMY WITH AXILLARY SIMPLE NODE BIOPSY, LEFT BREAST WIDE EXCISION;  Surgeon: Scherry Ran, MD;  Location: [facility];  Service: General;  Laterality: Left;  . Axillary lymph node dissection Left 02/23/2013    Procedure: LEFT AXILLARY LYMPH NODE DISSECTION;  Surgeon: Scherry Ran, MD;  Location: [facility];  Service: General;  Laterality: Left;  . Portacath placement Right 04/07/2013  . Portacath placement Right 04/07/2013    Procedure: INSERTION PORT-A-CATH;  Surgeon: Scherry Ran, MD;  Location: [facility];  Service: General;  Laterality: Right;  . Esophagogastroduodenoscopy  03/10/2002    HBZ:JIRCVE esophagus/small HH/Tiny AVM in antrum, otherwise normal stomach and D1 and D2/Status post passage of the [facility] dilator  . Colonoscopy  03/10/2002    RMR: Incomplete colonoscopy (sigmoidoscopy)/Normal rectum/Normal colon to 40 cm.     There were  no vitals filed for this visit.  Visit Diagnosis:  Neoplastic malignant related fatigue  Other fatigue  Leg weakness, bilateral      Subjective Assessment - 08/15/14 1301    Subjective I'm tired I've been busy all day.   Currently in Pain? No/denies            [facility] Adult PT Treatment/Exercise - 08/15/14 0001    Balance Poses: Yoga   Warrior III 2 reps;15 seconds   Knee/Hip Exercises: Dipl[employer] Services operational officer 1 rep;60 seconds   Gastroc Stretch 60 seconds   Gastroc Stretch Limitations slantboard, changed to 4 inch stairs   Knee/Hip Exercises: Aerobic   Stationary Bike nustep 10' level 2 hills #1   Knee/Hip Exercises: Standing   Heel Raises 10 reps   Heel Raises Limitations with functional squat    Forward Lunges Both;10 reps   Side Lunges Both;10 reps   Lateral Step Up Both;10 reps   Forward Step Up Both;10 reps   SLS with Vectors 2 reps of 10 seconds    Other Standing Knee Exercises side step with t-band; cone rotation on foam    Knee/Hip Exercises: Seated   Other Seated Knee Exercises sit to stand x 10 Rt in back; Lt in back             PT Education - 08/15/14 1338    Education provided Yes   Education Details HEP for lateral step ups  functional squats and vectors.   Methods Explanation;Demonstration   Comprehension Verbalized understanding;Returned demonstration          PT Short Term Goals - 07/12/14 1219    PT SHORT TERM GOAL #1   Title Pt to be walking 15 minutes at least 4 times a week    Time 4   Period Weeks   Status On-going   PT SHORT TERM GOAL #2   Title Pt to state that she is able to stand for 10 minutes to make a simple meal    Time 4   Period Weeks   Status Achieved   PT SHORT TERM GOAL #3   Title strength to be improved by 1/2 grade    Time 4   Period Weeks   Status On-going           PT Long Term Goals - 07/12/14 1220    PT LONG TERM GOAL #1   Title Pt to be walking 25 minutes a day for at least 4 days a  week for better health habits   Time 8   Period Weeks   Status On-going   PT LONG TERM GOAL #2   Title Pt SLS to be at least 10 seconds to decrease risk of falling    Time 8   Period Weeks   Status On-going   PT LONG TERM GOAL #3   Title Pt to be able to stand for 20 minutes to make a meal    Time 8   Period Weeks   Status On-going               Plan - 08/15/14 1340    Clinical Impression Statement Pt continues to improve in activity tolerance and balance.  Pt improved with warrior III technique.  All exercises were facilitated by therapist.    PT Next Visit Plan reassess.         Problem List Patient Active Problem List   Diagnosis Date Noted  . Colon cancer screening 05/11/2014  . GERD (gastroesophageal reflux disease) 08/11/2013  . Bloating 08/11/2013  . Encounter for therapeutic drug monitoring 06/17/2013  . Paroxysmal atrial fibrillation 03/17/2013  . Vitamin D deficiency 03/16/2013  . Breast cancer of upper-inner quadrant of left female breast 03/16/2013  . Atrial fibrillation   . Hypertension   . Chronic anticoagulation 11/11/2010  . Hyperlipidemia 02/26/2010   Rayetta Humphrey, PT CLT [id number] 08/15/2014, 1:43 PM  [facility] [address] [place], [place], [address] Phone: [phone]   Fax:  [phone]

## 2014-08-16 ENCOUNTER — Encounter (HOSPITAL_COMMUNITY): Payer: Medicare Other | Attending: Hematology and Oncology

## 2014-08-16 DIAGNOSIS — C50212 Malignant neoplasm of upper-inner quadrant of left female breast: Secondary | ICD-10-CM | POA: Insufficient documentation

## 2014-08-16 DIAGNOSIS — Z95828 Presence of other vascular implants and grafts: Secondary | ICD-10-CM

## 2014-08-16 DIAGNOSIS — Z452 Encounter for adjustment and management of vascular access device: Secondary | ICD-10-CM

## 2014-08-16 MED ORDER — SODIUM CHLORIDE 0.9 % IJ SOLN
10.0000 mL | Freq: Once | INTRAMUSCULAR | Status: AC
  Administered 2014-08-16: 10 mL via INTRAVENOUS

## 2014-08-16 MED ORDER — HEPARIN SOD (PORK) LOCK FLUSH 100 UNIT/ML IV SOLN
500.0000 [IU] | Freq: Once | INTRAVENOUS | Status: AC
  Administered 2014-08-16: 500 [IU] via INTRAVENOUS

## 2014-08-16 MED ORDER — HEPARIN SOD (PORK) LOCK FLUSH 100 UNIT/ML IV SOLN
INTRAVENOUS | Status: AC
  Filled 2014-08-16: qty 5

## 2014-08-16 NOTE — Patient Instructions (Signed)
[place] at [facility] Discharge Instructions  RECOMMENDATIONS MADE BY THE CONSULTANT AND ANY TEST RESULTS WILL BE SENT TO YOUR REFERRING PHYSICIAN.  Port flush today as scheduled. Return every 6 weeks for port flush.  Thank you for choosing [place] at [facility] to provide your oncology and hematology care.  To afford each patient quality time with our provider, please arrive at least 15 minutes before your scheduled appointment time.    You need to re-schedule your appointment should you arrive 10 or more minutes late.  We strive to give you quality time with our providers, and arriving late affects you and other patients whose appointments are after yours.  Also, if you no show three or more times for appointments you may be dismissed from the clinic at the providers discretion.     Again, thank you for choosing [facility].  Our hope is that these requests will decrease the amount of time that you wait before being seen by our physicians.       _____________________________________________________________  Should you have questions after your visit to [facility], please contact our office at [phone] between the hours of 8:30 a.m. and 4:30 p.m.  Voicemails left after 4:30 p.m. will not be returned until the following business day.  For prescription refill requests, have your pharmacy contact our office.

## 2014-08-16 NOTE — Progress Notes (Signed)
Stacie Huang presented for Portacath access and flush. Proper placement of portacath confirmed by CXR. Portacath located right chest wall accessed with  H 20 needle. Good blood return present. Portacath flushed with 20ml NS and 500U/5ml Heparin and needle removed intact. Procedure without incident. Patient tolerated procedure well.   

## 2014-08-21 ENCOUNTER — Encounter: Payer: Self-pay | Admitting: Adult Health

## 2014-08-21 ENCOUNTER — Ambulatory Visit (INDEPENDENT_AMBULATORY_CARE_PROVIDER_SITE_OTHER): Payer: Medicare Other | Admitting: *Deleted

## 2014-08-21 ENCOUNTER — Ambulatory Visit (INDEPENDENT_AMBULATORY_CARE_PROVIDER_SITE_OTHER): Payer: Medicare Other | Admitting: Adult Health

## 2014-08-21 VITALS — BP 118/64 | HR 84 | Ht 62.0 in | Wt 144.0 lb

## 2014-08-21 DIAGNOSIS — Z7901 Long term (current) use of anticoagulants: Secondary | ICD-10-CM | POA: Diagnosis not present

## 2014-08-21 DIAGNOSIS — I482 Chronic atrial fibrillation, unspecified: Secondary | ICD-10-CM

## 2014-08-21 DIAGNOSIS — I480 Paroxysmal atrial fibrillation: Secondary | ICD-10-CM

## 2014-08-21 DIAGNOSIS — Z5181 Encounter for therapeutic drug level monitoring: Secondary | ICD-10-CM

## 2014-08-21 LAB — POCT INR: INR: 2.5

## 2014-08-21 MED ORDER — DILTIAZEM HCL ER COATED BEADS 120 MG PO CP24
ORAL_CAPSULE | ORAL | Status: DC
Start: 2014-08-21 — End: 2015-05-10

## 2014-08-21 NOTE — Progress Notes (Deleted)
Name: Stacie Huang    DOB: [birth year]  Age: 74 y.o.  MR#: [id number]       PCP:  [name], MD      Insurance: Payor: MEDICARE / Plan: MEDICARE PART A AND B / Product Type: *No Product type* /   CC:    Chief Complaint  Patient presents with  . Atrial Fibrillation    VS Filed Vitals:   08/21/14 1309  BP: 118/64  Pulse: 84  Height: '5\' 2"'  (1.575 m)  Weight: 144 lb (65.318 kg)    Weights Current Weight  08/21/14 144 lb (65.318 kg)  07/05/14 143 lb 12.8 oz (65.227 kg)  05/11/14 143 lb 9.6 oz (65.137 kg)    Blood Pressure  BP Readings from Last 3 Encounters:  08/21/14 118/64  07/05/14 111/72  05/11/14 110/63     Admit date:  (Not on file) Last encounter with RMR:  06/28/2014   Allergy Iohexol and Sulfa antibiotics  Current Outpatient Prescriptions  Medication Sig Dispense Refill  . acetaminophen (TYLENOL) 500 MG tablet Take 1,000 mg by mouth daily.    Marland Kitchen anastrozole (ARIMIDEX) 1 MG tablet Take 1 tablet (1 mg total) by mouth daily. 30 tablet 5  . atenolol (TENORMIN) 100 MG tablet Take 50 mg by mouth daily before breakfast.     . calcium carbonate (OS-CAL) 600 MG TABS tablet Take 600 mg by mouth daily.    . cetirizine (ZYRTEC) 10 MG tablet Take 10 mg by mouth every morning.     . Cholecalciferol (VITAMIN D) 400 UNITS capsule Take 400 Units by mouth every morning.     . diltiazem (CARDIZEM CD) 120 MG 24 hr capsule Take 1 tablet daily 30 capsule 3  . fluticasone (FLONASE) 50 MCG/ACT nasal spray Place 2 sprays into the nose at bedtime.     Marland Kitchen LORazepam (ATIVAN) 1 MG tablet Take 1 mg by mouth daily as needed for anxiety (AND/OR FOR SLEEP).     Marland Kitchen losartan (COZAAR) 50 MG tablet Take 50 mg by mouth every morning.     . metoCLOPramide (REGLAN) 10 MG tablet Take 1 tablet (10 mg total) by mouth 4 (four) times daily -  before meals and at bedtime. 120 tablet 5  . pantoprazole (PROTONIX) 40 MG tablet Take 1 tablet (40 mg total) by mouth 2 (two) times daily before a meal. 60 tablet 3   . psyllium (METAMUCIL) 58.6 % powder Take 1 packet by mouth daily.     . simvastatin (ZOCOR) 10 MG tablet Take 10 mg by mouth at bedtime.      . triamcinolone cream (KENALOG) 0.1 % Apply 1 application topically at bedtime.    Marland Kitchen warfarin (COUMADIN) 2.5 MG tablet Takes 2.5 mg (1 tablet) on Tuesday and Friday takes 1.25 mg (0.5 tablet) all other days 30 tablet 6   No current facility-administered medications for this visit.    Discontinued Meds:   There are no discontinued medications.  Patient Active Problem List   Diagnosis Date Noted  . Colon cancer screening 05/11/2014  . GERD (gastroesophageal reflux disease) 08/11/2013  . Bloating 08/11/2013  . Encounter for therapeutic drug monitoring 06/17/2013  . Paroxysmal atrial fibrillation 03/17/2013  . Vitamin D deficiency 03/16/2013  . Breast cancer of upper-inner quadrant of left female breast 03/16/2013  . Atrial fibrillation   . Hypertension   . Chronic anticoagulation 11/11/2010  . Hyperlipidemia 02/26/2010    LABS    Component Value Date/Time   NA 137 07/05/2014 1219  NA 138 04/05/2014 1000   NA 140 02/22/2014 1203   K 4.5 07/05/2014 1219   K 4.3 04/05/2014 1000   K 4.3 02/22/2014 1203   CL 104 07/05/2014 1219   CL 102 04/05/2014 1000   CL 101 02/22/2014 1203   CO2 25 07/05/2014 1219   CO2 24 04/05/2014 1000   CO2 27 02/22/2014 1203   GLUCOSE 84 07/05/2014 1219   GLUCOSE 110* 04/05/2014 1000   GLUCOSE 79 02/22/2014 1203   BUN 15 07/05/2014 1219   BUN 15 04/05/2014 1000   BUN 14 02/22/2014 1203   CREATININE 0.99 07/05/2014 1219   CREATININE 0.91 04/05/2014 1000   CREATININE 0.86 02/22/2014 1203   CALCIUM 9.2 07/05/2014 1219   CALCIUM 9.1 04/05/2014 1000   CALCIUM 9.4 02/22/2014 1203   GFRNONAA 55* 07/05/2014 1219   GFRNONAA 61* 04/05/2014 1000   GFRNONAA 65* 02/22/2014 1203   GFRAA 63* 07/05/2014 1219   GFRAA 70* 04/05/2014 1000   GFRAA 75* 02/22/2014 1203   CMP     Component Value Date/Time   NA 137  07/05/2014 1219   K 4.5 07/05/2014 1219   CL 104 07/05/2014 1219   CO2 25 07/05/2014 1219   GLUCOSE 84 07/05/2014 1219   BUN 15 07/05/2014 1219   CREATININE 0.99 07/05/2014 1219   CALCIUM 9.2 07/05/2014 1219   PROT 7.0 07/05/2014 1219   ALBUMIN 4.2 07/05/2014 1219   AST 25 07/05/2014 1219   ALT 13 07/05/2014 1219   ALKPHOS 47 07/05/2014 1219   BILITOT 0.7 07/05/2014 1219   GFRNONAA 55* 07/05/2014 1219   GFRAA 63* 07/05/2014 1219       Component Value Date/Time   WBC 8.4 07/05/2014 1219   WBC 6.9 04/05/2014 1000   WBC 9.1 02/22/2014 1203   HGB 12.9 07/05/2014 1219   HGB 12.4 04/05/2014 1000   HGB 12.0 02/22/2014 1203   HCT 40.2 07/05/2014 1219   HCT 39.3 04/05/2014 1000   HCT 37.1 02/22/2014 1203   MCV 93.7 07/05/2014 1219   MCV 93.8 04/05/2014 1000   MCV 91.6 02/22/2014 1203    Lipid Panel     Component Value Date/Time   CHOL 150 01/29/2010 1155   TRIG 135 01/29/2010 1155   HDL 63 01/29/2010 1155   LDLCALC 60 01/29/2010 1155    ABG No results found for: PHART, PCO2ART, PO2ART, HCO3, TCO2, ACIDBASEDEF, O2SAT   No results found for: TSH BNP (last 3 results) No results for input(s): BNP in the last 8760 hours.  ProBNP (last 3 results) No results for input(s): PROBNP in the last 8760 hours.  Cardiac Panel (last 3 results) No results for input(s): CKTOTAL, CKMB, TROPONINI, RELINDX in the last 72 hours.  Iron/TIBC/Ferritin/ %Sat No results found for: IRON, TIBC, FERRITIN, IRONPCTSAT   EKG Orders placed or performed in visit on 08/21/14  . EKG 12-Lead     Prior Assessment and Plan Problem List as of 08/21/2014      Cardiovascular and Mediastinum   Atrial fibrillation   Last Assessment & Plan 02/11/2013 Office Visit Written 02/11/2013  4:47 PM by Lendon Colonel, NP    Heart rate is well-controlled on current medication regimen. She remains on Coumadin. The question concerning holding Coumadin and with Lovenox bridge will be addressed once surgery date  has been est. The patient will be given a letter clearing her for surgery, as she has had a recent stress Myoview approximately 3 years ago which was normal and negative for ischemia.  She is clear from a cardiac standpoint concerning surgery as a low risk, with the exception of Coumadin dose adjusting and Lovenox bridging. Once date has been set, we will make further recommendations concerning bridging and or holding Coumadin prior to surgery.      Hypertension   Last Assessment & Plan 02/11/2013 Office Visit Written 02/11/2013  4:48 PM by Lendon Colonel, NP    Good control of blood pressure. We will not make any changes in her medication regimen at this time.      Paroxysmal atrial fibrillation     Digestive   GERD (gastroesophageal reflux disease)   Last Assessment & Plan 05/11/2014 Office Visit Written 05/11/2014  3:13 PM by Mahala Menghini, PA-C    Doing very well on current regimen including pantoprazole 40 mg twice a day. Just completing chemotherapy. Patient desires continue current regimen given that she finally is feeling well. Readdress at her next office visit in 6 months.        Other   Hyperlipidemia   Last Assessment & Plan 02/11/2013 Office Visit Written 02/11/2013  4:48 PM by Lendon Colonel, NP    She will continue ongoing low cholesterol diet. She will need followup lipids and LFTs per her primary care physician within the next 6 months.      Chronic anticoagulation   Last Assessment & Plan 05/26/2011 Office Visit Written 05/26/2011  3:57 PM by Yehuda Savannah, MD    Stable and therapeutic INRs.  CBC and stool Hemoccults will be monitored to exclude occult GI blood loss.      Vitamin D deficiency   Breast cancer of upper-inner quadrant of left female breast   Last Assessment & Plan 07/05/2014 Office Visit Edited 07/05/2014  8:07 AM by Baird Cancer, PA-C    stage IA invasive breast cancer, ER positive, PR positive, HER-2/neu over amplified; S/P Taxol/Herceptin and  after 2 cycles of this regimen develop an intolerance to Taxol. Now, S/P 12 months worth of Herceptin therapy every 3 weeks. Currently on Anastrozole started on 06/28/2013.   Bone density exam in April 2015 was normal.  Cardiac MUGA at the conclusion of Herceptin therapy demonstrates a LVEF of 53% which is stable.  Labs today: CBC diff, CMET, Vit D level.  Compliance with Anastrozole encouraged. Next mammogram is due in October 2016.  Labs in 4 months: CBC diff, CMET.  Return in 4 months for follow-up.      Encounter for therapeutic drug monitoring   Bloating   Colon cancer screening   Last Assessment & Plan 05/11/2014 Office Visit Written 05/11/2014  3:14 PM by Mahala Menghini, PA-C    Overdue for screening colonoscopy. Patient does not want to pursue at this time given that she just completed chemotherapy for breast cancer. This is a very reasonable approach and we will discuss possible colonoscopy at her next office visit in 6 months if she desires.          Imaging: No results found.

## 2014-08-21 NOTE — Progress Notes (Signed)
Cardiology Office Note   Date:  08/21/2014   ID:  Stacie Huang, DOB 10-31-1940, MRN [id number]  PCP:  Stacie Bellow, MD  Cardiologist:  To be established with Branch/ Stacie Sims, NP   Chief Complaint  Patient presents with  . Atrial Fibrillation      History of Present Illness: Stacie Huang is a 74 y.o. female who presents for ongoing assessment and management of atrial fibrillation, on chronic Coumadin therapy,CHADS VASC Score 3.she is here for annual follow-up. She was last seen in the office on 02/11/2013 and was stable from a cardiovascular standpoint.   Since being seen last, the patient is been diagnosed with breast cancer on the right, and has been having chemotherapy.  At [facility] at [facility] on the fourth floor.she has not received radiation.  She is taking medication called, Heptrin.  She is followed in our Coumadin clinic here in [place], and is due to be seen today as well.  She has not had visits  since being seen last.    Past Medical History  Diagnosis Date  . Hypertension   . Paroxysmal atrial fibrillation   . Chronic anticoagulation   . Hyperlipidemia   . GERD (gastroesophageal reflux disease)   . Cancer   . Breast cancer     Past Surgical History  Procedure Laterality Date  . Abdominal hysterectomy    . Cholecystectomy    . Partial mastectomy with axillary sentinel lymph node biopsy Left 02/23/2013    Procedure: LEFT PARTIAL MASTECTOMY WITH AXILLARY SIMPLE NODE BIOPSY, LEFT BREAST WIDE EXCISION;  Surgeon: Scherry Ran, MD;  Location: [facility];  Service: General;  Laterality: Left;  . Axillary lymph node dissection Left 02/23/2013    Procedure: LEFT AXILLARY LYMPH NODE DISSECTION;  Surgeon: Scherry Ran, MD;  Location: [facility];  Service: General;  Laterality: Left;  . Portacath placement Right 04/07/2013  . Portacath placement Right 04/07/2013    Procedure: INSERTION PORT-A-CATH;  Surgeon: Scherry Ran, MD;  Location: [facility];   Service: General;  Laterality: Right;  . Esophagogastroduodenoscopy  03/10/2002    DJS:HFWYOV esophagus/small HH/Tiny AVM in antrum, otherwise normal stomach and D1 and D2/Status post passage of the [facility] dilator  . Colonoscopy  03/10/2002    RMR: Incomplete colonoscopy (sigmoidoscopy)/Normal rectum/Normal colon to 40 cm.      Current Outpatient Prescriptions  Medication Sig Dispense Refill  . acetaminophen (TYLENOL) 500 MG tablet Take 1,000 mg by mouth daily.    Marland Kitchen anastrozole (ARIMIDEX) 1 MG tablet Take 1 tablet (1 mg total) by mouth daily. 30 tablet 5  . atenolol (TENORMIN) 100 MG tablet Take 50 mg by mouth daily before breakfast.     . calcium carbonate (OS-CAL) 600 MG TABS tablet Take 600 mg by mouth daily.    . cetirizine (ZYRTEC) 10 MG tablet Take 10 mg by mouth every morning.     . Cholecalciferol (VITAMIN D) 400 UNITS capsule Take 400 Units by mouth every morning.     . diltiazem (CARDIZEM CD) 120 MG 24 hr capsule Take 1 tablet daily 30 capsule 3  . fluticasone (FLONASE) 50 MCG/ACT nasal spray Place 2 sprays into the nose at bedtime.     Marland Kitchen LORazepam (ATIVAN) 1 MG tablet Take 1 mg by mouth daily as needed for anxiety (AND/OR FOR SLEEP).     Marland Kitchen losartan (COZAAR) 50 MG tablet Take 50 mg by mouth every morning.     . metoCLOPramide (REGLAN) 10 MG tablet Take 1  tablet (10 mg total) by mouth 4 (four) times daily -  before meals and at bedtime. 120 tablet 5  . pantoprazole (PROTONIX) 40 MG tablet Take 1 tablet (40 mg total) by mouth 2 (two) times daily before a meal. 60 tablet 3  . psyllium (METAMUCIL) 58.6 % powder Take 1 packet by mouth daily.     . simvastatin (ZOCOR) 10 MG tablet Take 10 mg by mouth at bedtime.      . triamcinolone cream (KENALOG) 0.1 % Apply 1 application topically at bedtime.    Marland Kitchen warfarin (COUMADIN) 2.5 MG tablet Takes 2.5 mg (1 tablet) on Tuesday and Friday takes 1.25 mg (0.5 tablet) all other days 30 tablet 6   No current facility-administered medications for  this visit.    Allergies:   Iohexol and Sulfa antibiotics    Social History:  The patient  reports that she has never smoked. She has never used smokeless tobacco. She reports that she does not drink alcohol or use illicit drugs.   Family History:  The patient's family history includes Cancer in an other family member; Heart attack in her father; Heart disease in her mother; Leukemia in an other family member.    ROS: .   All other systems are reviewed and negative.Unless otherwise mentioned in H&P above.   PHYSICAL EXAM: VS:  There were no vitals taken for this visit. , BMI There is no weight on file to calculate BMI. GEN: Well nourished, well developed, in no acute distress HEENT: normal Neck: no JVD, carotid bruits, or masses Cardiac: IRRR; no murmurs, rubs, or gallops,no edema  Respiratory:  clear to auscultation bilaterally, normal work of breathing GI: soft, nontender, nondistended, + BS MS: no deformity or atrophyPort-A-Cath in the upper right chest. Skin: warm and dry, no rash Neuro:  Strength and sensation are intact Psych: euthymic mood, full affect   EKG:  The ekg ordered today demonstrates atrial fibrillation rate of 85 beats per minute.   Recent Labs: 07/05/2014: ALT 13; BUN 15; Creatinine 0.99; Hemoglobin 12.9; Platelets 253; Potassium 4.5; Sodium 137    Lipid Panel    Component Value Date/Time   CHOL 150 01/29/2010 1155   TRIG 135 01/29/2010 1155   HDL 63 01/29/2010 1155   LDLCALC 60 01/29/2010 1155      Wt Readings from Last 3 Encounters:  07/05/14 143 lb 12.8 oz (65.227 kg)  05/11/14 143 lb 9.6 oz (65.137 kg)  04/05/14 144 lb (65.318 kg)      Other studies Reviewed: Additional studies/ records that were reviewed today include: None Review of the above records demonstrates: N/A   ASSESSMENT AND PLAN:  1.  Atrial fibrillation: She remains on Coumadin therapy, diltiazem, atenolol, with heart rate control, and no evidence of bleeding, complaints  of palpitations, or chest pain.  We will continue her current medication regimen, and see her in 6 months.  2. Hypertension: Excellent control of blood pressure currently.  No changes in her medicine.date to chemotherapy, I will check echocardiogram for changes in LV function for comparative films to previous echo.  Labs are being followed by oncology and her primary care physician.   Current medicines are reviewed at length with the patient today.    Labs/ tests ordered today include: None No orders of the defined types were placed in this encounter.     Disposition:   FU with [place] in 6 months. Needs to be established with Dr. Harl Bowie Signed, Stacie Sims, NP  08/21/2014 7:07 AM  [facility] [address] [place], [place] [address] Phone: [phone]; Fax: [phone]

## 2014-08-21 NOTE — Patient Instructions (Signed)
Your physician wants you to follow-up in: 6 months with Jory Sims, NP. You will receive a reminder letter in the mail two months in advance. If you don't receive a letter, please call our office to schedule the follow-up appointment.  Your physician recommends that you continue on your current medications as directed. Please refer to the Current Medication list given to you today.  Your physician has requested that you have an echocardiogram. Echocardiography is a painless test that uses sound waves to create images of your heart. It provides your doctor with information about the size and shape of your heart and how well your heart's chambers and valves are working. This procedure takes approximately one hour. There are no restrictions for this procedure.  Thank you for choosing [facility]!

## 2014-08-22 ENCOUNTER — Ambulatory Visit (HOSPITAL_COMMUNITY): Payer: Medicare Other | Admitting: Physical Therapy

## 2014-08-22 DIAGNOSIS — R5383 Other fatigue: Secondary | ICD-10-CM | POA: Diagnosis not present

## 2014-08-22 DIAGNOSIS — R29898 Other symptoms and signs involving the musculoskeletal system: Secondary | ICD-10-CM

## 2014-08-22 DIAGNOSIS — R530 Neoplastic (malignant) related fatigue: Secondary | ICD-10-CM

## 2014-08-22 NOTE — Therapy (Signed)
[facility] [place], [place], [address] Phone: [phone]   Fax:  [phone]  Physical Therapy Treatment  Patient Details  Name: Stacie Huang MRN: [id number] Date of Birth: Dec 24, 1940 Referring Provider:  Lemmie Evens, MD  Encounter Date: 08/22/2014      PT End of Session - 08/22/14 1428    Visit Number 8   Number of Visits 8   Date for PT Re-Evaluation 08/17/14   Authorization Type medicare   Authorization - Visit Number 8   Authorization - Number of Visits 8   PT Start Time 1300   PT Stop Time 1350   PT Time Calculation (min) 50 min   Activity Tolerance Patient tolerated treatment well   Behavior During Therapy [facility] for tasks assessed/performed      Past Medical History  Diagnosis Date  . Hypertension   . Paroxysmal atrial fibrillation   . Chronic anticoagulation   . Hyperlipidemia   . GERD (gastroesophageal reflux disease)   . Cancer   . Breast cancer     Past Surgical History  Procedure Laterality Date  . Abdominal hysterectomy    . Cholecystectomy    . Partial mastectomy with axillary sentinel lymph node biopsy Left 02/23/2013    Procedure: LEFT PARTIAL MASTECTOMY WITH AXILLARY SIMPLE NODE BIOPSY, LEFT BREAST WIDE EXCISION;  Surgeon: Scherry Ran, MD;  Location: [facility];  Service: General;  Laterality: Left;  . Axillary lymph node dissection Left 02/23/2013    Procedure: LEFT AXILLARY LYMPH NODE DISSECTION;  Surgeon: Scherry Ran, MD;  Location: [facility];  Service: General;  Laterality: Left;  . Portacath placement Right 04/07/2013  . Portacath placement Right 04/07/2013    Procedure: INSERTION PORT-A-CATH;  Surgeon: Scherry Ran, MD;  Location: [facility];  Service: General;  Laterality: Right;  . Esophagogastroduodenoscopy  03/10/2002    ZGY:FVCBSW esophagus/small HH/Tiny AVM in antrum, otherwise normal stomach and D1 and D2/Status post passage of the [facility] dilator  . Colonoscopy   03/10/2002    RMR: Incomplete colonoscopy (sigmoidoscopy)/Normal rectum/Normal colon to 40 cm.     There were no vitals filed for this visit.  Visit Diagnosis:  Neoplastic malignant related fatigue  Other fatigue  Leg weakness, bilateral      Subjective Assessment - 08/22/14 1304    Subjective Pt states she has not been exercising as she should but she has been walking.   She has been walking ten minutes at a time.    How long can you sit comfortably? no problem   How long can you stand comfortably? Pt is able to stand for ten minutes was five.    How long can you walk comfortably? Pt is only walking ten minutes at a time.     Patient Stated Goals To be able to do her housework and cook like she use to.  Pt states she is not cooking due to it just being her and she is not doing very much housework she attributes this to laziness.    Currently in Pain? No/denies  At night she has hip pain             [facility] PT Assessment - 08/22/14 0001    Assessment   Medical Diagnosis weakness    Onset Date 01/31/14   Precautions   Precautions Other (comment)   Precaution Comments neutropenic   Restrictions   Weight Bearing Restrictions No   Observation/Other Assessments   Focus on Therapeutic Outcomes (FOTO)  FACIT-F 102/160; was 98 on 3/16 ; on 1/6 was 63.8  VASpain-2 was 5.6 on 3/16 ans 6.0 on 1/6; distress 2 was 6   Other Surveys  --  VAS fatige 5.6 on 3/16 was 5.6 on 1/6 was 8.3;   Strength   Right Hip Flexion 5/5  was 4/5   Right Hip Extension 5/5   Right Hip ABduction 5/5  was 4/5    Left Hip Flexion 5/5  was 4/5    Left Hip Extension 5/5  was 4/5   Left Hip ABduction 5/5  was 4/5   Right Knee Flexion 5/5   Right Knee Extension 5/5   Left Knee Flexion 5/5  was 4/5   Left Knee Extension 5/5   Right Ankle Dorsiflexion 5/5   Left Ankle Dorsiflexion 5/5   Timed Up and Go Test   Normal TUG (seconds) 8.01  was 11.4 initially; on 07/12/2014 was 7.73. Cut off normal 13                      [facility] Adult PT Treatment/Exercise - 08/22/14 0001    Knee/Hip Exercises: Stretches   Active Hamstring Stretch 3 reps;30 seconds   Active Hamstring Stretch Limitations knee to chest 30 sec x 3 B; LTR x 5    Knee/Hip Exercises: Aerobic   Stationary Bike nustep hills 3; level 4 x 15:00   Knee/Hip Exercises: Standing   SLS x3 B    Other Standing Knee Exercises tandem stance x 3;                 PT Education - 08/22/14 1400    Education provided Yes   Education Details for Balance and Low back stretches.    Person(s) Educated Patient   Methods Explanation;Handout   Comprehension Verbalized understanding          PT Short Term Goals - 08/22/14 1322    PT SHORT TERM GOAL #1   Title Pt to be walking 15 minutes at least 4 times a week    Baseline Pt has not tried to walk any longer than ten minutes a day.    Time 4   Period Weeks   Status On-going   PT SHORT TERM GOAL #2   Title Pt to state that she is able to stand for 10 minutes to make a simple meal    Time 4   Period Weeks   Status Achieved   PT SHORT TERM GOAL #3   Title strength to be improved by 1/2 grade    Time 4   Period Weeks   Status Achieved           PT Long Term Goals - 08/22/14 1324    PT LONG TERM GOAL #1   Title Pt to be walking 25 minutes a day for at least 4 days a week for better health habits   Time 8   Period Weeks   Status Not Met   PT LONG TERM GOAL #3   Title Pt to be able to stand for 20 minutes to make a meal    Time 8   Period Weeks   Status Achieved               Plan - 08/22/14 1430    Clinical Impression Statement Pt reassessed all goals met except cooking and walking.  There goals have not been met secondary to the patient not attempting them.  Therapist and pt went over  in length the importance to slowly increase her walking from ten minutes to thirty minutes a day.  Pt states she has not cooked evern though she enjoys if due to the  fact that it is just her.  Therapist explained that if she enjoys cooking she should cook whether she splits the meal and freezes half for a later date or make a pie and take it into Sunday school to share.  PT has improved significantly in FACIT-F and VAS scores.  Although pt states she will miss treatment pt has been educated and is no longer in need of skilled care.  Pt would benefit from joining [facility] but pt states she has not way to get there due to her not driving.     PT Next Visit Plan discharge.           G-Codes - Aug 30, 2014 1436    Functional Assessment Tool Used TUG;    Functional Limitation Mobility: Walking and moving around   Mobility: Walking and Moving Around Goal Status [phone]) At least 20 percent but less than 40 percent impaired, limited or restricted   Mobility: Walking and Moving Around Discharge Status [phone]) At least 1 percent but less than 20 percent impaired, limited or restricted      Problem List Patient Active Problem List   Diagnosis Date Noted  . Colon cancer screening 05/11/2014  . GERD (gastroesophageal reflux disease) 08/11/2013  . Bloating 08/11/2013  . Encounter for therapeutic drug monitoring 06/17/2013  . Paroxysmal atrial fibrillation 03/17/2013  . Vitamin D deficiency 03/16/2013  . Breast cancer of upper-inner quadrant of left female breast 03/16/2013  . Atrial fibrillation   . Hypertension   . Chronic anticoagulation 11/11/2010  . Hyperlipidemia 02/26/2010  Rayetta Humphrey, PT CLT [phone] 2014-08-30, 2:38 PM  [facility] [address] [place], [place], [address] Phone: [phone]   Fax:  [phone]   PHYSICAL THERAPY DISCHARGE SUMMARY  Plan: Patient agrees to discharge.  Patient goals were met. Patient is being discharged due to meeting the stated rehab goals.  ?????    Rayetta Humphrey, [facility] CLT [phone]

## 2014-08-22 NOTE — Patient Instructions (Signed)
Knee-to-Chest Stretch: Unilateral   With hand behind right knee, pull knee in to chest until a comfortable stretch is felt in lower back and buttocks. Keep back relaxed. Hold _30___ seconds. Repeat __3__ times per set. Do _1___ sets per session. Do _2___ sessions per day.  http://orth.exer.us/126   Copyright  VHI. All rights reserved.  Stretching: Hamstring (Supine)   Supporting right thigh behind knee, slowly straighten knee until stretch is felt in back of thigh. Hold 30____ seconds. Repeat __3__ times per set. Do ___1_ sets per session. Do ___2_ sessions per day.  http://orth.exer.us/656   Copyright  VHI. All rights reserved.  Lower Trunk Rotation Stretch   Keeping back flat and feet together, rotate knees to left side. Hold _3___ seconds. Repeat ___5_ times per set. Do 1____ sets per session. Do _2___ sessions per day.  http://orth.exer.us/122   Copyright  VHI. All rights reserved.  With Support   Stand on one leg in neutral spine holding support. Hold ____ seconds. Repeat on other leg. Do ____ repetitions, ____ sets.  http://bt.exer.us/34   Copyright  VHI. All rights reserved.  Getting Into / Out of Bed   Lower self to lie down on one side by raising legs and lowering head at the same time. Use arms to assist moving without twisting. Bend both knees to roll onto back if desired. To sit up, start from lying on side, and use same move-ments in reverse. Keep trunk aligned with legs.   Copyright  VHI. All rights reserved.

## 2014-08-25 ENCOUNTER — Ambulatory Visit (HOSPITAL_COMMUNITY)
Admission: RE | Admit: 2014-08-25 | Discharge: 2014-08-25 | Disposition: A | Discharge status: Home / Self Care | Payer: Medicare Other | Source: Ambulatory Visit | Attending: Adult Health | Admitting: Adult Health

## 2014-08-25 DIAGNOSIS — R9431 Abnormal electrocardiogram [ECG] [EKG]: Secondary | ICD-10-CM | POA: Diagnosis present

## 2014-08-25 DIAGNOSIS — I4891 Unspecified atrial fibrillation: Secondary | ICD-10-CM | POA: Insufficient documentation

## 2014-08-25 DIAGNOSIS — I071 Rheumatic tricuspid insufficiency: Secondary | ICD-10-CM | POA: Insufficient documentation

## 2014-08-25 DIAGNOSIS — I482 Chronic atrial fibrillation, unspecified: Secondary | ICD-10-CM

## 2014-08-25 DIAGNOSIS — I370 Nonrheumatic pulmonary valve stenosis: Secondary | ICD-10-CM | POA: Diagnosis not present

## 2014-08-25 NOTE — Progress Notes (Signed)
  Echocardiogram 2D Echocardiogram has been performed.  Stacie Huang 08/25/2014, 2:46 PM

## 2014-08-29 ENCOUNTER — Ambulatory Visit (HOSPITAL_COMMUNITY): Payer: Medicare Other | Admitting: Physical Therapy

## 2014-09-18 ENCOUNTER — Other Ambulatory Visit: Payer: Self-pay | Admitting: Internal Medicine

## 2014-09-27 ENCOUNTER — Encounter (HOSPITAL_COMMUNITY): Payer: Medicare Other | Attending: Hematology & Oncology

## 2014-09-27 ENCOUNTER — Encounter (HOSPITAL_COMMUNITY): Payer: Self-pay

## 2014-09-27 DIAGNOSIS — Z452 Encounter for adjustment and management of vascular access device: Secondary | ICD-10-CM

## 2014-09-27 DIAGNOSIS — C50212 Malignant neoplasm of upper-inner quadrant of left female breast: Secondary | ICD-10-CM

## 2014-09-27 MED ORDER — HEPARIN SOD (PORK) LOCK FLUSH 100 UNIT/ML IV SOLN
500.0000 [IU] | Freq: Once | INTRAVENOUS | Status: AC
  Administered 2014-09-27: 500 [IU] via INTRAVENOUS

## 2014-09-27 MED ORDER — SODIUM CHLORIDE 0.9 % IJ SOLN
10.0000 mL | INTRAMUSCULAR | Status: DC | PRN
  Administered 2014-09-27: 10 mL via INTRAVENOUS
  Filled 2014-09-27: qty 10

## 2014-09-27 NOTE — Progress Notes (Signed)
Stacie Huang presented for Portacath access and flush.  Portacath located right chest wall accessed with  H 20 needle.  Good blood return present. Portacath flushed with 67ml NS and 500U/46ml Heparin and needle removed intact.  Procedure tolerated well and without incident.

## 2014-10-02 ENCOUNTER — Ambulatory Visit (INDEPENDENT_AMBULATORY_CARE_PROVIDER_SITE_OTHER): Payer: Medicare Other | Admitting: *Deleted

## 2014-10-02 DIAGNOSIS — Z5181 Encounter for therapeutic drug level monitoring: Secondary | ICD-10-CM | POA: Diagnosis not present

## 2014-10-02 DIAGNOSIS — Z7901 Long term (current) use of anticoagulants: Secondary | ICD-10-CM

## 2014-10-02 DIAGNOSIS — I4891 Unspecified atrial fibrillation: Secondary | ICD-10-CM | POA: Diagnosis not present

## 2014-10-02 LAB — POCT INR: INR: 3

## 2014-10-18 ENCOUNTER — Encounter: Payer: Self-pay | Admitting: Gastroenterology

## 2014-11-08 ENCOUNTER — Encounter (HOSPITAL_COMMUNITY): Payer: Self-pay | Admitting: Hematology & Oncology

## 2014-11-08 ENCOUNTER — Encounter (HOSPITAL_COMMUNITY): Payer: Medicare Other | Attending: Hematology & Oncology

## 2014-11-08 ENCOUNTER — Encounter (HOSPITAL_COMMUNITY): Payer: Medicare Other | Attending: Hematology and Oncology | Admitting: Hematology & Oncology

## 2014-11-08 DIAGNOSIS — C50212 Malignant neoplasm of upper-inner quadrant of left female breast: Secondary | ICD-10-CM | POA: Insufficient documentation

## 2014-11-08 DIAGNOSIS — I4891 Unspecified atrial fibrillation: Secondary | ICD-10-CM

## 2014-11-08 LAB — COMPREHENSIVE METABOLIC PANEL
ALT: 16 U/L (ref 14–54)
AST: 21 U/L (ref 15–41)
Albumin: 4.1 g/dL (ref 3.5–5.0)
Alkaline Phosphatase: 48 U/L (ref 38–126)
Anion gap: 7 (ref 5–15)
BUN: 15 mg/dL (ref 6–20)
CHLORIDE: 104 mmol/L (ref 101–111)
CO2: 27 mmol/L (ref 22–32)
Calcium: 8.9 mg/dL (ref 8.9–10.3)
Creatinine, Ser: 0.93 mg/dL (ref 0.44–1.00)
GFR calc Af Amer: >60 mL/min (ref >60)
GFR, EST NON AFRICAN AMERICAN: 59 mL/min — AB (ref >60)
Glucose, Bld: 97 mg/dL (ref 65–99)
Potassium: 4.3 mmol/L (ref 3.5–5.1)
SODIUM: 138 mmol/L (ref 135–145)
Total Bilirubin: 0.8 mg/dL (ref 0.3–1.2)
Total Protein: 6.8 g/dL (ref 6.5–8.1)

## 2014-11-08 LAB — CBC WITH DIFFERENTIAL/PLATELET
BASOS ABS: 0 10*3/uL (ref 0.0–0.1)
BASOS PCT: 1 % (ref 0–1)
Eosinophils Absolute: 0.1 10*3/uL (ref 0.0–0.7)
Eosinophils Relative: 1 % (ref 0–5)
HCT: 40 % (ref 36.0–46.0)
Hemoglobin: 13 g/dL (ref 12.0–15.0)
LYMPHS ABS: 2.8 10*3/uL (ref 0.7–4.0)
Lymphocytes Relative: 35 % (ref 12–46)
MCH: 30.2 pg (ref 26.0–34.0)
MCHC: 32.5 g/dL (ref 30.0–36.0)
MCV: 92.8 fL (ref 78.0–100.0)
MONOS PCT: 10 % (ref 3–12)
Monocytes Absolute: 0.8 10*3/uL (ref 0.1–1.0)
NEUTROS ABS: 4.4 10*3/uL (ref 1.7–7.7)
Neutrophils Relative %: 54 % (ref 43–77)
Platelets: 299 10*3/uL (ref 150–400)
RBC: 4.31 MIL/uL (ref 3.87–5.11)
RDW: 13.4 % (ref 11.5–15.5)
WBC: 8.1 10*3/uL (ref 4.0–10.5)

## 2014-11-08 MED ORDER — HEPARIN SOD (PORK) LOCK FLUSH 100 UNIT/ML IV SOLN
INTRAVENOUS | Status: AC
  Filled 2014-11-08: qty 5

## 2014-11-08 MED ORDER — SODIUM CHLORIDE 0.9 % IJ SOLN
10.0000 mL | INTRAMUSCULAR | Status: DC | PRN
  Administered 2014-11-08: 10 mL via INTRAVENOUS
  Filled 2014-11-08: qty 10

## 2014-11-08 MED ORDER — HEPARIN SOD (PORK) LOCK FLUSH 100 UNIT/ML IV SOLN
500.0000 [IU] | Freq: Once | INTRAVENOUS | Status: AC
  Administered 2014-11-08: 500 [IU] via INTRAVENOUS

## 2014-11-08 NOTE — Progress Notes (Signed)
[name], MD [address] [place] [address]  Breast cancer of upper-inner quadrant of left female breast - Plan: CBC with Differential, Comprehensive metabolic panel, heparin lock flush 100 unit/mL, sodium chloride 0.9 % injection 10 mL, MM Digital Screening  CURRENT THERAPY: Anastrozole 1 mg daily beginning on 06/28/2013  INTERVAL HISTORY: Stacie Huang 74 y.o. 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 Now, S/P 12 months worth of Herceptin therapy every 3 weeks. Currently on Anastrozole started on 06/28/2013.   She continues to tolerate endocrine therapy well.  She notes mild hot flashes and reports that they do not interfere with QOL.  She denies any arthralgias or myalgias that are new or worse since starting Anastrozole.    Oncologically, she denies any complaints and ROS questioning is negative.   She lives alone and does not have a good family support group.  She does self breast examinations and denies any new lumps or bumps. The patient takes Calcium and Vitamin D daily. She is occasionally fatigued. The patients says that her appetite is fine.  Her bowels are normal. She has had a colonoscopy done before.  She has had a hysterectomy.  Her primary is Dr. Karie Kirks, [place], [place] She expresses no other concerns at this time.     Breast cancer of upper-inner quadrant of left female breast   02/04/2013 Initial Diagnosis Breast cancer of upper-inner quadrant of left female breast   02/23/2013 Surgery left lumpectomy (UIQ), sentinel node biopsy--0.7cm, Node negative, ER+, PR+, HER-2/neu over expressed.   04/08/2013 - 04/14/2013 Chemotherapy Paclitaxel/Herceptin weekly x 12. Intolerance to Paclitaxel and only receiving 2 cycles   05/04/2013 - 04/05/2014 Chemotherapy Herceptin every 21 days    06/28/2013 -  Chemotherapy Anastrazole started   08/02/2013 Imaging Bone density- normal   04/06/2014 Imaging MUGA- Left ventricular ejection fraction equals 53%.    Past Medical History  Diagnosis Date  . Hypertension   . Paroxysmal atrial fibrillation   . Chronic anticoagulation   . Hyperlipidemia   . GERD (gastroesophageal reflux disease)   . Cancer   . Breast cancer     has Hyperlipidemia; Chronic anticoagulation; Atrial fibrillation; Hypertension; Vitamin D deficiency; Breast cancer of upper-inner quadrant of left female breast; Paroxysmal atrial fibrillation; Encounter for therapeutic drug monitoring; GERD (gastroesophageal reflux disease); Bloating; and Colon cancer screening on her problem list.     is allergic to iohexol and sulfa antibiotics.  We administered heparin lock flush and sodium chloride.  Past Surgical History  Procedure Laterality Date  . Abdominal hysterectomy    . Cholecystectomy    . Partial mastectomy with axillary sentinel lymph node biopsy Left 02/23/2013    Procedure: LEFT PARTIAL MASTECTOMY WITH AXILLARY SIMPLE NODE BIOPSY, LEFT BREAST WIDE EXCISION;  Surgeon: Scherry Ran, MD;  Location: [facility];  Service: General;  Laterality: Left;  . Axillary lymph node dissection Left 02/23/2013    Procedure: LEFT AXILLARY LYMPH NODE DISSECTION;  Surgeon: Scherry Ran, MD;  Location: [facility];  Service: General;  Laterality: Left;  . Portacath placement Right 04/07/2013  . Portacath placement Right 04/07/2013    Procedure: INSERTION PORT-A-CATH;  Surgeon: Scherry Ran, MD;  Location: [facility];  Service: General;  Laterality: Right;  . Esophagogastroduodenoscopy  03/10/2002    FMB:[id number] esophagus/small HH/Tiny AVM in antrum, otherwise normal  stomach and D1 and D2/Status post passage of the [facility] dilator  . Colonoscopy  03/10/2002    RMR: Incomplete colonoscopy (sigmoidoscopy)/Normal rectum/Normal colon to 40 cm.     Denies any headaches, dizziness,  double vision, fevers, chills, night sweats, nausea, vomiting, diarrhea, constipation, chest pain, heart palpitations, shortness of breath, blood in stool, black tarry stool, urinary pain, urinary burning, urinary frequency, hematuria. 14 point review of systems was performed and is negative except as detailed under history of present illness and above    PHYSICAL EXAMINATION  ECOG PERFORMANCE STATUS: 0 - Asymptomatic  Filed Vitals:   11/08/14 1106  BP: 119/80  Pulse: 80  Temp: 98.1 F (36.7 C)  Resp: 18    GENERAL:alert, no distress, well nourished, well developed, comfortable, cooperative and smiling SKIN: skin color, texture, turgor are normal, no rashes or significant lesions HEAD: Normocephalic, No masses, lesions, tenderness or abnormalities EYES: normal, PERRLA, EOMI, Conjunctiva are pink and non-injected EARS: External ears normal OROPHARYNX:mucous membranes are moist  NECK: supple, no adenopathy, thyroid normal size, non-tender, without nodularity, no stridor, non-tender, trachea midline LYMPH:  no palpable lymphadenopathy, no hepatosplenomegaly BREAST:right breast normal without mass, skin or nipple changes or axillary nodes, left post-lumpectomy site well healed and free of suspicious changes LUNGS: clear to auscultation and percussion HEART: AFib, rate controlled, no murmur ABDOMEN:abdomen soft, non-tender, normal bowel sounds and no masses or organomegaly BACK: Back symmetric, no curvature., No CVA tenderness EXTREMITIES:less then 2 second capillary refill, no joint deformities, effusion, or inflammation, no edema, no skin discoloration, no clubbing, no cyanosis  NEURO: alert & oriented x 3 with fluent speech, no focal motor/sensory deficits, gait normal   LABORATORY DATA: CBC    Component Value Date/Time   WBC 8.1 11/08/2014 1130   RBC 4.31 11/08/2014 1130   HGB 13.0 11/08/2014 1130   HCT 40.0 11/08/2014 1130   PLT 299 11/08/2014 1130   MCV 92.8 11/08/2014  1130   MCH 30.2 11/08/2014 1130   MCHC 32.5 11/08/2014 1130   RDW 13.4 11/08/2014 1130   LYMPHSABS 2.8 11/08/2014 1130   MONOABS 0.8 11/08/2014 1130   EOSABS 0.1 11/08/2014 1130   BASOSABS 0.0 11/08/2014 1130      Chemistry      Component Value Date/Time   NA 138 11/08/2014 1130   K 4.3 11/08/2014 1130   CL 104 11/08/2014 1130   CO2 27 11/08/2014 1130   BUN 15 11/08/2014 1130   CREATININE 0.93 11/08/2014 1130      Component Value Date/Time   CALCIUM 8.9 11/08/2014 1130   ALKPHOS 48 11/08/2014 1130   AST 21 11/08/2014 1130   ALT 16 11/08/2014 1130   BILITOT 0.8 11/08/2014 1130       ASSESSMENT AND PLAN:  Stage IA invasive breast cancer L breast, ER positive, PR positive, HER-2/neu over amplified; Arimidex started 06/2013 Hot flashes secondary to above Normal Bone Density on DEXA 08/02/2013   She is overall doing well. She is compliant with her Arimidex therapy. She states she takes calcium and vitamin D daily. She will be due again for screening mammogram in October and we will order this today. We will continue with three-month follow-up appointments and we will space these out after her next visit.  You will be due for another DEXA scan in April of next year. She was advised to call with any interim concerns. She is not sure if she needs medication refills and I have advised her to let us know if she  does.  THERAPY PLAN:  NCCN guidelines recommends the following surveillance for invasive breast cancer:  A. History and Physical exam every 4-6 months for 5 years and then every 12 months.  B. Mammography every 12 months  C. Women on Tamoxifen: annual gynecologic assessment every 12 months if uterus is present.  D. Women on aromatase inhibitor or who experience ovarian failure secondary to treatment should have monitoring of bone health with a bone mineral density determination at baseline and periodically thereafter.  E. Assess and encourage adherence to adjuvant endocrine  therapy.  F. Evidence suggests that active lifestyle and achieving and maintaining an ideal body weight (20-25 BMI) may lead to optimal breast cancer outcomes.  All questions were answered. The patient knows to call the clinic with any problems, questions or concerns. We can certainly see the patient much sooner if necessary.   This document serves as a record of services personally performed by Ancil Linsey MD, it was created on her behalf by Janace Hoard a trained medical scribe. The creation of this record is based upon the scribes personal observations and the providers statement to them. This document as been checked and approved by the attending provider.  I have reviewed the above documentation for accuracy and completeness and I agree with the above.  This note is electronically signed.  Kelby Fam. Whitney Muse, MD

## 2014-11-08 NOTE — Patient Instructions (Addendum)
[place] at [facility] Discharge Instructions  RECOMMENDATIONS MADE BY THE CONSULTANT AND ANY TEST RESULTS WILL BE SENT TO YOUR REFERRING PHYSICIAN.  Exam and discussion today with Dr. Whitney Muse. Mammogram as scheduled. Return in 3 months for office visit with Kirby Crigler, PA-C.  Thank you for choosing [place] at [facility] to provide your oncology and hematology care.  To afford each patient quality time with our provider, please arrive at least 15 minutes before your scheduled appointment time.    You need to re-schedule your appointment should you arrive 10 or more minutes late.  We strive to give you quality time with our providers, and arriving late affects you and other patients whose appointments are after yours.  Also, if you no show three or more times for appointments you may be dismissed from the clinic at the providers discretion.     Again, thank you for choosing [facility].  Our hope is that these requests will decrease the amount of time that you wait before being seen by our physicians.       _____________________________________________________________  Should you have questions after your visit to [facility], please contact our office at [phone] between the hours of 8:30 a.m. and 4:30 p.m.  Voicemails left after 4:30 p.m. will not be returned until the following business day.  For prescription refill requests, have your pharmacy contact our office.

## 2014-11-08 NOTE — Progress Notes (Signed)
Vickii Penna presented for Portacath access and flush.  Portacath located right chest wall accessed with  H 20 needle.  Good blood return present. Portacath flushed with 91ml NS and 500U/58ml Heparin and needle removed intact.  Procedure tolerated well and without incident.

## 2014-11-08 NOTE — Progress Notes (Signed)
Please see doctors encounter for more information 

## 2014-11-09 ENCOUNTER — Encounter: Payer: Self-pay | Admitting: Gastroenterology

## 2014-11-09 ENCOUNTER — Ambulatory Visit (INDEPENDENT_AMBULATORY_CARE_PROVIDER_SITE_OTHER): Payer: Medicare Other | Admitting: Gastroenterology

## 2014-11-09 VITALS — BP 97/61 | HR 101 | Temp 97.8°F | Ht 62.0 in | Wt 145.0 lb

## 2014-11-09 DIAGNOSIS — K219 Gastro-esophageal reflux disease without esophagitis: Secondary | ICD-10-CM | POA: Diagnosis not present

## 2014-11-09 NOTE — Progress Notes (Signed)
Primary Care Physician: [name], MD  Primary Gastroenterologist:  Garfield Cornea, MD   Chief Complaint  Patient presents with  . Gastrophageal Reflux    HPI: Stacie Huang is a 74 y.o. female here for follow-up of GERD. Last seen 6 months ago. Maintain on Protonix 40 mg twice a day. Doing well. Good control of her symptoms. No vomiting. Her appetite is good. Denies abdominal pain, constipation, diarrhea, melena, rectal bleeding. Last colonoscopy in 2003 which was incomplete due to noncompliant colon. Evaluation complete up to 40 cm only. An air-contrast barium enema done to complete evaluation, unremarkable. EGD at the same time showed a tiny AVM in the antrum, empiric dilation of esophagus performed for history of dysphagia.  Patient tells me today that she is not interested in pursuing colonoscopy.   Current Outpatient Prescriptions  Medication Sig Dispense Refill  . acetaminophen (TYLENOL) 500 MG tablet Take 1,000 mg by mouth daily.    Marland Kitchen anastrozole (ARIMIDEX) 1 MG tablet Take 1 tablet (1 mg total) by mouth daily. 30 tablet 5  . atenolol (TENORMIN) 100 MG tablet Take 50 mg by mouth daily before breakfast.     . calcium carbonate (OS-CAL) 600 MG TABS tablet Take 600 mg by mouth daily.    . cetirizine (ZYRTEC) 10 MG tablet Take 10 mg by mouth every morning.     . Cholecalciferol (VITAMIN D) 400 UNITS capsule Take 400 Units by mouth every morning.     . diltiazem (CARDIZEM CD) 120 MG 24 hr capsule Take 1 tablet daily 90 capsule 3  . fluticasone (FLONASE) 50 MCG/ACT nasal spray Place 2 sprays into the nose at bedtime.     Marland Kitchen LORazepam (ATIVAN) 1 MG tablet Take 1 mg by mouth daily as needed for anxiety (AND/OR FOR SLEEP).     Marland Kitchen losartan (COZAAR) 50 MG tablet Take 50 mg by mouth every morning.     . metoCLOPramide (REGLAN) 10 MG tablet Take 1 tablet (10 mg total) by mouth 4 (four) times daily -  before meals and at bedtime. 120 tablet 5  . pantoprazole (PROTONIX) 40 MG  tablet TAKE (1) TABLET BY MOUTH TWICE DAILY. 60 tablet 5  . psyllium (METAMUCIL) 58.6 % powder Take 1 packet by mouth daily.     . simvastatin (ZOCOR) 10 MG tablet Take 10 mg by mouth at bedtime.      . triamcinolone cream (KENALOG) 0.1 % Apply 1 application topically at bedtime.    Marland Kitchen warfarin (COUMADIN) 2.5 MG tablet Takes 2.5 mg (1 tablet) on Tuesday and Friday takes 1.25 mg (0.5 tablet) all other days 30 tablet 6   No current facility-administered medications for this visit.    Allergies as of 11/09/2014 - Review Complete 11/08/2014  Allergen Reaction Noted  . Iohexol Hives 03/17/2008  . Sulfa antibiotics Rash 05/26/2011   Past Medical History  Diagnosis Date  . Hypertension   . Paroxysmal atrial fibrillation   . Chronic anticoagulation   . Hyperlipidemia   . GERD (gastroesophageal reflux disease)   . Cancer   . Breast cancer    Past Surgical History  Procedure Laterality Date  . Abdominal hysterectomy    . Cholecystectomy    . Partial mastectomy with axillary sentinel lymph node biopsy Left 02/23/2013    Procedure: LEFT PARTIAL MASTECTOMY WITH AXILLARY SIMPLE NODE BIOPSY, LEFT BREAST WIDE EXCISION;  Surgeon: Scherry Ran, MD;  Location: [facility];  Service: General;  Laterality: Left;  . Axillary lymph  node dissection Left 02/23/2013    Procedure: LEFT AXILLARY LYMPH NODE DISSECTION;  Surgeon: Scherry Ran, MD;  Location: [facility];  Service: General;  Laterality: Left;  . Portacath placement Right 04/07/2013  . Portacath placement Right 04/07/2013    Procedure: INSERTION PORT-A-CATH;  Surgeon: Scherry Ran, MD;  Location: [facility];  Service: General;  Laterality: Right;  . Esophagogastroduodenoscopy  03/10/2002    HYW:VPXTGG esophagus/small HH/Tiny AVM in antrum, otherwise normal stomach and D1 and D2/Status post passage of the [facility] dilator  . Colonoscopy  03/10/2002    RMR: Incomplete colonoscopy (sigmoidoscopy)/Normal rectum/Normal colon to 40 cm.    Family  History  Problem Relation Age of Onset  . Heart disease Mother     Also 66 of 9 siblings  . Heart attack Father   . Cancer      2 siblings  . Leukemia     History   Social History  . Marital Status: Legally Separated    Spouse Name: N/A  . Number of Children:  2  . Years of Education: N/A   Occupational History  . Retired from [employer]  . Smoking status: Never Smoker   . Smokeless tobacco: Never Used  . Alcohol Use: No  . Drug Use: No  . Sexual Activity: Yes    Birth Control/ Protection: Surgical   Other Topics Concern  . None   Social History Narrative   2 adult children    ROS:  General: Negative for anorexia, weight loss, fever, chills, fatigue, weakness. ENT: Negative for hoarseness, difficulty swallowing , nasal congestion. CV: Negative for chest pain, angina, palpitations, dyspnea on exertion, peripheral edema.  Respiratory: Negative for dyspnea at rest, dyspnea on exertion, cough, sputum, wheezing.  GI: See history of present illness. GU:  Negative for dysuria, hematuria, urinary incontinence, urinary frequency, nocturnal urination.  Endo: Negative for unusual weight change.    Physical Examination:   BP 97/61 mmHg  Pulse 101  Temp(Src) 97.8 F (36.6 C) (Oral)  Ht 5\' 2"  (1.575 m)  Wt 145 lb (65.772 kg)  BMI 26.51 kg/m2  General: Well-nourished, well-developed in no acute distress.  Eyes: No icterus. Mouth: Oropharyngeal mucosa moist and pink , no lesions erythema or exudate. Lungs: Clear to auscultation bilaterally.  Heart: Regular rate and rhythm, no murmurs rubs or gallops.  Abdomen: Bowel sounds are normal, nontender, nondistended, no hepatosplenomegaly or masses, no abdominal bruits or hernia , no rebound or guarding.   Extremities: No lower extremity edema. No clubbing or deformities. Neuro: Alert and oriented x 4   Skin: Warm and dry, no jaundice.   Psych: Alert and cooperative, normal mood and  affect.  Labs:  Lab Results  Component Value Date   WBC 8.1 11/08/2014   HGB 13.0 11/08/2014   HCT 40.0 11/08/2014   MCV 92.8 11/08/2014   PLT 299 11/08/2014   Lab Results  Component Value Date   CREATININE 0.93 11/08/2014   BUN 15 11/08/2014   NA 138 11/08/2014   K 4.3 11/08/2014   CL 104 11/08/2014   CO2 27 11/08/2014   Lab Results  Component Value Date   ALT 16 11/08/2014   AST 21 11/08/2014   ALKPHOS 48 11/08/2014   BILITOT 0.8 11/08/2014    Imaging Studies: No results found.

## 2014-11-09 NOTE — Assessment & Plan Note (Signed)
Doing very well. Would like to see if she can decrease pantoprazole to once daily however if symptoms are recurrent she can go back to twice daily dosing. She will call us in a few weeks and let us know how it goes. Otherwise we will see her back in one year or sooner if needed.

## 2014-11-09 NOTE — Patient Instructions (Signed)
1. Try cutting back on protonix to once daily before breakfast. If you have recurrent abdominal pain, heartburn on once daily dosing, you may increase back to twice daily. 2. Call in four weeks and let us know if you were able to tolerate protonix once daily. 3. Return to the office in one year or sooner if needed.

## 2014-11-13 ENCOUNTER — Ambulatory Visit (INDEPENDENT_AMBULATORY_CARE_PROVIDER_SITE_OTHER): Payer: Medicare Other | Admitting: *Deleted

## 2014-11-13 DIAGNOSIS — Z7901 Long term (current) use of anticoagulants: Secondary | ICD-10-CM | POA: Diagnosis not present

## 2014-11-13 DIAGNOSIS — I4891 Unspecified atrial fibrillation: Secondary | ICD-10-CM | POA: Diagnosis not present

## 2014-11-13 DIAGNOSIS — Z5181 Encounter for therapeutic drug level monitoring: Secondary | ICD-10-CM | POA: Diagnosis not present

## 2014-11-13 LAB — POCT INR: INR: 3.1

## 2014-11-13 NOTE — Progress Notes (Signed)
cc'ed to pcp °

## 2014-12-25 ENCOUNTER — Ambulatory Visit (INDEPENDENT_AMBULATORY_CARE_PROVIDER_SITE_OTHER): Payer: Medicare Other | Admitting: Pharmacist

## 2014-12-25 DIAGNOSIS — I4891 Unspecified atrial fibrillation: Secondary | ICD-10-CM | POA: Diagnosis not present

## 2014-12-25 DIAGNOSIS — Z5181 Encounter for therapeutic drug level monitoring: Secondary | ICD-10-CM

## 2014-12-25 DIAGNOSIS — Z7901 Long term (current) use of anticoagulants: Secondary | ICD-10-CM | POA: Diagnosis not present

## 2014-12-25 LAB — POCT INR: INR: 2.5

## 2014-12-26 ENCOUNTER — Other Ambulatory Visit (HOSPITAL_COMMUNITY): Payer: Self-pay | Admitting: Oncology

## 2015-01-03 ENCOUNTER — Encounter (HOSPITAL_COMMUNITY): Payer: Medicare Other | Attending: Hematology and Oncology

## 2015-01-03 ENCOUNTER — Other Ambulatory Visit (HOSPITAL_COMMUNITY): Payer: Self-pay | Admitting: Oncology

## 2015-01-03 VITALS — BP 110/65 | HR 92 | Temp 98.6°F | Resp 16

## 2015-01-03 DIAGNOSIS — Z452 Encounter for adjustment and management of vascular access device: Secondary | ICD-10-CM

## 2015-01-03 DIAGNOSIS — Z95828 Presence of other vascular implants and grafts: Secondary | ICD-10-CM

## 2015-01-03 DIAGNOSIS — C50212 Malignant neoplasm of upper-inner quadrant of left female breast: Secondary | ICD-10-CM | POA: Insufficient documentation

## 2015-01-03 MED ORDER — HEPARIN SOD (PORK) LOCK FLUSH 100 UNIT/ML IV SOLN
INTRAVENOUS | Status: AC
  Filled 2015-01-03: qty 5

## 2015-01-03 MED ORDER — HEPARIN SOD (PORK) LOCK FLUSH 100 UNIT/ML IV SOLN
500.0000 [IU] | Freq: Once | INTRAVENOUS | Status: AC
  Administered 2015-01-03: 500 [IU] via INTRAVENOUS

## 2015-01-03 MED ORDER — SODIUM CHLORIDE 0.9 % IJ SOLN
10.0000 mL | Freq: Once | INTRAMUSCULAR | Status: AC
  Administered 2015-01-03: 10 mL via INTRAVENOUS

## 2015-01-03 NOTE — Progress Notes (Signed)
Kriste A Eunice presented for Portacath access and flush. Proper placement of portacath confirmed by CXR. Portacath located right chest wall accessed with  H 20 needle. Good blood return present. Portacath flushed with 20ml NS and 500U/5ml Heparin and needle removed intact. Procedure without incident. Patient tolerated procedure well.   

## 2015-01-03 NOTE — Patient Instructions (Signed)
[facility] at [facility] Discharge Instructions  RECOMMENDATIONS MADE BY THE CONSULTANT AND ANY TEST RESULTS WILL BE SENT TO YOUR REFERRING PHYSICIAN.  Port flush today as ordered. Return as scheduled.  Thank you for choosing [facility] at [facility] to provide your oncology and hematology care.  To afford each patient quality time with our provider, please arrive at least 15 minutes before your scheduled appointment time.    You need to re-schedule your appointment should you arrive 10 or more minutes late.  We strive to give you quality time with our providers, and arriving late affects you and other patients whose appointments are after yours.  Also, if you no show three or more times for appointments you may be dismissed from the clinic at the providers discretion.     Again, thank you for choosing [facility].  Our hope is that these requests will decrease the amount of time that you wait before being seen by our physicians.       _____________________________________________________________  Should you have questions after your visit to [facility], please contact our office at [phone] between the hours of 8:30 a.m. and 4:30 p.m.  Voicemails left after 4:30 p.m. will not be returned until the following business day.  For prescription refill requests, have your pharmacy contact our office.    

## 2015-01-10 ENCOUNTER — Telehealth: Payer: Self-pay | Admitting: Internal Medicine

## 2015-01-10 MED ORDER — PANTOPRAZOLE SODIUM 40 MG PO TBEC: DELAYED_RELEASE_TABLET | ORAL | Status: DC

## 2015-01-10 NOTE — Addendum Note (Signed)
Addended by: Orvil Feil on: 01/10/2015 04:27 PM   Modules accepted: Orders

## 2015-01-10 NOTE — Telephone Encounter (Signed)
Completed.

## 2015-01-10 NOTE — Telephone Encounter (Signed)
Pt is on pantoprazole. Routing to the refill box.

## 2015-01-10 NOTE — Telephone Encounter (Signed)
Pt called to let us know that she was told to call back when it was time for a refill. She said she was to take 1 pill a day, but she wants to take 2 a day. Doesn't know the name of prescription. "pana ?" She uses [facility]. [phone]

## 2015-02-01 ENCOUNTER — Telehealth: Payer: Self-pay | Admitting: Pharmacist

## 2015-02-01 NOTE — Telephone Encounter (Signed)
Spoke with pt.  She does not think she has taken a Zpak with her Coumadin in the past.  Her INR ~6 weeks ago was 2.5.  She is due for recheck on Monday.  Will continue current dose and have her eat an extra serving of greens this weekend while on the abx.  Plan to keep appt on Monday for recheck.

## 2015-02-01 NOTE — Telephone Encounter (Signed)
Patient is taking a Z-Pack and wants to know how she needs to adjust her Coumadin. / tg

## 2015-02-05 ENCOUNTER — Ambulatory Visit (INDEPENDENT_AMBULATORY_CARE_PROVIDER_SITE_OTHER): Payer: Medicare Other | Admitting: *Deleted

## 2015-02-05 DIAGNOSIS — Z5181 Encounter for therapeutic drug level monitoring: Secondary | ICD-10-CM | POA: Diagnosis not present

## 2015-02-05 DIAGNOSIS — I4891 Unspecified atrial fibrillation: Secondary | ICD-10-CM | POA: Diagnosis not present

## 2015-02-05 DIAGNOSIS — Z7901 Long term (current) use of anticoagulants: Secondary | ICD-10-CM

## 2015-02-05 LAB — POCT INR: INR: 1.9

## 2015-02-06 ENCOUNTER — Other Ambulatory Visit (HOSPITAL_COMMUNITY): Payer: Self-pay | Admitting: Hematology & Oncology

## 2015-02-06 DIAGNOSIS — C50212 Malignant neoplasm of upper-inner quadrant of left female breast: Secondary | ICD-10-CM

## 2015-02-12 ENCOUNTER — Ambulatory Visit (HOSPITAL_COMMUNITY): Payer: Medicare Other

## 2015-02-13 NOTE — Progress Notes (Signed)
Stacie Bellow, MD [place] [address]  Breast cancer of upper-inner quadrant of left female breast [facility]) - Plan: DG Bone Density, CBC with Differential, Comprehensive metabolic panel, CBC with Differential, Comprehensive metabolic panel, Vitamin D 25 hydroxy, CBC with Differential, Comprehensive metabolic panel, heparin lock flush 100 unit/mL, sodium chloride 0.9 % injection 10 mL  Aromatase inhibitor use - Plan: DG Bone Density  Preventative health care - Plan: DG Bone Density  Need for prophylactic vaccination and inoculation against influenza - Plan: Influenza vac split quadrivalent PF (FLUARIX) injection 0.5 mL  CURRENT THERAPY: Anastrozole 1 mg daily beginning on 06/28/2013  INTERVAL HISTORY: Stacie Huang 74 y.o. 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 Now, S/P 12 months worth of Herceptin therapy every 3 weeks. Currently on Anastrozole started on 06/28/2013.     Breast cancer of upper-inner quadrant of left female breast ([place])   02/04/2013 Initial Diagnosis Breast cancer of upper-inner quadrant of left female breast   02/23/2013 Surgery left lumpectomy (UIQ), sentinel node biopsy--0.7cm, Node negative, ER+, PR+, HER-2/neu over expressed.   04/08/2013 - 04/14/2013 Chemotherapy Paclitaxel/Herceptin weekly x 12. Intolerance to Paclitaxel and only receiving 2 cycles   05/04/2013 - 04/05/2014 Chemotherapy Herceptin every 21 days    06/28/2013 -  Chemotherapy Anastrazole started   08/02/2013 Imaging Bone density- normal   04/06/2014 Imaging MUGA- Left ventricular ejection fraction equals 53%.     I personally reviewed and went over laboratory results with the patient.  The results are noted within this dictation.  She is doing very well.  She notes AM hot flashes, but "they aren't that bad."  She  denies any interference with ADLs.  She admits to compliance of her aromatase inhibitor.  She denies any arthralgias and myalgias.   Past Medical History  Diagnosis Date  . Hypertension   . Paroxysmal atrial fibrillation (HCC)   . Chronic anticoagulation   . Hyperlipidemia   . GERD (gastroesophageal reflux disease)   . Cancer ([place])   . Breast cancer ([place])     has Hyperlipidemia; Chronic anticoagulation; Atrial fibrillation ([place]); Hypertension; Vitamin D deficiency; Breast cancer of upper-inner quadrant of left female breast ([place]); Paroxysmal atrial fibrillation ([place]); Encounter for therapeutic drug monitoring; GERD (gastroesophageal reflux disease); Bloating; and Colon cancer screening on her problem list.     is allergic to iohexol and sulfa antibiotics.  Current Outpatient Prescriptions on File Prior to Visit  Medication Sig Dispense Refill  . acetaminophen (TYLENOL) 500 MG tablet Take 1,000 mg by mouth daily.    Marland Kitchen anastrozole (ARIMIDEX) 1 MG tablet TAKE ONE TABLET BY MOUTH DAILY. 30 tablet 5  . atenolol (TENORMIN) 100 MG tablet Take 50 mg by mouth daily before breakfast.     . calcium carbonate (OS-CAL) 600 MG TABS tablet Take 600 mg by mouth daily.    . cetirizine (ZYRTEC) 10 MG tablet Take 10 mg by mouth every morning.     . Cholecalciferol (VITAMIN D) 400 UNITS capsule Take 400 Units by mouth every morning.     . diltiazem (CARDIZEM CD) 120 MG 24 hr capsule Take 1 tablet daily 90 capsule 3  . fluticasone (FLONASE) 50 MCG/ACT nasal spray Place 2 sprays into the nose at bedtime.     Marland Kitchen LORazepam (ATIVAN) 1 MG tablet Take 1 mg by mouth  daily as needed for anxiety (AND/OR FOR SLEEP).     Marland Kitchen losartan (COZAAR) 50 MG tablet Take 50 mg by mouth every morning.     . metoCLOPramide (REGLAN) 10 MG tablet Take 1 tablet (10 mg total) by mouth 4 (four) times daily -  before meals and at bedtime. 120 tablet 5  . pantoprazole (PROTONIX) 40 MG tablet TAKE (1) TABLET BY MOUTH TWICE DAILY. 60  tablet 5  . psyllium (METAMUCIL) 58.6 % powder Take 1 packet by mouth daily.     . simvastatin (ZOCOR) 10 MG tablet Take 10 mg by mouth at bedtime.      . triamcinolone cream (KENALOG) 0.1 % Apply 1 application topically at bedtime.    Marland Kitchen warfarin (COUMADIN) 2.5 MG tablet Takes 2.5 mg (1 tablet) on Tuesday and Friday takes 1.25 mg (0.5 tablet) all other days 30 tablet 6   No current facility-administered medications on file prior to visit.    Past Surgical History  Procedure Laterality Date  . Abdominal hysterectomy    . Cholecystectomy    . Partial mastectomy with axillary sentinel lymph node biopsy Left 02/23/2013    Procedure: LEFT PARTIAL MASTECTOMY WITH AXILLARY SIMPLE NODE BIOPSY, LEFT BREAST WIDE EXCISION;  Surgeon: Stacie Ran, MD;  Location: [facility];  Service: General;  Laterality: Left;  . Axillary lymph node dissection Left 02/23/2013    Procedure: LEFT AXILLARY LYMPH NODE DISSECTION;  Surgeon: Stacie Ran, MD;  Location: [facility];  Service: General;  Laterality: Left;  . Portacath placement Right 04/07/2013  . Portacath placement Right 04/07/2013    Procedure: INSERTION PORT-A-CATH;  Surgeon: Stacie Ran, MD;  Location: [facility];  Service: General;  Laterality: Right;  . Esophagogastroduodenoscopy  03/10/2002    FHL:KTGYBW esophagus/small HH/Tiny AVM in antrum, otherwise normal stomach and D1 and D2/Status post passage of the [facility] dilator  . Colonoscopy  03/10/2002    RMR: Incomplete colonoscopy (sigmoidoscopy)/Normal rectum/Normal colon to 40 cm.     Denies any headaches, dizziness, double vision, fevers, chills, night sweats, nausea, vomiting, diarrhea, constipation, chest pain, heart palpitations, shortness of breath, blood in stool, black tarry stool, urinary pain, urinary burning, urinary frequency, hematuria.   PHYSICAL EXAMINATION  ECOG PERFORMANCE STATUS: 0 - Asymptomatic  Filed Vitals:   02/14/15 1308  BP: 97/64  Pulse: 104  Temp: 98.3 F  (36.8 C)  Resp: 18    GENERAL:alert, no distress, well nourished, well developed, comfortable, cooperative, smiling and unaccompanied SKIN: skin color, texture, turgor are normal, no rashes or significant lesions HEAD: Normocephalic, No masses, lesions, tenderness or abnormalities EYES: normal, PERRLA, EOMI, Conjunctiva are pink and non-injected EARS: External ears normal OROPHARYNX:lips, buccal mucosa, and tongue normal and mucous membranes are moist  NECK: supple, no adenopathy, thyroid normal size, non-tender, without nodularity, trachea midline LYMPH:  no palpable lymphadenopathy, no hepatosplenomegaly BREAST:right breast normal without mass, skin or nipple changes or axillary nodes, left breast normal without mass, skin or nipple changes or axillary nodes LUNGS: clear to auscultation and percussion HEART: regular rate & rhythm, no murmurs, no gallops, S1 normal and S2 normal ABDOMEN:abdomen soft, non-tender, normal bowel sounds and no masses or organomegaly BACK: Back symmetric, no curvature., No CVA tenderness EXTREMITIES:less then 2 second capillary refill, no joint deformities, effusion, or inflammation, no edema, no skin discoloration, no clubbing, no cyanosis  NEURO: alert & oriented x 3 with fluent speech, no focal motor/sensory deficits, gait normal    LABORATORY DATA: CBC    Component Value  Date/Time   WBC 8.1 11/08/2014 1130   RBC 4.31 11/08/2014 1130   HGB 13.0 11/08/2014 1130   HCT 40.0 11/08/2014 1130   PLT 299 11/08/2014 1130   MCV 92.8 11/08/2014 1130   MCH 30.2 11/08/2014 1130   MCHC 32.5 11/08/2014 1130   RDW 13.4 11/08/2014 1130   LYMPHSABS 2.8 11/08/2014 1130   MONOABS 0.8 11/08/2014 1130   EOSABS 0.1 11/08/2014 1130   BASOSABS 0.0 11/08/2014 1130      Chemistry      Component Value Date/Time   NA 138 11/08/2014 1130   K 4.3 11/08/2014 1130   CL 104 11/08/2014 1130   CO2 27 11/08/2014 1130   BUN 15 11/08/2014 1130   CREATININE 0.93 11/08/2014  1130      Component Value Date/Time   CALCIUM 8.9 11/08/2014 1130   ALKPHOS 48 11/08/2014 1130   AST 21 11/08/2014 1130   ALT 16 11/08/2014 1130   BILITOT 0.8 11/08/2014 1130        PENDING LABS:   RADIOGRAPHIC STUDIES:  No results found.   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 S/P 12 months worth of Herceptin therapy every 3 weeks. Currently on Anastrozole beginning on 06/28/2013.     Oncology history is up-to-date  Bone density exam in April 2015 was normal.  She is due for her next bone density in April 2017.  Order is placed.  Labs today: CBC diff, CMET, Vit D level.    Compliance with Anastrozole encouraged.   Next mammogram is scheduled for later this month.    Labs in 4 months: CBC diff, CMET.    Return in 4 months for follow-up.    Influenza vaccine is given today.    THERAPY PLAN:  NCCN guidelines recommends the following surveillance for invasive breast cancer:  A. History and Physical exam every 4-6 months for 5 years and then every 12 months.  B. Mammography every 12 months  C. Women on Tamoxifen: annual gynecologic assessment every 12 months if uterus is present.  D. Women on aromatase inhibitor or who experience ovarian failure secondary to treatment should have monitoring of bone health with a bone mineral density determination at baseline and periodically thereafter.  E. Assess and encourage adherence to adjuvant endocrine therapy.  F. Evidence suggests that active lifestyle and achieving and maintaining an ideal body weight (20-25 BMI) may lead to optimal breast cancer outcomes.   All questions were answered. The patient knows to call the clinic with any problems, questions or concerns. We can certainly see the patient  much sooner if necessary.  Patient and plan discussed with Dr. Ancil Linsey and she is in agreement with the aforementioned.   This note is electronically signed by: Robynn Pane, PA-C 02/14/2015 2:16 PM

## 2015-02-13 NOTE — Assessment & Plan Note (Addendum)
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 S/P 12 months worth of Herceptin therapy every 3 weeks. Currently on Anastrozole beginning on 06/28/2013.     Oncology history is up-to-date  Bone density exam in April 2015 was normal.  She is due for her next bone density in April 2017.  Order is placed.  Labs today: CBC diff, CMET, Vit D level.    Compliance with Anastrozole encouraged.   Next mammogram is scheduled for later this month.    Labs in 4 months: CBC diff, CMET.    Return in 4 months for follow-up.    Influenza vaccine is given today.

## 2015-02-14 ENCOUNTER — Encounter (HOSPITAL_COMMUNITY): Payer: Medicare Other

## 2015-02-14 ENCOUNTER — Encounter (HOSPITAL_COMMUNITY): Payer: Self-pay | Admitting: Oncology

## 2015-02-14 ENCOUNTER — Encounter (HOSPITAL_COMMUNITY): Payer: Medicare Other | Attending: Hematology and Oncology | Admitting: Oncology

## 2015-02-14 ENCOUNTER — Other Ambulatory Visit (HOSPITAL_COMMUNITY): Payer: Self-pay | Admitting: Oncology

## 2015-02-14 VITALS — BP 97/64 | HR 104 | Temp 98.3°F | Resp 18 | Wt 145.0 lb

## 2015-02-14 DIAGNOSIS — Z23 Encounter for immunization: Secondary | ICD-10-CM | POA: Diagnosis not present

## 2015-02-14 DIAGNOSIS — Z0000 Encounter for general adult medical examination without abnormal findings: Secondary | ICD-10-CM

## 2015-02-14 DIAGNOSIS — C50212 Malignant neoplasm of upper-inner quadrant of left female breast: Secondary | ICD-10-CM | POA: Diagnosis present

## 2015-02-14 DIAGNOSIS — Z170 Estrogen receptor positive status [ER+]: Secondary | ICD-10-CM | POA: Diagnosis not present

## 2015-02-14 DIAGNOSIS — Z79811 Long term (current) use of aromatase inhibitors: Secondary | ICD-10-CM

## 2015-02-14 LAB — COMPREHENSIVE METABOLIC PANEL
ALBUMIN: 3.9 g/dL (ref 3.5–5.0)
ALT: 12 U/L — ABNORMAL LOW (ref 14–54)
ANION GAP: 10 (ref 5–15)
AST: 19 U/L (ref 15–41)
Alkaline Phosphatase: 50 U/L (ref 38–126)
BUN: 18 mg/dL (ref 6–20)
CHLORIDE: 104 mmol/L (ref 101–111)
CO2: 25 mmol/L (ref 22–32)
Calcium: 9.2 mg/dL (ref 8.9–10.3)
Creatinine, Ser: 1.06 mg/dL — ABNORMAL HIGH (ref 0.44–1.00)
GFR calc non Af Amer: 50 mL/min — ABNORMAL LOW (ref >60)
GFR, EST AFRICAN AMERICAN: 58 mL/min — AB (ref >60)
GLUCOSE: 92 mg/dL (ref 65–99)
POTASSIUM: 4.1 mmol/L (ref 3.5–5.1)
SODIUM: 139 mmol/L (ref 135–145)
Total Bilirubin: 0.6 mg/dL (ref 0.3–1.2)
Total Protein: 6.8 g/dL (ref 6.5–8.1)

## 2015-02-14 LAB — CBC WITH DIFFERENTIAL/PLATELET
BASOS PCT: 1 %
Basophils Absolute: 0.1 10*3/uL (ref 0.0–0.1)
EOS ABS: 0 10*3/uL (ref 0.0–0.7)
Eosinophils Relative: 1 %
HCT: 38.5 % (ref 36.0–46.0)
HEMOGLOBIN: 12.2 g/dL (ref 12.0–15.0)
Lymphocytes Relative: 36 %
Lymphs Abs: 2.8 10*3/uL (ref 0.7–4.0)
MCH: 29.7 pg (ref 26.0–34.0)
MCHC: 31.7 g/dL (ref 30.0–36.0)
MCV: 93.7 fL (ref 78.0–100.0)
MONO ABS: 0.6 10*3/uL (ref 0.1–1.0)
MONOS PCT: 8 %
NEUTROS PCT: 54 %
Neutro Abs: 4.1 10*3/uL (ref 1.7–7.7)
PLATELETS: 350 10*3/uL (ref 150–400)
RBC: 4.11 MIL/uL (ref 3.87–5.11)
RDW: 14 % (ref 11.5–15.5)
WBC: 7.6 10*3/uL (ref 4.0–10.5)

## 2015-02-14 MED ORDER — SODIUM CHLORIDE 0.9 % IJ SOLN
10.0000 mL | INTRAMUSCULAR | Status: DC | PRN
  Administered 2015-02-14: 10 mL via INTRAVENOUS
  Filled 2015-02-14: qty 10

## 2015-02-14 MED ORDER — HEPARIN SOD (PORK) LOCK FLUSH 100 UNIT/ML IV SOLN
500.0000 [IU] | Freq: Once | INTRAVENOUS | Status: AC
  Administered 2015-02-14: 500 [IU] via INTRAVENOUS
  Filled 2015-02-14: qty 5

## 2015-02-14 MED ORDER — INFLUENZA VAC SPLIT QUAD 0.5 ML IM SUSY
0.5000 mL | PREFILLED_SYRINGE | Freq: Once | INTRAMUSCULAR | Status: AC
  Administered 2015-02-14: 0.5 mL via INTRAMUSCULAR
  Filled 2015-02-14: qty 0.5

## 2015-02-14 NOTE — Progress Notes (Signed)
Stacie Huang presents today for injection per the provider's orders.  Flu vaccine administration without incident; see MAR for injection details.  Patient tolerated procedure well and without incident.  No questions or complaints noted at this time. Stacie Huang presented for Portacath access and flush.    Portacath located rt chest wall accessed with  H 20 needle.  Good blood return present. Portacath flushed with 13ml NS and 500U/72ml Heparin and needle removed intact.  Procedure tolerated well and without incident.

## 2015-02-14 NOTE — Progress Notes (Signed)
Please see doctors encounter for more information 

## 2015-02-14 NOTE — Patient Instructions (Signed)
..  [place] at [facility] Discharge Instructions  RECOMMENDATIONS MADE BY THE CONSULTANT AND ANY TEST RESULTS WILL BE SENT TO YOUR REFERRING PHYSICIAN.  Bone density in April  Labs in 4 months    Thank you for choosing [place] at [facility] to provide your oncology and hematology care.  To afford each patient quality time with our provider, please arrive at least 15 minutes before your scheduled appointment time.    You need to re-schedule your appointment should you arrive 10 or more minutes late.  We strive to give you quality time with our providers, and arriving late affects you and other patients whose appointments are after yours.  Also, if you no show three or more times for appointments you may be dismissed from the clinic at the providers discretion.     Again, thank you for choosing [facility].  Our hope is that these requests will decrease the amount of time that you wait before being seen by our physicians.       _____________________________________________________________  Should you have questions after your visit to [facility], please contact our office at [phone] between the hours of 8:30 a.m. and 4:30 p.m.  Voicemails left after 4:30 p.m. will not be returned until the following business day.  For prescription refill requests, have your pharmacy contact our office.

## 2015-02-20 ENCOUNTER — Other Ambulatory Visit (HOSPITAL_COMMUNITY): Payer: Self-pay | Admitting: Oncology

## 2015-02-20 ENCOUNTER — Ambulatory Visit (HOSPITAL_COMMUNITY)
Admission: RE | Admit: 2015-02-20 | Discharge: 2015-02-20 | Disposition: A | Discharge status: Home / Self Care | Payer: Medicare Other | Source: Ambulatory Visit | Attending: Hematology & Oncology | Admitting: Hematology & Oncology

## 2015-02-20 DIAGNOSIS — C50212 Malignant neoplasm of upper-inner quadrant of left female breast: Secondary | ICD-10-CM

## 2015-02-20 DIAGNOSIS — Z780 Asymptomatic menopausal state: Secondary | ICD-10-CM

## 2015-02-20 DIAGNOSIS — Z853 Personal history of malignant neoplasm of breast: Secondary | ICD-10-CM | POA: Insufficient documentation

## 2015-02-20 DIAGNOSIS — Z79811 Long term (current) use of aromatase inhibitors: Secondary | ICD-10-CM

## 2015-03-12 ENCOUNTER — Ambulatory Visit (INDEPENDENT_AMBULATORY_CARE_PROVIDER_SITE_OTHER): Payer: Medicare Other | Admitting: *Deleted

## 2015-03-12 DIAGNOSIS — I4891 Unspecified atrial fibrillation: Secondary | ICD-10-CM | POA: Diagnosis not present

## 2015-03-12 DIAGNOSIS — Z7901 Long term (current) use of anticoagulants: Secondary | ICD-10-CM

## 2015-03-12 DIAGNOSIS — Z5181 Encounter for therapeutic drug level monitoring: Secondary | ICD-10-CM

## 2015-03-12 LAB — POCT INR: INR: 2.4

## 2015-03-20 ENCOUNTER — Encounter: Payer: Self-pay | Admitting: Adult Health

## 2015-03-20 ENCOUNTER — Ambulatory Visit (INDEPENDENT_AMBULATORY_CARE_PROVIDER_SITE_OTHER): Payer: Medicare Other | Admitting: Adult Health

## 2015-03-20 VITALS — BP 124/88 | HR 75 | Ht 62.0 in | Wt 144.0 lb

## 2015-03-20 DIAGNOSIS — I10 Essential (primary) hypertension: Secondary | ICD-10-CM

## 2015-03-20 DIAGNOSIS — I4821 Permanent atrial fibrillation: Secondary | ICD-10-CM

## 2015-03-20 DIAGNOSIS — I482 Chronic atrial fibrillation: Secondary | ICD-10-CM | POA: Diagnosis not present

## 2015-03-20 NOTE — Progress Notes (Signed)
Name: Stacie Huang    DOB: 07-20-1940  Age: 74 y.o.  MR#: [id number]       PCP:  [name], MD      Insurance: Payor: MEDICARE / Plan: MEDICARE PART A AND B / Product Type: *No Product type* /   CC:   No chief complaint on file.   VS Filed Vitals:   03/20/15 1404  BP: 124/88  Pulse: 75  Height: '5\' 2"'  (1.575 m)  Weight: 144 lb (65.318 kg)  SpO2: 97%    Weights Current Weight  03/20/15 144 lb (65.318 kg)  02/14/15 145 lb (65.772 kg)  11/09/14 145 lb (65.772 kg)    Blood Pressure  BP Readings from Last 3 Encounters:  03/20/15 124/88  02/14/15 97/64  01/03/15 110/65     Admit date:  (Not on file) Last encounter with RMR:  Visit date not found   Allergy Iohexol and Sulfa antibiotics  Current Outpatient Prescriptions  Medication Sig Dispense Refill  . acetaminophen (TYLENOL) 500 MG tablet Take 1,000 mg by mouth daily.    Marland Kitchen anastrozole (ARIMIDEX) 1 MG tablet TAKE ONE TABLET BY MOUTH DAILY. 30 tablet 5  . atenolol (TENORMIN) 100 MG tablet Take 50 mg by mouth daily before breakfast.     . calcium carbonate (OS-CAL) 600 MG TABS tablet Take 600 mg by mouth daily.    . cetirizine (ZYRTEC) 10 MG tablet Take 10 mg by mouth every morning.     . Cholecalciferol (VITAMIN D) 400 UNITS capsule Take 400 Units by mouth every morning.     . diltiazem (CARDIZEM CD) 120 MG 24 hr capsule Take 1 tablet daily 90 capsule 3  . fluticasone (FLONASE) 50 MCG/ACT nasal spray Place 2 sprays into the nose at bedtime.     Marland Kitchen LORazepam (ATIVAN) 1 MG tablet Take 1 mg by mouth daily as needed for anxiety (AND/OR FOR SLEEP).     Marland Kitchen losartan (COZAAR) 50 MG tablet Take 50 mg by mouth every morning.     . metoCLOPramide (REGLAN) 10 MG tablet Take 1 tablet (10 mg total) by mouth 4 (four) times daily -  before meals and at bedtime. 120 tablet 5  . pantoprazole (PROTONIX) 40 MG tablet TAKE (1) TABLET BY MOUTH TWICE DAILY. 60 tablet 5  . psyllium (METAMUCIL) 58.6 % powder Take 1 packet by mouth daily.     .  simvastatin (ZOCOR) 10 MG tablet Take 10 mg by mouth at bedtime.      . triamcinolone cream (KENALOG) 0.1 % Apply 1 application topically at bedtime.    Marland Kitchen warfarin (COUMADIN) 2.5 MG tablet Takes 2.5 mg (1 tablet) on Tuesday and Friday takes 1.25 mg (0.5 tablet) all other days 30 tablet 6   No current facility-administered medications for this visit.    Discontinued Meds:   There are no discontinued medications.  Patient Active Problem List   Diagnosis Date Noted  . Colon cancer screening 05/11/2014  . GERD (gastroesophageal reflux disease) 08/11/2013  . Bloating 08/11/2013  . Encounter for therapeutic drug monitoring 06/17/2013  . Paroxysmal atrial fibrillation ([place]) 03/17/2013  . Vitamin D deficiency 03/16/2013  . Breast cancer of upper-inner quadrant of left female breast ([facility]) 03/16/2013  . Atrial fibrillation ([place])   . Hypertension   . Chronic anticoagulation 11/11/2010  . Hyperlipidemia 02/26/2010    LABS    Component Value Date/Time   NA 139 02/14/2015 1310   NA 138 11/08/2014 1130   NA 137 07/05/2014 1219  K 4.1 02/14/2015 1310   K 4.3 11/08/2014 1130   K 4.5 07/05/2014 1219   CL 104 02/14/2015 1310   CL 104 11/08/2014 1130   CL 104 07/05/2014 1219   CO2 25 02/14/2015 1310   CO2 27 11/08/2014 1130   CO2 25 07/05/2014 1219   GLUCOSE 92 02/14/2015 1310   GLUCOSE 97 11/08/2014 1130   GLUCOSE 84 07/05/2014 1219   BUN 18 02/14/2015 1310   BUN 15 11/08/2014 1130   BUN 15 07/05/2014 1219   CREATININE 1.06* 02/14/2015 1310   CREATININE 0.93 11/08/2014 1130   CREATININE 0.99 07/05/2014 1219   CALCIUM 9.2 02/14/2015 1310   CALCIUM 8.9 11/08/2014 1130   CALCIUM 9.2 07/05/2014 1219   GFRNONAA 50* 02/14/2015 1310   GFRNONAA 59* 11/08/2014 1130   GFRNONAA 55* 07/05/2014 1219   GFRAA 58* 02/14/2015 1310   GFRAA >60 11/08/2014 1130   GFRAA 63* 07/05/2014 1219   CMP     Component Value Date/Time   NA 139 02/14/2015 1310   K 4.1 02/14/2015 1310   CL 104  02/14/2015 1310   CO2 25 02/14/2015 1310   GLUCOSE 92 02/14/2015 1310   BUN 18 02/14/2015 1310   CREATININE 1.06* 02/14/2015 1310   CALCIUM 9.2 02/14/2015 1310   PROT 6.8 02/14/2015 1310   ALBUMIN 3.9 02/14/2015 1310   AST 19 02/14/2015 1310   ALT 12* 02/14/2015 1310   ALKPHOS 50 02/14/2015 1310   BILITOT 0.6 02/14/2015 1310   GFRNONAA 50* 02/14/2015 1310   GFRAA 58* 02/14/2015 1310       Component Value Date/Time   WBC 7.6 02/14/2015 1310   WBC 8.1 11/08/2014 1130   WBC 8.4 07/05/2014 1219   HGB 12.2 02/14/2015 1310   HGB 13.0 11/08/2014 1130   HGB 12.9 07/05/2014 1219   HCT 38.5 02/14/2015 1310   HCT 40.0 11/08/2014 1130   HCT 40.2 07/05/2014 1219   MCV 93.7 02/14/2015 1310   MCV 92.8 11/08/2014 1130   MCV 93.7 07/05/2014 1219    Lipid Panel     Component Value Date/Time   CHOL 150 01/29/2010 1155   TRIG 135 01/29/2010 1155   HDL 63 01/29/2010 1155   LDLCALC 60 01/29/2010 1155    ABG No results found for: PHART, PCO2ART, PO2ART, HCO3, TCO2, ACIDBASEDEF, O2SAT   No results found for: TSH BNP (last 3 results) No results for input(s): BNP in the last 8760 hours.  ProBNP (last 3 results) No results for input(s): PROBNP in the last 8760 hours.  Cardiac Panel (last 3 results) No results for input(s): CKTOTAL, CKMB, TROPONINI, RELINDX in the last 72 hours.  Iron/TIBC/Ferritin/ %Sat No results found for: IRON, TIBC, FERRITIN, IRONPCTSAT   EKG Orders placed or performed in visit on 08/21/14  . EKG 12-Lead     Prior Assessment and Plan Problem List as of 03/20/2015      Cardiovascular and Mediastinum   Atrial fibrillation [facility])   Last Assessment & Plan 02/11/2013 Office Visit Written 02/11/2013  4:47 PM by Lendon Colonel, NP    Heart rate is well-controlled on current medication regimen. She remains on Coumadin. The question concerning holding Coumadin and with Lovenox bridge will be addressed once surgery date has been est. The patient will be given a  letter clearing her for surgery, as she has had a recent stress Myoview approximately 3 years ago which was normal and negative for ischemia. She is clear from a cardiac standpoint concerning surgery as a  low risk, with the exception of Coumadin dose adjusting and Lovenox bridging. Once date has been set, we will make further recommendations concerning bridging and or holding Coumadin prior to surgery.      Hypertension   Last Assessment & Plan 02/11/2013 Office Visit Written 02/11/2013  4:48 PM by Lendon Colonel, NP    Good control of blood pressure. We will not make any changes in her medication regimen at this time.      Paroxysmal atrial fibrillation [facility])     Digestive   GERD (gastroesophageal reflux disease)   Last Assessment & Plan 11/09/2014 Office Visit Written 11/09/2014  5:01 PM by Mahala Menghini, PA-C    Doing very well. Would like to see if she can decrease pantoprazole to once daily however if symptoms are recurrent she can go back to twice daily dosing. She will call us in a few weeks and let us know how it goes. Otherwise we will see her back in one year or sooner if needed.        Other   Hyperlipidemia   Last Assessment & Plan 02/11/2013 Office Visit Written 02/11/2013  4:48 PM by Lendon Colonel, NP    She will continue ongoing low cholesterol diet. She will need followup lipids and LFTs per her primary care physician within the next 6 months.      Chronic anticoagulation   Last Assessment & Plan 05/26/2011 Office Visit Written 05/26/2011  3:57 PM by Yehuda Savannah, MD    Stable and therapeutic INRs.  CBC and stool Hemoccults will be monitored to exclude occult GI blood loss.      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 S/P 12 months worth of Herceptin therapy every 3 weeks. Currently on Anastrozole beginning on 06/28/2013.     Oncology history is up-to-date  Bone density exam in April 2015 was normal.  She is due for her next bone density in April 2017.  Order is placed.  Labs today: CBC diff, CMET, Vit D level.    Compliance with Anastrozole encouraged.   Next mammogram is scheduled for later this month.    Labs in 4 months: CBC diff, CMET.    Return in 4 months for follow-up.    Influenza vaccine is given today.      Encounter for therapeutic drug monitoring   Bloating   Colon cancer screening   Last Assessment & Plan 05/11/2014 Office Visit Written 05/11/2014  3:14 PM by Mahala Menghini, PA-C    Overdue for screening colonoscopy. Patient does not want to pursue at this time given that she just completed chemotherapy for breast cancer. This is a very reasonable approach and we will discuss possible colonoscopy at her next office visit in 6 months if she desires.          Imaging: Mm Diag Breast Tomo Bilateral  02/20/2015  CLINICAL DATA:  74 year old female with history of left breast lumpectomy in October of 2014 presenting for routine follow-up. EXAM: DIGITAL DIAGNOSTIC BILATERAL MAMMOGRAM WITH 3D TOMOSYNTHESIS AND CAD COMPARISON:  Previous exam(s). ACR Breast Density Category b: There are scattered areas of fibroglandular density. FINDINGS: The  lumpectomy site in the left breast is stable. No suspicious calcifications, masses or areas of distortion are seen in the bilateral breasts. Mammographic images were processed with CAD. IMPRESSION: Stable left breast lumpectomy site. No evidence of malignancy in the bilateral breasts. RECOMMENDATION: Diagnostic mammogram is suggested in 1 year. (Code:[id number]-B-01Y) I have discussed the findings and recommendations with the patient. Results were also provided in writing at the  conclusion of the visit. If applicable, a reminder letter will be sent to the patient regarding the next appointment. BI-RADS CATEGORY  2: Benign. Electronically Signed   By: Ammie Ferrier M.D.   On: 02/20/2015 12:53

## 2015-03-20 NOTE — Progress Notes (Signed)
Cardiology Office Note   Date:  03/20/2015   ID:  Stacie Huang, DOB 30-Jul-1940, MRN [id number]  PCP:  [name], MD  Cardiologist: to be est/  Jory Sims, NP   Chief Complaint  Patient presents with  . Atrial Fibrillation      History of Present Illness: Stacie Huang is a 74 y.o. female who presents for ongoing assessment and management of atrial fib, CHADS VASC Score of 3. She has not been seen in the office since 2014 with the exception of coumadin clinic. She is here without cardiac complaints. She has not had any hospitalizations, surgeries, or new diagnosis. She is medically compliant. Labs are drawn by PCP, Dr. Karie Kirks and by oncology. She denies chest pain, racing heart rate, dyspnea or bleeding. She is medically compliant.     Past Medical History  Diagnosis Date  . Hypertension   . Paroxysmal atrial fibrillation (HCC)   . Chronic anticoagulation   . Hyperlipidemia   . GERD (gastroesophageal reflux disease)   . Cancer ([place])   . Breast cancer [facility])     Past Surgical History  Procedure Laterality Date  . Abdominal hysterectomy    . Cholecystectomy    . Partial mastectomy with axillary sentinel lymph node biopsy Left 02/23/2013    Procedure: LEFT PARTIAL MASTECTOMY WITH AXILLARY SIMPLE NODE BIOPSY, LEFT BREAST WIDE EXCISION;  Surgeon: Scherry Ran, MD;  Location: [facility];  Service: General;  Laterality: Left;  . Axillary lymph node dissection Left 02/23/2013    Procedure: LEFT AXILLARY LYMPH NODE DISSECTION;  Surgeon: Scherry Ran, MD;  Location: [facility];  Service: General;  Laterality: Left;  . Portacath placement Right 04/07/2013  . Portacath placement Right 04/07/2013    Procedure: INSERTION PORT-A-CATH;  Surgeon: Scherry Ran, MD;  Location: [facility];  Service: General;  Laterality: Right;  . Esophagogastroduodenoscopy  03/10/2002    [id number] esophagus/small HH/Tiny AVM in antrum, otherwise normal stomach and D1 and D2/Status post  passage of the [facility] dilator  . Colonoscopy  03/10/2002    RMR: Incomplete colonoscopy (sigmoidoscopy)/Normal rectum/Normal colon to 40 cm.      Current Outpatient Prescriptions  Medication Sig Dispense Refill  . acetaminophen (TYLENOL) 500 MG tablet Take 1,000 mg by mouth daily.    Marland Kitchen anastrozole (ARIMIDEX) 1 MG tablet TAKE ONE TABLET BY MOUTH DAILY. 30 tablet 5  . atenolol (TENORMIN) 100 MG tablet Take 50 mg by mouth daily before breakfast.     . calcium carbonate (OS-CAL) 600 MG TABS tablet Take 600 mg by mouth daily.    . cetirizine (ZYRTEC) 10 MG tablet Take 10 mg by mouth every morning.     . Cholecalciferol (VITAMIN D) 400 UNITS capsule Take 400 Units by mouth every morning.     . diltiazem (CARDIZEM CD) 120 MG 24 hr capsule Take 1 tablet daily 90 capsule 3  . fluticasone (FLONASE) 50 MCG/ACT nasal spray Place 2 sprays into the nose at bedtime.     Marland Kitchen LORazepam (ATIVAN) 1 MG tablet Take 1 mg by mouth daily as needed for anxiety (AND/OR FOR SLEEP).     Marland Kitchen losartan (COZAAR) 50 MG tablet Take 50 mg by mouth every morning.     . metoCLOPramide (REGLAN) 10 MG tablet Take 1 tablet (10 mg total) by mouth 4 (four) times daily -  before meals and at bedtime. 120 tablet 5  . pantoprazole (PROTONIX) 40 MG tablet TAKE (1) TABLET BY MOUTH TWICE DAILY. 60 tablet 5  .  psyllium (METAMUCIL) 58.6 % powder Take 1 packet by mouth daily.     . simvastatin (ZOCOR) 10 MG tablet Take 10 mg by mouth at bedtime.      . triamcinolone cream (KENALOG) 0.1 % Apply 1 application topically at bedtime.    Marland Kitchen warfarin (COUMADIN) 2.5 MG tablet Takes 2.5 mg (1 tablet) on Tuesday and Friday takes 1.25 mg (0.5 tablet) all other days 30 tablet 6   No current facility-administered medications for this visit.    Allergies:   Iohexol and Sulfa antibiotics    Social History:  The patient  reports that she has never smoked. She has never used smokeless tobacco. She reports that she does not drink alcohol or use illicit  drugs.   Family History:  The patient's family history includes Heart attack in her father; Heart disease in her mother; Leukemia in some other family members. There is no history of Colon cancer.    ROS: All other systems are reviewed and negative. Unless otherwise mentioned in H&P    PHYSICAL EXAM: VS:  BP 124/88 mmHg  Pulse 75  Ht 5\' 2"  (1.575 m)  Wt 144 lb (65.318 kg)  BMI 26.33 kg/m2  SpO2 97% , BMI Body mass index is 26.33 kg/(m^2). GEN: Well nourished, well developed, in no acute distress HEENT: normal Neck: no JVD, carotid bruits, or masses Cardiac: RRR; no murmurs, rubs, or gallops,no edema  Respiratory:  clear to auscultation bilaterally, normal work of breathing GI: soft, nontender, nondistended, + BS MS: no deformity or atrophy Skin: warm and dry, no rash Neuro:  Strength and sensation are intact Psych: euthymic mood, full affect   Recent Labs: 02/14/2015: ALT 12*; BUN 18; Creatinine, Ser 1.06*; Hemoglobin 12.2; Platelets 350; Potassium 4.1; Sodium 139    Lipid Panel    Component Value Date/Time   CHOL 150 01/29/2010 1155   TRIG 135 01/29/2010 1155   HDL 63 01/29/2010 1155   LDLCALC 60 01/29/2010 1155      Wt Readings from Last 3 Encounters:  03/20/15 144 lb (65.318 kg)  02/14/15 145 lb (65.772 kg)  11/09/14 145 lb (65.772 kg)     ASSESSMENT AND PLAN:  1. Atrial fib: Heart rate is well controlled. Continue anticoagualtion. CHADS VASC Score of 3.   2. Hypertension: Well controlled. No changes.    Current medicines are reviewed at length with the patient today.    Labs/ tests ordered today include: No orders of the defined types were placed in this encounter.     Disposition:   FU with one year Signed, Jory Sims, NP  03/20/2015 2:25 PM    [facility] [address] [place], [place] [address] Phone: [phone]; Fax: [phone]

## 2015-03-20 NOTE — Patient Instructions (Signed)
Your physician wants you to follow-up in: 1 Year with Dr. Branch. You will receive a reminder letter in the mail two months in advance. If you don't receive a letter, please call our office to schedule the follow-up appointment.   Your physician recommends that you continue on your current medications as directed. Please refer to the Current Medication list given to you today.  If you need a refill on your cardiac medications before your next appointment, please call your pharmacy.  Thank you for choosing [facility]!   

## 2015-04-09 ENCOUNTER — Ambulatory Visit (INDEPENDENT_AMBULATORY_CARE_PROVIDER_SITE_OTHER): Payer: Medicare Other | Admitting: *Deleted

## 2015-04-09 DIAGNOSIS — I4891 Unspecified atrial fibrillation: Secondary | ICD-10-CM | POA: Diagnosis not present

## 2015-04-09 DIAGNOSIS — Z5181 Encounter for therapeutic drug level monitoring: Secondary | ICD-10-CM | POA: Diagnosis not present

## 2015-04-09 DIAGNOSIS — Z7901 Long term (current) use of anticoagulants: Secondary | ICD-10-CM

## 2015-04-09 LAB — POCT INR: INR: 2.6

## 2015-04-10 ENCOUNTER — Other Ambulatory Visit (HOSPITAL_COMMUNITY): Payer: Self-pay | Admitting: Oncology

## 2015-04-11 ENCOUNTER — Encounter (HOSPITAL_COMMUNITY): Payer: Medicare Other

## 2015-04-16 ENCOUNTER — Encounter (HOSPITAL_COMMUNITY): Payer: Medicare Other | Attending: Hematology and Oncology

## 2015-04-16 VITALS — BP 115/58 | HR 93 | Temp 97.9°F | Resp 18

## 2015-04-16 DIAGNOSIS — Z452 Encounter for adjustment and management of vascular access device: Secondary | ICD-10-CM | POA: Diagnosis present

## 2015-04-16 DIAGNOSIS — C50212 Malignant neoplasm of upper-inner quadrant of left female breast: Secondary | ICD-10-CM | POA: Diagnosis not present

## 2015-04-16 DIAGNOSIS — Z95828 Presence of other vascular implants and grafts: Secondary | ICD-10-CM

## 2015-04-16 MED ORDER — SODIUM CHLORIDE 0.9 % IJ SOLN
10.0000 mL | Freq: Once | INTRAMUSCULAR | Status: AC
  Administered 2015-04-16: 10 mL via INTRAVENOUS

## 2015-04-16 MED ORDER — HEPARIN SOD (PORK) LOCK FLUSH 100 UNIT/ML IV SOLN
500.0000 [IU] | Freq: Once | INTRAVENOUS | Status: AC
  Administered 2015-04-16: 500 [IU] via INTRAVENOUS
  Filled 2015-04-16: qty 5

## 2015-04-16 NOTE — Progress Notes (Signed)
Stacie Huang presented for Portacath access and flush. Proper placement of portacath confirmed by CXR. Portacath located right chest wall accessed with  H 20 needle. Good blood return present. Portacath flushed with 20ml NS and 500U/5ml Heparin and needle removed intact. Procedure without incident. Patient tolerated procedure well.   

## 2015-04-16 NOTE — Patient Instructions (Signed)
[facility] at [facility] Discharge Instructions  RECOMMENDATIONS MADE BY THE CONSULTANT AND ANY TEST RESULTS WILL BE SENT TO YOUR REFERRING PHYSICIAN.  Port flush today as scheduled. Return as scheduled.  Thank you for choosing [facility] at [facility] to provide your oncology and hematology care.  To afford each patient quality time with our provider, please arrive at least 15 minutes before your scheduled appointment time.    You need to re-schedule your appointment should you arrive 10 or more minutes late.  We strive to give you quality time with our providers, and arriving late affects you and other patients whose appointments are after yours.  Also, if you no show three or more times for appointments you may be dismissed from the clinic at the providers discretion.     Again, thank you for choosing [facility].  Our hope is that these requests will decrease the amount of time that you wait before being seen by our physicians.       _____________________________________________________________  Should you have questions after your visit to [facility], please contact our office at [phone] between the hours of 8:30 a.m. and 4:30 p.m.  Voicemails left after 4:30 p.m. will not be returned until the following business day.  For prescription refill requests, have your pharmacy contact our office.    

## 2015-05-10 ENCOUNTER — Other Ambulatory Visit: Payer: Self-pay | Admitting: Adult Health

## 2015-05-14 ENCOUNTER — Ambulatory Visit (INDEPENDENT_AMBULATORY_CARE_PROVIDER_SITE_OTHER): Payer: Medicare Other | Admitting: *Deleted

## 2015-05-14 DIAGNOSIS — Z7901 Long term (current) use of anticoagulants: Secondary | ICD-10-CM | POA: Diagnosis not present

## 2015-05-14 DIAGNOSIS — I4891 Unspecified atrial fibrillation: Secondary | ICD-10-CM

## 2015-05-14 DIAGNOSIS — Z5181 Encounter for therapeutic drug level monitoring: Secondary | ICD-10-CM

## 2015-05-14 LAB — POCT INR: INR: 2.6

## 2015-06-05 NOTE — Progress Notes (Signed)
[name], MD [place] [id number]  Breast cancer of upper-inner quadrant of left female breast ([place]) - Plan: MM DIAG BREAST TOMO BILATERAL, US BREAST LTD UNI LEFT INC AXILLA, US BREAST LTD UNI RIGHT INC AXILLA, CBC with Differential, Comprehensive metabolic panel  CURRENT THERAPY: Anastrozole 1 mg daily beginning on 06/28/2013  INTERVAL HISTORY: BRAYLA PAT 75 y.o. 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 Now, S/P 12 months worth of Herceptin therapy every 3 weeks. Currently on Anastrozole started on 06/28/2013.     Breast cancer of upper-inner quadrant of left female breast ([place])   02/04/2013 Initial Diagnosis Breast cancer of upper-inner quadrant of left female breast   02/23/2013 Surgery left lumpectomy (UIQ), sentinel node biopsy--0.7cm, Node negative, ER+, PR+, HER-2/neu over expressed.   04/08/2013 - 04/14/2013 Chemotherapy Paclitaxel/Herceptin weekly x 12. Intolerance to Paclitaxel and only receiving 2 cycles   05/04/2013 - 04/05/2014 Chemotherapy Herceptin every 21 days    06/28/2013 -  Chemotherapy Anastrazole started   08/02/2013 Imaging Bone density- normal   04/06/2014 Imaging MUGA- Left ventricular ejection fraction equals 53%.     I personally reviewed and went over laboratory results with the patient.  The results are noted within this dictation.  I personally reviewed and went over radiographic studies with the patient.  The results are noted within this dictation.  Mammogram on 02/20/2015 was BIRADS 1.  She is doing very well.  She notes AM hot flashes, but "they aren't that bad."  She denies any interference with ADLs.  She admits to compliance of her aromatase inhibitor.  She denies any arthralgias and myalgias.  She denies any vaginal bleeding.  She is interested in  "getting my hair done today."    She refuses a breast exam today stating, "you did one last time."  She denies any changes in her breast and notes that she does breast exams on herself regularly.   She is welcome to have her port removed, but she "kind of likes" the device.  Other than the flushes that are required, she reports that it does not bother her and she wants to keep it for now.  She notes that Dr. Romona Curls placed the port.  If she decides to have it removed, I will refer her to Dr. Arnoldo Morale for port removal.   Past Medical History  Diagnosis Date  . Hypertension   . Paroxysmal atrial fibrillation (HCC)   . Chronic anticoagulation   . Hyperlipidemia   . GERD (gastroesophageal reflux disease)   . Cancer ([place])   . Breast cancer ([place])     has Hyperlipidemia; Chronic anticoagulation; Atrial fibrillation ([place]); Hypertension; Vitamin D deficiency; Breast cancer of upper-inner quadrant of left female breast ([place]); Paroxysmal atrial fibrillation ([place]); Encounter for therapeutic drug monitoring; GERD (gastroesophageal reflux disease); Bloating; and Colon cancer screening on her problem list.     is allergic to iohexol and sulfa antibiotics.  Current Outpatient Prescriptions on File Prior to Visit  Medication Sig Dispense Refill  . acetaminophen (TYLENOL) 500 MG tablet Take 1,000 mg by mouth daily.    Marland Kitchen anastrozole (ARIMIDEX) 1 MG tablet TAKE ONE TABLET BY MOUTH DAILY. 30 tablet 5  . atenolol (TENORMIN) 100 MG tablet Take 50 mg by mouth daily before breakfast.     . calcium carbonate (OS-CAL) 600  MG TABS tablet Take 600 mg by mouth daily.    . cetirizine (ZYRTEC) 10 MG tablet Take 10 mg by mouth every morning.     . Cholecalciferol (VITAMIN D) 400 UNITS capsule Take 400 Units by mouth every morning.     . diltiazem (CARDIZEM CD) 120 MG 24 hr capsule TAKE 1 CAPSULE BY MOUTH EVERY DAY. 90 capsule 3  . fluticasone (FLONASE) 50 MCG/ACT nasal spray Place 2 sprays into the nose at bedtime.      Marland Kitchen LORazepam (ATIVAN) 1 MG tablet Take 1 mg by mouth daily as needed for anxiety (AND/OR FOR SLEEP).     Marland Kitchen losartan (COZAAR) 50 MG tablet Take 50 mg by mouth every morning.     . metoCLOPramide (REGLAN) 10 MG tablet Take 1 tablet (10 mg total) by mouth 3 (three) times daily before meals. 120 tablet 1  . pantoprazole (PROTONIX) 40 MG tablet TAKE (1) TABLET BY MOUTH TWICE DAILY. 60 tablet 5  . psyllium (METAMUCIL) 58.6 % powder Take 1 packet by mouth daily.     . simvastatin (ZOCOR) 10 MG tablet Take 10 mg by mouth at bedtime.      . triamcinolone cream (KENALOG) 0.1 % Apply 1 application topically at bedtime.    Marland Kitchen warfarin (COUMADIN) 2.5 MG tablet TAKE ONE TABLET BY MOUTH ON TUESDAYS AND FRIDAYS; TAKE 1/2 TABLET ON ALL OTHER DAYS. 30 tablet 1   No current facility-administered medications on file prior to visit.    Past Surgical History  Procedure Laterality Date  . Abdominal hysterectomy    . Cholecystectomy    . Partial mastectomy with axillary sentinel lymph node biopsy Left 02/23/2013    Procedure: LEFT PARTIAL MASTECTOMY WITH AXILLARY SIMPLE NODE BIOPSY, LEFT BREAST WIDE EXCISION;  Surgeon: Scherry Ran, MD;  Location: [facility];  Service: General;  Laterality: Left;  . Axillary lymph node dissection Left 02/23/2013    Procedure: LEFT AXILLARY LYMPH NODE DISSECTION;  Surgeon: Scherry Ran, MD;  Location: [facility];  Service: General;  Laterality: Left;  . Portacath placement Right 04/07/2013  . Portacath placement Right 04/07/2013    Procedure: INSERTION PORT-A-CATH;  Surgeon: Scherry Ran, MD;  Location: [facility];  Service: General;  Laterality: Right;  . Esophagogastroduodenoscopy  03/10/2002    NWG:NFAOZH esophagus/small HH/Tiny AVM in antrum, otherwise normal stomach and D1 and D2/Status post passage of the [facility] dilator  . Colonoscopy  03/10/2002    RMR: Incomplete colonoscopy (sigmoidoscopy)/Normal rectum/Normal colon to 40 cm.     Denies any headaches, dizziness,  double vision, fevers, chills, night sweats, nausea, vomiting, diarrhea, constipation, chest pain, heart palpitations, shortness of breath, blood in stool, black tarry stool, urinary pain, urinary burning, urinary frequency, hematuria.   PHYSICAL EXAMINATION  ECOG PERFORMANCE STATUS: 0 - Asymptomatic  Filed Vitals:   06/06/15 1124  BP: 125/78  Pulse: 98  Temp: 97.9 F (36.6 C)  Resp: 18    GENERAL:alert, no distress, well nourished, well developed, comfortable, cooperative, smiling and unaccompanied SKIN: skin color, texture, turgor are normal, no rashes or significant lesions HEAD: Normocephalic, No masses, lesions, tenderness or abnormalities EYES: normal, PERRLA, EOMI, Conjunctiva are pink and non-injected EARS: External ears normal OROPHARYNX:lips, buccal mucosa, and tongue normal and mucous membranes are moist  NECK: supple, no adenopathy, thyroid normal size, non-tender, without nodularity, trachea midline LYMPH:  no palpable lymphadenopathy, no hepatosplenomegaly BREAST: She refuses a breast exam, "you did one last time."  Her last breast exam demonstrated a right breast  normal without mass, skin or nipple changes or axillary nodes, left breast normal without mass, skin or nipple changes or axillary nodes LUNGS: clear to auscultation and percussion HEART: regular rate & rhythm, no murmurs, no gallops, S1 normal and S2 normal ABDOMEN:abdomen soft, non-tender, normal bowel sounds and no masses or organomegaly BACK: Back symmetric, no curvature., No CVA tenderness EXTREMITIES:less then 2 second capillary refill, no joint deformities, effusion, or inflammation, no edema, no skin discoloration, no clubbing, no cyanosis  NEURO: alert & oriented x 3 with fluent speech, no focal motor/sensory deficits, gait normal    LABORATORY DATA: CBC    Component Value Date/Time   WBC 8.5 06/06/2015 1000   RBC 4.29 06/06/2015 1000   HGB 12.6 06/06/2015 1000   HCT 39.8 06/06/2015 1000   PLT  285 06/06/2015 1000   MCV 92.8 06/06/2015 1000   MCH 29.4 06/06/2015 1000   MCHC 31.7 06/06/2015 1000   RDW 13.5 06/06/2015 1000   LYMPHSABS 2.8 06/06/2015 1000   MONOABS 0.7 06/06/2015 1000   EOSABS 0.1 06/06/2015 1000   BASOSABS 0.0 06/06/2015 1000      Chemistry      Component Value Date/Time   NA 139 02/14/2015 1310   K 4.1 02/14/2015 1310   CL 104 02/14/2015 1310   CO2 25 02/14/2015 1310   BUN 18 02/14/2015 1310   CREATININE 1.06* 02/14/2015 1310      Component Value Date/Time   CALCIUM 9.2 02/14/2015 1310   ALKPHOS 50 02/14/2015 1310   AST 19 02/14/2015 1310   ALT 12* 02/14/2015 1310   BILITOT 0.6 02/14/2015 1310        PENDING LABS:   RADIOGRAPHIC STUDIES:  No results found.   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 S/P 12 months worth of Herceptin therapy every 3 weeks. Currently on Anastrozole beginning on 06/28/2013.     Oncology history is up-to-date  Bone density exam in April 2015 was normal.  She is due for her next bone density in April 2017.  This is scheduled for 08/06/2015.  Labs today: CBC diff, CMET, Vit D level.    Compliance with Anastrozole encouraged.   Next mammogram is ordered for October 2017.  Labs in 4 months: CBC diff, CMET.    She is welcome to have her port removed.  She would like to keep it for the time being.  She will let us know if she would like it removed and if so, I will refer her to Dr. Arnoldo Morale.  Dr. Romona Curls placed the port, but he has officially retired as of Sept 2016.  Return in 4 months for follow-up.  This coming fall, if all is well, we can consider decreasing the frequency of her appointments to every 6 months.  She looks forward to that time.    THERAPY PLAN:  NCCN guidelines  recommends the following surveillance for invasive breast cancer:  A. History and Physical exam every 4-6 months for 5 years and then every 12 months.  B. Mammography every 12 months  C. Women on Tamoxifen: annual gynecologic assessment every 12 months if uterus is present.  D. Women on aromatase inhibitor or who experience ovarian failure secondary to treatment should have monitoring of bone health with a bone mineral density determination at baseline  and periodically thereafter.  E. Assess and encourage adherence to adjuvant endocrine therapy.  F. Evidence suggests that active lifestyle and achieving and maintaining an ideal body weight (20-25 BMI) may lead to optimal breast cancer outcomes.   All questions were answered. The patient knows to call the clinic with any problems, questions or concerns. We can certainly see the patient much sooner if necessary.  Patient and plan discussed with Dr. Ancil Linsey and she is in agreement with the aforementioned.   This note is electronically signed by: Doy Mince 06/06/2015 12:56 PM

## 2015-06-05 NOTE — Assessment & Plan Note (Addendum)
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 S/P 12 months worth of Herceptin therapy every 3 weeks. Currently on Anastrozole beginning on 06/28/2013.     Oncology history is up-to-date  Bone density exam in April 2015 was normal.  She is due for her next bone density in April 2017.  This is scheduled for 08/06/2015.  Labs today: CBC diff, CMET, Vit D level.    Compliance with Anastrozole encouraged.   Next mammogram is ordered for October 2017.  Labs in 4 months: CBC diff, CMET.    She is welcome to have her port removed.  She would like to keep it for the time being.  She will let us know if she would like it removed and if so, I will refer her to Dr. Jenkins.  Dr. Bradford placed the port, but he has officially retired as of Sept 2016.  Return in 4 months for follow-up.  This coming fall, if all is well, we can consider decreasing the frequency of her appointments to every 6 months.  She looks forward to that time. 

## 2015-06-06 ENCOUNTER — Encounter (HOSPITAL_COMMUNITY): Payer: Self-pay | Admitting: Oncology

## 2015-06-06 ENCOUNTER — Encounter (HOSPITAL_BASED_OUTPATIENT_CLINIC_OR_DEPARTMENT_OTHER): Payer: Medicare Other | Admitting: Oncology

## 2015-06-06 ENCOUNTER — Ambulatory Visit (HOSPITAL_COMMUNITY): Payer: Medicare Other | Admitting: Oncology

## 2015-06-06 ENCOUNTER — Encounter (HOSPITAL_COMMUNITY): Payer: Medicare Other | Attending: Hematology and Oncology

## 2015-06-06 VITALS — BP 125/78 | HR 98 | Temp 97.9°F | Resp 18 | Wt 145.7 lb

## 2015-06-06 DIAGNOSIS — C50212 Malignant neoplasm of upper-inner quadrant of left female breast: Secondary | ICD-10-CM | POA: Insufficient documentation

## 2015-06-06 DIAGNOSIS — Z95828 Presence of other vascular implants and grafts: Secondary | ICD-10-CM

## 2015-06-06 LAB — COMPREHENSIVE METABOLIC PANEL
ALT: 15 U/L (ref 14–54)
ANION GAP: 9 (ref 5–15)
AST: 22 U/L (ref 15–41)
Albumin: 4.2 g/dL (ref 3.5–5.0)
Alkaline Phosphatase: 51 U/L (ref 38–126)
BUN: 16 mg/dL (ref 6–20)
CALCIUM: 9.1 mg/dL (ref 8.9–10.3)
CO2: 25 mmol/L (ref 22–32)
Chloride: 105 mmol/L (ref 101–111)
Creatinine, Ser: 0.99 mg/dL (ref 0.44–1.00)
GFR calc Af Amer: >60 mL/min (ref >60)
GFR calc non Af Amer: 54 mL/min — ABNORMAL LOW (ref >60)
GLUCOSE: 90 mg/dL (ref 65–99)
POTASSIUM: 4.4 mmol/L (ref 3.5–5.1)
SODIUM: 139 mmol/L (ref 135–145)
Total Bilirubin: 0.5 mg/dL (ref 0.3–1.2)
Total Protein: 6.9 g/dL (ref 6.5–8.1)

## 2015-06-06 LAB — CBC WITH DIFFERENTIAL/PLATELET
Basophils Absolute: 0 10*3/uL (ref 0.0–0.1)
Basophils Relative: 1 %
EOS ABS: 0.1 10*3/uL (ref 0.0–0.7)
EOS PCT: 1 %
HCT: 39.8 % (ref 36.0–46.0)
Hemoglobin: 12.6 g/dL (ref 12.0–15.0)
LYMPHS ABS: 2.8 10*3/uL (ref 0.7–4.0)
Lymphocytes Relative: 33 %
MCH: 29.4 pg (ref 26.0–34.0)
MCHC: 31.7 g/dL (ref 30.0–36.0)
MCV: 92.8 fL (ref 78.0–100.0)
MONOS PCT: 9 %
Monocytes Absolute: 0.7 10*3/uL (ref 0.1–1.0)
Neutro Abs: 4.9 10*3/uL (ref 1.7–7.7)
Neutrophils Relative %: 56 %
PLATELETS: 285 10*3/uL (ref 150–400)
RBC: 4.29 MIL/uL (ref 3.87–5.11)
RDW: 13.5 % (ref 11.5–15.5)
WBC: 8.5 10*3/uL (ref 4.0–10.5)

## 2015-06-06 MED ORDER — HEPARIN SOD (PORK) LOCK FLUSH 100 UNIT/ML IV SOLN
500.0000 [IU] | Freq: Once | INTRAVENOUS | Status: AC
  Administered 2015-06-06: 500 [IU] via INTRAVENOUS

## 2015-06-06 MED ORDER — SODIUM CHLORIDE 0.9% FLUSH
10.0000 mL | Freq: Once | INTRAVENOUS | Status: AC
  Administered 2015-06-06: 10 mL via INTRAVENOUS

## 2015-06-06 MED ORDER — HEPARIN SOD (PORK) LOCK FLUSH 100 UNIT/ML IV SOLN
INTRAVENOUS | Status: AC
  Filled 2015-06-06: qty 5

## 2015-06-06 NOTE — Patient Instructions (Signed)
..  Stacie Huang at [facility] Discharge Instructions  RECOMMENDATIONS MADE BY THE CONSULTANT AND ANY TEST RESULTS WILL BE SENT TO YOUR REFERRING PHYSICIAN.  Mammogram due in October Bone density April 2017 Labs in 4 months Return in 4 months to see Dr. Whitney Muse  Thank you for choosing [place] at [facility] to provide your oncology and hematology care.  To afford each patient quality time with our provider, please arrive at least 15 minutes before your scheduled appointment time.   Beginning January 23rd 2017 lab work for the [facility] will be done in the  Main lab at [facility] on 1st floor. If you have a lab appointment with the [facility] please come in thru the  Main Entrance and check in at the main information desk  You need to re-schedule your appointment should you arrive 10 or more minutes late.  We strive to give you quality time with our providers, and arriving late affects you and other patients whose appointments are after yours.  Also, if you no show three or more times for appointments you may be dismissed from the clinic at the providers discretion.     Again, thank you for choosing [facility].  Our hope is that these requests will decrease the amount of time that you wait before being seen by our physicians.       _____________________________________________________________  Should you have questions after your visit to [facility], please contact our office at [phone] between the hours of 8:30 a.m. and 4:30 p.m.  Voicemails left after 4:30 p.m. will not be returned until the following business day.  For prescription refill requests, have your pharmacy contact our office.

## 2015-06-06 NOTE — Patient Instructions (Signed)
[place] at [facility] Discharge Instructions  RECOMMENDATIONS MADE BY THE CONSULTANT AND ANY TEST RESULTS WILL BE SENT TO YOUR REFERRING PHYSICIAN.  Port flush today. Port flush every 6-8 weeks. Return as scheduled.  Thank you for choosing [place] at [facility] to provide your oncology and hematology care.  To afford each patient quality time with our provider, please arrive at least 15 minutes before your scheduled appointment time.   Beginning January 23rd 2017 lab work for the [facility] will be done in the  Main lab at [facility] on 1st floor. If you have a lab appointment with the [facility] please come in thru the  Main Entrance and check in at the main information desk  You need to re-schedule your appointment should you arrive 10 or more minutes late.  We strive to give you quality time with our providers, and arriving late affects you and other patients whose appointments are after yours.  Also, if you no show three or more times for appointments you may be dismissed from the clinic at the providers discretion.     Again, thank you for choosing [facility].  Our hope is that these requests will decrease the amount of time that you wait before being seen by our physicians.       _____________________________________________________________  Should you have questions after your visit to [facility], please contact our office at [phone] between the hours of 8:30 a.m. and 4:30 p.m.  Voicemails left after 4:30 p.m. will not be returned until the following business day.  For prescription refill requests, have your pharmacy contact our office.

## 2015-06-06 NOTE — Progress Notes (Signed)
Mak[place]y A Mallis presented for Portacath access and flush. Proper placement of portacath confirmed by CXR. Portacath located right chest wall accessed with  H 20 needle. Good blood return present. Portacath flushed with 20ml NS and 500U/5ml Heparin and needle removed intact. Procedure without incident. Patient tolerated procedure well.   

## 2015-06-07 LAB — VITAMIN D 25 HYDROXY (VIT D DEFICIENCY, FRACTURES): Vit D, 25-Hydroxy: 31.5 ng/mL (ref 30.0–100.0)

## 2015-06-25 ENCOUNTER — Ambulatory Visit (INDEPENDENT_AMBULATORY_CARE_PROVIDER_SITE_OTHER): Payer: Medicare Other | Admitting: *Deleted

## 2015-06-25 DIAGNOSIS — Z5181 Encounter for therapeutic drug level monitoring: Secondary | ICD-10-CM | POA: Diagnosis not present

## 2015-06-25 DIAGNOSIS — Z7901 Long term (current) use of anticoagulants: Secondary | ICD-10-CM | POA: Diagnosis not present

## 2015-06-25 DIAGNOSIS — I4891 Unspecified atrial fibrillation: Secondary | ICD-10-CM

## 2015-06-25 LAB — POCT INR: INR: 2.5

## 2015-07-23 ENCOUNTER — Other Ambulatory Visit (HOSPITAL_COMMUNITY): Payer: Self-pay | Admitting: Oncology

## 2015-08-01 ENCOUNTER — Encounter (HOSPITAL_COMMUNITY): Payer: Medicare Other

## 2015-08-02 ENCOUNTER — Encounter (HOSPITAL_COMMUNITY): Payer: Medicare Other | Attending: Hematology and Oncology

## 2015-08-02 DIAGNOSIS — C50212 Malignant neoplasm of upper-inner quadrant of left female breast: Secondary | ICD-10-CM

## 2015-08-02 DIAGNOSIS — Z452 Encounter for adjustment and management of vascular access device: Secondary | ICD-10-CM

## 2015-08-02 MED ORDER — SODIUM CHLORIDE 0.9% FLUSH
10.0000 mL | INTRAVENOUS | Status: DC | PRN
  Administered 2015-08-02: 10 mL via INTRAVENOUS
  Filled 2015-08-02: qty 10

## 2015-08-02 MED ORDER — HEPARIN SOD (PORK) LOCK FLUSH 100 UNIT/ML IV SOLN
500.0000 [IU] | Freq: Once | INTRAVENOUS | Status: AC
  Administered 2015-08-02: 500 [IU] via INTRAVENOUS
  Filled 2015-08-02: qty 5

## 2015-08-02 NOTE — Progress Notes (Signed)
Vickii Penna presented for Portacath access and flush.    Portacath located rt chest wall accessed with  H 20 needle.  Good blood return present. Portacath flushed with 61ml NS and 500U/5ml Heparin and needle removed intact.  Procedure tolerated well and without incident.

## 2015-08-06 ENCOUNTER — Ambulatory Visit (HOSPITAL_COMMUNITY)
Admission: RE | Admit: 2015-08-06 | Discharge: 2015-08-06 | Disposition: A | Discharge status: Home / Self Care | Payer: Medicare Other | Source: Ambulatory Visit | Attending: Oncology | Admitting: Oncology

## 2015-08-06 ENCOUNTER — Ambulatory Visit (INDEPENDENT_AMBULATORY_CARE_PROVIDER_SITE_OTHER): Payer: Medicare Other | Admitting: *Deleted

## 2015-08-06 DIAGNOSIS — M85832 Other specified disorders of bone density and structure, left forearm: Secondary | ICD-10-CM | POA: Diagnosis not present

## 2015-08-06 DIAGNOSIS — Z780 Asymptomatic menopausal state: Secondary | ICD-10-CM | POA: Diagnosis present

## 2015-08-06 DIAGNOSIS — Z5181 Encounter for therapeutic drug level monitoring: Secondary | ICD-10-CM | POA: Diagnosis not present

## 2015-08-06 DIAGNOSIS — I4891 Unspecified atrial fibrillation: Secondary | ICD-10-CM

## 2015-08-06 DIAGNOSIS — Z79811 Long term (current) use of aromatase inhibitors: Secondary | ICD-10-CM | POA: Insufficient documentation

## 2015-08-06 DIAGNOSIS — Z7901 Long term (current) use of anticoagulants: Secondary | ICD-10-CM

## 2015-08-06 DIAGNOSIS — C50212 Malignant neoplasm of upper-inner quadrant of left female breast: Secondary | ICD-10-CM | POA: Diagnosis present

## 2015-08-06 LAB — POCT INR: INR: 2.3

## 2015-08-09 ENCOUNTER — Other Ambulatory Visit: Payer: Self-pay | Admitting: Gastroenterology

## 2015-08-18 ENCOUNTER — Other Ambulatory Visit: Payer: Self-pay | Admitting: Adult Health

## 2015-09-05 ENCOUNTER — Other Ambulatory Visit (HOSPITAL_COMMUNITY): Payer: Self-pay | Admitting: Oncology

## 2015-09-17 ENCOUNTER — Ambulatory Visit (INDEPENDENT_AMBULATORY_CARE_PROVIDER_SITE_OTHER): Payer: Medicare Other | Admitting: *Deleted

## 2015-09-17 DIAGNOSIS — I4891 Unspecified atrial fibrillation: Secondary | ICD-10-CM | POA: Diagnosis not present

## 2015-09-17 DIAGNOSIS — Z5181 Encounter for therapeutic drug level monitoring: Secondary | ICD-10-CM | POA: Diagnosis not present

## 2015-09-17 LAB — POCT INR: INR: 1.9

## 2015-09-26 ENCOUNTER — Encounter (HOSPITAL_COMMUNITY): Payer: Self-pay | Admitting: Hematology & Oncology

## 2015-09-26 ENCOUNTER — Encounter (HOSPITAL_COMMUNITY): Payer: Medicare Other

## 2015-09-26 ENCOUNTER — Other Ambulatory Visit (HOSPITAL_COMMUNITY): Payer: Medicare Other

## 2015-09-26 ENCOUNTER — Encounter (HOSPITAL_COMMUNITY): Payer: Medicare Other | Attending: Hematology and Oncology | Admitting: Hematology & Oncology

## 2015-09-26 VITALS — BP 117/77 | HR 75 | Temp 98.3°F | Resp 18 | Wt 146.2 lb

## 2015-09-26 DIAGNOSIS — C50212 Malignant neoplasm of upper-inner quadrant of left female breast: Secondary | ICD-10-CM

## 2015-09-26 DIAGNOSIS — E559 Vitamin D deficiency, unspecified: Secondary | ICD-10-CM

## 2015-09-26 DIAGNOSIS — M8580 Other specified disorders of bone density and structure, unspecified site: Secondary | ICD-10-CM | POA: Diagnosis not present

## 2015-09-26 DIAGNOSIS — Z79811 Long term (current) use of aromatase inhibitors: Secondary | ICD-10-CM

## 2015-09-26 LAB — COMPREHENSIVE METABOLIC PANEL
ALK PHOS: 51 U/L (ref 38–126)
ALT: 15 U/L (ref 14–54)
ANION GAP: 8 (ref 5–15)
AST: 22 U/L (ref 15–41)
Albumin: 4.3 g/dL (ref 3.5–5.0)
BILIRUBIN TOTAL: 0.6 mg/dL (ref 0.3–1.2)
BUN: 15 mg/dL (ref 6–20)
CALCIUM: 9 mg/dL (ref 8.9–10.3)
CO2: 25 mmol/L (ref 22–32)
CREATININE: 0.92 mg/dL (ref 0.44–1.00)
Chloride: 104 mmol/L (ref 101–111)
GFR, EST NON AFRICAN AMERICAN: 59 mL/min — AB (ref >60)
Glucose, Bld: 86 mg/dL (ref 65–99)
Potassium: 4.2 mmol/L (ref 3.5–5.1)
Sodium: 137 mmol/L (ref 135–145)
TOTAL PROTEIN: 7.2 g/dL (ref 6.5–8.1)

## 2015-09-26 LAB — CBC WITH DIFFERENTIAL/PLATELET
Basophils Absolute: 0 10*3/uL (ref 0.0–0.1)
Basophils Relative: 1 %
EOS ABS: 0.1 10*3/uL (ref 0.0–0.7)
Eosinophils Relative: 1 %
HCT: 39 % (ref 36.0–46.0)
HEMOGLOBIN: 12.6 g/dL (ref 12.0–15.0)
LYMPHS ABS: 3 10*3/uL (ref 0.7–4.0)
Lymphocytes Relative: 37 %
MCH: 29.8 pg (ref 26.0–34.0)
MCHC: 32.3 g/dL (ref 30.0–36.0)
MCV: 92.2 fL (ref 78.0–100.0)
Monocytes Absolute: 0.8 10*3/uL (ref 0.1–1.0)
Monocytes Relative: 10 %
Neutro Abs: 4.1 10*3/uL (ref 1.7–7.7)
Neutrophils Relative %: 51 %
Platelets: 277 10*3/uL (ref 150–400)
RBC: 4.23 MIL/uL (ref 3.87–5.11)
RDW: 13.9 % (ref 11.5–15.5)
WBC: 7.9 10*3/uL (ref 4.0–10.5)

## 2015-09-26 MED ORDER — HEPARIN SOD (PORK) LOCK FLUSH 100 UNIT/ML IV SOLN
500.0000 [IU] | Freq: Once | INTRAVENOUS | Status: AC
  Administered 2015-09-26: 500 [IU]
  Filled 2015-09-26: qty 5

## 2015-09-26 NOTE — Progress Notes (Signed)
Stacie Bellow, MD [address] [place] [address]  No diagnosis found.  CURRENT THERAPY: Anastrozole 1 mg daily beginning on 06/28/2013  INTERVAL HISTORY: Stacie Huang 75 y.o. 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 Now, S/P 12 months worth of Herceptin therapy every 3 weeks. Currently on Anastrozole started on 06/28/2013.   She continues to tolerate endocrine therapy well.  She notes mild hot flashes and reports that they do not interfere with QOL.  She denies any arthralgias or myalgias that are new or worse since starting Anastrozole.    Stacie Huang is unaccompanied.   She has been doing all right with nothing new. She takes calcium and Vitamin D. She is not very active and doesn't walk as much as she should. She is eating well. She is not smoking. She is sleeping well. She is not concerned about her breasts. She denies any lumps or bumps. She denies any bowel issues or blood in stool. She denies breathing issues.    She lives by herself without family nearby, and admits she sometimes becomes depressed but she prays and it goes away. She participates in bible study. She goes to the senior center daily and eats lunch there.  Her PCP is Dr. Karie Kirks. She sees a cardiologist here at [facility].       Breast cancer of upper-inner quadrant of left female breast ([place])   02/04/2013 Initial Diagnosis Breast cancer of upper-inner quadrant of left female breast   02/23/2013 Surgery left lumpectomy (UIQ), sentinel node biopsy--0.7cm, Node negative, ER+, PR+, HER-2/neu over expressed.   04/08/2013 - 04/14/2013 Chemotherapy Paclitaxel/Herceptin weekly x 12. Intolerance to Paclitaxel and only receiving 2 cycles   05/04/2013 - 04/05/2014 Chemotherapy Herceptin every 21 days    06/28/2013 -  Chemotherapy Anastrazole  started   08/02/2013 Imaging Bone density- normal   04/06/2014 Imaging MUGA- Left ventricular ejection fraction equals 53%.   08/06/2015 Imaging Bone density- osteopenia    Past Medical History  Diagnosis Date  . Hypertension   . Paroxysmal atrial fibrillation (HCC)   . Chronic anticoagulation   . Hyperlipidemia   . GERD (gastroesophageal reflux disease)   . Cancer ([place])   . Breast cancer ([facility])     has Hyperlipidemia; Chronic anticoagulation; Atrial fibrillation ([place]); Hypertension; Vitamin D deficiency; Breast cancer of upper-inner quadrant of left female breast ([place]); Paroxysmal atrial fibrillation ([place]); Encounter for therapeutic drug monitoring; GERD (gastroesophageal reflux disease); Bloating; and Colon cancer screening on her problem list.     is allergic to iohexol and sulfa antibiotics.  We administered heparin lock flush and sodium chloride.  Past Surgical History  Procedure Laterality Date  . Abdominal hysterectomy    . Cholecystectomy    . Partial mastectomy with axillary sentinel lymph node biopsy Left 02/23/2013    Procedure: LEFT PARTIAL MASTECTOMY WITH AXILLARY SIMPLE NODE BIOPSY, LEFT BREAST WIDE EXCISION;  Surgeon: Scherry Ran, MD;  Location: [facility];  Service: General;  Laterality: Left;  . Axillary lymph node dissection Left 02/23/2013    Procedure: LEFT AXILLARY LYMPH NODE DISSECTION;  Surgeon: Scherry Ran, MD;  Location: [facility];  Service: General;  Laterality: Left;  . Portacath placement Right 04/07/2013  . Portacath placement Right 04/07/2013    Procedure: INSERTION PORT-A-CATH;  Surgeon: Scherry Ran, MD;  Location: [facility];  Service: General;  Laterality: Right;  . Esophagogastroduodenoscopy  03/10/2002    JOI:TGPQDI esophagus/small HH/Tiny AVM in antrum, otherwise normal stomach and D1 and D2/Status post passage of the [facility] dilator  . Colonoscopy  03/10/2002    RMR: Incomplete colonoscopy (sigmoidoscopy)/Normal rectum/Normal colon to 40  cm.     Denies any headaches, dizziness, double vision, fevers, chills, night sweats, nausea, vomiting, diarrhea, constipation, chest pain, heart palpitations, shortness of breath, blood in stool, black tarry stool, urinary pain, urinary burning, urinary frequency, hematuria. 14 point review of systems was performed and is negative except as detailed under history of present illness and above    PHYSICAL EXAMINATION  ECOG PERFORMANCE STATUS: 0 - Asymptomatic  Filed Vitals:   09/26/15 1123  BP: 117/77  Pulse: 75  Temp: 98.3 F (36.8 C)  Resp: 18    GENERAL:alert, no distress, well nourished, well developed, comfortable, cooperative and smiling SKIN: skin color, texture, turgor are normal, no rashes or significant lesions HEAD: Normocephalic, No masses, lesions, tenderness or abnormalities EYES: normal, PERRLA, EOMI, Conjunctiva are pink and non-injected EARS: External ears normal OROPHARYNX:mucous membranes are moist  NECK: supple, no adenopathy, thyroid normal size, non-tender, without nodularity, no stridor, non-tender, trachea midline LYMPH:  no palpable lymphadenopathy, no hepatosplenomegaly BREAST:right breast normal without mass, skin or nipple changes or axillary nodes, left post-lumpectomy site well healed and free of suspicious changes LUNGS: clear to auscultation and percussion HEART: AFib, rate controlled, no murmur ABDOMEN:abdomen soft, non-tender, normal bowel sounds and no masses or organomegaly BACK: Back symmetric, no curvature., No CVA tenderness EXTREMITIES:less then 2 second capillary refill, no joint deformities, effusion, or inflammation, no edema, no skin discoloration, no clubbing, no cyanosis  NEURO: alert & oriented x 3 with fluent speech, no focal motor/sensory deficits, gait normal   LABORATORY DATA: I have reviewed the data as listed. CBC    Component Value Date/Time   WBC 8.5 06/06/2015 1000   RBC 4.29 06/06/2015 1000   HGB 12.6 06/06/2015 1000     HCT 39.8 06/06/2015 1000   PLT 285 06/06/2015 1000   MCV 92.8 06/06/2015 1000   MCH 29.4 06/06/2015 1000   MCHC 31.7 06/06/2015 1000   RDW 13.5 06/06/2015 1000   LYMPHSABS 2.8 06/06/2015 1000   MONOABS 0.7 06/06/2015 1000   EOSABS 0.1 06/06/2015 1000   BASOSABS 0.0 06/06/2015 1000      Chemistry      Component Value Date/Time   NA 139 06/06/2015 1000   K 4.4 06/06/2015 1000   CL 105 06/06/2015 1000   CO2 25 06/06/2015 1000   BUN 16 06/06/2015 1000   CREATININE 0.99 06/06/2015 1000      Component Value Date/Time   CALCIUM 9.1 06/06/2015 1000   ALKPHOS 51 06/06/2015 1000   AST 22 06/06/2015 1000   ALT 15 06/06/2015 1000   BILITOT 0.5 06/06/2015 1000       ASSESSMENT AND PLAN:  Stage IA invasive breast cancer L breast, ER positive, PR positive, HER-2/neu over amplified; Arimidex started 06/2013 Hot flashes secondary to above Normal Bone Density on DEXA 08/02/2013 Osteopenia on DEXA 07/2015   She is overall doing well. She is compliant with her Arimidex therapy. She states she takes calcium and vitamin D daily. She will be due again for screening mammogram in October and we will order this today.She will return in 6 months for ongoing follow-up.   She has evidence of osteopenia on DEXA. She is to continue calcium and vitamin D. Would recommend  prolia. In a two-year randomized trial of denosumab (60 mg subcutaneously every six months) versus placebo in 250 postmenopausal osteopenic (mean T-scores -0.88 to -1.33) women receiving adjuvant AI therapy, subjects in the denosumab group had significant increases in LS, TH, and femoral neck BMD compared with placebo (between group differences of 7.6, 4.7, and 3.6 percent, respectively)  There were no vertebral fractures, and nonvertebral fractures occurred in eight subjects in each group. The most common side effects included arthralgia, back pain, and fatigue.  If she decides to proceed with prolia will need dental exam prior (if  needed).   The patient will have her port flushed today.  She is scheduled for a mammogram in October 2017.   THERAPY PLAN:  NCCN guidelines recommends the following surveillance for invasive breast cancer:  A. History and Physical exam every 4-6 months for 5 years and then every 12 months.  B. Mammography every 12 months  C. Women on Tamoxifen: annual gynecologic assessment every 12 months if uterus is present.  D. Women on aromatase inhibitor or who experience ovarian failure secondary to treatment should have monitoring of bone health with a bone mineral density determination at baseline and periodically thereafter.  E. Assess and encourage adherence to adjuvant endocrine therapy.  F. Evidence suggests that active lifestyle and achieving and maintaining an ideal body weight (20-25 BMI) may lead to optimal breast cancer outcomes.  All questions were answered. The patient knows to call the clinic with any problems, questions or concerns. We can certainly see the patient much sooner if necessary.   This document serves as a record of services personally performed by Ancil Linsey, MD. It was created on her behalf by Arlyce Harman, a trained medical scribe. The creation of this record is based on the scribe's personal observations and the provider's statements to them. This document has been checked and approved by the attending provider.  I have reviewed the above documentation for accuracy and completeness and I agree with the above.  This note is electronically signed.  Kelby Fam. Whitney Muse, MD

## 2015-09-26 NOTE — Patient Instructions (Signed)
[place] at [facility] Discharge Instructions  RECOMMENDATIONS MADE BY THE CONSULTANT AND ANY TEST RESULTS WILL BE SENT TO YOUR REFERRING PHYSICIAN.  Port flush with labs today  Return to clinic in 6 months with labs  Thank you for choosing [place] at [facility] to provide your oncology and hematology care.  To afford each patient quality time with our provider, please arrive at least 15 minutes before your scheduled appointment time.   Beginning January 23rd 2017 lab work for the [facility] will be done in the  Main lab at [facility] on 1st floor. If you have a lab appointment with the [facility] please come in thru the  Main Entrance and check in at the main information desk  You need to re-schedule your appointment should you arrive 10 or more minutes late.  We strive to give you quality time with our providers, and arriving late affects you and other patients whose appointments are after yours.  Also, if you no show three or more times for appointments you may be dismissed from the clinic at the providers discretion.     Again, thank you for choosing [facility].  Our hope is that these requests will decrease the amount of time that you wait before being seen by our physicians.       _____________________________________________________________  Should you have questions after your visit to [facility], please contact our office at [phone] between the hours of 8:30 a.m. and 4:30 p.m.  Voicemails left after 4:30 p.m. will not be returned until the following business day.  For prescription refill requests, have your pharmacy contact our office.         Resources For Cancer Patients and their Caregivers ? American Cancer Society: Can assist with transportation, wigs, general needs, runs Look Good Feel Better.        ([phone] [phone] ? Cancer Care: Provides financial assistance, online support  groups, medication/co-pay assistance.  1-800-813-HOPE 410-780-8541) ? [place] Assists [place] cancer patients and their families through emotional , educational and financial support.  801-552-7589 ? [place] DSS Where to apply for food stamps, Medicaid and utility assistance. 305-745-3944 ? RCATS: Transportation to medical appointments. 323-071-8167 ? Social Security Administration: May apply for disability if have a Stage IV cancer. 405-757-9972 ([phone] 688-0150 ? [employer], Disability and Transit Services: Assists with nutrition, care and transit needs. [place] Support Programs: @10RELATIVEDAYS @ > Cancer Support Group  2nd Tuesday of the month 1pm-2pm, Journey Room  > Creative Journey  3rd Tuesday of the month 1130am-1pm, Journey Room  > Look Good Feel Better  1st Wednesday of the month 10am-12 noon, Journey Room (Call [place] to register [phone])

## 2015-09-26 NOTE — Progress Notes (Signed)
Stacie Huang Poster tolerated her port flush and blood draw with no complaints.

## 2015-09-28 ENCOUNTER — Encounter (HOSPITAL_COMMUNITY): Payer: Self-pay | Admitting: Hematology & Oncology

## 2015-10-03 ENCOUNTER — Other Ambulatory Visit (HOSPITAL_COMMUNITY): Payer: Self-pay | Admitting: Hematology & Oncology

## 2015-10-03 DIAGNOSIS — M8580 Other specified disorders of bone density and structure, unspecified site: Secondary | ICD-10-CM | POA: Insufficient documentation

## 2015-10-03 DIAGNOSIS — Z79899 Other long term (current) drug therapy: Secondary | ICD-10-CM | POA: Insufficient documentation

## 2015-10-03 HISTORY — DX: Other specified disorders of bone density and structure, unspecified site: M85.80

## 2015-10-04 ENCOUNTER — Encounter: Payer: Self-pay | Admitting: Internal Medicine

## 2015-10-08 ENCOUNTER — Encounter (HOSPITAL_COMMUNITY): Payer: Self-pay

## 2015-10-08 ENCOUNTER — Encounter (HOSPITAL_COMMUNITY): Payer: Medicare Other | Attending: Hematology and Oncology

## 2015-10-08 VITALS — BP 112/65 | HR 84 | Temp 98.0°F | Resp 18

## 2015-10-08 DIAGNOSIS — M8580 Other specified disorders of bone density and structure, unspecified site: Secondary | ICD-10-CM

## 2015-10-08 DIAGNOSIS — Z79899 Other long term (current) drug therapy: Secondary | ICD-10-CM

## 2015-10-08 DIAGNOSIS — C50212 Malignant neoplasm of upper-inner quadrant of left female breast: Secondary | ICD-10-CM | POA: Insufficient documentation

## 2015-10-08 MED ORDER — DENOSUMAB 60 MG/ML ~~LOC~~ SOLN
60.0000 mg | Freq: Once | SUBCUTANEOUS | Status: AC
  Administered 2015-10-08: 60 mg via SUBCUTANEOUS
  Filled 2015-10-08: qty 1

## 2015-10-08 NOTE — Progress Notes (Signed)
Stacie Huang presents today for injection per the provider's orders.  Prolia administration without incident; see MAR for injection details.  Patient tolerated procedure well and without incident.  No questions or complaints noted at this time.

## 2015-10-08 NOTE — Patient Instructions (Signed)
[place] at [facility] Discharge Instructions  RECOMMENDATIONS MADE BY THE CONSULTANT AND ANY TEST RESULTS WILL BE SENT TO YOUR REFERRING PHYSICIAN.  prolia injection today prolia every 6 months Follow up as scheduled   Talking points: PROLIA   These medications can cause osteonecrosis of the jaw which symptomatically can present as jaw pain/tenderness.  If you develop jaw pain/tenderness/soreness, a nurse at the [facility] be contacted.  [place] may go to the dentist to have your teeth cleaned or to have a filling without notifying us.  However, if you require more dental work than this you MUST contact a nurse at the [facility] prior to making your dental appointment.  This is for your safety!  You MUST notify your dental hygienist/dentist that you are on this medication.   You will need to take calcium 1200mg  and vitamin D 1000units every day.  You can take a [place] with D that is equivalent to this dose or take separate tablets to equal this dosage.         Thank you for choosing [place] at [facility] to provide your oncology and hematology care.  To afford each patient quality time with our provider, please arrive at least 15 minutes before your scheduled appointment time.   Beginning January 23rd 2017 lab work for the [facility] will be done in the  Main lab at [facility] on 1st floor. If you have a lab appointment with the [place] [facility] please come in thru the  Main Entrance and check in at the main information desk  You need to re-schedule your appointment should you arrive 10 or more minutes late.  We strive to give you quality time with our providers, and arriving late affects you and other patients whose appointments are after yours.  Also, if you no show three or more times for appointments you may be dismissed from the clinic at the providers discretion.     Again, thank you for choosing [facility].  Our hope is that these requests will decrease the amount of time that you wait before being seen by our physicians.       _____________________________________________________________  Should you have questions after your visit to [facility], please contact our office at [phone] between the hours of 8:30 a.m. and 4:30 p.m.  Voicemails left after 4:30 p.m. will not be returned until the following business day.  For prescription refill requests, have your pharmacy contact our office.         Resources For Cancer Patients and their Caregivers ? American Cancer Society: Can assist with transportation, wigs, general needs, runs Look Good Feel Better.        802-248-0088 ? Cancer Care: Provides financial assistance, online support groups, medication/co-pay assistance.  1-800-813-HOPE 249-720-0653) ? [place] Assists [place] cancer patients and their families through emotional , educational and financial support.  (671)751-4646 ? [place] DSS Where to apply for food stamps, Medicaid and utility assistance. 804 061 1841 ? RCATS: Transportation to medical appointments. 705-246-4223 ? Social Security Administration: May apply for disability if have a Stage IV cancer. 661 091 5909 [phone]) [phone] ? [employer], Disability and Transit Services: Assists with nutrition, care and transit needs. [place] Support Programs: @10RELATIVEDAYS @ > Cancer Support Group  2nd Tuesday of the month 1pm-2pm, Journey Room  > Creative Journey  3rd Tuesday of the month 1130am-1pm, Journey  Room  > Look Good Feel Better  1st Wednesday of the month 10am-12 noon, Journey Room (Call [place] to register [phone])

## 2015-10-15 ENCOUNTER — Ambulatory Visit (INDEPENDENT_AMBULATORY_CARE_PROVIDER_SITE_OTHER): Payer: Medicare Other | Admitting: *Deleted

## 2015-10-15 DIAGNOSIS — Z5181 Encounter for therapeutic drug level monitoring: Secondary | ICD-10-CM

## 2015-10-15 DIAGNOSIS — I4891 Unspecified atrial fibrillation: Secondary | ICD-10-CM

## 2015-10-15 LAB — POCT INR: INR: 3

## 2015-10-22 ENCOUNTER — Other Ambulatory Visit (HOSPITAL_COMMUNITY): Payer: Self-pay | Admitting: Oncology

## 2015-11-19 ENCOUNTER — Ambulatory Visit (INDEPENDENT_AMBULATORY_CARE_PROVIDER_SITE_OTHER): Payer: Medicare Other | Admitting: *Deleted

## 2015-11-19 DIAGNOSIS — Z5181 Encounter for therapeutic drug level monitoring: Secondary | ICD-10-CM

## 2015-11-19 DIAGNOSIS — I4891 Unspecified atrial fibrillation: Secondary | ICD-10-CM

## 2015-11-19 LAB — POCT INR: INR: 2.4

## 2015-11-20 ENCOUNTER — Ambulatory Visit (INDEPENDENT_AMBULATORY_CARE_PROVIDER_SITE_OTHER): Payer: Medicare Other | Admitting: Gastroenterology

## 2015-11-20 ENCOUNTER — Encounter: Payer: Self-pay | Admitting: Gastroenterology

## 2015-11-20 VITALS — BP 105/62 | HR 116 | Temp 97.4°F | Ht 62.0 in | Wt 141.8 lb

## 2015-11-20 DIAGNOSIS — K219 Gastro-esophageal reflux disease without esophagitis: Secondary | ICD-10-CM | POA: Diagnosis not present

## 2015-11-20 NOTE — Progress Notes (Signed)
cc'ed to pcp °

## 2015-11-20 NOTE — Progress Notes (Signed)
Primary Care Physician: [name], MD  Primary Gastroenterologist:  Garfield Cornea, MD   Chief Complaint  Patient presents with  . Follow-up    reflux doing well    HPI: Stacie Huang is a 75 y.o. female here for follow-up of GERD. Last seen in July 2016.  Last colonoscopy in 2003 which was incomplete due to noncompliant colon. Evaluation complete up to 40 cm only. An air-contrast barium enema done to complete evaluation, unremarkable. EGD at the same time showed a tiny AVM in the antrum, empiric dilation of esophagus performed for history of dysphagia.  Feels well. Reflux well controlled as long as she takes pantoprazole twice daily. She was having significant breakthrough when she was on once daily dosing. Denies dysphagia. No abdominal pain. Bowel movements are regular. No blood in stool or melena. She is having a little bit of gas. She states "Old people get gassy".  Patient declines a colonoscopy.  Current Outpatient Prescriptions  Medication Sig Dispense Refill  . acetaminophen (TYLENOL) 500 MG tablet Take 1,000 mg by mouth daily.    Marland Kitchen anastrozole (ARIMIDEX) 1 MG tablet TAKE ONE TABLET BY MOUTH DAILY. 30 tablet 5  . atenolol (TENORMIN) 100 MG tablet Take 50 mg by mouth daily before breakfast.     . calcium carbonate (OS-CAL) 600 MG TABS tablet Take 600 mg by mouth 2 (two) times daily with a meal.     . cetirizine (ZYRTEC) 10 MG tablet Take 10 mg by mouth every morning.     . Cholecalciferol (VITAMIN D) 400 UNITS capsule Take 400 Units by mouth 3 (three) times daily.     Marland Kitchen denosumab (PROLIA) 60 MG/ML SOLN injection Inject 60 mg into the skin every 6 (six) months. Administer in upper arm, thigh, or abdomen    . diltiazem (CARDIZEM CD) 120 MG 24 hr capsule TAKE 1 CAPSULE BY MOUTH EVERY DAY. 90 capsule 3  . fluticasone (FLONASE) 50 MCG/ACT nasal spray Place 2 sprays into the nose at bedtime.     Marland Kitchen LORazepam (ATIVAN) 1 MG tablet Take 1 mg by mouth daily as needed for  anxiety (AND/OR FOR SLEEP).     Marland Kitchen losartan (COZAAR) 50 MG tablet Take 50 mg by mouth every morning.     . metoCLOPramide (REGLAN) 10 MG tablet TAKE 1 TABLET BY MOUTH EVERY 8 HOURS AS NEEDED FOR NAUSEA. 120 tablet 0  . pantoprazole (PROTONIX) 40 MG tablet TAKE (1) TABLET BY MOUTH TWICE DAILY. 60 tablet 5  . psyllium (METAMUCIL) 58.6 % powder Take 1 packet by mouth daily.     . simvastatin (ZOCOR) 10 MG tablet Take 10 mg by mouth at bedtime.      Marland Kitchen warfarin (COUMADIN) 2.5 MG tablet TAKE ONE TABLET BY MOUTH ON TUESDAYS AND FRIDAYS; TAKE 1/2 TABLET ON ALL OTHER DAYS. 30 tablet 4   No current facility-administered medications for this visit.     Allergies as of 11/20/2015 - Review Complete 11/20/2015  Allergen Reaction Noted  . Iohexol Hives 03/17/2008  . Sulfa antibiotics Rash 05/26/2011    ROS:  General: Negative for anorexia, weight loss, fever, chills, fatigue, weakness. ENT: Negative for hoarseness, difficulty swallowing , nasal congestion. CV: Negative for chest pain, angina, palpitations, dyspnea on exertion, peripheral edema.  Respiratory: Negative for dyspnea at rest, dyspnea on exertion, cough, sputum, wheezing.  GI: See history of present illness. GU:  Negative for dysuria, hematuria, urinary incontinence, urinary frequency, nocturnal urination.  Endo: Negative for unusual  weight change.    Physical Examination:   BP 105/62   Pulse (!) 116   Temp 97.4 F (36.3 C) (Oral)   Ht 5\' 2"  (1.575 m)   Wt 141 lb 12.8 oz (64.3 kg)   BMI 25.94 kg/m   General: Well-nourished, well-developed in no acute distress.  Eyes: No icterus. Mouth: Oropharyngeal mucosa moist and pink , no lesions erythema or exudate. Lungs: Clear to auscultation bilaterally.  Heart: Regular rate and rhythm, no murmurs rubs or gallops.  Abdomen: Bowel sounds are normal, nontender, nondistended, no hepatosplenomegaly or masses, no abdominal bruits or hernia , no rebound or guarding.   Extremities: No lower  extremity edema. No clubbing or deformities. Neuro: Alert and oriented x 4   Skin: Warm and dry, no jaundice.   Psych: Alert and cooperative, normal mood and affect.  Labs:  Lab Results  Component Value Date   CREATININE 0.92 09/26/2015   BUN 15 09/26/2015   NA 137 09/26/2015   K 4.2 09/26/2015   CL 104 09/26/2015   CO2 25 09/26/2015   Lab Results  Component Value Date   ALT 15 09/26/2015   AST 22 09/26/2015   ALKPHOS 51 09/26/2015   BILITOT 0.6 09/26/2015   Lab Results  Component Value Date   WBC 7.9 09/26/2015   HGB 12.6 09/26/2015   HCT 39.0 09/26/2015   MCV 92.2 09/26/2015   PLT 277 09/26/2015    Imaging Studies: No results found.

## 2015-11-20 NOTE — Patient Instructions (Signed)
1. Continue pantoprazole one pill twice daily before meals.  2. Trial of probiotics for gas. Samples of Align provided today. Take once daily for 1-2 months.  3. Return to the office in one year or call sooner if needed.

## 2015-11-20 NOTE — Assessment & Plan Note (Signed)
Doing very well. She was unable to tolerate once daily pantoprazole. We'll continue twice a day dosing. Trial of probiotics for gas. Samples provided. Come back in one year or sooner if needed.

## 2015-11-21 ENCOUNTER — Encounter (HOSPITAL_COMMUNITY): Payer: Medicare Other | Attending: Hematology and Oncology

## 2015-11-21 VITALS — BP 103/68 | HR 71 | Temp 98.1°F | Resp 18

## 2015-11-21 DIAGNOSIS — Z452 Encounter for adjustment and management of vascular access device: Secondary | ICD-10-CM

## 2015-11-21 DIAGNOSIS — C50212 Malignant neoplasm of upper-inner quadrant of left female breast: Secondary | ICD-10-CM

## 2015-11-21 DIAGNOSIS — Z95828 Presence of other vascular implants and grafts: Secondary | ICD-10-CM

## 2015-11-21 MED ORDER — SODIUM CHLORIDE 0.9% FLUSH
10.0000 mL | Freq: Once | INTRAVENOUS | Status: AC
  Administered 2015-11-21: 10 mL via INTRAVENOUS

## 2015-11-21 MED ORDER — HEPARIN SOD (PORK) LOCK FLUSH 100 UNIT/ML IV SOLN
500.0000 [IU] | Freq: Once | INTRAVENOUS | Status: AC
  Administered 2015-11-21: 500 [IU] via INTRAVENOUS
  Filled 2015-11-21: qty 5

## 2015-11-21 NOTE — Patient Instructions (Signed)
[facility] at [facility] Discharge Instructions  RECOMMENDATIONS MADE BY THE CONSULTANT AND ANY TEST RESULTS WILL BE SENT TO YOUR REFERRING PHYSICIAN.  Port flush today as ordered. Return as scheduled.  Thank you for choosing [facility] at [facility] to provide your oncology and hematology care.  To afford each patient quality time with our provider, please arrive at least 15 minutes before your scheduled appointment time.   Beginning January 23rd 2017 lab work for the Cancer Center will be done in the  Main lab at [place] on 1st floor. If you have a lab appointment with the Cancer Center please come in thru the  Main Entrance and check in at the main information desk  You need to re-schedule your appointment should you arrive 10 or more minutes late.  We strive to give you quality time with our providers, and arriving late affects you and other patients whose appointments are after yours.  Also, if you no show three or more times for appointments you may be dismissed from the clinic at the providers discretion.     Again, thank you for choosing [facility].  Our hope is that these requests will decrease the amount of time that you wait before being seen by our physicians.       _____________________________________________________________  Should you have questions after your visit to [facility], please contact our office at [phone] between the hours of 8:30 a.m. and 4:30 p.m.  Voicemails left after 4:30 p.m. will not be returned until the following business day.  For prescription refill requests, have your pharmacy contact our office.         Resources For Cancer Patients and their Caregivers ? American Cancer Society: Can assist with transportation, wigs, general needs, runs Look Good Feel Better.        1-888-227-6333 ? Cancer Care: Provides financial assistance, online support groups,  medication/co-pay assistance.  1-800-813-HOPE (4673) ? [facility] Assists [place] cancer patients and their families through emotional , educational and financial support.  336-427-4357 ? [place] DSS Where to apply for food stamps, Medicaid and utility assistance. 336-342-1394 ? RCATS: Transportation to medical appointments. 336-347-2287 ? Social Security Administration: May apply for disability if have a Stage IV cancer. 336-342-7796 1-800-772-1213 ? [place] Aging, Disability and Transit Services: Assists with nutrition, care and transit needs. 336-349-2343  Cancer Center Support Programs: @10RELATIVEDAYS@ > Cancer Support Group  2nd Tuesday of the month 1pm-2pm, Journey Room  > Creative Journey  3rd Tuesday of the month 1130am-1pm, Journey Room  > Look Good Feel Better  1st Wednesday of the month 10am-12 noon, Journey Room (Call American Cancer Society to register 1-800-395-5775)   

## 2015-11-21 NOTE — Progress Notes (Signed)
Berlyn A Walsh presented for Portacath access and flush. Proper placement of portacath confirmed by CXR. Portacath located right chest wall accessed with  H 20 needle. Good blood return present. Portacath flushed with 20ml NS and 500U/5ml Heparin and needle removed intact. Procedure without incident. Patient tolerated procedure well.   

## 2015-11-29 ENCOUNTER — Telehealth: Payer: Self-pay | Admitting: *Deleted

## 2015-11-29 NOTE — Telephone Encounter (Signed)
Was started on Z Pack yesterday for congestion.  Told pt to continue current coumadin dose except to take 1/2 tablet tomorrow (1.25mg ) instead of a whole tablet (2.5mg ).  Eat extra Greens next several days.  Pt verbalized understanding.

## 2015-11-29 NOTE — Telephone Encounter (Signed)
Mrs. Prevett called requesting to speak with Edrick Oh, RN

## 2015-12-10 ENCOUNTER — Other Ambulatory Visit (HOSPITAL_COMMUNITY): Payer: Self-pay | Admitting: Oncology

## 2015-12-24 ENCOUNTER — Ambulatory Visit (INDEPENDENT_AMBULATORY_CARE_PROVIDER_SITE_OTHER): Payer: Medicare Other | Admitting: *Deleted

## 2015-12-24 DIAGNOSIS — Z5181 Encounter for therapeutic drug level monitoring: Secondary | ICD-10-CM

## 2015-12-24 DIAGNOSIS — I4891 Unspecified atrial fibrillation: Secondary | ICD-10-CM | POA: Diagnosis not present

## 2015-12-24 LAB — POCT INR: INR: 2.7

## 2016-01-16 ENCOUNTER — Encounter (HOSPITAL_COMMUNITY): Payer: Medicare Other | Attending: Hematology and Oncology

## 2016-01-16 ENCOUNTER — Encounter (HOSPITAL_COMMUNITY): Payer: Self-pay

## 2016-01-16 VITALS — BP 114/75 | HR 95 | Temp 98.2°F | Resp 18

## 2016-01-16 DIAGNOSIS — C50212 Malignant neoplasm of upper-inner quadrant of left female breast: Secondary | ICD-10-CM | POA: Insufficient documentation

## 2016-01-16 DIAGNOSIS — Z23 Encounter for immunization: Secondary | ICD-10-CM

## 2016-01-16 DIAGNOSIS — Z95828 Presence of other vascular implants and grafts: Secondary | ICD-10-CM

## 2016-01-16 MED ORDER — HEPARIN SOD (PORK) LOCK FLUSH 100 UNIT/ML IV SOLN
500.0000 [IU] | Freq: Once | INTRAVENOUS | Status: AC
  Administered 2016-01-16: 500 [IU] via INTRAVENOUS
  Filled 2016-01-16: qty 5

## 2016-01-16 MED ORDER — INFLUENZA VAC SPLIT QUAD 0.5 ML IM SUSY
0.5000 mL | PREFILLED_SYRINGE | Freq: Once | INTRAMUSCULAR | Status: AC
  Administered 2016-01-16: 0.5 mL via INTRAMUSCULAR
  Filled 2016-01-16: qty 0.5

## 2016-01-16 MED ORDER — SODIUM CHLORIDE 0.9% FLUSH
10.0000 mL | INTRAVENOUS | Status: DC | PRN
  Administered 2016-01-16: 10 mL via INTRAVENOUS
  Filled 2016-01-16: qty 10

## 2016-01-16 NOTE — Patient Instructions (Signed)
[place] at [facility] Discharge Instructions  RECOMMENDATIONS MADE BY THE CONSULTANT AND ANY TEST RESULTS WILL BE SENT TO YOUR REFERRING PHYSICIAN.  Port flush done today. Flu shot given today. Follow up as scheduled  Thank you for choosing [place] at [facility] to provide your oncology and hematology care.  To afford each patient quality time with our provider, please arrive at least 15 minutes before your scheduled appointment time.   Beginning January 23rd 2017 lab work for the [facility] will be done in the  Main lab at [facility] on 1st floor. If you have a lab appointment with the [place] please come in thru the  Main Entrance and check in at the main information desk  You need to re-schedule your appointment should you arrive 10 or more minutes late.  We strive to give you quality time with our providers, and arriving late affects you and other patients whose appointments are after yours.  Also, if you no show three or more times for appointments you may be dismissed from the clinic at the providers discretion.     Again, thank you for choosing [facility].  Our hope is that these requests will decrease the amount of time that you wait before being seen by our physicians.       _____________________________________________________________  Should you have questions after your visit to [facility], please contact our office at [phone] between the hours of 8:30 a.m. and 4:30 p.m.  Voicemails left after 4:30 p.m. will not be returned until the following business day.  For prescription refill requests, have your pharmacy contact our office.         Resources For Cancer Patients and their Caregivers ? American Cancer Society: Can assist with transportation, wigs, general needs, runs Look Good Feel Better.        307-131-7454 ? Cancer Care: Provides financial assistance, online support  groups, medication/co-pay assistance.  1-800-813-HOPE 531 593 8409) ? [facility] Assists [place] cancer patients and their families through emotional , educational and financial support.  603-830-6252 ? [place] DSS Where to apply for food stamps, Medicaid and utility assistance. 437-540-3697 ? RCATS: Transportation to medical appointments. 715-392-3836 ? Social Security Administration: May apply for disability if have a Stage IV cancer. 226-673-3201 848-703-5373 ? [employer], Disability and Transit Services: Assists with nutrition, care and transit needs. [place] Support Programs: @10RELATIVEDAYS @ > Cancer Support Group  2nd Tuesday of the month 1pm-2pm, Journey Room  > Creative Journey  3rd Tuesday of the month 1130am-1pm, Journey Room  > Look Good Feel Better  1st Wednesday of the month 10am-12 noon, Journey Room (Call [place] to register [phone])

## 2016-01-16 NOTE — Progress Notes (Signed)
Stacie Huang presented for Portacath access and flush. Portacath located right chest wall accessed with  H 20 needle. Good blood return present. Portacath flushed with 11ml NS and 500U/69ml Heparin and needle removed intact. Procedure without incident. Patient tolerated procedure well.

## 2016-01-21 ENCOUNTER — Other Ambulatory Visit (HOSPITAL_COMMUNITY): Payer: Self-pay | Admitting: Oncology

## 2016-01-29 ENCOUNTER — Other Ambulatory Visit (HOSPITAL_COMMUNITY): Payer: Self-pay | Admitting: Oncology

## 2016-01-30 ENCOUNTER — Other Ambulatory Visit (HOSPITAL_COMMUNITY): Payer: Self-pay | Admitting: Oncology

## 2016-02-04 ENCOUNTER — Ambulatory Visit (INDEPENDENT_AMBULATORY_CARE_PROVIDER_SITE_OTHER): Payer: Medicare Other | Admitting: *Deleted

## 2016-02-04 DIAGNOSIS — I4891 Unspecified atrial fibrillation: Secondary | ICD-10-CM

## 2016-02-04 DIAGNOSIS — Z5181 Encounter for therapeutic drug level monitoring: Secondary | ICD-10-CM

## 2016-02-04 LAB — POCT INR: INR: 2.8

## 2016-02-26 ENCOUNTER — Ambulatory Visit (HOSPITAL_COMMUNITY)
Admission: RE | Admit: 2016-02-26 | Discharge: 2016-02-26 | Disposition: A | Discharge status: Home / Self Care | Payer: Medicare Other | Source: Ambulatory Visit | Attending: Oncology | Admitting: Oncology

## 2016-02-26 DIAGNOSIS — N6489 Other specified disorders of breast: Secondary | ICD-10-CM | POA: Diagnosis not present

## 2016-02-26 DIAGNOSIS — C50212 Malignant neoplasm of upper-inner quadrant of left female breast: Secondary | ICD-10-CM | POA: Insufficient documentation

## 2016-02-26 DIAGNOSIS — R921 Mammographic calcification found on diagnostic imaging of breast: Secondary | ICD-10-CM | POA: Diagnosis not present

## 2016-02-27 ENCOUNTER — Other Ambulatory Visit (HOSPITAL_COMMUNITY): Payer: Self-pay | Admitting: Oncology

## 2016-02-27 DIAGNOSIS — R921 Mammographic calcification found on diagnostic imaging of breast: Secondary | ICD-10-CM

## 2016-02-27 DIAGNOSIS — N6489 Other specified disorders of breast: Secondary | ICD-10-CM

## 2016-02-28 ENCOUNTER — Other Ambulatory Visit (HOSPITAL_COMMUNITY): Payer: Self-pay | Admitting: Oncology

## 2016-02-28 DIAGNOSIS — N6489 Other specified disorders of breast: Secondary | ICD-10-CM

## 2016-02-28 DIAGNOSIS — R921 Mammographic calcification found on diagnostic imaging of breast: Secondary | ICD-10-CM

## 2016-03-05 ENCOUNTER — Other Ambulatory Visit: Payer: Self-pay

## 2016-03-05 DIAGNOSIS — R921 Mammographic calcification found on diagnostic imaging of breast: Secondary | ICD-10-CM

## 2016-03-05 DIAGNOSIS — N6489 Other specified disorders of breast: Secondary | ICD-10-CM

## 2016-03-06 ENCOUNTER — Other Ambulatory Visit (HOSPITAL_COMMUNITY): Payer: Self-pay | Admitting: Oncology

## 2016-03-06 ENCOUNTER — Other Ambulatory Visit (HOSPITAL_COMMUNITY): Payer: Self-pay | Admitting: Emergency Medicine

## 2016-03-06 ENCOUNTER — Ambulatory Visit
Admission: RE | Admit: 2016-03-06 | Discharge: 2016-03-06 | Disposition: A | Discharge status: Home / Self Care | Payer: Medicare Other | Source: Ambulatory Visit

## 2016-03-06 ENCOUNTER — Ambulatory Visit
Admission: RE | Admit: 2016-03-06 | Discharge: 2016-03-06 | Disposition: A | Discharge status: Home / Self Care | Payer: Medicare Other | Source: Ambulatory Visit | Attending: Oncology | Admitting: Oncology

## 2016-03-06 DIAGNOSIS — N6489 Other specified disorders of breast: Secondary | ICD-10-CM

## 2016-03-06 DIAGNOSIS — R921 Mammographic calcification found on diagnostic imaging of breast: Secondary | ICD-10-CM

## 2016-03-12 ENCOUNTER — Encounter (HOSPITAL_BASED_OUTPATIENT_CLINIC_OR_DEPARTMENT_OTHER): Payer: Medicare Other | Admitting: Hematology & Oncology

## 2016-03-12 ENCOUNTER — Encounter (HOSPITAL_COMMUNITY): Payer: Medicare Other | Attending: Hematology and Oncology

## 2016-03-12 ENCOUNTER — Encounter (HOSPITAL_COMMUNITY): Payer: Self-pay | Admitting: Hematology & Oncology

## 2016-03-12 VITALS — BP 115/70 | HR 98 | Temp 98.2°F | Resp 16 | Wt 144.3 lb

## 2016-03-12 DIAGNOSIS — C50411 Malignant neoplasm of upper-outer quadrant of right female breast: Secondary | ICD-10-CM

## 2016-03-12 DIAGNOSIS — C50212 Malignant neoplasm of upper-inner quadrant of left female breast: Secondary | ICD-10-CM

## 2016-03-12 DIAGNOSIS — M8580 Other specified disorders of bone density and structure, unspecified site: Secondary | ICD-10-CM | POA: Diagnosis not present

## 2016-03-12 DIAGNOSIS — D0511 Intraductal carcinoma in situ of right breast: Secondary | ICD-10-CM | POA: Diagnosis present

## 2016-03-12 DIAGNOSIS — I4891 Unspecified atrial fibrillation: Secondary | ICD-10-CM

## 2016-03-12 DIAGNOSIS — N951 Menopausal and female climacteric states: Secondary | ICD-10-CM | POA: Diagnosis not present

## 2016-03-12 DIAGNOSIS — Z79811 Long term (current) use of aromatase inhibitors: Secondary | ICD-10-CM

## 2016-03-12 DIAGNOSIS — Z171 Estrogen receptor negative status [ER-]: Secondary | ICD-10-CM

## 2016-03-12 LAB — COMPREHENSIVE METABOLIC PANEL
ALT: 13 U/L — AB (ref 14–54)
AST: 19 U/L (ref 15–41)
Albumin: 3.9 g/dL (ref 3.5–5.0)
Alkaline Phosphatase: 37 U/L — ABNORMAL LOW (ref 38–126)
Anion gap: 5 (ref 5–15)
BILIRUBIN TOTAL: 0.4 mg/dL (ref 0.3–1.2)
BUN: 15 mg/dL (ref 6–20)
CO2: 25 mmol/L (ref 22–32)
CREATININE: 1.02 mg/dL — AB (ref 0.44–1.00)
Calcium: 8.8 mg/dL — ABNORMAL LOW (ref 8.9–10.3)
Chloride: 104 mmol/L (ref 101–111)
GFR calc Af Amer: >60 mL/min (ref >60)
GFR, EST NON AFRICAN AMERICAN: 52 mL/min — AB (ref >60)
Glucose, Bld: 108 mg/dL — ABNORMAL HIGH (ref 65–99)
Potassium: 3.6 mmol/L (ref 3.5–5.1)
Sodium: 134 mmol/L — ABNORMAL LOW (ref 135–145)
TOTAL PROTEIN: 6.6 g/dL (ref 6.5–8.1)

## 2016-03-12 LAB — CBC WITH DIFFERENTIAL/PLATELET
BASOS ABS: 0 10*3/uL (ref 0.0–0.1)
Basophils Relative: 0 %
Eosinophils Absolute: 0.1 10*3/uL (ref 0.0–0.7)
Eosinophils Relative: 1 %
HEMATOCRIT: 37.9 % (ref 36.0–46.0)
Hemoglobin: 12 g/dL (ref 12.0–15.0)
LYMPHS PCT: 44 %
Lymphs Abs: 3 10*3/uL (ref 0.7–4.0)
MCH: 29.6 pg (ref 26.0–34.0)
MCHC: 31.7 g/dL (ref 30.0–36.0)
MCV: 93.3 fL (ref 78.0–100.0)
MONO ABS: 0.8 10*3/uL (ref 0.1–1.0)
Monocytes Relative: 11 %
NEUTROS ABS: 3.1 10*3/uL (ref 1.7–7.7)
Neutrophils Relative %: 44 %
Platelets: 307 10*3/uL (ref 150–400)
RBC: 4.06 MIL/uL (ref 3.87–5.11)
RDW: 14 % (ref 11.5–15.5)
WBC: 7 10*3/uL (ref 4.0–10.5)

## 2016-03-12 MED ORDER — HEPARIN SOD (PORK) LOCK FLUSH 100 UNIT/ML IV SOLN
500.0000 [IU] | Freq: Once | INTRAVENOUS | Status: AC
  Administered 2016-03-12: 500 [IU] via INTRAVENOUS
  Filled 2016-03-12: qty 5

## 2016-03-12 MED ORDER — SODIUM CHLORIDE 0.9% FLUSH
10.0000 mL | INTRAVENOUS | Status: DC | PRN
  Administered 2016-03-12: 10 mL via INTRAVENOUS
  Filled 2016-03-12: qty 10

## 2016-03-12 NOTE — Progress Notes (Signed)
PROGRESS NOTE      Stacie Huang, [place] [address]  Malignant neoplasm of upper-inner quadrant of left female breast, unspecified estrogen receptor status ([place]) - Plan: CBC with Differential, Comprehensive metabolic panel, CBC with Differential, Comprehensive metabolic panel   Breast cancer of upper-inner quadrant of left female breast ([place])   02/04/2013 Initial Diagnosis    Breast cancer of upper-inner quadrant of left female breast      02/23/2013 Surgery    left lumpectomy (UIQ), sentinel node biopsy--0.7cm, Node negative, ER+, PR+, HER-2/neu over expressed.      04/08/2013 - 04/14/2013 Chemotherapy    Paclitaxel/Herceptin weekly x 12. Intolerance to Paclitaxel and only receiving 2 cycles      05/04/2013 - 04/05/2014 Chemotherapy    Herceptin every 21 days       06/28/2013 -  Chemotherapy    Anastrazole started      08/02/2013 Imaging    Bone density- normal      04/06/2014 Imaging    MUGA- Left ventricular ejection fraction equals 53%.      08/06/2015 Imaging    Bone density- osteopenia      03/06/2016 Procedure    Stereotactic-guided biopsy of right breast upper outer quadrant focal asymmetry/calcifications. No apparent complications.      03/06/2016 Procedure    Successful placement of coil shaped marker within the right breast upper outer quadrant biopsy site, post stereotactic core needle biopsy.       CURRENT THERAPY: Anastrozole 1 mg daily beginning on 06/28/2013  INTERVAL HISTORY: Stacie Huang 75 y.o. 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 Now, S/P 12 months worth of Herceptin therapy every 3 weeks. Currently on Anastrozole started on 06/28/2013. Stacie Huang is unaccompanied.  Unfortunately mammogram in october showed assymetry on the UO quadrant of the R breast  with calcs. 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 Stereotactic biopsy is ER- PR-. She has an appointment with Dr. 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   She continues to tolerate endocrine therapy well.  She notes mild hot flashes and reports that they do not interfere with QOL.  She denies any arthralgias or myalgias that are new or worse since starting Anastrozole.    I have reviewed the labs with the patient.  She received her flu shot last month.   She states that her appetite is good.   She denies any problems with her bowels. "I'm pooping good".  She states that she would like a lumpectomy rather than a mastectomy unless she has to.  She questions if she should continue current endocrine therapy.   Mood is ok.   Past Medical History:  Diagnosis Date  . Breast cancer ([place])   . Cancer ([place])   . Chronic anticoagulation   . GERD (gastroesophageal reflux disease)   . Hyperlipidemia   . Hypertension   . Paroxysmal atrial fibrillation (H[facility])     has Hyperlipidemia; Chronic anticoagulation; Atrial fibrillation ([place]); Hypertension; Vitamin D deficiency; Breast cancer of upper-inner quadrant of left female breast ([place]); Paroxysmal atrial fibrillation ([place]); Encounter for therapeutic drug monitoring; GERD (gastroesophageal reflux disease); Bloating; Colon cancer screening; High risk medication use; and Osteopenia determined by x-ray on her problem list.     is allergic to iohexol and sulfa  antibiotics.  We administered heparin lock flush and sodium chloride.  Past Surgical History:  Procedure Laterality Date  . ABDOMINAL HYSTERECTOMY    . AXILLARY LYMPH NODE DISSECTION Left 02/23/2013   Procedure: LEFT AXILLARY LYMPH NODE DISSECTION;  Surgeon: Scherry Ran, MD;  Location: [facility];  Service: General;  Laterality: Left;  . CHOLECYSTECTOMY    . COLONOSCOPY  03/10/2002   RMR: Incomplete colonoscopy (sigmoidoscopy)/Normal  rectum/Normal colon to 40 cm.   . ESOPHAGOGASTRODUODENOSCOPY  03/10/2002   VFM:BBUYZJ esophagus/small HH/Tiny AVM in antrum, otherwise normal stomach and D1 and D2/Status post passage of the [facility] dilator  . PARTIAL MASTECTOMY WITH AXILLARY SENTINEL LYMPH NODE BIOPSY Left 02/23/2013   Procedure: LEFT PARTIAL MASTECTOMY WITH AXILLARY SIMPLE NODE BIOPSY, LEFT BREAST WIDE EXCISION;  Surgeon: Scherry Ran, MD;  Location: [facility];  Service: General;  Laterality: Left;  . PORTACATH PLACEMENT Right 04/07/2013  . PORTACATH PLACEMENT Right 04/07/2013   Procedure: INSERTION PORT-A-CATH;  Surgeon: Scherry Ran, MD;  Location: [facility];  Service: General;  Laterality: Right;   Review of Systems  Constitutional: Negative.        Appetite is good.   HENT: Negative.   Eyes: Negative.   Respiratory: Negative.   Cardiovascular: Negative.   Gastrointestinal: Negative.  Negative for blood in stool, constipation and diarrhea.  Genitourinary: Negative.   Musculoskeletal: Negative.   Skin: Negative.   Neurological: Negative.   Endo/Heme/Allergies: Negative.   Psychiatric/Behavioral: Negative.   All other systems reviewed and are negative.  14 point review of systems was performed and is negative except as detailed under history of present illness and above    PHYSICAL EXAMINATION  ECOG PERFORMANCE STATUS: 0 - Asymptomatic  Vitals:   03/12/16 1340  BP: 115/70  Pulse: 98  Resp: 16  Temp: 98.2 F (36.8 C)      Physical Exam  Constitutional: She is oriented to person, place, and time and well-developed, well-nourished, and in no distress.  HENT:  Head: Normocephalic and atraumatic.  Mouth/Throat: No oropharyngeal exudate.  Eyes: EOM are normal. Pupils are equal, round, and reactive to light. No scleral icterus.  Neck: Normal range of motion. Neck supple.  Cardiovascular:  Atrial fibrillation   Pulmonary/Chest: Effort normal and breath sounds normal. No respiratory distress. She  has no wheezes.  Abdominal: Soft. Bowel sounds are normal. She exhibits no distension and no mass. There is no tenderness. There is no rebound and no guarding.  Musculoskeletal: Normal range of motion.  Lymphadenopathy:    She has no cervical adenopathy.  Neurological: She is alert and oriented to person, place, and time. Gait normal.  Skin: Skin is warm and dry.  Psychiatric: Mood, memory, affect and judgment normal.  Nursing note and vitals reviewed. Mild bruising at L breast biopsy site   LABORATORY DATA: I have reviewed the data as listed. CBC    Component Value Date/Time   WBC 7.9 09/26/2015 1239   RBC 4.23 09/26/2015 1239   HGB 12.6 09/26/2015 1239   HCT 39.0 09/26/2015 1239   PLT 277 09/26/2015 1239   MCV 92.2 09/26/2015 1239   MCH 29.8 09/26/2015 1239   MCHC 32.3 09/26/2015 1239   RDW 13.9 09/26/2015 1239   LYMPHSABS 3.0 09/26/2015 1239   MONOABS 0.8 09/26/2015 1239   EOSABS 0.1 09/26/2015 1239   BASOSABS 0.0 09/26/2015 1239      Chemistry      Component Value Date/Time   NA 137 09/26/2015 1239   K 4.2 09/26/2015  1239   CL 104 09/26/2015 1239   CO2 25 09/26/2015 1239   BUN 15 09/26/2015 1239   CREATININE 0.92 09/26/2015 1239      Component Value Date/Time   CALCIUM 9.0 09/26/2015 1239   ALKPHOS 51 09/26/2015 1239   AST 22 09/26/2015 1239   ALT 15 09/26/2015 1239   BILITOT 0.6 09/26/2015 1239     RADIOGRAPHIC STUDIES: I have personally reviewed the below radiographic studies and agree with their findings.  Study Result   CLINICAL DATA:  History of left breast cancer in 2014 status post breast conservation surgery.  EXAM: 2D DIGITAL DIAGNOSTIC BILATERAL MAMMOGRAM WITH CAD AND ADJUNCT TOMO  ULTRASOUND RIGHT BREAST  COMPARISON:  Previous exam(s).  ACR Breast Density Category b: There are scattered areas of fibroglandular density.  FINDINGS: There are stable postsurgical changes within the left breast. There are no new dominant masses,  suspicious calcifications or secondary signs of malignancy within the left breast.  There is a focal asymmetry within the upper-outer quadrant of the right breast, at anterior to middle depth, best seen on the CC tomosynthesis slice 30 (also spot compression CC tomosynthesis slice 33), with associated new calcifications. On magnification views, the calcifications are amorphous and slightly pleomorphic, loosely grouped, spanning approximately 1.4 cm.  Mammographic images were processed with CAD.  Targeted ultrasound is performed, evaluating the upper-outer quadrant of the right breast corresponding to the mammographic finding, showing a benign-appearing cluster of cysts at the 10 o'clock axis, 5 cm from the nipple, measuring 1.2 x 0.6 x 1.1 cm, avascular, a possible correlate for the mammographic finding. No suspicious solid or cystic masses are identified within the upper-outer quadrant.  Right axilla was evaluated with ultrasound showing no enlarged or morphologically abnormal lymph nodes.  IMPRESSION: 1. Focal asymmetry within the upper-outer quadrant of the right breast, with associated new calcifications. This is a suspicious finding for which stereotactic biopsy with 3D tomosynthesis guidance is recommended. 2. Cluster of cysts at the 10 o'clock axis, 5 cm from the nipple, measuring 1.2 x 0.6 x 1.1 cm, a possible correlate for the mammographic asymmetry. Findings may indicate fibrocystic change with calcifications.  RECOMMENDATION: Stereotactic biopsy, with 3D tomosynthesis guidance, for the focal asymmetry in the upper-outer quadrant of the right breast with associated calcifications. Recommend stereotactic biopsy localization at the lateral margin of the asymmetry to include both the asymmetry and calcifications within the specimen.  Ordering physician will be contacted with today's results and patient will then be scheduled for stereotactic biopsy at the  [facility] at her earliest convenience.  I have discussed the findings and recommendations with the patient. Results were also provided in writing at the conclusion of the visit. If applicable, a reminder letter will be sent to the patient regarding the next appointment.  BI-RADS CATEGORY  4: Suspicious.   Electronically Signed   By: Franki Cabot M.D.   On: 02/26/2016 13:07     PATHOLOGY:       ASSESSMENT AND PLAN:  Stage IA invasive breast cancer L breast, ER positive, PR positive, HER-2/neu over amplified; Arimidex started 06/2013 Hot flashes secondary to above Normal Bone Density on DEXA 08/02/2013 Osteopenia on DEXA 07/2015 New diagnosis high grade DCIS, high grade ER- PR- R breast  She is overall doing well. She is compliant with her Arimidex therapy. She states she takes calcium and vitamin D daily.She is on prolia, next dose is due in December.  I discussed with Lizania that her current  malignancy is ER-, she is to continue on her arimidex for her initial breast cancer.  She will need a lumpectomy and then possibly radiation since it is high grade. I will refer her radiation oncology.   We will flush her port today.   We reviewed the NCCN guidelines in detail regarding DCIS. She was giving information about DCIS today. WE again reviewed the relevance of ER- prognostic markers.   Pathology review: I discussed with the patient the difference between DCIS and invasive breast cancer. It is considered a precancerous lesion. DCIS is classified as a 0. It is generally detected through mammograms as calcifications. We discussed the significance of grades and its impact on prognosis. We also discussed the importance of ER and PR receptors and their implications to adjuvant treatment options. Prognosis of DCIS dependence on grade, comedo necrosis. It is anticipated that if not treated, 20-30% of DCIS can develop into invasive breast  cancer.  Recommendation: 1. Breast conserving surgery 2. Followed by adjuvant radiation therapy  She will return for a follow up in December after her lumpectomy.   THERAPY PLAN:  NCCN guidelines recommends the following surveillance for invasive breast cancer:  A. History and Physical exam every 4-6 months for 5 years and then every 12 months.  B. Mammography every 12 months  C. Women on Tamoxifen: annual gynecologic assessment every 12 months if uterus is present.  D. Women on aromatase inhibitor or who experience ovarian failure secondary to treatment should have monitoring of bone health with a bone mineral density determination at baseline and periodically thereafter.  E. Assess and encourage adherence to adjuvant endocrine therapy.  F. Evidence suggests that active lifestyle and achieving and maintaining an ideal body weight (20-25 BMI) may lead to optimal breast cancer outcomes.  All questions were answered. The patient knows to call the clinic with any problems, questions or concerns. We can certainly see the patient much sooner if necessary.   Note that 40 minutes was spent in direct patient consultation, physical exam and counseling.   This document serves as a record of services personally performed by Ancil Linsey, MD. It was created on her behalf by Martinique Casey, a trained medical scribe. The creation of this record is based on the scribe's personal observations and the provider's statements to them. This document has been checked and approved by the attending provider.  I have reviewed the above documentation for accuracy and completeness and I agree with the above.  This note is electronically signed.  Kelby Fam. Whitney Muse, MD

## 2016-03-12 NOTE — Progress Notes (Signed)
Vickii Penna presented for Portacath access and flush.   Portacath located left chest wall accessed with  H 20 needle.  Good blood return present. Portacath flushed with 82ml NS and 500U/33ml Heparin and needle removed intact.  Procedure tolerated well and without incident.

## 2016-03-17 ENCOUNTER — Ambulatory Visit (INDEPENDENT_AMBULATORY_CARE_PROVIDER_SITE_OTHER): Payer: Medicare Other | Admitting: *Deleted

## 2016-03-17 DIAGNOSIS — I4891 Unspecified atrial fibrillation: Secondary | ICD-10-CM

## 2016-03-17 DIAGNOSIS — Z5181 Encounter for therapeutic drug level monitoring: Secondary | ICD-10-CM

## 2016-03-17 LAB — POCT INR: INR: 2.5

## 2016-03-25 ENCOUNTER — Other Ambulatory Visit (HOSPITAL_COMMUNITY): Payer: Self-pay | Admitting: General Surgery

## 2016-03-25 DIAGNOSIS — D0511 Intraductal carcinoma in situ of right breast: Secondary | ICD-10-CM

## 2016-03-25 NOTE — H&P (Signed)
  NTS SOAP Note  Vital Signs:  Vitals as of: 0000000: Systolic 99991111: Diastolic 83: Heart Rate 94: Temp 78F (Temporal): Height 52ft 1.5in: Weight 144Lbs 0 Ounces: BMI 26.77   BMI : 26.77 kg/m2  Subjective: This 75 year old female presents for of a newly diagnosed right breast cancer.  Has been treated for left breast cancer since surgery in 2014.  Does not feel a lump.  Found on routine mammogram.  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  No family h/o breast cancer that she knows of.  No nipple discharge, dimpling.  Review of Symptoms:  Constitutional:negative Head:negative Eyes:negative Nose/Mouth/Throat:negative Cardiovascular:negative Respiratory:negative Gastrointestinnausea Genitourinary:frequency back pain dry as above Hematolgic/Lymphatic:negative Allergic/Immunologic:negative   Past Medical History:Reviewed  Past Medical History  Surgical History: left partial mastectomy with lymph node biopsy, TAH, cholecyestectomy Medical Problems: Left breast cancer, HTN, atrial fibrillation Allergies: sulfa, iv contrast Medications: amridex, zyrtect, prolia, cardizem, ativan, cozaar, protonix, reglan, zocor, coumadin   Social History:Reviewed  Social History  Preferred Language: English Race:  White Ethnicity: Not Hispanic / Latino Age: 26 year Marital Status:  S Alcohol: no   Smoking Status: Never smoker reviewed on 03/18/2016 Functional Status reviewed on 03/18/2016 ------------------------------------------------ Bathing: Normal Cooking: Normal Dressing: Normal Driving: Normal Eating: Normal Managing Meds: Normal Oral Care: Normal Shopping: Normal Toileting: Normal Transferring: Normal Walking: Normal Cognitive Status reviewed on 03/18/2016 ------------------------------------------------ Attention: Normal Decision Making: Normal Language: Normal Memory: Normal Motor: Normal Perception: Normal Problem Solving: Normal Visual and  Spatial: Normal   Family History:Reviewed  Family Health History Family History is Unknown    Objective Information: General:Well appearing, well nourished in no distress. Skin:no rash or prominent lesions Head:Atraumatic; no masses; no abnormalities Neck:Supple without lymphadenopathy.  Heart:RRR, no murmur or gallop.  Normal S1, S2.  No S3, S4.  Lungs:CTA bilaterally, no wheezes, rhonchi, rales.  Breathing unlabored. Surcical scar in left breast.  No dominant mass, nipple discharge, dimpling in either breast.  Axillas negative for palpable nodes. Path report, mammogram reports, Dr. Donald Pore notes reviewed. 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 Whitney Muse.  Patient would like to keep her breast but does not want to undergo XRT.  Scheduled for right partial mastectomy after needile localization on 04/02/16.  To stop coumadin 03/30/16.  She fully realizes that her risk of recurrent breast cancer rises due to refusing postop XRT.   Patient Education:Alternative treatments to surgery were discussed with patient (and family).Risks and benefits  of procedure were fully explained to the patient (and family) who gave informed consent. Patient/family questions were addressed.

## 2016-03-27 ENCOUNTER — Other Ambulatory Visit (HOSPITAL_COMMUNITY): Payer: Medicare Other

## 2016-03-27 ENCOUNTER — Ambulatory Visit (HOSPITAL_COMMUNITY): Payer: Medicare Other | Admitting: Hematology & Oncology

## 2016-03-28 NOTE — Patient Instructions (Addendum)
Stacie Huang  03/28/2016     @PREFPERIOPPHARMACY @   Your procedure is scheduled on  04/02/2016   Report to [facility] at  28  A.M.  Call this number if you have problems the morning of surgery:  [phone]   Remember:  Do not eat food or drink liquids after midnight.  Take these medicines the morning of surgery with A SIP OF WATER  Armidex, atenolol, zyrtec, diltiazem, losartan, reglan, protonix.   Do not wear jewelry, make-up or nail polish.  Do not wear lotions, powders, or perfumes, or deoderant.  Do not shave 48 hours prior to surgery.  Men may shave face and neck.  Do not bring valuables to the hospital.  [facility] is not responsible for any belongings or valuables.  Contacts, dentures or bridgework may not be worn into surgery.  Leave your suitcase in the car.  After surgery it may be brought to your room.  For patients admitted to the hospital, discharge time will be determined by your treatment team.  Patients discharged the day of surgery will not be allowed to drive home.   Name and phone number of your driver:   family Special instructions:  none  Please read over the following fact sheets that you were given. Anesthesia Post-op Instructions and Care and Recovery After Surgery       Breast Biopsy Introduction A breast biopsy is a test during which a sample of tissue is taken from your breast. The breast tissue is looked at under a microscope for cancer cells. What happens before the procedure?  Plan to have someone take you home after the test.  Do not use tobacco products. These include cigarettes, chewing tobacco, or e-cigarettes. If you need help quitting, ask your doctor.  Do not drink alcohol for 24 hours before the test.  Ask your doctor about:  Changing or stopping your normal medicines. This is important if you take diabetes medicines or blood thinners.  Taking medicines such as aspirin and ibuprofen. These medicines can thin  your blood. Do not take these medicines before your procedure if your doctor tells you not to.  Wear a good support bra to the test.  Ask your doctor how your surgical site will be marked or identified.  You may be given antibiotic medicine to help prevent infection.  You may be checked for extra fluid in your body (lymphedema).  Your doctor may place a wire or a seed in the lump. The wire or seed gives off radiation. This will help your doctor to see the lump during the biopsy. What happens during the procedure? You may be given the following:  A medicine to numb the breast area (local anesthetic).  A medicine to help you relax (sedative). There are different types of breast biopsies. Each type is described below. Fine-Needle Aspiration  A needle will be put into the breast lump.  The needle will take out fluid and cells from the lump. Core-Needle Biopsy  A needle will be put into the breast lump.  The needle will be put into your breast several times.  The needle will remove breast tissue. Stereotactic Biopsy  You will lie on a table on your belly. Your breast will pass through a hole in the table. Your breast will be held in place.  X-rays and a computer will be used to locate the breast lump.  A needle will be used to remove tissue samples from  your breast. Vacuum-Assisted Biopsy  A small cut (incision) will be made in your breast.  A biopsy device will be put through the cut and into the breast tissue.  The biopsy device will draw abnormal breast tissue into the biopsy device.  A large tissue sample will often be removed.  No stitches will be needed. Ultrasound-Guided Core-Needle Biopsy  Ultrasound imaging will help guide the needle into the area of the breast that is not normal.  A cut will be made in the breast. The needle will be put into the breast lump.  Tissue samples will be taken out. Surgical Biopsy  A cut will be made in the breast to remove  tissue.  The cut will be closed with stitches and covered with a bandage.  There are two types:  Incisional biopsy. Your doctor will remove part of the breast lump.  Excisional biopsy. Your doctor will try to remove the whole breast lump or as much as possible. All tissue or fluid samples will be looked at under a microscope. What happens after the procedure?  You will be able to go home when you are doing well and you are not having problems.  You may have bruising on your breast. This is normal.  A pressure bandage (dressing) may be put on your breast. It may be left on for 24-48 hours. This type of bandage is wrapped tightly around your chest. It helps to stop fluid from building up under tissues. You may also need to wear a supportive bra during this time.  Do not drive for 24 hours if you received a sedative. This information is not intended to replace advice given to you by your health care provider. Make sure you discuss any questions you have with your health care provider. Document Released: 07/07/2011 Document Revised: 12/20/2015 Document Reviewed: 01/16/2015  2017 Elsevier  Breast Biopsy, Care After Introduction These instructions give you information about caring for yourself after your procedure. Your doctor may also give you more specific instructions. Call your doctor if you have any problems or questions after your procedure. Follow these instructions at home: Medicines  Take over-the-counter and prescription medicines only as told by your doctor.  Do not drive for 24 hours if you received a sedative.  Do not drink alcohol while taking pain medicine.  Do not drive or use heavy machinery while taking prescription pain medicine. Biopsy Site Care   Follow instructions from your doctor about how to take care of your cut from surgery (incision) or puncture area. Make sure you:  Wash your hands with soap and water before you change your bandage. If you cannot use  soap and water, use hand sanitizer.  Change any bandages (dressings) as told by your doctor.  Leave any stitches (sutures), skin glue, or skin tape (adhesive) strips in place. They may need to stay in place for 2 weeks or longer. If tape strips get loose and curl up, you may trim the loose edges. Do not remove tape strips completely unless your doctor says it is okay.  If you have stitches, keep them dry when you take a bath or a shower.  Check your cut or puncture area every day for signs of infection. Check for:  More redness, swelling, or pain.  More fluid or blood.  Warmth.  Pus or a bad smell.  Protect the biopsy area. Do not let the area get bumped. Activity  Avoid activities that could pull the biopsy site open.  Avoid stretching.  Avoid reaching.  Avoid exercise.  Avoid sports.  Avoid lifting anything that is heavier than 3 pounds (1.4 kg).  Return to your normal activities as told by your doctor. Ask your doctor what activities are safe for you. General instructions  Continue your normal diet.  Wear a good support bra for as long as told by your doctor.  Get checked for extra fluid in your body (lymphedema) as often as told by your doctor.  Keep all follow-up visits as told by your doctor. This is important. Contact a health care provider if:  You have more redness, swelling, or pain at the biopsy site.  You have more fluid or blood coming from your biopsy site.  Your biopsy site feels warm to the touch.  You have pus or a bad smell coming from the biopsy site.  Your biopsy site breaks open after the stitches, staples, or skin tape strips have been removed.  You have a rash.  You have a fever. Get help right away if:  You have more bleeding (more than a small spot) from the biopsy site.  You have trouble breathing.  You have red streaks around the biopsy site. This information is not intended to replace advice given to you by your health care  provider. Make sure you discuss any questions you have with your health care provider. Document Released: 02/08/2009 Document Revised: 12/20/2015 Document Reviewed: 01/16/2015  2017 Elsevier PATIENT INSTRUCTIONS POST-ANESTHESIA  IMMEDIATELY FOLLOWING SURGERY:  Do not drive or operate machinery for the first twenty four hours after surgery.  Do not make any important decisions for twenty four hours after surgery or while taking narcotic pain medications or sedatives.  If you develop intractable nausea and vomiting or a severe headache please notify your doctor immediately.  FOLLOW-UP:  Please make an appointment with your surgeon as instructed. You do not need to follow up with anesthesia unless specifically instructed to do so.  WOUND CARE INSTRUCTIONS (if applicable):  Keep a dry clean dressing on the anesthesia/puncture wound site if there is drainage.  Once the wound has quit draining you may leave it open to air.  Generally you should leave the bandage intact for twenty four hours unless there is drainage.  If the epidural site drains for more than 36-48 hours please call the anesthesia department.  QUESTIONS?:  Please feel free to call your physician or the hospital operator if you have any questions, and they will be happy to assist you.

## 2016-03-31 ENCOUNTER — Other Ambulatory Visit: Payer: Self-pay

## 2016-03-31 ENCOUNTER — Encounter (HOSPITAL_COMMUNITY): Payer: Self-pay

## 2016-03-31 ENCOUNTER — Ambulatory Visit (HOSPITAL_COMMUNITY)
Admission: RE | Admit: 2016-03-31 | Discharge: 2016-03-31 | Disposition: A | Discharge status: Home / Self Care | Payer: Medicare Other | Source: Ambulatory Visit | Attending: General Surgery | Admitting: General Surgery

## 2016-03-31 ENCOUNTER — Encounter (HOSPITAL_COMMUNITY)
Admission: RE | Admit: 2016-03-31 | Discharge: 2016-03-31 | Disposition: A | Discharge status: Home / Self Care | Payer: Medicare Other | Source: Ambulatory Visit | Attending: General Surgery | Admitting: General Surgery

## 2016-03-31 DIAGNOSIS — Z01812 Encounter for preprocedural laboratory examination: Secondary | ICD-10-CM | POA: Diagnosis not present

## 2016-03-31 DIAGNOSIS — Z01818 Encounter for other preprocedural examination: Secondary | ICD-10-CM | POA: Diagnosis present

## 2016-03-31 DIAGNOSIS — I517 Cardiomegaly: Secondary | ICD-10-CM | POA: Insufficient documentation

## 2016-03-31 DIAGNOSIS — Z01810 Encounter for preprocedural cardiovascular examination: Secondary | ICD-10-CM | POA: Diagnosis not present

## 2016-03-31 DIAGNOSIS — C50911 Malignant neoplasm of unspecified site of right female breast: Secondary | ICD-10-CM

## 2016-03-31 HISTORY — DX: Anxiety disorder, unspecified: F41.9

## 2016-03-31 LAB — COMPREHENSIVE METABOLIC PANEL
ALK PHOS: 41 U/L (ref 38–126)
ALT: 14 U/L (ref 14–54)
AST: 20 U/L (ref 15–41)
Albumin: 3.9 g/dL (ref 3.5–5.0)
Anion gap: 8 (ref 5–15)
BILIRUBIN TOTAL: 0.6 mg/dL (ref 0.3–1.2)
BUN: 17 mg/dL (ref 6–20)
CO2: 24 mmol/L (ref 22–32)
CREATININE: 0.94 mg/dL (ref 0.44–1.00)
Calcium: 9.1 mg/dL (ref 8.9–10.3)
Chloride: 105 mmol/L (ref 101–111)
GFR calc Af Amer: >60 mL/min (ref >60)
GFR, EST NON AFRICAN AMERICAN: 57 mL/min — AB (ref >60)
Glucose, Bld: 115 mg/dL — ABNORMAL HIGH (ref 65–99)
Potassium: 3.9 mmol/L (ref 3.5–5.1)
Sodium: 137 mmol/L (ref 135–145)
TOTAL PROTEIN: 6.7 g/dL (ref 6.5–8.1)

## 2016-03-31 LAB — CBC WITH DIFFERENTIAL/PLATELET
BASOS ABS: 0 10*3/uL (ref 0.0–0.1)
Basophils Relative: 0 %
Eosinophils Absolute: 0.2 10*3/uL (ref 0.0–0.7)
Eosinophils Relative: 2 %
HEMATOCRIT: 38.9 % (ref 36.0–46.0)
HEMOGLOBIN: 12.3 g/dL (ref 12.0–15.0)
LYMPHS PCT: 45 %
Lymphs Abs: 3.5 10*3/uL (ref 0.7–4.0)
MCH: 29.6 pg (ref 26.0–34.0)
MCHC: 31.6 g/dL (ref 30.0–36.0)
MCV: 93.5 fL (ref 78.0–100.0)
MONO ABS: 0.8 10*3/uL (ref 0.1–1.0)
Monocytes Relative: 10 %
NEUTROS ABS: 3.3 10*3/uL (ref 1.7–7.7)
Neutrophils Relative %: 43 %
Platelets: 265 10*3/uL (ref 150–400)
RBC: 4.16 MIL/uL (ref 3.87–5.11)
RDW: 14.1 % (ref 11.5–15.5)
WBC: 7.7 10*3/uL (ref 4.0–10.5)

## 2016-03-31 LAB — PROTIME-INR
INR: 1.81
PROTHROMBIN TIME: 21.2 s — AB (ref 11.4–15.2)

## 2016-04-01 NOTE — Progress Notes (Signed)
Pt in Atrial Fibrillation on EKG.  Cardiology consulted. Pt is in Chronic Atrial Fibrilltion. Dr. Patsey Berthold notified. Pt is okay for surgery.

## 2016-04-02 ENCOUNTER — Ambulatory Visit (HOSPITAL_COMMUNITY)
Admission: RE | Admit: 2016-04-02 | Discharge: 2016-04-02 | Disposition: A | Discharge status: Home / Self Care | Payer: Medicare Other | Source: Ambulatory Visit | Attending: General Surgery | Admitting: General Surgery

## 2016-04-02 ENCOUNTER — Ambulatory Visit (HOSPITAL_COMMUNITY): Payer: Medicare Other

## 2016-04-02 ENCOUNTER — Ambulatory Visit (HOSPITAL_COMMUNITY): Payer: Medicare Other | Admitting: Anesthesiology

## 2016-04-02 ENCOUNTER — Encounter (HOSPITAL_COMMUNITY): Payer: Self-pay | Admitting: *Deleted

## 2016-04-02 ENCOUNTER — Encounter (HOSPITAL_COMMUNITY)
Admission: RE | Disposition: A | Discharge status: Home / Self Care | Payer: Self-pay | Source: Ambulatory Visit | Attending: General Surgery

## 2016-04-02 DIAGNOSIS — I10 Essential (primary) hypertension: Secondary | ICD-10-CM | POA: Insufficient documentation

## 2016-04-02 DIAGNOSIS — N6310 Unspecified lump in the right breast, unspecified quadrant: Secondary | ICD-10-CM

## 2016-04-02 DIAGNOSIS — Z7901 Long term (current) use of anticoagulants: Secondary | ICD-10-CM | POA: Insufficient documentation

## 2016-04-02 DIAGNOSIS — D0511 Intraductal carcinoma in situ of right breast: Secondary | ICD-10-CM | POA: Insufficient documentation

## 2016-04-02 DIAGNOSIS — Z853 Personal history of malignant neoplasm of breast: Secondary | ICD-10-CM | POA: Diagnosis not present

## 2016-04-02 DIAGNOSIS — K219 Gastro-esophageal reflux disease without esophagitis: Secondary | ICD-10-CM | POA: Diagnosis not present

## 2016-04-02 DIAGNOSIS — Z91041 Radiographic dye allergy status: Secondary | ICD-10-CM | POA: Diagnosis not present

## 2016-04-02 DIAGNOSIS — F419 Anxiety disorder, unspecified: Secondary | ICD-10-CM | POA: Insufficient documentation

## 2016-04-02 DIAGNOSIS — Z882 Allergy status to sulfonamides status: Secondary | ICD-10-CM | POA: Diagnosis not present

## 2016-04-02 DIAGNOSIS — I4891 Unspecified atrial fibrillation: Secondary | ICD-10-CM | POA: Insufficient documentation

## 2016-04-02 HISTORY — PX: PARTIAL MASTECTOMY WITH NEEDLE LOCALIZATION: SHX6008

## 2016-04-02 SURGERY — PARTIAL MASTECTOMY WITH NEEDLE LOCALIZATION
Anesthesia: General | Site: Breast | Laterality: Right

## 2016-04-02 MED ORDER — LIDOCAINE HCL (PF) 1 % IJ SOLN
INTRAMUSCULAR | Status: AC
  Filled 2016-04-02: qty 5

## 2016-04-02 MED ORDER — CHLORHEXIDINE GLUCONATE CLOTH 2 % EX PADS: 6.0000 | MEDICATED_PAD | Freq: Once | CUTANEOUS | Status: DC

## 2016-04-02 MED ORDER — PHENYLEPHRINE HCL 10 MG/ML IJ SOLN
INTRAMUSCULAR | Status: DC | PRN
  Administered 2016-04-02: 40 ug via INTRAVENOUS

## 2016-04-02 MED ORDER — PHENYLEPHRINE 40 MCG/ML (10ML) SYRINGE FOR IV PUSH (FOR BLOOD PRESSURE SUPPORT)
PREFILLED_SYRINGE | INTRAVENOUS | Status: AC
  Filled 2016-04-02: qty 10

## 2016-04-02 MED ORDER — LACTATED RINGERS IV SOLN
INTRAVENOUS | Status: DC | PRN
  Administered 2016-04-02: 09:00:00 via INTRAVENOUS

## 2016-04-02 MED ORDER — FENTANYL CITRATE (PF) 100 MCG/2ML IJ SOLN
INTRAMUSCULAR | Status: DC | PRN
  Administered 2016-04-02: 25 ug via INTRAVENOUS
  Administered 2016-04-02: 50 ug via INTRAVENOUS
  Administered 2016-04-02 (×2): 25 ug via INTRAVENOUS

## 2016-04-02 MED ORDER — MIDAZOLAM HCL 2 MG/2ML IJ SOLN
INTRAMUSCULAR | Status: AC
  Filled 2016-04-02: qty 2

## 2016-04-02 MED ORDER — CEFAZOLIN SODIUM-DEXTROSE 2-4 GM/100ML-% IV SOLN
2.0000 g | INTRAVENOUS | Status: AC
  Administered 2016-04-02: 2 g via INTRAVENOUS
  Filled 2016-04-02: qty 100

## 2016-04-02 MED ORDER — POVIDONE-IODINE 10 % EX SOLN
CUTANEOUS | Status: AC
  Filled 2016-04-02: qty 30

## 2016-04-02 MED ORDER — FENTANYL CITRATE (PF) 250 MCG/5ML IJ SOLN
INTRAMUSCULAR | Status: AC
  Filled 2016-04-02: qty 5

## 2016-04-02 MED ORDER — BUPIVACAINE HCL (PF) 0.25 % IJ SOLN
INTRAMUSCULAR | Status: DC | PRN
  Administered 2016-04-02: 10 mL

## 2016-04-02 MED ORDER — LACTATED RINGERS IV SOLN
INTRAVENOUS | Status: DC
  Administered 2016-04-02: 09:00:00 via INTRAVENOUS

## 2016-04-02 MED ORDER — ARTIFICIAL TEARS OP OINT
TOPICAL_OINTMENT | OPHTHALMIC | Status: AC
  Filled 2016-04-02: qty 7

## 2016-04-02 MED ORDER — LIDOCAINE HCL (CARDIAC) 20 MG/ML IV SOLN
INTRAVENOUS | Status: DC | PRN
  Administered 2016-04-02: 100 mg via INTRAVENOUS
  Administered 2016-04-02: 25 mg via INTRAVENOUS

## 2016-04-02 MED ORDER — FENTANYL CITRATE (PF) 100 MCG/2ML IJ SOLN
25.0000 ug | INTRAMUSCULAR | Status: DC | PRN
Start: 2016-04-02 — End: 2016-04-02

## 2016-04-02 MED ORDER — ONDANSETRON HCL 4 MG/2ML IJ SOLN
INTRAMUSCULAR | Status: AC
  Filled 2016-04-02: qty 2

## 2016-04-02 MED ORDER — BUPIVACAINE HCL (PF) 0.5 % IJ SOLN
INTRAMUSCULAR | Status: AC
  Filled 2016-04-02: qty 30

## 2016-04-02 MED ORDER — ROCURONIUM BROMIDE 50 MG/5ML IV SOLN
INTRAVENOUS | Status: AC
  Filled 2016-04-02: qty 1

## 2016-04-02 MED ORDER — 0.9 % SODIUM CHLORIDE (POUR BTL) OPTIME
TOPICAL | Status: DC | PRN
  Administered 2016-04-02: 1000 mL

## 2016-04-02 MED ORDER — KETOROLAC TROMETHAMINE 30 MG/ML IJ SOLN
30.0000 mg | Freq: Once | INTRAMUSCULAR | Status: AC
  Administered 2016-04-02: 30 mg via INTRAVENOUS
  Filled 2016-04-02: qty 1

## 2016-04-02 MED ORDER — HYDROCODONE-ACETAMINOPHEN 5-325 MG PO TABS: 1.0000 | ORAL_TABLET | Freq: Four times a day (QID) | ORAL | 0 refills | Status: DC | PRN

## 2016-04-02 MED ORDER — ONDANSETRON HCL 4 MG/2ML IJ SOLN
4.0000 mg | Freq: Once | INTRAMUSCULAR | Status: AC
  Administered 2016-04-02: 4 mg via INTRAVENOUS

## 2016-04-02 MED ORDER — MIDAZOLAM HCL 2 MG/2ML IJ SOLN
1.0000 mg | INTRAMUSCULAR | Status: DC | PRN
  Administered 2016-04-02: 2 mg via INTRAVENOUS

## 2016-04-02 SURGICAL SUPPLY — 35 items
ADH SKN CLS APL DERMABOND .7 (GAUZE/BANDAGES/DRESSINGS) ×1
BAG HAMPER (MISCELLANEOUS) ×3 IMPLANT
CHLORAPREP W/TINT 26ML (MISCELLANEOUS) ×2 IMPLANT
CLOSURE WOUND 1/4 X3 (GAUZE/BANDAGES/DRESSINGS)
CLOTH BEACON ORANGE TIMEOUT ST (SAFETY) ×3 IMPLANT
COVER LIGHT HANDLE STERIS (MISCELLANEOUS) ×6 IMPLANT
DECANTER SPIKE VIAL GLASS SM (MISCELLANEOUS) ×3 IMPLANT
DERMABOND ADVANCED (GAUZE/BANDAGES/DRESSINGS) ×2
DERMABOND ADVANCED .7 DNX12 (GAUZE/BANDAGES/DRESSINGS) IMPLANT
ELECT REM PT RETURN 9FT ADLT (ELECTROSURGICAL) ×3
ELECTRODE REM PT RTRN 9FT ADLT (ELECTROSURGICAL) ×1 IMPLANT
GLOVE BIOGEL PI IND STRL 7.0 (GLOVE) ×1 IMPLANT
GLOVE BIOGEL PI INDICATOR 7.0 (GLOVE) ×2
GLOVE SURG SS PI 7.5 STRL IVOR (GLOVE) ×6 IMPLANT
GOWN STRL REUS W/TWL LRG LVL3 (GOWN DISPOSABLE) ×9 IMPLANT
HEMOSTAT ARISTA ABSORB 3G PWDR (MISCELLANEOUS) ×2 IMPLANT
KIT ROOM TURNOVER APOR (KITS) ×3 IMPLANT
MANIFOLD NEPTUNE II (INSTRUMENTS) ×3 IMPLANT
NDL HYPO 25X1 1.5 SAFETY (NEEDLE) ×1 IMPLANT
NEEDLE HYPO 25X1 1.5 SAFETY (NEEDLE) ×3 IMPLANT
NS IRRIG 1000ML POUR BTL (IV SOLUTION) ×3 IMPLANT
PACK MINOR (CUSTOM PROCEDURE TRAY) ×3 IMPLANT
PAD ARMBOARD 7.5X6 YLW CONV (MISCELLANEOUS) ×3 IMPLANT
SET BASIN LINEN APH (SET/KITS/TRAYS/PACK) ×3 IMPLANT
SPONGE GAUZE 2X2 8PLY STER LF (GAUZE/BANDAGES/DRESSINGS)
SPONGE GAUZE 2X2 8PLY STRL LF (GAUZE/BANDAGES/DRESSINGS) IMPLANT
SPONGE LAP 18X18 X RAY DECT (DISPOSABLE) ×3 IMPLANT
STRIP CLOSURE SKIN 1/4X3 (GAUZE/BANDAGES/DRESSINGS) IMPLANT
SUT SILK 3 0 (SUTURE) ×3
SUT SILK 3-0 FS1 18XBRD (SUTURE) IMPLANT
SUT VIC AB 3-0 SH 27 (SUTURE) ×3
SUT VIC AB 3-0 SH 27X BRD (SUTURE) ×1 IMPLANT
SUT VIC AB 4-0 PS2 27 (SUTURE) ×3 IMPLANT
SYR BULB IRRIGATION 50ML (SYRINGE) ×2 IMPLANT
SYRINGE 10CC LL (SYRINGE) ×3 IMPLANT

## 2016-04-02 NOTE — Transfer of Care (Signed)
Immediate Anesthesia Transfer of Care Note  Patient: Stacie Huang  Procedure(s) Performed: Procedure(s): RIGHT PARTIAL MASTECTOMY AFTER NEEDLE LOCALIZATION (Right)  Patient Location: PACU  Anesthesia Type:General  Level of Consciousness: awake, oriented and patient cooperative  Airway & Oxygen Therapy: Patient Spontanous Breathing and Patient connected to face mask oxygen  Post-op Assessment: Report given to RN and Post -op Vital signs reviewed and stable  Post vital signs: Reviewed and stable  Last Vitals:  Vitals:   04/02/16 0845 04/02/16 0850  BP: (!) 147/87 (!) 140/92  Pulse:    Resp: 11   Temp:      Last Pain:  Vitals:   04/02/16 0825  TempSrc: Oral      Patients Stated Pain Goal: 5 ([phone] [id number])  Complications: No apparent anesthesia complications

## 2016-04-02 NOTE — Anesthesia Preprocedure Evaluation (Signed)
Anesthesia Evaluation  Patient identified by MRN, date of birth, ID band Patient awake    Reviewed: Allergy & Precautions, H&P , NPO status , Patient's Chart, lab work & pertinent test results  Airway Mallampati: II   Neck ROM: Full    Dental  (+) Partial Lower, Partial Upper   Pulmonary    breath sounds clear to auscultation       Cardiovascular hypertension, Pt. on medications + dysrhythmias Atrial Fibrillation  Rhythm:Irregular Rate:Normal     Neuro/Psych Anxiety    GI/Hepatic GERD  Medicated and Controlled,  Endo/Other    Renal/GU      Musculoskeletal   Abdominal   Peds  Hematology   Anesthesia Other Findings   Reproductive/Obstetrics                             Anesthesia Physical Anesthesia Plan  ASA: III  Anesthesia Plan: General   Post-op Pain Management:    Induction: Intravenous  Airway Management Planned: LMA  Additional Equipment:   Intra-op Plan:   Post-operative Plan: Extubation in OR  Informed Consent: I have reviewed the patients History and Physical, chart, labs and discussed the procedure including the risks, benefits and alternatives for the proposed anesthesia with the patient or authorized representative who has indicated his/her understanding and acceptance.     Plan Discussed with:   Anesthesia Plan Comments:         Anesthesia Quick Evaluation

## 2016-04-02 NOTE — Anesthesia Procedure Notes (Signed)
Procedure Name: LMA Insertion Date/Time: 04/02/2016 9:03 AM Performed by: Andree Elk, Evalene Vath A Pre-anesthesia Checklist: Timeout performed, Patient identified, Emergency Drugs available, Suction available and Patient being monitored Patient Re-evaluated:Patient Re-evaluated prior to inductionOxygen Delivery Method: Circle system utilized Preoxygenation: Pre-oxygenation with 100% oxygen Intubation Type: IV induction Ventilation: Mask ventilation without difficulty LMA: LMA inserted LMA Size: 3.0 Number of attempts: 1 Placement Confirmation: positive ETCO2 and breath sounds checked- equal and bilateral Dental Injury: Teeth and Oropharynx as per pre-operative assessment

## 2016-04-02 NOTE — Interval H&P Note (Signed)
History and Physical Interval Note:  04/02/2016 8:18 AM  Stacie Huang  has presented today for surgery, with the diagnosis of dcis  The various methods of treatment have been discussed with the patient and family. After consideration of risks, benefits and other options for treatment, the patient has consented to  Procedure(s): PARTIAL MASTECTOMY WITH NEEDLE LOCALIZATION (Right) as a surgical intervention .  The patient's history has been reviewed, patient examined, no change in status, stable for surgery.  I have reviewed the patient's chart and labs.  Questions were answered to the patient's satisfaction.     Aviva Signs A

## 2016-04-02 NOTE — Anesthesia Postprocedure Evaluation (Signed)
Anesthesia Post Note  Patient: Stacie Huang  Procedure(s) Performed: Procedure(s) (LRB): RIGHT PARTIAL MASTECTOMY AFTER NEEDLE LOCALIZATION (Right)  Patient location during evaluation: PACU Anesthesia Type: General Level of consciousness: awake and alert and oriented Pain management: pain level controlled Vital Signs Assessment: post-procedure vital signs reviewed and stable Respiratory status: spontaneous breathing Cardiovascular status: stable Postop Assessment: no signs of nausea or vomiting Anesthetic complications: no    Last Vitals:  Vitals:   04/02/16 0850 04/02/16 1005  BP: (!) 140/92 132/77  Pulse:  72  Resp:  13  Temp:  36.6 C    Last Pain:  Vitals:   04/02/16 0825  TempSrc: Oral                 ADAMS, AMY A

## 2016-04-02 NOTE — Progress Notes (Signed)
Patient to mammography at this time.

## 2016-04-02 NOTE — Progress Notes (Addendum)
Patient returned to short stay preop from mammography.

## 2016-04-02 NOTE — Op Note (Signed)
Patient:  Stacie Huang  DOB:  01-22-1941  MRN:  [id number]   Preop Diagnosis:  Ductal carcinoma in situ, right breast  Postop Diagnosis:  Same  Procedure:  Right partial mastectomy after needle localization  Surgeon:  Aviva Signs, M.D.  Anes:  Gen.  Indications:  Patient is a 75 year old white female with a history of a left breast carcinoma who now presents with a ductal carcinoma in situ of the right breast. After extensive discussion with the patient, she has elected to proceed with a right partial mastectomy after needle localization. The risks and benefits of the procedure including bleeding, infection, and the possibility of involved margins were fully explained to the patient, who gave informed consent.  Procedure note:  The patient was placed the supine position. She had already undergone right breast needle localization in the radiology department. After general anesthesia was administered, the right breast was prepped and draped using the usual sterile technique with ChloraPrep. Surgical site confirmation was performed.  The needle was located in the upper, outer quadrant of the right breast. An incision was made in this region and the dissection was taken down to the chest wall in order to include the area of concern. A short suture was placed superiorly and a long suture placed laterally for orientation purposes. The specimen was removed and sent to radiology. Specimen radiography revealed the clip and suspicious lesion to be within the specimen removed. The specimen was then sent to pathology further examination. Any bleeding was controlled using Bovie electrocautery and Arista. The subcutaneous layer was reapproximated using 3-0 Vicryl interrupted suture. The skin was closed using a 4-0 Vicryl subcuticular suture. 0.5% Sensorcaine was instilled into the surrounding wound. Dermabond was then applied.  All tape and needle counts were correct at the end of the procedure. The patient  was awakened and transferred to PACU in stable condition.    Complications:  None  EBL:  Minimal  Specimen:  Right breast tissue, short suture superior, long suture lateral

## 2016-04-02 NOTE — Discharge Instructions (Signed)
May restart coumadin tomorrow 04/03/16   Partial Mastectomy With or Without Axillary Lymph Node Removal, Care After Introduction Refer to this sheet in the next few weeks. These instructions provide you with information about caring for yourself after your procedure. Your health care provider may also give you more specific instructions. Your treatment has been planned according to current medical practices, but problems sometimes occur. Call your health care provider if you have any problems or questions after your procedure. What can I expect after the procedure? After your procedure, it is common to have:  Breast swelling.  Breast tenderness.  Stiffness in your arm or shoulder.  A change in the shape and feel of your breast. Follow these instructions at home: Bathing  Take sponge baths until your health care provider says that you can start showering or bathing.  Do not take baths, swim, or use a hot tub until your health care provider approves. Incision care  There are many different ways to close and cover an incision, including stitches, skin glue, and adhesive strips. Follow your health care provider's instructions about:  Incision care.  Bandage (dressing) changes and removal.  Incision closure removal.  Check your incision area every day for signs of infection. Watch for:  Redness, swelling, or pain.  Fluid, blood, or pus.  If you were sent home with a surgical drain in place, follow your health care provider's instructions for emptying it. Activity  Return to your normal activities as directed by your health care provider.  Avoid strenuous exercise.  Be careful to avoid any activities that could cause an injury to your arm on the side of your surgery.  Do not lift anything that is heavier than 10 lb (4.5 kg). Avoid lifting with the arm that is on the side of your surgery.  Do not carry heavy objects on your shoulder.  After your drain is removed, you  should perform exercises to keep your arm from getting stiff and swollen. Talk with your health care provider about which exercises are safe for you. General instructions  Take medicines only as directed by your health care provider.  Keep your dressing clean and dry.  You may eat what you usually do.  Wear a supportive bra as directed by your health care provider.  Keep your arm elevated when at rest.  Do not wear tight jewelry on your arm, wrist, or fingers on the side of your surgery.  Get checked for extra fluid around your lymph nodes (lymphedema) as often as told by your health care provider.  If you had any lymph nodes removed during your procedure, be sure to tell all of your health care providers. This is important information to share before you are involved in certain procedures, such as giving blood or having your blood pressure taken. Contact a health care provider if:  You have a fever.  Your pain medicine is not working.  Your swelling, weakness, or numbness in your arm has not improved after a few weeks.  You have new swelling in your breast or arm.  You have redness, swelling, or pain in your incision area.  You have fluid, blood, or pus coming from your incision. Get help right away if:  You have very bad pain in your breast or arm.  You have chest pain.  You have difficulty breathing. This information is not intended to replace advice given to you by your health care provider. Make sure you discuss any questions you have with  your health care provider. Document Released: 11/27/2003 Document Revised: 12/20/2015 Document Reviewed: 12/28/2013  2017 Elsevier  PATIENT INSTRUCTIONS POST-ANESTHESIA  IMMEDIATELY FOLLOWING SURGERY:  Do not drive or operate machinery for the first twenty four hours after surgery.  Do not make any important decisions for twenty four hours after surgery or while taking narcotic pain medications or sedatives.  If you develop  intractable nausea and vomiting or a severe headache please notify your doctor immediately.  FOLLOW-UP:  Please make an appointment with your surgeon as instructed. You do not need to follow up with anesthesia unless specifically instructed to do so.  WOUND CARE INSTRUCTIONS (if applicable):  Keep a dry clean dressing on the anesthesia/puncture wound site if there is drainage.  Once the wound has quit draining you may leave it open to air.  Generally you should leave the bandage intact for twenty four hours unless there is drainage.  If the epidural site drains for more than 36-48 hours please call the anesthesia department.  QUESTIONS?:  Please feel free to call your physician or the hospital operator if you have any questions, and they will be happy to assist you.

## 2016-04-03 ENCOUNTER — Telehealth: Payer: Self-pay | Admitting: *Deleted

## 2016-04-03 NOTE — Telephone Encounter (Signed)
Pt states she had breast surgery yesterday.  Was off coumadin 5 days.  Took last dose 03/28/16.  Is ready to go back on coumadin.  Told pt to take 2.5mg  x 2 days then resume 1.25mg  daily except 2.5mg  on Tuesdays and Fridays.  INR appt made for 04/16/16.  Pt verbalized understanding.

## 2016-04-04 ENCOUNTER — Encounter (HOSPITAL_COMMUNITY): Payer: Self-pay | Admitting: General Surgery

## 2016-04-07 ENCOUNTER — Other Ambulatory Visit (HOSPITAL_COMMUNITY): Payer: Self-pay | Admitting: Emergency Medicine

## 2016-04-07 NOTE — Progress Notes (Signed)
oncotype ordered, faxed to pathology, confirmed, faxed to [facility], confirmed.

## 2016-04-08 ENCOUNTER — Telehealth (HOSPITAL_COMMUNITY): Payer: Self-pay | Admitting: Emergency Medicine

## 2016-04-08 NOTE — Telephone Encounter (Signed)
Gave pt follow up appt, May 01, 2016 at 8:50 am.  I told her this may change if we do not have the results of her test back by then.  She verbalized understanding.

## 2016-04-09 ENCOUNTER — Other Ambulatory Visit: Payer: Self-pay | Admitting: Adult Health

## 2016-04-16 ENCOUNTER — Encounter: Payer: Self-pay | Admitting: *Deleted

## 2016-04-22 ENCOUNTER — Encounter (HOSPITAL_COMMUNITY): Payer: Self-pay

## 2016-04-30 ENCOUNTER — Ambulatory Visit (INDEPENDENT_AMBULATORY_CARE_PROVIDER_SITE_OTHER): Payer: Medicare Other | Admitting: *Deleted

## 2016-04-30 DIAGNOSIS — Z5181 Encounter for therapeutic drug level monitoring: Secondary | ICD-10-CM

## 2016-04-30 DIAGNOSIS — I4891 Unspecified atrial fibrillation: Secondary | ICD-10-CM

## 2016-04-30 LAB — POCT INR: INR: 2.8

## 2016-05-01 ENCOUNTER — Ambulatory Visit (HOSPITAL_COMMUNITY): Payer: Medicare Other | Admitting: Hematology & Oncology

## 2016-05-08 ENCOUNTER — Encounter (HOSPITAL_COMMUNITY): Payer: Self-pay | Admitting: Oncology

## 2016-05-08 ENCOUNTER — Encounter (HOSPITAL_COMMUNITY): Payer: Medicare Other | Attending: Hematology and Oncology | Admitting: Oncology

## 2016-05-08 VITALS — BP 112/65 | HR 72 | Temp 98.2°F | Resp 16 | Wt 144.0 lb

## 2016-05-08 DIAGNOSIS — Z170 Estrogen receptor positive status [ER+]: Secondary | ICD-10-CM | POA: Diagnosis not present

## 2016-05-08 DIAGNOSIS — M8580 Other specified disorders of bone density and structure, unspecified site: Secondary | ICD-10-CM | POA: Diagnosis not present

## 2016-05-08 DIAGNOSIS — C50911 Malignant neoplasm of unspecified site of right female breast: Secondary | ICD-10-CM

## 2016-05-08 DIAGNOSIS — C50212 Malignant neoplasm of upper-inner quadrant of left female breast: Secondary | ICD-10-CM | POA: Diagnosis not present

## 2016-05-08 HISTORY — DX: Malignant neoplasm of unspecified site of right female breast: C50.911

## 2016-05-08 NOTE — Progress Notes (Addendum)
[name], MD [place] [address]  Invasive ductal carcinoma of breast, female, right [facility]) - Plan: CBC with Differential, Comprehensive metabolic panel, Vitamin D 25 hydroxy, CBC with Differential, Comprehensive metabolic panel, MM DIAG BREAST TOMO BILATERAL  Malignant neoplasm of upper-inner quadrant of left breast in female, estrogen receptor positive ([place]) - Plan: CBC with Differential, Comprehensive metabolic panel, Vitamin D 25 hydroxy, CBC with Differential, Comprehensive metabolic panel, MM DIAG BREAST TOMO BILATERAL  Osteopenia determined by x-ray - Plan: Vitamin D 25 hydroxy  CURRENT THERAPY: Anastrazole beginning on 06/28/2013  INTERVAL HISTORY: Stacie Huang 76 y.o. female returns for followup of Stage IA (T1bN0M0) LEFT invasive breast cancer, ER positive, PR positive, 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 Now, S/P 12 months worth of Herceptin therapy every 3 weeks. Currently on Anastrozole started on 06/28/2013.  AND Stage IIA (T1bN0M0) RIGHT invasive breast cancer, ER/PR/HER2 NEGATIVE sound incidentally on annual mammogram in October 2017 followed by right breast lumpectomy by Dr. Arnoldo Morale on 04/02/2016.  Mammaprint on 05/05/2016 demonstrates LOW RISK disease. AND Osteopenia while on AI therapy.  Currently on Ca++, Vit D, and Prolia beginning on 10/08/2015.    Breast cancer of upper-inner quadrant of left female breast ([place])   02/04/2013 Initial Diagnosis    Breast cancer of upper-inner quadrant of left female breast      02/23/2013 Surgery    left lumpectomy (UIQ), sentinel node biopsy--0.7cm, Node negative, ER+, PR+, HER-2/neu over expressed.      04/08/2013 - 04/14/2013 Chemotherapy    Paclitaxel/Herceptin weekly x 12. Intolerance to Paclitaxel and only receiving 2 cycles      05/04/2013 - 04/05/2014 Chemotherapy    Herceptin every 21 days       06/28/2013 -   Chemotherapy    Anastrazole started      08/02/2013 Imaging    Bone density- normal      04/06/2014 Imaging    MUGA- Left ventricular ejection fraction equals 53%.      08/06/2015 Imaging    Bone density- osteopenia      03/06/2016 Procedure    Stereotactic-guided biopsy of right breast upper outer quadrant focal asymmetry/calcifications. No apparent complications.      03/06/2016 Procedure    Successful placement of coil shaped marker within the right breast upper outer quadrant biopsy site, post stereotactic core needle biopsy.       Invasive ductal carcinoma of breast, female, right ([place])   02/26/2016 Mammogram    1. Focal asymmetry within the upper-outer quadrant of the right breast, with associated new calcifications. This is a suspicious finding for which stereotactic biopsy with 3D tomosynthesis guidance is recommended. 2. Cluster of cysts at the 10 o'clock axis, 5 cm from the nipple, measuring 1.2 x 0.6 x 1.1 cm, a possible correlate for the mammographic asymmetry.      03/06/2016 Procedure    Right needle core biopsy      03/07/2016 Pathology Results    Breast, right, needle core biopsy, upper outer quadrant - HIGH GRADE DUCTAL CARCINOMA IN SITU WITH NECROSIS AND ASSOCIATED CALCIFICATIONS.IMMUNOHISTOCHEMICAL AND MORPHOMETRIC ANALYSIS PERFORMED MANUALLY Estrogen Receptor: 0%, NEGATIVE Progesterone Receptor: 0%, NEGATIVE      04/02/2016 Procedure    Breast, lumpectomy, right by Dr. Arnoldo Morale      04/07/2016 Pathology Results    INVASIVE DUCTAL CARCINOMA, GRADE 2, SPANNING 0.6 CM THE CARCINOMA IS FOCALLY PRESENTED AT  THE CAUTERIZED MEDIAL MARGIN EXTENSIVE DUCTAL CARCINOMA IN SITU, GRADE 3 ALL OTHER SURGICAL MARGIN ARE NEGATIVE FOR CARCINOMA ER 0% PR 0% Ki-67 15% HER2 NEGATIVE      05/05/2016 Pathology Results    MammaPrint: LOW RISK (97.8% of low risk Mammaprint patients who were treated with anti-hormonal therapy alone are living without distant  recurrence of breast cancer at 5-years).      She is here today for medical oncology recommendations regarding her newest breast cancer.  This was ER/PR negative and HER2 negative.    She denies any complaints.  She is healing nicely from her lumpectomy.  She denies any drainage from surgical site.  She denies any questions today.    I have reviewed the role of XRT in her cancer care.  I provided her very elementary education regarding radiation.  She is advised that it will be 6-7 weeks worth of treatment.  I reviewed the fact that fatigue is the most common side effect of radiation therapy.  Review of Systems  Constitutional: Negative.  Negative for chills, fever and weight loss.  HENT: Negative.   Eyes: Negative.   Respiratory: Negative.  Negative for cough.   Cardiovascular: Negative.  Negative for chest pain.  Gastrointestinal: Negative.  Negative for nausea and vomiting.  Genitourinary: Negative.  Negative for dysuria.  Musculoskeletal: Negative.  Negative for falls.  Skin: Negative.  Negative for rash.  Neurological: Negative.  Negative for weakness.  Endo/Heme/Allergies: Negative.   Psychiatric/Behavioral: Negative.     Past Medical History:  Diagnosis Date  . Anxiety   . Breast cancer ([place])   . Cancer ([place])   . Chronic anticoagulation   . GERD (gastroesophageal reflux disease)   . Hyperlipidemia   . Hypertension   . Invasive ductal carcinoma of breast, female, right ([place]) 05/08/2016  . Osteopenia determined by x-ray 10/03/2015  . Paroxysmal atrial fibrillation [facility])     Past Surgical History:  Procedure Laterality Date  . ABDOMINAL HYSTERECTOMY    . AXILLARY LYMPH NODE DISSECTION Left 02/23/2013   Procedure: LEFT AXILLARY LYMPH NODE DISSECTION;  Surgeon: Scherry Ran, MD;  Location: [facility];  Service: General;  Laterality: Left;  . CHOLECYSTECTOMY    . COLONOSCOPY  03/10/2002   RMR: Incomplete colonoscopy (sigmoidoscopy)/Normal rectum/Normal colon to 40 cm.    . ESOPHAGOGASTRODUODENOSCOPY  03/10/2002   ZDG:LOVFIE esophagus/small HH/Tiny AVM in antrum, otherwise normal stomach and D1 and D2/Status post passage of the [facility] dilator  . PARTIAL MASTECTOMY WITH AXILLARY SENTINEL LYMPH NODE BIOPSY Left 02/23/2013   Procedure: LEFT PARTIAL MASTECTOMY WITH AXILLARY SIMPLE NODE BIOPSY, LEFT BREAST WIDE EXCISION;  Surgeon: Scherry Ran, MD;  Location: [facility]S;  Service: General;  Laterality: Left;  . PARTIAL MASTECTOMY WITH NEEDLE LOCALIZATION Right 04/02/2016   Procedure: RIGHT PARTIAL MASTECTOMY AFTER NEEDLE LOCALIZATION;  Surgeon: Aviva Signs, MD;  Location: [facility]S;  Service: General;  Laterality: Right;  . PORTACATH PLACEMENT Right 04/07/2013  . PORTACATH PLACEMENT Right 04/07/2013   Procedure: INSERTION PORT-A-CATH;  Surgeon: Scherry Ran, MD;  Location: [facility] ORS;  Service: General;  Laterality: Right;    Family History  Problem Relation Age of Onset  . Heart disease Mother     Also 1 of 9 siblings  . Heart attack Father   . Leukemia    . Leukemia    . Colon cancer Neg Hx     Social History   Social History  . Marital status: Legally Separated    Spouse name: N/A  .  Number of children:  2  . Years of education: N/A   Occupational History  . Retired from [employer]  . Smoking status: Never Smoker  . Smokeless tobacco: Never Used  . Alcohol use No  . Drug use: No  . Sexual activity: Yes    Birth control/ protection: Surgical   Other Topics Concern  . None   Social History Narrative   2 adult children     PHYSICAL EXAMINATION  ECOG PERFORMANCE STATUS: 0 - Asymptomatic  Vitals:   05/08/16 0933  BP: 112/65  Pulse: 72  Resp: 16  Temp: 98.2 F (36.8 C)    GENERAL:alert, no distress, well nourished, well developed, comfortable, cooperative, smiling and unaccompanied SKIN: skin color, texture, turgor are normal, no rashes or significant lesions HEAD: Normocephalic, No  masses, lesions, tenderness or abnormalities EYES: normal, EOMI, Conjunctiva are pink and non-injected EARS: External ears normal OROPHARYNX:lips, buccal mucosa, and tongue normal and mucous membranes are moist  NECK: supple, no adenopathy, trachea midline LYMPH:  no palpable lymphadenopathy BREAST:right breast lumpectomy site healed without any erythema or drainage. LUNGS: clear to auscultation and percussion HEART: regular rate & rhythm, no murmurs, no gallops, S1 normal and S2 normal ABDOMEN:abdomen soft and normal bowel sounds BACK: Back symmetric, no curvature. EXTREMITIES:less then 2 second capillary refill, no joint deformities, effusion, or inflammation, no skin discoloration, no clubbing  NEURO: alert & oriented x 3 with fluent speech, no focal motor/sensory deficits, gait normal   LABORATORY DATA: CBC    Component Value Date/Time   WBC 7.7 03/31/2016 1534   RBC 4.16 03/31/2016 1534   HGB 12.3 03/31/2016 1534   HCT 38.9 03/31/2016 1534   PLT 265 03/31/2016 1534   MCV 93.5 03/31/2016 1534   MCH 29.6 03/31/2016 1534   MCHC 31.6 03/31/2016 1534   RDW 14.1 03/31/2016 1534   LYMPHSABS 3.5 03/31/2016 1534   MONOABS 0.8 03/31/2016 1534   EOSABS 0.2 03/31/2016 1534   BASOSABS 0.0 03/31/2016 1534      Chemistry      Component Value Date/Time   NA 137 03/31/2016 1534   K 3.9 03/31/2016 1534   CL 105 03/31/2016 1534   CO2 24 03/31/2016 1534   BUN 17 03/31/2016 1534   CREATININE 0.94 03/31/2016 1534      Component Value Date/Time   CALCIUM 9.1 03/31/2016 1534   ALKPHOS 41 03/31/2016 1534   AST 20 03/31/2016 1534   ALT 14 03/31/2016 1534   BILITOT 0.6 03/31/2016 1534        PENDING LABS:   RADIOGRAPHIC STUDIES:  No results found.   PATHOLOGY:              ASSESSMENT AND PLAN:  Breast cancer of upper-inner quadrant of left female breast Stage IA invasive breast cancer, ER positive, PR positive, HER-2/neu over amplified; S/P Taxol/Herceptin and  after 2 cycles of this regimen develop an intolerance to Taxol. S/P 12 months worth of Herceptin therapy every 3 weeks. Currently on Anastrozole beginning on 06/28/2013.     Oncology history is up-to-date.  Labs with next port flush on 05/27/2016: CBC diff, CMET, Vit D level.  I personally reviewed and went over laboratory results with the patient.  The results are noted within this dictation.  Compliance with Anastrozole encouraged.   Next mammogram is ordered for October 2018.  Labs in 4 months: CBC diff, CMET.    Invasive ductal carcinoma of breast, female, right (  [place]) Stage IIA (T1bN0M0) RIGHT invasive breast cancer, ER/PR/HER2 NEGATIVE sound incidentally on annual mammogram in October 2017 followed by right breast lumpectomy by Dr. Arnoldo Morale on 04/02/2016.  Mammaprint on 05/05/2016 demonstrates LOW RISK disease.  Oncology history developed.  Staging in [facility] problem list completed.  I personally reviewed and went over pathology results with the patient.  Mammaprint demonstrated low risk and therefore I reviewed this information with the patient.  She refuses a copy of this result for herself.  I do not see that she has been referred to XRT to date, so I will make sure this is done today.  No role for change in therapy given low-risk disease.  Return in 8-12 weeks for follow-up.  Osteopenia determined by x-ray Osteopenia while on AI therapy.  Currently on Ca++, Vit D, and Prolia beginning on 10/08/2015.  Overdue for Prolia.  Will be given in 2 weeks with her port flush, labs permitting.   ORDERS PLACED FOR THIS ENCOUNTER: Orders Placed This Encounter  Procedures  . MM DIAG BREAST TOMO BILATERAL  . CBC with Differential  . Comprehensive metabolic panel  . Vitamin D 25 hydroxy  . CBC with Differential  . Comprehensive metabolic panel    MEDICATIONS PRESCRIBED THIS ENCOUNTER: No orders of the defined types were placed in this encounter.   THERAPY PLAN:  Refer to XRT for  definitive treatment of new right breast cancer.  NCCN guidelines recommends the following surveillance for invasive breast cancer (2.2017):  A. History and Physical exam 1-4 times per year as clinically appropriate for 5 years, then annually.  B. Periodic screening for changes in family history and referral to genetics counseling as indicated  C. Educate, monitor, and refer to lymphedema management.  D. Mammography every 12 months  E. Routine imaging of reconstructed breast is not indicated.  F. In the absence of clinical signs and symptoms suggestive of recurrent disease, there is no indication for laboratory or imaging studies for metastases screening.  G. Women on Tamoxifen: annual gynecologic assessment every 12 months if uterus is present.  H. Women on aromatase inhibitor or who experience ovarian failure secondary to treatment should have monitoring of bone health with a bone mineral density determination at baseline and periodically thereafter.  I. Assess and encourage adherence to adjuvant endocrine therapy.  J. Evidence suggests that active lifestyle, healthy diet, limited alcohol intake, and achieving and maintaining an ideal body weight (20-25 BMI) may lead to optimal breast cancer outcomes.   All questions were answered. The patient knows to call the clinic with any problems, questions or concerns. We can certainly see the patient much sooner if necessary.  Patient and plan discussed with Dr. Ancil Linsey and she is in agreement with the aforementioned.   This note is electronically signed by: Doy Mince 05/08/2016 5:23 PM

## 2016-05-08 NOTE — Assessment & Plan Note (Signed)
Osteopenia while on AI therapy.  Currently on Ca++, Vit D, and Prolia beginning on 10/08/2015.  Overdue for Prolia.  Will be given in 2 weeks with her port flush, labs permitting.

## 2016-05-08 NOTE — Assessment & Plan Note (Addendum)
Stage IIA (T1bN0M0) RIGHT invasive breast cancer, ER/PR/HER2 NEGATIVE sound incidentally on annual mammogram in October 2017 followed by right breast lumpectomy by Dr. Arnoldo Morale on 04/02/2016.  Mammaprint on 05/05/2016 demonstrates LOW RISK disease.  Oncology history developed.  Staging in [facility] problem list completed.  I personally reviewed and went over pathology results with the patient.  Mammaprint demonstrated low risk and therefore I reviewed this information with the patient.  She refuses a copy of this result for herself.  I do not see that she has been referred to XRT to date, so I will make sure this is done today.  No role for change in therapy given low-risk disease.  Return in 8-12 weeks for follow-up.

## 2016-05-08 NOTE — Assessment & Plan Note (Addendum)
Stage IA invasive breast cancer, ER positive, PR positive, 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 S/P 12 months worth of Herceptin therapy every 3 weeks. Currently on Anastrozole beginning on 06/28/2013.     Oncology history is up-to-date.  Labs with next port flush on 05/27/2016: CBC diff, CMET, Vit D level.  I personally reviewed and went over laboratory results with the patient.  The results are noted within this dictation.  Compliance with Anastrozole encouraged.   Next mammogram is ordered for October 2018.  Labs in 4 months: CBC diff, CMET.

## 2016-05-08 NOTE — Patient Instructions (Addendum)
[place] at [facility] Discharge Instructions  RECOMMENDATIONS MADE BY THE CONSULTANT AND ANY TEST RESULTS WILL BE SENT TO YOUR REFERRING PHYSICIAN.  You saw Kirby Crigler, PA-C, today. Will refer you to radiation in [place]. Continue Arimidex. Prolia and port flush in 2 weeks. Port flush every 6 weeks. Prolia every 6 months. Follow up in 8-12 weeks. See Amy at checkout for appointments.  Thank you for choosing [place] at [facility] to provide your oncology and hematology care.  To afford each patient quality time with our provider, please arrive at least 15 minutes before your scheduled appointment time.    If you have a lab appointment with the [facility] please come in thru the  Main Entrance and check in at the main information desk  You need to re-schedule your appointment should you arrive 10 or more minutes late.  We strive to give you quality time with our providers, and arriving late affects you and other patients whose appointments are after yours.  Also, if you no show three or more times for appointments you may be dismissed from the clinic at the providers discretion.     Again, thank you for choosing [facility].  Our hope is that these requests will decrease the amount of time that you wait before being seen by our physicians.       _____________________________________________________________  Should you have questions after your visit to [facility], please contact our office at [phone] between the hours of 8:30 a.m. and 4:30 p.m.  Voicemails left after 4:30 p.m. will not be returned until the following business day.  For prescription refill requests, have your pharmacy contact our office.       Resources For Cancer Patients and their Caregivers ? American Cancer Society: Can assist with transportation, wigs, general needs, runs Look Good Feel Better.        (780)533-2141 ? Cancer  Care: Provides financial assistance, online support groups, medication/co-pay assistance.  1-800-813-HOPE (782) 635-0504) ? [place] Assists [place] cancer patients and their families through emotional , educational and financial support.  (607) 408-0360 ? [place] DSS Where to apply for food stamps, Medicaid and utility assistance. 364-842-1079 ? [facility]: Transportation to medical appointments. 610-085-9841 ? Social Security Administration: May apply for disability if have a Stage IV cancer. 825-316-6959 534-702-8022 ? [employer], Disability and Transit Services: Assists with nutrition, care and transit needs. [place] Support Programs: @10RELATIVEDAYS @ > Cancer Support Group  2nd Tuesday of the month 1pm-2pm, Journey Room  > Creative Journey  3rd Tuesday of the month 1130am-1pm, Journey Room  > Look Good Feel Better  1st Wednesday of the month 10am-12 noon, Journey Room (Call [place] to register [phone])

## 2016-05-17 ENCOUNTER — Other Ambulatory Visit: Payer: Self-pay | Admitting: Adult Health

## 2016-05-17 ENCOUNTER — Other Ambulatory Visit (HOSPITAL_COMMUNITY): Payer: Self-pay | Admitting: Oncology

## 2016-05-20 ENCOUNTER — Encounter: Payer: Self-pay | Admitting: Cardiology

## 2016-05-20 ENCOUNTER — Ambulatory Visit (INDEPENDENT_AMBULATORY_CARE_PROVIDER_SITE_OTHER): Payer: Medicare Other | Admitting: Cardiology

## 2016-05-20 VITALS — BP 116/77 | HR 68 | Ht 61.5 in | Wt 143.0 lb

## 2016-05-20 DIAGNOSIS — I4891 Unspecified atrial fibrillation: Secondary | ICD-10-CM | POA: Diagnosis not present

## 2016-05-20 DIAGNOSIS — I10 Essential (primary) hypertension: Secondary | ICD-10-CM | POA: Diagnosis not present

## 2016-05-20 DIAGNOSIS — E785 Hyperlipidemia, unspecified: Secondary | ICD-10-CM

## 2016-05-20 NOTE — Progress Notes (Signed)
Clinical Summary Stacie Huang is a 76 y.o.female last seen by NP Purcell Nails, this is our first visit together. She is seen for the following medical problems.   1. Afib - no recent palpitations - compliant with meds - no bleeding troubles on coumadin, followed in our coumadin clinic. - not interested in NOACs  2. HTN - complinat with meds  3. Hyperlipidemia - no recent panel in our system - compliant with statin  4. Breast cancer  Past Medical History:  Diagnosis Date  . Anxiety   . Breast cancer ([place])   . Cancer ([place])   . Chronic anticoagulation   . GERD (gastroesophageal reflux disease)   . Hyperlipidemia   . Hypertension   . Invasive ductal carcinoma of breast, female, right ([place]) 05/08/2016  . Osteopenia determined by x-ray 10/03/2015  . Paroxysmal atrial fibrillation ([facility])      Allergies  Allergen Reactions  . Iohexol Hives  . Sulfa Antibiotics Rash     Current Outpatient Prescriptions  Medication Sig Dispense Refill  . acetaminophen (TYLENOL) 500 MG tablet Take 1,000 mg by mouth daily.    Marland Kitchen anastrozole (ARIMIDEX) 1 MG tablet TAKE ONE TABLET BY MOUTH DAILY. 30 tablet 5  . atenolol (TENORMIN) 100 MG tablet Take 50 mg by mouth daily before breakfast.     . calcium carbonate (OS-CAL) 600 MG TABS tablet Take 600 mg by mouth 2 (two) times daily with a meal.     . cetirizine (ZYRTEC) 10 MG tablet Take 10 mg by mouth every morning.     . Cholecalciferol (VITAMIN D) 400 UNITS capsule Take 400 Units by mouth 3 (three) times daily.     Marland Kitchen denosumab (PROLIA) 60 MG/ML SOLN injection Inject 60 mg into the skin every 6 (six) months. Administer in upper arm, thigh, or abdomen    . diltiazem (CARDIZEM CD) 120 MG 24 hr capsule TAKE 1 CAPSULE BY MOUTH EVERY DAY. 90 capsule 0  . fluticasone (FLONASE) 50 MCG/ACT nasal spray Place 2 sprays into the nose at bedtime.     Marland Kitchen HYDROcodone-acetaminophen (NORCO) 5-325 MG tablet Take 1 tablet by mouth every 6 (six) hours as needed for  moderate pain. 40 tablet 0  . LORazepam (ATIVAN) 1 MG tablet Take 1 mg by mouth daily as needed for anxiety (AND/OR FOR SLEEP).     Marland Kitchen losartan (COZAAR) 50 MG tablet Take 50 mg by mouth every morning.     . metoCLOPramide (REGLAN) 10 MG tablet TAKE 1 TABLET BY MOUTH EVERY 8 HOURS AS NEEDED FOR NAUSEA. 120 tablet 0  . pantoprazole (PROTONIX) 40 MG tablet TAKE (1) TABLET BY MOUTH TWICE DAILY. 60 tablet 5  . psyllium (METAMUCIL) 58.6 % powder Take 1 packet by mouth daily.     . simvastatin (ZOCOR) 10 MG tablet Take 10 mg by mouth at bedtime.      Marland Kitchen warfarin (COUMADIN) 2.5 MG tablet TAKE ONE TABLET BY MOUTH ON TUESDAYS AND FRIDAYS; TAKE 1/2 TABLET ON ALL OTHER DAYS. 30 tablet 3   No current facility-administered medications for this visit.      Past Surgical History:  Procedure Laterality Date  . ABDOMINAL HYSTERECTOMY    . AXILLARY LYMPH NODE DISSECTION Left 02/23/2013   Procedure: LEFT AXILLARY LYMPH NODE DISSECTION;  Surgeon: Scherry Ran, MD;  Location: [facility];  Service: General;  Laterality: Left;  . CHOLECYSTECTOMY    . COLONOSCOPY  03/10/2002   RMR: Incomplete colonoscopy (sigmoidoscopy)/Normal rectum/Normal colon to 40 cm.   Marland Kitchen  ESOPHAGOGASTRODUODENOSCOPY  03/10/2002   [id number] esophagus/small HH/Tiny AVM in antrum, otherwise normal stomach and D1 and D2/Status post passage of the [facility] dilator  . PARTIAL MASTECTOMY WITH AXILLARY SENTINEL LYMPH NODE BIOPSY Left 02/23/2013   Procedure: LEFT PARTIAL MASTECTOMY WITH AXILLARY SIMPLE NODE BIOPSY, LEFT BREAST WIDE EXCISION;  Surgeon: Scherry Ran, MD;  Location: [facility];  Service: General;  Laterality: Left;  . PARTIAL MASTECTOMY WITH NEEDLE LOCALIZATION Right 04/02/2016   Procedure: RIGHT PARTIAL MASTECTOMY AFTER NEEDLE LOCALIZATION;  Surgeon: Aviva Signs, MD;  Location: [facility];  Service: General;  Laterality: Right;  . PORTACATH PLACEMENT Right 04/07/2013  . PORTACATH PLACEMENT Right 04/07/2013   Procedure: INSERTION  PORT-A-CATH;  Surgeon: Scherry Ran, MD;  Location: [facility];  Service: General;  Laterality: Right;     Allergies  Allergen Reactions  . Iohexol Hives  . Sulfa Antibiotics Rash      Family History  Problem Relation Age of Onset  . Heart disease Mother     Also 68 of 9 siblings  . Heart attack Father   . Leukemia    . Leukemia    . Colon cancer Neg Hx      Social History Ms. Ficarra reports that she has never smoked. She has never used smokeless tobacco. Ms. Feliciano reports that she does not drink alcohol.   Review of Systems CONSTITUTIONAL: No weight loss, fever, chills, weakness or fatigue.  HEENT: Eyes: No visual loss, blurred vision, double vision or yellow sclerae.No hearing loss, sneezing, congestion, runny nose or sore throat.  SKIN: No rash or itching.  CARDIOVASCULAR: per HPI RESPIRATORY: No shortness of breath, cough or sputum.  GASTROINTESTINAL: No anorexia, nausea, vomiting or diarrhea. No abdominal pain or blood.  GENITOURINARY: No burning on urination, no polyuria NEUROLOGICAL: No headache, dizziness, syncope, paralysis, ataxia, numbness or tingling in the extremities. No change in bowel or bladder control.  MUSCULOSKELETAL: No muscle, back pain, joint pain or stiffness.  LYMPHATICS: No enlarged nodes. No history of splenectomy.  PSYCHIATRIC: No history of depression or anxiety.  ENDOCRINOLOGIC: No reports of sweating, cold or heat intolerance. No polyuria or polydipsia.  Marland Kitchen   Physical Examination Vitals:   05/20/16 1250  BP: 116/77  Pulse: 68   Vitals:   05/20/16 1250  Weight: 143 lb (64.9 kg)  Height: 5' 1.5" (1.562 m)    Gen: resting comfortably, no acute distress HEENT: no scleral icterus, pupils equal round and reactive, no palptable cervical adenopathy,  CV: RRR, no m/r/g, no jvd Resp: Clear to auscultation bilaterally GI: abdomen is soft, non-tender, non-distended, normal bowel sounds, no hepatosplenomegaly MSK: extremities are warm, no  edema.  Skin: warm, no rash Neuro:  no focal deficits Psych: appropriate affect      Assessment and Plan  1. Afib - no current symptoms, continue current meds - CHADS2Vasc score is 4, continue coumadin  2. HTN - at goal, continue current meds  3. Hyperlipidemia - repeat lipid panel, continue current statin.       Arnoldo Lenis, M.D.

## 2016-05-20 NOTE — Patient Instructions (Signed)
Medication Instructions:  Your physician recommends that you continue on your current medications as directed. Please refer to the Current Medication list given to you today.   Labwork: Your physician recommends that you return for lab work [id number] FASTING LIPID    Testing/Procedures: NONE  Follow-Up: Your physician wants you to follow-up in: 1 YEAR .  You will receive a reminder letter in the mail two months in advance. If you don't receive a letter, please call our office to schedule the follow-up appointment.   Any Other Special Instructions Will Be Listed Below (If Applicable).     If you need a refill on your cardiac medications before your next appointment, please call your pharmacy.

## 2016-05-24 LAB — LIPID PANEL
CHOL/HDL RATIO: 2.9 ratio (ref <5.0)
CHOLESTEROL: 140 mg/dL (ref <200)
HDL: 49 mg/dL — AB (ref >50)
LDL Cholesterol: 54 mg/dL (ref <100)
Triglycerides: 187 mg/dL — ABNORMAL HIGH (ref <150)
VLDL: 37 mg/dL — ABNORMAL HIGH (ref <30)

## 2016-05-26 ENCOUNTER — Encounter (HOSPITAL_COMMUNITY): Payer: Self-pay

## 2016-05-27 ENCOUNTER — Encounter (HOSPITAL_BASED_OUTPATIENT_CLINIC_OR_DEPARTMENT_OTHER): Payer: Medicare Other

## 2016-05-27 ENCOUNTER — Encounter (HOSPITAL_COMMUNITY): Payer: Self-pay

## 2016-05-27 VITALS — BP 114/65 | HR 70 | Temp 98.1°F | Resp 18

## 2016-05-27 DIAGNOSIS — Z95828 Presence of other vascular implants and grafts: Secondary | ICD-10-CM

## 2016-05-27 DIAGNOSIS — C50911 Malignant neoplasm of unspecified site of right female breast: Secondary | ICD-10-CM

## 2016-05-27 DIAGNOSIS — C50212 Malignant neoplasm of upper-inner quadrant of left female breast: Secondary | ICD-10-CM | POA: Diagnosis not present

## 2016-05-27 DIAGNOSIS — M8580 Other specified disorders of bone density and structure, unspecified site: Secondary | ICD-10-CM | POA: Diagnosis present

## 2016-05-27 DIAGNOSIS — Z170 Estrogen receptor positive status [ER+]: Secondary | ICD-10-CM

## 2016-05-27 DIAGNOSIS — Z79899 Other long term (current) drug therapy: Secondary | ICD-10-CM

## 2016-05-27 LAB — COMPREHENSIVE METABOLIC PANEL
ALBUMIN: 3.9 g/dL (ref 3.5–5.0)
ALT: 15 U/L (ref 14–54)
ANION GAP: 8 (ref 5–15)
AST: 21 U/L (ref 15–41)
Alkaline Phosphatase: 37 U/L — ABNORMAL LOW (ref 38–126)
BILIRUBIN TOTAL: 0.7 mg/dL (ref 0.3–1.2)
BUN: 14 mg/dL (ref 6–20)
CO2: 25 mmol/L (ref 22–32)
Calcium: 8.9 mg/dL (ref 8.9–10.3)
Chloride: 102 mmol/L (ref 101–111)
Creatinine, Ser: 1.04 mg/dL — ABNORMAL HIGH (ref 0.44–1.00)
GFR calc non Af Amer: 51 mL/min — ABNORMAL LOW (ref >60)
GFR, EST AFRICAN AMERICAN: 59 mL/min — AB (ref >60)
GLUCOSE: 121 mg/dL — AB (ref 65–99)
Potassium: 4.1 mmol/L (ref 3.5–5.1)
Sodium: 135 mmol/L (ref 135–145)
TOTAL PROTEIN: 6.6 g/dL (ref 6.5–8.1)

## 2016-05-27 LAB — CBC WITH DIFFERENTIAL/PLATELET
BASOS PCT: 1 %
Basophils Absolute: 0 10*3/uL (ref 0.0–0.1)
EOS ABS: 0.1 10*3/uL (ref 0.0–0.7)
Eosinophils Relative: 2 %
HEMATOCRIT: 38 % (ref 36.0–46.0)
Hemoglobin: 12.4 g/dL (ref 12.0–15.0)
Lymphocytes Relative: 31 %
Lymphs Abs: 2.2 10*3/uL (ref 0.7–4.0)
MCH: 30.2 pg (ref 26.0–34.0)
MCHC: 32.6 g/dL (ref 30.0–36.0)
MCV: 92.7 fL (ref 78.0–100.0)
MONO ABS: 0.8 10*3/uL (ref 0.1–1.0)
MONOS PCT: 11 %
NEUTROS ABS: 4 10*3/uL (ref 1.7–7.7)
Neutrophils Relative %: 55 %
Platelets: 297 10*3/uL (ref 150–400)
RBC: 4.1 MIL/uL (ref 3.87–5.11)
RDW: 13.9 % (ref 11.5–15.5)
WBC: 7.2 10*3/uL (ref 4.0–10.5)

## 2016-05-27 MED ORDER — HEPARIN SOD (PORK) LOCK FLUSH 100 UNIT/ML IV SOLN
INTRAVENOUS | Status: AC
  Filled 2016-05-27: qty 5

## 2016-05-27 MED ORDER — HEPARIN SOD (PORK) LOCK FLUSH 100 UNIT/ML IV SOLN
500.0000 [IU] | Freq: Once | INTRAVENOUS | Status: AC
  Administered 2016-05-27: 500 [IU] via INTRAVENOUS

## 2016-05-27 MED ORDER — DENOSUMAB 60 MG/ML ~~LOC~~ SOLN
60.0000 mg | Freq: Once | SUBCUTANEOUS | Status: AC
  Administered 2016-05-27: 60 mg via SUBCUTANEOUS
  Filled 2016-05-27: qty 1

## 2016-05-27 MED ORDER — SODIUM CHLORIDE 0.9% FLUSH
10.0000 mL | INTRAVENOUS | Status: DC | PRN
  Administered 2016-05-27: 10 mL via INTRAVENOUS
  Filled 2016-05-27: qty 10

## 2016-05-27 NOTE — Patient Instructions (Signed)
[place] at [facility] Discharge Instructions  RECOMMENDATIONS MADE BY THE CONSULTANT AND ANY TEST RESULTS WILL BE SENT TO YOUR REFERRING PHYSICIAN.  Port flush with labs and prolia injection today. Return as scheduled.   Thank you for choosing [place] at [facility] to provide your oncology and hematology care.  To afford each patient quality time with our provider, please arrive at least 15 minutes before your scheduled appointment time.    If you have a lab appointment with the [place] please come in thru the  Main Entrance and check in at the main information desk  You need to re-schedule your appointment should you arrive 10 or more minutes late.  We strive to give you quality time with our providers, and arriving late affects you and other patients whose appointments are after yours.  Also, if you no show three or more times for appointments you may be dismissed from the clinic at the providers discretion.     Again, thank you for choosing [facility].  Our hope is that these requests will decrease the amount of time that you wait before being seen by our physicians.       _____________________________________________________________  Should you have questions after your visit to [facility], please contact our office at [phone] between the hours of 8:30 a.m. and 4:30 p.m.  Voicemails left after 4:30 p.m. will not be returned until the following business day.  For prescription refill requests, have your pharmacy contact our office.       Resources For Cancer Patients and their Caregivers ? American Cancer Society: Can assist with transportation, wigs, general needs, runs Look Good Feel Better.        (913)077-2380 ? Cancer Care: Provides financial assistance, online support groups, medication/co-pay assistance.  1-800-813-HOPE (807) 083-5781) ? [place] Assists [place] cancer patients and their families through emotional , educational and financial support.  316-121-3245 ? [place] DSS Where to apply for food stamps, Medicaid and utility assistance. (517) 166-7230 ? RCATS: Transportation to medical appointments. 845-181-4932 ? Social Security Administration: May apply for disability if have a Stage IV cancer. 609-731-3807 647-092-8662 ? [employer], Disability and Transit Services: Assists with nutrition, care and transit needs. [place] Support Programs: @10RELATIVEDAYS @ > Cancer Support Group  2nd Tuesday of the month 1pm-2pm, Journey Room  > Creative Journey  3rd Tuesday of the month 1130am-1pm, Journey Room  > Look Good Feel Better  1st Wednesday of the month 10am-12 noon, Journey Room (Call [place] to register [phone])

## 2016-05-27 NOTE — Progress Notes (Signed)
Stacie Huang presented for Portacath access and flush.  Portacath located right chest wall accessed with  H 20 needle.  Good blood return present. Portacath flushed with 7ml NS and 500U/40ml Heparin and needle removed intact.  Procedure tolerated well and without incident.    Stacie Huang presents today for injection per the provider's orders.  Prolia administration without incident; see MAR for injection details.  Patient tolerated procedure well and without incident.  No questions or complaints noted at this time.

## 2016-05-28 LAB — VITAMIN D 25 HYDROXY (VIT D DEFICIENCY, FRACTURES): Vit D, 25-Hydroxy: 46.2 ng/mL (ref 30.0–100.0)

## 2016-06-09 ENCOUNTER — Telehealth: Payer: Self-pay | Admitting: *Deleted

## 2016-06-09 NOTE — Telephone Encounter (Signed)
Told pt radiation would not affect coumadin like chemotherapy.  INR appt rescheduled for 06/16/16.

## 2016-06-09 NOTE — Telephone Encounter (Signed)
Patient would like to speak with you regarding radiation interferring with her Coumadin. / tg

## 2016-06-16 ENCOUNTER — Ambulatory Visit (INDEPENDENT_AMBULATORY_CARE_PROVIDER_SITE_OTHER): Payer: Medicare Other | Admitting: *Deleted

## 2016-06-16 DIAGNOSIS — I4891 Unspecified atrial fibrillation: Secondary | ICD-10-CM

## 2016-06-16 DIAGNOSIS — Z5181 Encounter for therapeutic drug level monitoring: Secondary | ICD-10-CM | POA: Diagnosis not present

## 2016-06-16 LAB — POCT INR: INR: 3.1

## 2016-06-18 ENCOUNTER — Other Ambulatory Visit (HOSPITAL_COMMUNITY): Payer: Self-pay | Admitting: Oncology

## 2016-07-14 ENCOUNTER — Ambulatory Visit (INDEPENDENT_AMBULATORY_CARE_PROVIDER_SITE_OTHER): Payer: Medicare Other | Admitting: *Deleted

## 2016-07-14 DIAGNOSIS — I4891 Unspecified atrial fibrillation: Secondary | ICD-10-CM | POA: Diagnosis not present

## 2016-07-14 DIAGNOSIS — Z5181 Encounter for therapeutic drug level monitoring: Secondary | ICD-10-CM | POA: Diagnosis not present

## 2016-07-14 LAB — POCT INR: INR: 2.6

## 2016-07-18 ENCOUNTER — Other Ambulatory Visit (HOSPITAL_COMMUNITY): Payer: Self-pay | Admitting: Oncology

## 2016-07-21 ENCOUNTER — Encounter (HOSPITAL_COMMUNITY): Payer: Medicare Other | Attending: Hematology and Oncology | Admitting: Oncology

## 2016-07-21 ENCOUNTER — Encounter (HOSPITAL_COMMUNITY): Payer: Medicare Other

## 2016-07-21 ENCOUNTER — Encounter (HOSPITAL_COMMUNITY): Payer: Self-pay

## 2016-07-21 VITALS — BP 113/71 | HR 95 | Temp 97.9°F | Resp 16 | Wt 142.2 lb

## 2016-07-21 DIAGNOSIS — Z170 Estrogen receptor positive status [ER+]: Secondary | ICD-10-CM

## 2016-07-21 DIAGNOSIS — C50912 Malignant neoplasm of unspecified site of left female breast: Secondary | ICD-10-CM | POA: Diagnosis not present

## 2016-07-21 DIAGNOSIS — C50911 Malignant neoplasm of unspecified site of right female breast: Secondary | ICD-10-CM

## 2016-07-21 DIAGNOSIS — D0511 Intraductal carcinoma in situ of right breast: Secondary | ICD-10-CM

## 2016-07-21 DIAGNOSIS — N951 Menopausal and female climacteric states: Secondary | ICD-10-CM

## 2016-07-21 DIAGNOSIS — Z95828 Presence of other vascular implants and grafts: Secondary | ICD-10-CM

## 2016-07-21 DIAGNOSIS — C50212 Malignant neoplasm of upper-inner quadrant of left female breast: Secondary | ICD-10-CM | POA: Insufficient documentation

## 2016-07-21 DIAGNOSIS — M8580 Other specified disorders of bone density and structure, unspecified site: Secondary | ICD-10-CM | POA: Diagnosis not present

## 2016-07-21 MED ORDER — HEPARIN SOD (PORK) LOCK FLUSH 100 UNIT/ML IV SOLN
500.0000 [IU] | Freq: Once | INTRAVENOUS | Status: AC
  Administered 2016-07-21: 500 [IU] via INTRAVENOUS
  Filled 2016-07-21: qty 5

## 2016-07-21 MED ORDER — SODIUM CHLORIDE 0.9% FLUSH
10.0000 mL | Freq: Once | INTRAVENOUS | Status: AC
  Administered 2016-07-21: 10 mL via INTRAVENOUS

## 2016-07-21 NOTE — Patient Instructions (Addendum)
[facility] at [facility] Discharge Instructions  RECOMMENDATIONS MADE BY THE CONSULTANT AND ANY TEST RESULTS WILL BE SENT TO YOUR REFERRING PHYSICIAN.  You were seen today by Dr. Louise Zhou Follow up in 3months See Amy up front for appointments   Thank you for choosing [facility] at [facility] to provide your oncology and hematology care.  To afford each patient quality time with our provider, please arrive at least 15 minutes before your scheduled appointment time.    If you have a lab appointment with the Cancer Center please come in thru the  Main Entrance and check in at the main information desk  You need to re-schedule your appointment should you arrive 10 or more minutes late.  We strive to give you quality time with our providers, and arriving late affects you and other patients whose appointments are after yours.  Also, if you no show three or more times for appointments you may be dismissed from the clinic at the providers discretion.     Again, thank you for choosing [facility].  Our hope is that these requests will decrease the amount of time that you wait before being seen by our physicians.       _____________________________________________________________  Should you have questions after your visit to [facility], please contact our office at [phone] between the hours of 8:30 a.m. and 4:30 p.m.  Voicemails left after 4:30 p.m. will not be returned until the following business day.  For prescription refill requests, have your pharmacy contact our office.       Resources For Cancer Patients and their Caregivers ? American Cancer Society: Can assist with transportation, wigs, general needs, runs Look Good Feel Better.        1-888-227-6333 ? Cancer Care: Provides financial assistance, online support groups, medication/co-pay assistance.  1-800-813-HOPE (4673) ? [facility] Assists [place] cancer patients and their families through emotional , educational and financial support.  336-427-4357 ? [place] DSS Where to apply for food stamps, Medicaid and utility assistance. 336-342-1394 ? RCATS: Transportation to medical appointments. 336-347-2287 ? Social Security Administration: May apply for disability if have a Stage IV cancer. 336-342-7796 1-800-772-1213 ? [place] Aging, Disability and Transit Services: Assists with nutrition, care and transit needs. 336-349-2343  Cancer Center Support Programs: @10RELATIVEDAYS@ > Cancer Support Group  2nd Tuesday of the month 1pm-2pm, Journey Room  > Creative Journey  3rd Tuesday of the month 1130am-1pm, Journey Room  > Look Good Feel Better  1st Wednesday of the month 10am-12 noon, Journey Room (Call American Cancer Society to register 1-800-395-5775)    

## 2016-07-21 NOTE — Progress Notes (Signed)
Stacie Huang presented for Portacath access and flush. Proper placement of portacath confirmed by CXR. Portacath located right chest wall accessed with  H 20 needle. Good blood return present. Portacath flushed with 63ml NS and 500U/70ml Heparin and needle removed intact. Procedure without incident. Patient tolerated procedure well.

## 2016-07-21 NOTE — Progress Notes (Signed)
Patient reports taking medication as prescribed and reports not missing any doses. She denies any side effects.

## 2016-07-21 NOTE — Progress Notes (Signed)
PROGRESS NOTE      Stacie Bellow, MD [address] [place] [address]  Invasive ductal carcinoma of breast, female, right [facility])  Malignant neoplasm of upper-inner quadrant of left breast in female, estrogen receptor positive ([place])   Breast cancer of upper-inner quadrant of left female breast ([place])   02/04/2013 Initial Diagnosis    Breast cancer of upper-inner quadrant of left female breast      02/23/2013 Surgery    left lumpectomy (UIQ), sentinel node biopsy--0.7cm, Node negative, ER+, PR+, HER-2/neu over expressed.      04/08/2013 - 04/14/2013 Chemotherapy    Paclitaxel/Herceptin weekly x 12. Intolerance to Paclitaxel and only receiving 2 cycles      05/04/2013 - 04/05/2014 Chemotherapy    Herceptin every 21 days       06/28/2013 -  Chemotherapy    Anastrazole started      08/02/2013 Imaging    Bone density- normal      04/06/2014 Imaging    MUGA- Left ventricular ejection fraction equals 53%.      08/06/2015 Imaging    Bone density- osteopenia      03/06/2016 Procedure    Stereotactic-guided biopsy of right breast upper outer quadrant focal asymmetry/calcifications. No apparent complications.      03/06/2016 Procedure    Successful placement of coil shaped marker within the right breast upper outer quadrant biopsy site, post stereotactic core needle biopsy.       Invasive ductal carcinoma of breast, female, right ([place])   02/26/2016 Mammogram    1. Focal asymmetry within the upper-outer quadrant of the right breast, with associated new calcifications. This is a suspicious finding for which stereotactic biopsy with 3D tomosynthesis guidance is recommended. 2. Cluster of cysts at the 10 o'clock axis, 5 cm from the nipple, measuring 1.2 x 0.6 x 1.1 cm, a possible correlate for the mammographic asymmetry.      03/06/2016 Procedure    Right needle core biopsy      03/07/2016 Pathology Results    Breast, right, needle core biopsy, upper  outer quadrant - HIGH GRADE DUCTAL CARCINOMA IN SITU WITH NECROSIS AND ASSOCIATED CALCIFICATIONS.IMMUNOHISTOCHEMICAL AND MORPHOMETRIC ANALYSIS PERFORMED MANUALLY Estrogen Receptor: 0%, NEGATIVE Progesterone Receptor: 0%, NEGATIVE      04/02/2016 Procedure    Breast, lumpectomy, right by Dr. Arnoldo Morale      04/07/2016 Pathology Results    INVASIVE DUCTAL CARCINOMA, GRADE 2, SPANNING 0.6 CM THE CARCINOMA IS FOCALLY PRESENTED AT THE CAUTERIZED MEDIAL MARGIN EXTENSIVE DUCTAL CARCINOMA IN SITU, GRADE 3 ALL OTHER SURGICAL MARGIN ARE NEGATIVE FOR CARCINOMA ER 0% PR 0% Ki-67 15% HER2 NEGATIVE      05/05/2016 Pathology Results    MammaPrint: LOW RISK (97.8% of low risk Mammaprint patients who were treated with anti-hormonal therapy alone are living without distant recurrence of breast cancer at 5-years).       CURRENT THERAPY: Anastrozole 1 mg daily beginning on 06/28/2013  INTERVAL HISTORY: CAMARI QUINTANILLA 76 y.o. 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 Now, S/P 12 months worth of Herceptin therapy every 3 weeks. Anastrozole started on 06/28/2013. Ms. Pickney is unaccompanied.  Unfortunately mammogram in October showed assymetry on the UO quadrant of the R breast with calcs. She has been diagnosed with high grade DCIS. Stereotactic biopsy was ER- PR-.   Patient underwent right lumpectomy on  04/02/16 with Dr. Aviva Signs. Surgical pathology showed invasive ductal carcinoma, grade 2, spanning 0.6 cm. The carcinoma was focally presented at the cauterized medial margin. Additionally noted was extensive ductal carcinoma in situ, grade 3. All other surgical margins were clear for malignancy.  The patient reports she is well today. She denies pain. She reports some hot flashes from Anastrozole, but they are manageable. She denies joint pain. Denies  abdominal pain. The patient completed adjuvant radiation 3 weeks ago.    Past Medical History:  Diagnosis Date  . Anxiety   . Breast cancer ([place])   . Cancer ([place])   . Chronic anticoagulation   . GERD (gastroesophageal reflux disease)   . Hyperlipidemia   . Hypertension   . Invasive ductal carcinoma of breast, female, right ([place]) 05/08/2016  . Osteopenia determined by x-ray 10/03/2015  . Paroxysmal atrial fibrillation ([facility])     has Hyperlipidemia; Chronic anticoagulation; Atrial fibrillation ([place]); Hypertension; Vitamin D deficiency; Breast cancer of upper-inner quadrant of left female breast ([place]); Paroxysmal atrial fibrillation ([place]); Encounter for therapeutic drug monitoring; GERD (gastroesophageal reflux disease); Bloating; Colon cancer screening; High risk medication use; Osteopenia determined by x-ray; and Invasive ductal carcinoma of breast, female, right ([place]) on her problem list.     is allergic to iohexol and sulfa antibiotics.   Past Surgical History:  Procedure Laterality Date  . ABDOMINAL HYSTERECTOMY    . AXILLARY LYMPH NODE DISSECTION Left 02/23/2013   Procedure: LEFT AXILLARY LYMPH NODE DISSECTION;  Surgeon: Scherry Ran, MD;  Location: [facility];  Service: General;  Laterality: Left;  . CHOLECYSTECTOMY    . COLONOSCOPY  03/10/2002   RMR: Incomplete colonoscopy (sigmoidoscopy)/Normal rectum/Normal colon to 40 cm.   . ESOPHAGOGASTRODUODENOSCOPY  03/10/2002   ZWC:HENIDP esophagus/small HH/Tiny AVM in antrum, otherwise normal stomach and D1 and D2/Status post passage of the [facility] dilator  . PARTIAL MASTECTOMY WITH AXILLARY SENTINEL LYMPH NODE BIOPSY Left 02/23/2013   Procedure: LEFT PARTIAL MASTECTOMY WITH AXILLARY SIMPLE NODE BIOPSY, LEFT BREAST WIDE EXCISION;  Surgeon: Scherry Ran, MD;  Location: [facility];  Service: General;  Laterality: Left;  . PARTIAL MASTECTOMY WITH NEEDLE LOCALIZATION Right 04/02/2016   Procedure: RIGHT PARTIAL MASTECTOMY AFTER NEEDLE  LOCALIZATION;  Surgeon: Aviva Signs, MD;  Location: [facility];  Service: General;  Laterality: Right;  . PORTACATH PLACEMENT Right 04/07/2013  . PORTACATH PLACEMENT Right 04/07/2013   Procedure: INSERTION PORT-A-CATH;  Surgeon: Scherry Ran, MD;  Location: [facility];  Service: General;  Laterality: Right;   Review of Systems  Constitutional: Negative.  Negative for malaise/fatigue.       Hot flashes from anastrozole.  HENT: Negative.   Eyes: Negative.   Respiratory: Negative.   Cardiovascular: Negative.   Gastrointestinal: Negative.  Negative for abdominal pain, blood in stool, constipation and diarrhea.  Genitourinary: Negative.   Musculoskeletal: Negative.  Negative for joint pain.  Skin: Negative.   Neurological: Negative.  Negative for weakness.  Endo/Heme/Allergies: Negative.   Psychiatric/Behavioral: Negative.   All other systems reviewed and are negative.  14 point review of systems was performed and is negative except as detailed under history of present illness and above    PHYSICAL EXAMINATION  ECOG PERFORMANCE STATUS: 0 - Asymptomatic  Vitals:   07/21/16 1118  BP: 113/71  Pulse: 95  Resp: 16  Temp: 97.9 F (36.6 C)      Physical Exam  Constitutional: She is oriented to person, place, and time and well-developed, well-nourished, and in no distress.  HENT:  Head: Normocephalic and atraumatic.  Eyes: EOM are normal. Pupils are equal, round, and reactive to light.  Neck: Normal range of motion. Neck supple.  Cardiovascular:  Regularly irregular heart rate  Pulmonary/Chest: Effort normal and breath sounds normal. No respiratory distress. She has no wheezes. She has no rales.    Breast exam deferred today.   Abdominal: Soft. Bowel sounds are normal. She exhibits no distension and no mass. There is no tenderness. There is no rebound and no guarding.  Musculoskeletal: Normal range of motion.  Neurological: She is alert and oriented to person, place, and time.  Gait normal.  Skin: Skin is warm and dry.  Psychiatric: Mood, memory, affect and judgment normal.  Nursing note and vitals reviewed.   LABORATORY DATA: I have reviewed the data as listed. CBC    Component Value Date/Time   WBC 7.2 05/27/2016 1027   RBC 4.10 05/27/2016 1027   HGB 12.4 05/27/2016 1027   HCT 38.0 05/27/2016 1027   PLT 297 05/27/2016 1027   MCV 92.7 05/27/2016 1027   MCH 30.2 05/27/2016 1027   MCHC 32.6 05/27/2016 1027   RDW 13.9 05/27/2016 1027   LYMPHSABS 2.2 05/27/2016 1027   MONOABS 0.8 05/27/2016 1027   EOSABS 0.1 05/27/2016 1027   BASOSABS 0.0 05/27/2016 1027      Chemistry      Component Value Date/Time   NA 135 05/27/2016 1027   K 4.1 05/27/2016 1027   CL 102 05/27/2016 1027   CO2 25 05/27/2016 1027   BUN 14 05/27/2016 1027   CREATININE 1.04 (H) 05/27/2016 1027      Component Value Date/Time   CALCIUM 8.9 05/27/2016 1027   ALKPHOS 37 (L) 05/27/2016 1027   AST 21 05/27/2016 1027   ALT 15 05/27/2016 1027   BILITOT 0.7 05/27/2016 1027     RADIOGRAPHIC STUDIES: I have personally reviewed the below radiographic studies and agree with their findings.  Study Result   CLINICAL DATA:  History of left breast cancer in 2014 status post breast conservation surgery.  EXAM: 2D DIGITAL DIAGNOSTIC BILATERAL MAMMOGRAM WITH CAD AND ADJUNCT TOMO  ULTRASOUND RIGHT BREAST  COMPARISON:  Previous exam(s).  ACR Breast Density Category b: There are scattered areas of fibroglandular density.  FINDINGS: There are stable postsurgical changes within the left breast. There are no new dominant masses, suspicious calcifications or secondary signs of malignancy within the left breast.  There is a focal asymmetry within the upper-outer quadrant of the right breast, at anterior to middle depth, best seen on the CC tomosynthesis slice 30 (also spot compression CC tomosynthesis slice 33), with associated new calcifications. On magnification views,  the calcifications are amorphous and slightly pleomorphic, loosely grouped, spanning approximately 1.4 cm.  Mammographic images were processed with CAD.  Targeted ultrasound is performed, evaluating the upper-outer quadrant of the right breast corresponding to the mammographic finding, showing a benign-appearing cluster of cysts at the 10 o'clock axis, 5 cm from the nipple, measuring 1.2 x 0.6 x 1.1 cm, avascular, a possible correlate for the mammographic finding. No suspicious solid or cystic masses are identified within the upper-outer quadrant.  Right axilla was evaluated with ultrasound showing no enlarged or morphologically abnormal lymph nodes.  IMPRESSION: 1. Focal asymmetry within the upper-outer quadrant of the right breast, with associated new calcifications. This is a suspicious finding for which stereotactic biopsy with 3D tomosynthesis guidance is recommended. 2. Cluster of cysts at the 10 o'clock axis, 5 cm from the nipple, measuring 1.2 x  0.6 x 1.1 cm, a possible correlate for the mammographic asymmetry. Findings may indicate fibrocystic change with calcifications.  RECOMMENDATION: Stereotactic biopsy, with 3D tomosynthesis guidance, for the focal asymmetry in the upper-outer quadrant of the right breast with associated calcifications. Recommend stereotactic biopsy localization at the lateral margin of the asymmetry to include both the asymmetry and calcifications within the specimen.  Ordering physician will be contacted with today's results and patient will then be scheduled for stereotactic biopsy at the [place] at her earliest convenience.  I have discussed the findings and recommendations with the patient. Results were also provided in writing at the conclusion of the visit. If applicable, a reminder letter will be sent to the patient regarding the next appointment.  BI-RADS CATEGORY  4:  Suspicious.   Electronically Signed   By: Franki Cabot M.D.   On: 02/26/2016 13:07     PATHOLOGY:       ASSESSMENT AND PLAN:  Stage IA invasive breast cancer L breast, ER positive, PR positive, HER-2/neu over amplified; Arimidex started 06/2013 Hot flashes secondary to above Normal Bone Density on DEXA 08/02/2013 Osteopenia on DEXA 07/2015 New diagnosis high grade DCIS, high grade ER- PR- R breast  Patient will continue to take Anastrozole. Received her last dose of prolia in January 2018. Next dose in June 2018. Continue calcium-vitamin D. Next bilateral diagnostic mammogram in October 2018. RTC in 3 months for follow up.   All questions were answered. The patient knows to call the clinic with any problems, questions or concerns. We can certainly see the patient much sooner if necessary.   This document serves as a record of services personally performed by Twana First, MD. It was created on her behalf by Maryla Morrow, a trained medical scribe. The creation of this record is based on the scribe's personal observations and the provider's statements to them. This document has been checked and approved by the attending provider.  I have reviewed the above documentation for accuracy and completeness and I agree with the above.  This note is electronically signed.  Twana First, MD

## 2016-08-11 ENCOUNTER — Ambulatory Visit (INDEPENDENT_AMBULATORY_CARE_PROVIDER_SITE_OTHER): Payer: Medicare Other | Admitting: *Deleted

## 2016-08-11 DIAGNOSIS — I4891 Unspecified atrial fibrillation: Secondary | ICD-10-CM | POA: Diagnosis not present

## 2016-08-11 DIAGNOSIS — Z5181 Encounter for therapeutic drug level monitoring: Secondary | ICD-10-CM | POA: Diagnosis not present

## 2016-08-11 LAB — POCT INR: INR: 2.3

## 2016-09-16 ENCOUNTER — Encounter (HOSPITAL_COMMUNITY): Payer: Self-pay

## 2016-09-16 ENCOUNTER — Encounter (HOSPITAL_COMMUNITY): Payer: Medicare Other | Attending: Hematology and Oncology

## 2016-09-16 ENCOUNTER — Other Ambulatory Visit (HOSPITAL_COMMUNITY): Payer: Self-pay | Admitting: Oncology

## 2016-09-16 VITALS — BP 114/58 | HR 92 | Temp 98.2°F | Resp 18

## 2016-09-16 DIAGNOSIS — Z95828 Presence of other vascular implants and grafts: Secondary | ICD-10-CM

## 2016-09-16 DIAGNOSIS — C50212 Malignant neoplasm of upper-inner quadrant of left female breast: Secondary | ICD-10-CM | POA: Insufficient documentation

## 2016-09-16 DIAGNOSIS — D0511 Intraductal carcinoma in situ of right breast: Secondary | ICD-10-CM

## 2016-09-16 DIAGNOSIS — Z452 Encounter for adjustment and management of vascular access device: Secondary | ICD-10-CM | POA: Diagnosis present

## 2016-09-16 MED ORDER — SODIUM CHLORIDE 0.9% FLUSH
10.0000 mL | INTRAVENOUS | Status: DC | PRN
  Administered 2016-09-16: 10 mL via INTRAVENOUS
  Filled 2016-09-16: qty 10

## 2016-09-16 MED ORDER — HEPARIN SOD (PORK) LOCK FLUSH 100 UNIT/ML IV SOLN
500.0000 [IU] | Freq: Once | INTRAVENOUS | Status: AC
  Administered 2016-09-16: 500 [IU] via INTRAVENOUS

## 2016-09-16 NOTE — Patient Instructions (Signed)
[facility] at [facility] Discharge Instructions  RECOMMENDATIONS MADE BY THE CONSULTANT AND ANY TEST RESULTS WILL BE SENT TO YOUR REFERRING PHYSICIAN.  Portacath flushed per protocol today. Follow-up as scheduled. Call clinic for any questions or concerns  Thank you for choosing [facility] at [facility] to provide your oncology and hematology care.  To afford each patient quality time with our provider, please arrive at least 15 minutes before your scheduled appointment time.    If you have a lab appointment with the Cancer Center please come in thru the  Main Entrance and check in at the main information desk  You need to re-schedule your appointment should you arrive 10 or more minutes late.  We strive to give you quality time with our providers, and arriving late affects you and other patients whose appointments are after yours.  Also, if you no show three or more times for appointments you may be dismissed from the clinic at the providers discretion.     Again, thank you for choosing [facility].  Our hope is that these requests will decrease the amount of time that you wait before being seen by our physicians.       _____________________________________________________________  Should you have questions after your visit to [facility], please contact our office at [phone] between the hours of 8:30 a.m. and 4:30 p.m.  Voicemails left after 4:30 p.m. will not be returned until the following business day.  For prescription refill requests, have your pharmacy contact our office.       Resources For Cancer Patients and their Caregivers ? American Cancer Society: Can assist with transportation, wigs, general needs, runs Look Good Feel Better.        1-888-227-6333 ? Cancer Care: Provides financial assistance, online support groups, medication/co-pay assistance.  1-800-813-HOPE (4673) ? [facility] Assists [place] cancer patients and their families through emotional , educational and financial support.  336-427-4357 ? [place] DSS Where to apply for food stamps, Medicaid and utility assistance. 336-342-1394 ? RCATS: Transportation to medical appointments. 336-347-2287 ? Social Security Administration: May apply for disability if have a Stage IV cancer. 336-342-7796 1-800-772-1213 ? [place] Aging, Disability and Transit Services: Assists with nutrition, care and transit needs. 336-349-2343  Cancer Center Support Programs: @10RELATIVEDAYS@ > Cancer Support Group  2nd Tuesday of the month 1pm-2pm, Journey Room  > Creative Journey  3rd Tuesday of the month 1130am-1pm, Journey Room  > Look Good Feel Better  1st Wednesday of the month 10am-12 noon, Journey Room (Call [employer] to register 1-800-395-5775)   

## 2016-09-16 NOTE — Progress Notes (Signed)
Stacie Huang tolerated port flush well without complaints or incident. Port accessed with 20 gauge needle with good blood return noted then flushed with 10 ml NS and 5 ml Heparin easily per protocol. VSS Pt discharged self ambulatory in satisfactory condition

## 2016-09-17 ENCOUNTER — Ambulatory Visit (INDEPENDENT_AMBULATORY_CARE_PROVIDER_SITE_OTHER): Payer: Medicare Other | Admitting: *Deleted

## 2016-09-17 DIAGNOSIS — I4891 Unspecified atrial fibrillation: Secondary | ICD-10-CM

## 2016-09-17 DIAGNOSIS — Z5181 Encounter for therapeutic drug level monitoring: Secondary | ICD-10-CM

## 2016-09-17 LAB — POCT INR: INR: 2.2

## 2016-10-03 ENCOUNTER — Encounter: Payer: Self-pay | Admitting: Internal Medicine

## 2016-10-21 ENCOUNTER — Encounter (HOSPITAL_COMMUNITY): Payer: Medicare Other | Attending: Hematology and Oncology | Admitting: Adult Health

## 2016-10-21 ENCOUNTER — Encounter (HOSPITAL_COMMUNITY): Payer: Self-pay | Admitting: Adult Health

## 2016-10-21 VITALS — BP 102/85 | HR 100 | Resp 16 | Ht 61.0 in | Wt 144.0 lb

## 2016-10-21 DIAGNOSIS — N951 Menopausal and female climacteric states: Secondary | ICD-10-CM

## 2016-10-21 DIAGNOSIS — M79622 Pain in left upper arm: Secondary | ICD-10-CM

## 2016-10-21 DIAGNOSIS — M8580 Other specified disorders of bone density and structure, unspecified site: Secondary | ICD-10-CM

## 2016-10-21 DIAGNOSIS — Z170 Estrogen receptor positive status [ER+]: Secondary | ICD-10-CM | POA: Diagnosis not present

## 2016-10-21 DIAGNOSIS — C50911 Malignant neoplasm of unspecified site of right female breast: Secondary | ICD-10-CM

## 2016-10-21 DIAGNOSIS — C50212 Malignant neoplasm of upper-inner quadrant of left female breast: Secondary | ICD-10-CM | POA: Insufficient documentation

## 2016-10-21 DIAGNOSIS — C50411 Malignant neoplasm of upper-outer quadrant of right female breast: Secondary | ICD-10-CM

## 2016-10-21 NOTE — Progress Notes (Signed)
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      04/08/2013 - 04/14/2013 Chemotherapy    Paclitaxel/Herceptin weekly x 12. Intolerance to Paclitaxel and only receiving 2 cycles      05/04/2013 - 04/05/2014 Chemotherapy    Herceptin every 21 days       06/28/2013 -  Chemotherapy    Anastrazole started      08/02/2013 Imaging    Bone density- normal      04/06/2014 Imaging    MUGA- Left ventricular ejection fraction equals 53%.      08/06/2015 Imaging    Bone density- osteopenia      03/06/2016 Procedure    Stereotactic-guided biopsy of right breast upper outer quadrant focal asymmetry/calcifications. No apparent complications.      03/06/2016 Procedure    Successful placement of coil shaped marker within the right breast upper outer quadrant biopsy site, post stereotactic core needle biopsy.       Invasive ductal carcinoma of breast, female, right ([place])   02/26/2016 Mammogram    1. Focal asymmetry within the upper-outer quadrant of the right breast, with associated new calcifications. This is a suspicious finding for which stereotactic biopsy with 3D tomosynthesis guidance is recommended. 2. Cluster of cysts at the 10 o'clock axis, 5 cm from the  nipple, measuring 1.2 x 0.6 x 1.1 cm, a possible correlate for the mammographic asymmetry.      03/06/2016 Procedure    Right needle core biopsy      03/07/2016 Pathology Results    Breast, right, needle core biopsy, upper outer quadrant - HIGH GRADE DUCTAL CARCINOMA IN SITU WITH NECROSIS AND ASSOCIATED CALCIFICATIONS.IMMUNOHISTOCHEMICAL AND MORPHOMETRIC ANALYSIS PERFORMED MANUALLY Estrogen Receptor: 0%, NEGATIVE Progesterone Receptor: 0%, NEGATIVE      04/02/2016 Procedure    Breast, lumpectomy, right by Dr. Arnoldo Morale      04/07/2016 Pathology Results    INVASIVE DUCTAL CARCINOMA, GRADE 2, SPANNING 0.6 CM THE CARCINOMA IS FOCALLY PRESENTED AT THE CAUTERIZED MEDIAL MARGIN EXTENSIVE DUCTAL CARCINOMA IN SITU, GRADE 3 ALL OTHER SURGICAL MARGIN ARE NEGATIVE FOR CARCINOMA ER 0% PR 0% Ki-67 15% HER2 NEGATIVE      05/05/2016 Pathology Results    MammaPrint: LOW RISK (97.8% of low risk Mammaprint patients who were treated with anti-hormonal therapy alone are living without distant recurrence of breast cancer at 5-years).      06/02/2016 - 07/04/2016 Radiation Therapy    XRT in [place], [place].  Right breast 42.56 Gy delivered in 16 fractions at 2.65 Gy per fraction and a right tumor bed boost 16 Gy delivered in 8 fractions at 2 Gy per fraction for a total dose of 58.56 Gy completed on 07/04/2016.        INTERVAL HISTORY:  [name] 76 y.o. female returns for follow-up for history  of bilateral breast cancers.   Overall, she tells me she has been feeling quite well. He reports that her appetite is 100% and energy levels are 75%. She continues to tolerate the anastrozole pretty well, largely with intermittent hot flashes as her most common complaint. Hot flashes are manageable for her without additional intervention. She denies any vaginal dryness or worsening arthralgias. She remains on calcium/vitamin D supplementation.  Her biggest complaint today is left axilla "stinging and burning pain."  Reports that she has noticed this pain "off and on for the past couple of weeks." States that she has never had this sensation before. She thinks this may be due to her history of lumpectomy to the left breast in 2014.  She lives alone. Her daughter and granddaughter live in [place], [place]. She enjoys going to the senior center for dinner 4 days per week, as this time gives her socialization with friends.  She is considering if she wants to have her Port-A-Cath removed; she wants to think about this more and we can discuss further at her upcoming appointments.    Otherwise, she is largely without complaints today.   REVIEW OF SYSTEMS:  Review of Systems  Constitutional: Negative.  Negative for chills, fatigue and fever.  HENT:  Negative.  Negative for lump/mass and nosebleeds.   Eyes: Negative.   Respiratory: Negative.  Negative for cough and shortness of breath.   Cardiovascular: Negative.  Negative for chest pain and leg swelling.  Gastrointestinal: Negative.  Negative for abdominal pain, blood in stool, constipation, diarrhea, nausea and vomiting.  Endocrine: Positive for hot flashes.  Genitourinary: Negative.  Negative for dysuria and hematuria.   Musculoskeletal: Negative.  Negative for arthralgias.  Skin: Negative.  Negative for rash.  Neurological: Negative.  Negative for dizziness and headaches.  Hematological: Negative.  Negative for adenopathy. Does not bruise/bleed easily.  Psychiatric/Behavioral: Negative.  Negative for depression and sleep disturbance. The patient is not nervous/anxious.      PAST MEDICAL/SURGICAL HISTORY:  Past Medical History:  Diagnosis Date  . Anxiety   . Breast cancer ([place])   . Cancer ([place])   . Chronic anticoagulation   . GERD (gastroesophageal reflux disease)   . Hyperlipidemia   . Hypertension   . Invasive ductal carcinoma of breast, female, right ([place]) 05/08/2016  . Osteopenia determined by x-ray 10/03/2015  . Paroxysmal atrial fibrillation [facility])     Past Surgical History:  Procedure Laterality Date  . ABDOMINAL HYSTERECTOMY    . AXILLARY LYMPH NODE DISSECTION Left 02/23/2013   Procedure: LEFT AXILLARY LYMPH NODE DISSECTION;  Surgeon: Scherry Ran, MD;  Location: [facility];  Service: General;  Laterality: Left;  . CHOLECYSTECTOMY    . COLONOSCOPY  03/10/2002   RMR: Incomplete colonoscopy (sigmoidoscopy)/Normal rectum/Normal colon to 40 cm.   . ESOPHAGOGASTRODUODENOSCOPY  03/10/2002   WVP:XTGGYI esophagus/small HH/Tiny AVM in antrum, otherwise normal stomach and D1 and D2/Status post passage of the [facility] dilator  . PARTIAL MASTECTOMY WITH AXILLARY SENTINEL LYMPH NODE BIOPSY Left 02/23/2013   Procedure: LEFT PARTIAL MASTECTOMY WITH AXILLARY SIMPLE NODE BIOPSY, LEFT BREAST WIDE EXCISION;  Surgeon: Scherry Ran, MD;  Location: [facility];  Service: General;  Laterality: Left;  . PARTIAL MASTECTOMY WITH NEEDLE LOCALIZATION Right 04/02/2016   Procedure: RIGHT PARTIAL MASTECTOMY AFTER NEEDLE LOCALIZATION;  Surgeon: Aviva Signs, MD;  Location: [facility] OR[facility];  Service: General;  Laterality: Right;  . PORTACATH PLACEMENT Right 04/07/2013  . PORTACATH PLACEMENT Right 04/07/2013   Procedure: INSERTION PORT-A-CATH;  Surgeon: Gwyndolyn Saxon  Hulan Amato, MD;  Location: [facility] OR[facility];  Service: General;  Laterality: Right;     SOCIAL HISTORY:  Social History   Social History  . Marital status: Legally Separated    Spouse name: N/A  . Number of children:  2  . Years of education: N/A   Occupational History  . Retired from [employer]  . Smoking status: Never Smoker  . Smokeless tobacco: Never Used  . Alcohol use No  . Drug use: No  . Sexual activity: Yes    Birth control/ protection: Surgical   Other Topics Concern  . Not on file   Social History Narrative   2 adult children    FAMILY HISTORY:  Family History  Problem Relation Age of Onset  . Heart disease Mother        Also 37 of 9 siblings  .  Heart attack Father   . Leukemia Unknown   . Leukemia Unknown   . Colon cancer Neg Hx     CURRENT MEDICATIONS:  Outpatient Encounter Prescriptions as of 10/21/2016  Medication Sig  . acetaminophen (TYLENOL) 500 MG tablet Take 1,000 mg by mouth daily.  Marland Kitchen anastrozole (ARIMIDEX) 1 MG tablet TAKE ONE TABLET BY MOUTH DAILY.  Marland Kitchen atenolol (TENORMIN) 100 MG tablet Take 50 mg by mouth daily before breakfast.   . calcium carbonate (OS-CAL) 600 MG TABS tablet Take 600 mg by mouth 2 (two) times daily with a meal.   . cetirizine (ZYRTEC) 10 MG tablet Take 10 mg by mouth every morning.   . Cholecalciferol (VITAMIN D) 400 UNITS capsule Take 400 Units by mouth 3 (three) times daily.   Marland Kitchen denosumab (PROLIA) 60 MG/ML SOLN injection Inject 60 mg into the skin every 6 (six) months. Administer in upper arm, thigh, or abdomen  . diltiazem (CARDIZEM CD) 120 MG 24 hr capsule TAKE 1 CAPSULE BY MOUTH EVERY DAY.  . fluticasone (FLONASE) 50 MCG/ACT nasal spray Place 2 sprays into the nose at bedtime.   Marland Kitchen LORazepam (ATIVAN) 1 MG tablet Take 1 mg by mouth daily as needed for anxiety (AND/OR FOR SLEEP).   Marland Kitchen losartan (COZAAR) 50 MG tablet Take 50 mg by mouth every morning.   . metoCLOPramide (REGLAN) 10 MG tablet TAKE 1 TABLET BY MOUTH EVERY 8 HOURS AS NEEDED FOR NAUSEA.  . pantoprazole (PROTONIX) 40 MG tablet TAKE (1) TABLET BY MOUTH TWICE DAILY.  Marland Kitchen psyllium (METAMUCIL) 58.6 % powder Take 1 packet by mouth daily.   . simvastatin (ZOCOR) 10 MG tablet Take 10 mg by mouth at bedtime.    Marland Kitchen warfarin (COUMADIN) 2.5 MG tablet TAKE ONE TABLET BY MOUTH ON TUESDAYS AND FRIDAYS; TAKE 1/2 TABLET ON ALL OTHER DAYS. (Patient taking differently: TAKE ONE TABLET BY MOUTH ON FRIDAYS; TAKE 1/2 TABLET ON ALL OTHER DAYS.)   No facility-administered encounter medications on file as of 10/21/2016.     ALLERGIES:  Allergies  Allergen Reactions  . Iohexol Hives  . Sulfa Antibiotics Rash     PHYSICAL EXAM:  ECOG Performance status: 0-1 -  Mild symptoms; remains independent.   Vitals:   10/21/16 1334  BP: 102/85  Pulse: 100  Resp: 16   Filed Weights   10/21/16 1334  Weight: 144 lb (65.3 kg)    Physical Exam  Constitutional: She is oriented to person, place, and time and well-developed, well-nourished, and in no distress.  HENT:  Head: Normocephalic.  Mouth/Throat: Oropharynx is clear and moist. No  oropharyngeal exudate.  Eyes: Conjunctivae are normal. Pupils are equal, round, and reactive to light. No scleral icterus.  Neck: Normal range of motion. Neck supple.  Cardiovascular:  Irregular rhythm consistent with h/o A-fib   Pulmonary/Chest: Effort normal and breath sounds normal. No respiratory distress. She has no wheezes. She has no rales.    Abdominal: Soft. Bowel sounds are normal. There is no tenderness. There is no rebound and no guarding.  Musculoskeletal: Normal range of motion. She exhibits no edema.  Lymphadenopathy:    She has no cervical adenopathy.       Right: No supraclavicular adenopathy present.       Left: No supraclavicular adenopathy present.  Neurological: She is alert and oriented to person, place, and time. No cranial nerve deficit. Gait normal.  Skin: Skin is warm and dry. No rash noted.  Psychiatric: Mood, memory, affect and judgment normal.  Nursing note and vitals reviewed.    LABORATORY DATA:  I have reviewed the labs as listed.  CBC    Component Value Date/Time   WBC 7.2 05/27/2016 1027   RBC 4.10 05/27/2016 1027   HGB 12.4 05/27/2016 1027   HCT 38.0 05/27/2016 1027   PLT 297 05/27/2016 1027   MCV 92.7 05/27/2016 1027   MCH 30.2 05/27/2016 1027   MCHC 32.6 05/27/2016 1027   RDW 13.9 05/27/2016 1027   LYMPHSABS 2.2 05/27/2016 1027   MONOABS 0.8 05/27/2016 1027   EOSABS 0.1 05/27/2016 1027   BASOSABS 0.0 05/27/2016 1027   CMP Latest Ref Rng & Units 05/27/2016 03/31/2016 03/12/2016  Glucose 65 - 99 mg/dL 121(H) 115(H) 108(H)  BUN 6 - 20 mg/dL '14 17 15  ' Creatinine 0.44 -  1.00 mg/dL 1.04(H) 0.94 1.02(H)  Sodium 135 - 145 mmol/L 135 137 134(L)  Potassium 3.5 - 5.1 mmol/L 4.1 3.9 3.6  Chloride 101 - 111 mmol/L 102 105 104  CO2 22 - 32 mmol/L '25 24 25  ' Calcium 8.9 - 10.3 mg/dL 8.9 9.1 8.8(L)  Total Protein 6.5 - 8.1 g/dL 6.6 6.7 6.6  Total Bilirubin 0.3 - 1.2 mg/dL 0.7 0.6 0.4  Alkaline Phos 38 - 126 U/L 37(L) 41 37(L)  AST 15 - 41 U/L '21 20 19  ' ALT 14 - 54 U/L 15 14 13(L)    PENDING LABS:    DIAGNOSTIC IMAGING:  *The following radiologic images and reports have been reviewed independently and agree with below findings.  DEXA scan: 08/06/15      PATHOLOGY:  (R) breast lumpectomy path: 04/02/16       (L) breast lumpectomy path: 02/23/13          ASSESSMENT & PLAN:  *Stage IA LEFT breast invasive ductal carcinoma, ER+/PR+/HER2+:  -Diagnosed in 01/2013. Treated with (L) lumpectomy, followed by Taxol/Herceptin x 12 cycles; Taxol was discontinued after only 2 cycles d/t intolerable side effects.  She did not receive adjuvant radiation therapy, but started anti-estrogen therapy with Anastrozole in 06/2013.  *Stage IA RIGHT breast invasive ductal carcinoma, ER-/PR-/HER2-:  -Diagnosed in 02/2016. Treated with (R) lumpectomy. Mammoprint score indicated LOW risk, so no indication for adjuvant chemotherapy. She went on to complete adjuvant breast radiation, which completed on 07/04/16.  Bilateral breast cancer:   -Clinical breast exam performed today. There is possible development of small seroma superior to (R) lumpectomy scar on physical exam. No definitive mass or worrisome nodularity. Encouraged her to monitor this area and call us with any changes. We will examine again at next follow-up.  -Area of concern  to patient's (L) axilla is likely nerve related pain s/p lumpectomy; no palpable mass or nodularity appreciated.   -Continue Anastrozole with plans to continue at least 5 years (through 06/2018). Can discuss at future visits sending Breast Cancer  Index (BCI) testing to assess need for continuation of endocrine therapy beyond 5 years.  -Annual diagnostic mammogram due in 01/2017; orders placed today.  -Return to cancer center in 4 months after mammogram. If she continues to do well at that time, then we can consider seeing her at every 6 month intervals going forward at that time.    Bone health:  -Last DEXA scan on 08/06/15 revealed osteopenia with T-score -1.5; will be due for biennial DEXA imaging in 07/2017.  -She understands that Anastrozole can further weaken bone density. I recommended she continue calcium/vitamin D supplementation, as well as increase her weight-bearing exercises as tolerated. -Continue Prolia injections every 6 months. Due in 10/2016 as scheduled.  Oncology Flowsheet 05/27/2016  denosumab (PROLIA) [place] 60 mg      Dispo:  -Mammogram due in 01/2017; orders placed today.  -Return to cancer center in 4 months after mammogram.  -Continue port flush every 2 months; can consider having port-a-cath removed at subsequent follow-up visits if patient wishes.    All questions were answered to patient's stated satisfaction. Encouraged patient to call with any new concerns or questions before her next visit to the cancer center and we can certain see her sooner, if needed.    Plan of care discussed with Dr. Talbert Cage, who agrees with the above aforementioned.    Orders placed this encounter:  Orders Placed This Encounter  Procedures  . MM DIAG BREAST TOMO BILATERAL      Mike Craze, NP [place] [phone]

## 2016-10-21 NOTE — Patient Instructions (Addendum)
[place] at [facility] Discharge Instructions  RECOMMENDATIONS MADE BY THE CONSULTANT AND ANY TEST RESULTS WILL BE SENT TO YOUR REFERRING PHYSICIAN.  You were seen today by Mike Craze NP. Mammogram due in October. Return every 2 months for port flushes. Return in 4 months for follow up after your mammogram.    Thank you for choosing [place] at [facility] to provide your oncology and hematology care.  To afford each patient quality time with our provider, please arrive at least 15 minutes before your scheduled appointment time.    If you have a lab appointment with the [place] please come in thru the  Main Entrance and check in at the main information desk  You need to re-schedule your appointment should you arrive 10 or more minutes late.  We strive to give you quality time with our providers, and arriving late affects you and other patients whose appointments are after yours.  Also, if you no show three or more times for appointments you may be dismissed from the clinic at the providers discretion.     Again, thank you for choosing [facility].  Our hope is that these requests will decrease the amount of time that you wait before being seen by our physicians.       _____________________________________________________________  Should you have questions after your visit to [facility], please contact our office at [phone] between the hours of 8:30 a.m. and 4:30 p.m.  Voicemails left after 4:30 p.m. will not be returned until the following business day.  For prescription refill requests, have your pharmacy contact our office.       Resources For Cancer Patients and their Caregivers ? American Cancer Society: Can assist with transportation, wigs, general needs, runs Look Good Feel Better.        717-563-3429 ? Cancer Care: Provides financial assistance, online support groups,  medication/co-pay assistance.  1-800-813-HOPE 716-147-5421) ? [place] Assists [place] cancer patients and their families through emotional , educational and financial support.  (902)697-3929 ? [place] DSS Where to apply for food stamps, Medicaid and utility assistance. 959 566 3575 ? RCATS: Transportation to medical appointments. 212 382 3538 ? Social Security Administration: May apply for disability if have a Stage IV cancer. 220-544-3423 845-801-7545 ? [employer], Disability and Transit Services: Assists with nutrition, care and transit needs. [place] Support Programs: @10RELATIVEDAYS @ > Cancer Support Group  2nd Tuesday of the month 1pm-2pm, Journey Room  > Creative Journey  3rd Tuesday of the month 1130am-1pm, Journey Room  > Look Good Feel Better  1st Wednesday of the month 10am-12 noon, Journey Room (Call [place] to register [phone])

## 2016-11-03 ENCOUNTER — Ambulatory Visit (INDEPENDENT_AMBULATORY_CARE_PROVIDER_SITE_OTHER): Payer: Medicare Other | Admitting: *Deleted

## 2016-11-03 ENCOUNTER — Other Ambulatory Visit: Payer: Self-pay | Admitting: Adult Health

## 2016-11-03 DIAGNOSIS — Z5181 Encounter for therapeutic drug level monitoring: Secondary | ICD-10-CM | POA: Diagnosis not present

## 2016-11-03 DIAGNOSIS — I4891 Unspecified atrial fibrillation: Secondary | ICD-10-CM

## 2016-11-03 LAB — POCT INR: INR: 2.3

## 2016-11-14 ENCOUNTER — Other Ambulatory Visit (HOSPITAL_COMMUNITY): Payer: Self-pay | Admitting: Oncology

## 2016-11-24 ENCOUNTER — Other Ambulatory Visit (HOSPITAL_COMMUNITY): Payer: Self-pay | Admitting: Oncology

## 2016-11-24 ENCOUNTER — Encounter (HOSPITAL_COMMUNITY): Payer: Self-pay

## 2016-11-24 ENCOUNTER — Encounter (HOSPITAL_COMMUNITY): Payer: Medicare Other | Attending: Oncology

## 2016-11-24 ENCOUNTER — Encounter (HOSPITAL_COMMUNITY): Payer: Medicare Other

## 2016-11-24 DIAGNOSIS — Z170 Estrogen receptor positive status [ER+]: Secondary | ICD-10-CM | POA: Diagnosis present

## 2016-11-24 DIAGNOSIS — C50911 Malignant neoplasm of unspecified site of right female breast: Secondary | ICD-10-CM | POA: Diagnosis not present

## 2016-11-24 DIAGNOSIS — M8580 Other specified disorders of bone density and structure, unspecified site: Secondary | ICD-10-CM

## 2016-11-24 DIAGNOSIS — C50212 Malignant neoplasm of upper-inner quadrant of left female breast: Secondary | ICD-10-CM | POA: Diagnosis present

## 2016-11-24 DIAGNOSIS — Z79899 Other long term (current) drug therapy: Secondary | ICD-10-CM

## 2016-11-24 LAB — CBC WITH DIFFERENTIAL/PLATELET
Basophils Absolute: 0 10*3/uL (ref 0.0–0.1)
Basophils Relative: 1 %
EOS ABS: 0.1 10*3/uL (ref 0.0–0.7)
Eosinophils Relative: 1 %
HCT: 39.6 % (ref 36.0–46.0)
HEMOGLOBIN: 12.7 g/dL (ref 12.0–15.0)
Lymphocytes Relative: 28 %
Lymphs Abs: 1.7 10*3/uL (ref 0.7–4.0)
MCH: 29.8 pg (ref 26.0–34.0)
MCHC: 32.1 g/dL (ref 30.0–36.0)
MCV: 93 fL (ref 78.0–100.0)
MONO ABS: 0.9 10*3/uL (ref 0.1–1.0)
MONOS PCT: 14 %
NEUTROS PCT: 56 %
Neutro Abs: 3.5 10*3/uL (ref 1.7–7.7)
Platelets: 251 10*3/uL (ref 150–400)
RBC: 4.26 MIL/uL (ref 3.87–5.11)
RDW: 14 % (ref 11.5–15.5)
WBC: 6.2 10*3/uL (ref 4.0–10.5)

## 2016-11-24 LAB — COMPREHENSIVE METABOLIC PANEL
ALK PHOS: 45 U/L (ref 38–126)
ALT: 17 U/L (ref 14–54)
ANION GAP: 10 (ref 5–15)
AST: 25 U/L (ref 15–41)
Albumin: 3.9 g/dL (ref 3.5–5.0)
BILIRUBIN TOTAL: 0.9 mg/dL (ref 0.3–1.2)
BUN: 14 mg/dL (ref 6–20)
CALCIUM: 9.3 mg/dL (ref 8.9–10.3)
CO2: 24 mmol/L (ref 22–32)
CREATININE: 1.03 mg/dL — AB (ref 0.44–1.00)
Chloride: 104 mmol/L (ref 101–111)
GFR calc Af Amer: 59 mL/min — ABNORMAL LOW (ref >60)
GFR, EST NON AFRICAN AMERICAN: 51 mL/min — AB (ref >60)
GLUCOSE: 128 mg/dL — AB (ref 65–99)
Potassium: 4.4 mmol/L (ref 3.5–5.1)
Sodium: 138 mmol/L (ref 135–145)
TOTAL PROTEIN: 6.9 g/dL (ref 6.5–8.1)

## 2016-11-24 MED ORDER — SODIUM CHLORIDE 0.9% FLUSH
10.0000 mL | INTRAVENOUS | Status: DC | PRN
  Administered 2016-11-24: 10 mL via INTRAVENOUS
  Filled 2016-11-24: qty 10

## 2016-11-24 MED ORDER — HEPARIN SOD (PORK) LOCK FLUSH 100 UNIT/ML IV SOLN
INTRAVENOUS | Status: AC
  Filled 2016-11-24: qty 5

## 2016-11-24 MED ORDER — DENOSUMAB 60 MG/ML ~~LOC~~ SOLN
60.0000 mg | Freq: Once | SUBCUTANEOUS | Status: AC
  Administered 2016-11-24: 60 mg via SUBCUTANEOUS
  Filled 2016-11-24: qty 1

## 2016-11-24 MED ORDER — HEPARIN SOD (PORK) LOCK FLUSH 100 UNIT/ML IV SOLN
500.0000 [IU] | Freq: Once | INTRAVENOUS | Status: AC
  Administered 2016-11-24: 500 [IU] via INTRAVENOUS

## 2016-11-24 NOTE — Progress Notes (Signed)
Wendelin A Pedone tolerated Prolia injection and port flush well without complaints or incident. Labs reviewed prior to administering this medication. Pt denied any jaw,tooth or leg pain or dental issues prior to administration as well. Calcium 9.3. Pt continues to take her Calcium PO as prescribed.Port accessed with 20 gauge needle with blood return noted then flushed with 10 ml NS and 5 ml Heparin easily per protocol VSS .Pt discharged self ambulatory in satisfactory condition.

## 2016-11-24 NOTE — Patient Instructions (Signed)
[place] at [facility] Discharge Instructions  RECOMMENDATIONS MADE BY THE CONSULTANT AND ANY TEST RESULTS WILL BE SENT TO YOUR REFERRING PHYSICIAN.  Received Prolia injection and port flushed per protocol today. Follow-up as scheduled. Call clinic for any questions or concerns  Thank you for choosing [place] at [facility] to provide your oncology and hematology care.  To afford each patient quality time with our provider, please arrive at least 15 minutes before your scheduled appointment time.    If you have a lab appointment with the [place] please come in thru the  Main Entrance and check in at the main information desk  You need to re-schedule your appointment should you arrive 10 or more minutes late.  We strive to give you quality time with our providers, and arriving late affects you and other patients whose appointments are after yours.  Also, if you no show three or more times for appointments you may be dismissed from the clinic at the providers discretion.     Again, thank you for choosing [facility].  Our hope is that these requests will decrease the amount of time that you wait before being seen by our physicians.       _____________________________________________________________  Should you have questions after your visit to [facility], please contact our office at [phone] between the hours of 8:30 a.m. and 4:30 p.m.  Voicemails left after 4:30 p.m. will not be returned until the following business day.  For prescription refill requests, have your pharmacy contact our office.       Resources For Cancer Patients and their Caregivers ? American Cancer Society: Can assist with transportation, wigs, general needs, runs Look Good Feel Better.        901-223-2929 ? Cancer Care: Provides financial assistance, online support groups, medication/co-pay assistance.  1-800-813-HOPE  857-229-9149) ? [place] Assists [place] cancer patients and their families through emotional , educational and financial support.  (814)304-8397 ? [place] DSS Where to apply for food stamps, Medicaid and utility assistance. 409-194-5768 ? RCATS: Transportation to medical appointments. 506-878-7513 ? Social Security Administration: May apply for disability if have a Stage IV cancer. 415-516-9035 239-255-1803 ? [employer], Disability and Transit Services: Assists with nutrition, care and transit needs. [place] Support Programs: @10RELATIVEDAYS @ > Cancer Support Group  2nd Tuesday of the month 1pm-2pm, Journey Room  > Creative Journey  3rd Tuesday of the month 1130am-1pm, Journey Room  > Look Good Feel Better  1st Wednesday of the month 10am-12 noon, Journey Room (Call [place] to register [phone])

## 2016-12-15 ENCOUNTER — Ambulatory Visit (INDEPENDENT_AMBULATORY_CARE_PROVIDER_SITE_OTHER): Payer: Medicare Other | Admitting: *Deleted

## 2016-12-15 DIAGNOSIS — I4891 Unspecified atrial fibrillation: Secondary | ICD-10-CM | POA: Diagnosis not present

## 2016-12-15 DIAGNOSIS — Z5181 Encounter for therapeutic drug level monitoring: Secondary | ICD-10-CM | POA: Diagnosis not present

## 2016-12-15 LAB — POCT INR: INR: 2

## 2017-01-05 ENCOUNTER — Encounter (HOSPITAL_COMMUNITY): Payer: Medicare Other | Attending: Hematology and Oncology

## 2017-01-05 ENCOUNTER — Encounter (HOSPITAL_COMMUNITY): Payer: Self-pay

## 2017-01-05 VITALS — BP 106/72 | HR 91 | Temp 98.4°F | Resp 18 | Wt 148.4 lb

## 2017-01-05 DIAGNOSIS — Z95828 Presence of other vascular implants and grafts: Secondary | ICD-10-CM

## 2017-01-05 DIAGNOSIS — C50212 Malignant neoplasm of upper-inner quadrant of left female breast: Secondary | ICD-10-CM | POA: Insufficient documentation

## 2017-01-05 DIAGNOSIS — C50912 Malignant neoplasm of unspecified site of left female breast: Secondary | ICD-10-CM

## 2017-01-05 DIAGNOSIS — Z452 Encounter for adjustment and management of vascular access device: Secondary | ICD-10-CM

## 2017-01-05 DIAGNOSIS — D0511 Intraductal carcinoma in situ of right breast: Secondary | ICD-10-CM

## 2017-01-05 MED ORDER — SODIUM CHLORIDE 0.9% FLUSH
10.0000 mL | INTRAVENOUS | Status: DC | PRN
  Administered 2017-01-05: 10 mL via INTRAVENOUS
  Filled 2017-01-05: qty 10

## 2017-01-05 MED ORDER — HEPARIN SOD (PORK) LOCK FLUSH 100 UNIT/ML IV SOLN
500.0000 [IU] | Freq: Once | INTRAVENOUS | Status: AC
  Administered 2017-01-05: 500 [IU] via INTRAVENOUS

## 2017-01-05 NOTE — Progress Notes (Signed)
Stacie Huang presented for Portacath access and flush. Portacath located right chest wall accessed with  H 20 needle. Good blood return present. Portacath flushed with 39ml NS and 500U/33ml Heparin and needle removed intact. Procedure without incident. Patient tolerated treatment without incidence. Patient discharged ambulatory and in stable condition from clinic. Patient to follow up as scheduled.

## 2017-01-05 NOTE — Patient Instructions (Signed)
[facility] at [facility] Discharge Instructions  RECOMMENDATIONS MADE BY THE CONSULTANT AND ANY TEST RESULTS WILL BE SENT TO YOUR REFERRING PHYSICIAN.  You had your port flushed today Continue to get it flushed every 6-8 weeks Follow up as scheduled.  Thank you for choosing [facility] at [facility] to provide your oncology and hematology care.  To afford each patient quality time with our provider, please arrive at least 15 minutes before your scheduled appointment time.    If you have a lab appointment with the Cancer Center please come in thru the  Main Entrance and check in at the main information desk  You need to re-schedule your appointment should you arrive 10 or more minutes late.  We strive to give you quality time with our providers, and arriving late affects you and other patients whose appointments are after yours.  Also, if you no show three or more times for appointments you may be dismissed from the clinic at the providers discretion.     Again, thank you for choosing [facility].  Our hope is that these requests will decrease the amount of time that you wait before being seen by our physicians.       _____________________________________________________________  Should you have questions after your visit to [facility], please contact our office at [phone] between the hours of 8:30 a.m. and 4:30 p.m.  Voicemails left after 4:30 p.m. will not be returned until the following business day.  For prescription refill requests, have your pharmacy contact our office.       Resources For Cancer Patients and their Caregivers ? American Cancer Society: Can assist with transportation, wigs, general needs, runs Look Good Feel Better.        1-888-227-6333 ? Cancer Care: Provides financial assistance, online support groups, medication/co-pay assistance.  1-800-813-HOPE (4673) ? [facility] Assists [place] cancer patients and their families through emotional , educational and financial support.  336-427-4357 ? [place] DSS Where to apply for food stamps, Medicaid and utility assistance. 336-342-1394 ? [facility]: Transportation to medical appointments. 336-347-2287 ? Social Security Administration: May apply for disability if have a Stage IV cancer. 336-342-7796 1-800-772-1213 ? [place] Aging, Disability and Transit Services: Assists with nutrition, care and transit needs. 336-349-2343  Cancer Center Support Programs: @10RELATIVEDAYS@ > Cancer Support Group  2nd Tuesday of the month 1pm-2pm, Journey Room  > Creative Journey  3rd Tuesday of the month 1130am-1pm, Journey Room  > Look Good Feel Better  1st Wednesday of the month 10am-12 noon, Journey Room (Call American Cancer Society to register 1-800-395-5775)    

## 2017-01-15 ENCOUNTER — Other Ambulatory Visit (HOSPITAL_COMMUNITY): Payer: Self-pay | Admitting: Oncology

## 2017-02-13 ENCOUNTER — Other Ambulatory Visit: Payer: Self-pay | Admitting: Adult Health

## 2017-02-18 ENCOUNTER — Other Ambulatory Visit (HOSPITAL_COMMUNITY): Payer: Self-pay | Admitting: Oncology

## 2017-02-18 DIAGNOSIS — Z853 Personal history of malignant neoplasm of breast: Secondary | ICD-10-CM

## 2017-02-23 ENCOUNTER — Ambulatory Visit (INDEPENDENT_AMBULATORY_CARE_PROVIDER_SITE_OTHER): Payer: Medicare Other | Admitting: *Deleted

## 2017-02-23 DIAGNOSIS — I4891 Unspecified atrial fibrillation: Secondary | ICD-10-CM | POA: Diagnosis not present

## 2017-02-23 DIAGNOSIS — Z5181 Encounter for therapeutic drug level monitoring: Secondary | ICD-10-CM

## 2017-02-23 LAB — POCT INR: INR: 2.1

## 2017-02-24 ENCOUNTER — Encounter (HOSPITAL_COMMUNITY): Payer: Self-pay

## 2017-02-24 ENCOUNTER — Ambulatory Visit (HOSPITAL_COMMUNITY)
Admission: RE | Admit: 2017-02-24 | Discharge: 2017-02-24 | Disposition: A | Discharge status: Home / Self Care | Payer: Medicare Other | Source: Ambulatory Visit | Attending: Adult Health | Admitting: Adult Health

## 2017-02-24 DIAGNOSIS — Z170 Estrogen receptor positive status [ER+]: Secondary | ICD-10-CM | POA: Diagnosis present

## 2017-02-24 DIAGNOSIS — Z9889 Other specified postprocedural states: Secondary | ICD-10-CM | POA: Diagnosis not present

## 2017-02-24 DIAGNOSIS — C50212 Malignant neoplasm of upper-inner quadrant of left female breast: Secondary | ICD-10-CM | POA: Diagnosis present

## 2017-02-24 DIAGNOSIS — C50911 Malignant neoplasm of unspecified site of right female breast: Secondary | ICD-10-CM

## 2017-02-24 DIAGNOSIS — Z853 Personal history of malignant neoplasm of breast: Secondary | ICD-10-CM | POA: Diagnosis not present

## 2017-02-27 ENCOUNTER — Encounter (HOSPITAL_COMMUNITY): Payer: Medicare Other

## 2017-02-27 ENCOUNTER — Encounter (HOSPITAL_COMMUNITY): Payer: Self-pay | Admitting: Oncology

## 2017-02-27 ENCOUNTER — Encounter (HOSPITAL_COMMUNITY): Payer: Medicare Other | Attending: Hematology and Oncology | Admitting: Oncology

## 2017-02-27 VITALS — BP 128/92 | HR 56 | Temp 98.3°F | Resp 16 | Ht 62.0 in | Wt 145.5 lb

## 2017-02-27 DIAGNOSIS — Z79811 Long term (current) use of aromatase inhibitors: Secondary | ICD-10-CM | POA: Diagnosis not present

## 2017-02-27 DIAGNOSIS — Z95828 Presence of other vascular implants and grafts: Secondary | ICD-10-CM

## 2017-02-27 DIAGNOSIS — Z5181 Encounter for therapeutic drug level monitoring: Secondary | ICD-10-CM

## 2017-02-27 DIAGNOSIS — Z7901 Long term (current) use of anticoagulants: Secondary | ICD-10-CM

## 2017-02-27 DIAGNOSIS — Z170 Estrogen receptor positive status [ER+]: Secondary | ICD-10-CM

## 2017-02-27 DIAGNOSIS — I480 Paroxysmal atrial fibrillation: Secondary | ICD-10-CM | POA: Diagnosis not present

## 2017-02-27 DIAGNOSIS — E559 Vitamin D deficiency, unspecified: Secondary | ICD-10-CM

## 2017-02-27 DIAGNOSIS — I10 Essential (primary) hypertension: Secondary | ICD-10-CM | POA: Diagnosis not present

## 2017-02-27 DIAGNOSIS — Z79899 Other long term (current) drug therapy: Secondary | ICD-10-CM

## 2017-02-27 DIAGNOSIS — C50912 Malignant neoplasm of unspecified site of left female breast: Secondary | ICD-10-CM

## 2017-02-27 DIAGNOSIS — C50911 Malignant neoplasm of unspecified site of right female breast: Secondary | ICD-10-CM

## 2017-02-27 DIAGNOSIS — M8580 Other specified disorders of bone density and structure, unspecified site: Secondary | ICD-10-CM | POA: Diagnosis not present

## 2017-02-27 DIAGNOSIS — C50212 Malignant neoplasm of upper-inner quadrant of left female breast: Secondary | ICD-10-CM

## 2017-02-27 DIAGNOSIS — Z780 Asymptomatic menopausal state: Secondary | ICD-10-CM

## 2017-02-27 DIAGNOSIS — D0511 Intraductal carcinoma in situ of right breast: Secondary | ICD-10-CM | POA: Diagnosis present

## 2017-02-27 MED ORDER — SODIUM CHLORIDE 0.9% FLUSH
10.0000 mL | INTRAVENOUS | Status: DC | PRN
Start: 2017-02-27 — End: 2017-02-27
  Administered 2017-02-27: 10 mL via INTRAVENOUS
  Filled 2017-02-27: qty 10

## 2017-02-27 MED ORDER — HEPARIN SOD (PORK) LOCK FLUSH 100 UNIT/ML IV SOLN
500.0000 [IU] | Freq: Once | INTRAVENOUS | Status: AC
  Administered 2017-02-27: 500 [IU] via INTRAVENOUS

## 2017-02-27 NOTE — Progress Notes (Signed)
Stacie Huang presented for Portacath access and flush. Portacath located right chest wall accessed with  H 20 needle.  Good blood return present. Portacath flushed with 23ml NS and 500U/17ml Heparin and needle removed intact.  Procedure tolerated well and without incident.   Pt stable and discharged home ambulatory.

## 2017-02-27 NOTE — Patient Instructions (Addendum)
[place] at [facility] Discharge Instructions  RECOMMENDATIONS MADE BY THE CONSULTANT AND ANY TEST RESULTS WILL BE SENT TO YOUR REFERRING PHYSICIAN.  Continue taking your Arimidex daily. Continue Calcium 1200 mg and Vitamin D 1000-2000 units daily. Your next Prolia injection is due in Jan 2019. Labs in Jan 2019. We will refer you to have your port removed. Bone density exam is due in April 2019. Next mammogram will be due in October/November 2019. Return in 4 months for follow-up.  Thank you for choosing [place] at [facility] to provide your oncology and hematology care.  To afford each patient quality time with our provider, please arrive at least 15 minutes before your scheduled appointment time.    If you have a lab appointment with the [facility] please come in thru the  Main Entrance and check in at the main information desk  You need to re-schedule your appointment should you arrive 10 or more minutes late.  We strive to give you quality time with our providers, and arriving late affects you and other patients whose appointments are after yours.  Also, if you no show three or more times for appointments you may be dismissed from the clinic at the providers discretion.     Again, thank you for choosing [facility].  Our hope is that these requests will decrease the amount of time that you wait before being seen by our physicians.       _____________________________________________________________  Should you have questions after your visit to [facility] please contact our office at [phone] between the hours of 8:30 a.m. and 4:30 p.m.  Voicemails left after 4:30 p.m. will not be returned until the following business day.  For prescription refill requests, have your pharmacy contact our office.       Resources For Cancer Patients and their Caregivers ? American Cancer Society: Can assist with  transportation, wigs, general needs, runs Look Good Feel Better.        570-698-5407 ? Cancer Care: Provides financial assistance, online support groups, medication/co-pay assistance.  1-800-813-HOPE 414 301 3064) ? [facility] Assists [place] cancer patients and their families through emotional , educational and financial support.  617-032-1298 ? [place] DSS Where to apply for food stamps, Medicaid and utility assistance. 516-363-0914 ? RCATS: Transportation to medical appointments. 810-230-5248 ? Social Security Administration: May apply for disability if have a Stage IV cancer. (657)130-6216 (747) 122-3891 ? [employer], Disability and Transit Services: Assists with nutrition, care and transit needs. [place] Support Programs: @10RELATIVEDAYS @ > Cancer Support Group  2nd Tuesday of the month 1pm-2pm, Journey Room  > Creative Journey  3rd Tuesday of the month 1130am-1pm, Journey Room  > Look Good Feel Better  1st Wednesday of the month 10am-12 noon, Journey Room (Call [facility] to register [phone]) [phone])

## 2017-02-27 NOTE — Assessment & Plan Note (Addendum)
Osteopenia in the setting of AI therapy.  Prolia started on 10/08/2015 in combination with Ca++ and Vit D.  Prolia last given in 10/2016  Next injection is due in Jan 2019.  Next bone density exam is due in 07/2017.

## 2017-02-27 NOTE — Assessment & Plan Note (Addendum)
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  Tumor was ER+/PR+/HER2+.  She was treated with definitive left lumptectomy upfront, followed by Taxol/Herceptin x 12 weekly cycles, Taxol was discontinued after 2 cycles due to toxicities.  Herceptin was continued x 52 weeks and she was started on anti-endocrine therapy beginning in 06/2013.  She did not receive adjuvant XRT.  She continues on Anastrozole daily.  No oncology role for labs today.  Port flush today.  Labs in 04/2017: CBC diff, CMET, Vit D.  I personally reviewed and went over radiographic studies with the patient.  The results are noted within this dictation.  I personally reviewed the images in PACS.  Mammogram on 02/24/2017 was BIRADS 2.  She will be due for her next mammogram in 01/2018.  She wishes to have her port removed which is reasonable.  Will refer her to Gen Surg for port removal.  Continue Anti-endocrine therapy as prescribed.  Return in 4 months for follow-up.

## 2017-02-27 NOTE — Progress Notes (Signed)
Stacie Evens, MD [place] [address]  Malignant neoplasm of upper-inner quadrant of left breast in female, estrogen receptor positive ([place]) - Plan: DG Bone Density, MM DIAG BREAST TOMO BILATERAL, CBC with Differential, Comprehensive metabolic panel, Vitamin D 25 hydroxy  Invasive ductal carcinoma of breast, female, right ([place])  Osteopenia determined by x-ray - Plan: DG Bone Density  Encounter for therapeutic drug monitoring  Paroxysmal atrial fibrillation (HCC)  Chronic anticoagulation  High risk medication use  Essential hypertension  Vitamin D deficiency - Plan: Vitamin D 25 hydroxy  Aromatase inhibitor use - Plan: DG Bone Density  Post-menopausal - Plan: DG Bone Density  CURRENT THERAPY: Arimidex daily beginning in 06/2013.  Prolia every 6 months.  INTERVAL HISTORY: Stacie Huang 76 y.o. female returns for followup of B/L breast cancer.  Both breast cancers were stage I.  Left breast cancer diagnosed in 01/2013 and was ER/PR positive and HER-2  positive.  Right breast cancer was diagnosed in 02/2016 and was triple negative.  She is tolerating anti-endocrine therapy well without any significant complaints related to hot flashes, arthralgias, myalgias, and dizziness.  She denies any new pain.  She denies any new cough.  She denies hemoptysis.  She denies any neurological complaints including headaches and dizziness.  She denies any lumps or bumps on her own examination.  She would like to have her port removed.  HPI Elements   Location: Right breast and left breast  Quality: Invasive ductal carcinoma in both breasts  Severity: Stage I in both breasts  Duration: Left breast cancer in 01/2013.  Right breast cancer in 02/2016  Context: Left breast cancer ER+/PR+/HER2+.  Right breast cancer was ER-/PR-/HER2-  Timing: Started anti-endocrine therapy in 06/2013 for left breast cancer  Modifying Factors: Osteopenia in setting of AI therapy.  Associated  Signs & Symptoms:      Breast cancer of upper-inner quadrant of left female breast ([place])   02/04/2013 Initial Diagnosis    Breast cancer of upper-inner quadrant of left female breast      02/23/2013 Surgery    left lumpectomy (UIQ), sentinel node biopsy--0.7cm, Node negative, ER+, PR+, HER-2/neu over expressed.      04/08/2013 - 04/14/2013 Chemotherapy    Paclitaxel/Herceptin weekly x 12. Intolerance to Paclitaxel and only receiving 2 cycles      05/04/2013 - 04/05/2014 Chemotherapy    Herceptin every 21 days       06/28/2013 -  Chemotherapy    Anastrazole started      08/02/2013 Imaging    Bone density- normal      04/06/2014 Imaging    MUGA- Left ventricular ejection fraction equals 53%.      08/06/2015 Imaging    Bone density- osteopenia      03/06/2016 Procedure    Stereotactic-guided biopsy of right breast upper outer quadrant focal asymmetry/calcifications. No apparent complications.      03/06/2016 Procedure    Successful placement of coil shaped marker within the right breast upper outer quadrant biopsy site, post stereotactic core needle biopsy.       Invasive ductal carcinoma of breast, female, right ([place])   02/26/2016 Mammogram    1. Focal asymmetry within the upper-outer quadrant of the right breast, with associated new calcifications. This is a suspicious finding for which stereotactic biopsy with 3D tomosynthesis guidance is recommended. 2. Cluster of cysts at the 10 o'clock axis, 5 cm from the nipple, measuring 1.2 x 0.6 x 1.1  cm, a possible correlate for the mammographic asymmetry.      03/06/2016 Procedure    Right needle core biopsy      03/07/2016 Pathology Results    Breast, right, needle core biopsy, upper outer quadrant - HIGH GRADE DUCTAL CARCINOMA IN SITU WITH NECROSIS AND ASSOCIATED CALCIFICATIONS.IMMUNOHISTOCHEMICAL AND MORPHOMETRIC ANALYSIS PERFORMED MANUALLY Estrogen Receptor: 0%, NEGATIVE Progesterone Receptor: 0%, NEGATIVE       04/02/2016 Procedure    Breast, lumpectomy, right by Dr. Arnoldo Morale      04/07/2016 Pathology Results    INVASIVE DUCTAL CARCINOMA, GRADE 2, SPANNING 0.6 CM THE CARCINOMA IS FOCALLY PRESENTED AT THE CAUTERIZED MEDIAL MARGIN EXTENSIVE DUCTAL CARCINOMA IN SITU, GRADE 3 ALL OTHER SURGICAL MARGIN ARE NEGATIVE FOR CARCINOMA ER 0% PR 0% Ki-67 15% HER2 NEGATIVE      05/05/2016 Pathology Results    MammaPrint: LOW RISK (97.8% of low risk Mammaprint patients who were treated with anti-hormonal therapy alone are living without distant recurrence of breast cancer at 5-years).      06/02/2016 - 07/04/2016 Radiation Therapy    XRT in [place], [place].  Right breast 42.56 Gy delivered in 16 fractions at 2.65 Gy per fraction and a right tumor bed boost 16 Gy delivered in 8 fractions at 2 Gy per fraction for a total dose of 58.56 Gy completed on 07/04/2016.        Review of Systems  Constitutional: Negative.  Negative for chills, fever and weight loss.  HENT: Negative.   Eyes: Negative.   Respiratory: Negative.  Negative for cough.   Cardiovascular: Negative.  Negative for chest pain.  Gastrointestinal: Negative.  Negative for blood in stool, constipation, diarrhea, melena, nausea and vomiting.  Genitourinary: Negative.   Musculoskeletal: Negative.   Skin: Negative.   Neurological: Negative.  Negative for weakness.  Endo/Heme/Allergies: Negative.   Psychiatric/Behavioral: Negative.     Past Medical History:  Diagnosis Date  . Anxiety   . Breast cancer ([place])   . Cancer ([place])   . Chronic anticoagulation   . GERD (gastroesophageal reflux disease)   . Hyperlipidemia   . Hypertension   . Invasive ductal carcinoma of breast, female, right ([place]) 05/08/2016  . Osteopenia determined by x-ray 10/03/2015  . Paroxysmal atrial fibrillation [facility])     Past Surgical History:  Procedure Laterality Date  . ABDOMINAL HYSTERECTOMY    . AXILLARY LYMPH NODE DISSECTION Left 02/23/2013   Procedure: LEFT AXILLARY LYMPH  NODE DISSECTION;  Surgeon: Scherry Ran, MD;  Location: [facility];  Service: General;  Laterality: Left;  . CHOLECYSTECTOMY    . COLONOSCOPY  03/10/2002   RMR: Incomplete colonoscopy (sigmoidoscopy)/Normal rectum/Normal colon to 40 cm.   . ESOPHAGOGASTRODUODENOSCOPY  03/10/2002   HQP:RFFMBW esophagus/small HH/Tiny AVM in antrum, otherwise normal stomach and D1 and D2/Status post passage of the [facility] dilator  . PARTIAL MASTECTOMY WITH AXILLARY SENTINEL LYMPH NODE BIOPSY Left 02/23/2013   Procedure: LEFT PARTIAL MASTECTOMY WITH AXILLARY SIMPLE NODE BIOPSY, LEFT BREAST WIDE EXCISION;  Surgeon: Scherry Ran, MD;  Location: [facility];  Service: General;  Laterality: Left;  . PARTIAL MASTECTOMY WITH NEEDLE LOCALIZATION Right 04/02/2016   Procedure: RIGHT PARTIAL MASTECTOMY AFTER NEEDLE LOCALIZATION;  Surgeon: Aviva Signs, MD;  Location: [facility];  Service: General;  Laterality: Right;  . PORTACATH PLACEMENT Right 04/07/2013  . PORTACATH PLACEMENT Right 04/07/2013   Procedure: INSERTION PORT-A-CATH;  Surgeon: Scherry Ran, MD;  Location: [facility];  Service: General;  Laterality: Right;    Family History  Problem Relation  Age of Onset  . Heart disease Mother        Also 56 of 9 siblings  . Heart attack Father   . Leukemia Unknown   . Leukemia Unknown   . Colon cancer Neg Hx     Social History   Social History  . Marital status: Legally Separated    Spouse name: N/A  . Number of children:  2  . Years of education: N/A   Occupational History  . Retired from [employer]  . Smoking status: Never Smoker  . Smokeless tobacco: Never Used  . Alcohol use No  . Drug use: No  . Sexual activity: Yes    Birth control/ protection: Surgical   Other Topics Concern  . Not on file   Social History Narrative   2 adult children     PHYSICAL EXAMINATION  ECOG PERFORMANCE STATUS: 0 - Asymptomatic  Vitals:   02/27/17 1403  BP: (!) 128/92    Pulse: (!) 56  Resp: 16  Temp: 98.3 F (36.8 C)  SpO2: 97%    GENERAL:alert, healthy, no distress, well nourished, well developed, comfortable, cooperative and smiling SKIN: skin color, texture, turgor are normal, no rashes or significant lesions HEAD: Normocephalic, No masses, lesions, tenderness or abnormalities EYES: normal, EOMI EARS: External ears normal OROPHARYNX:lips, buccal mucosa, and tongue normal  NECK: supple, no adenopathy, trachea midline LYMPH:  no palpable lymphadenopathy BREAST:risk and benefit of breast self-exam was discussed, not examined LUNGS: clear to auscultation and percussion HEART: regular rate & rhythm ABDOMEN:abdomen soft, obese and normal bowel sounds BACK: Back symmetric, no curvature. EXTREMITIES:less then 2 second capillary refill, no joint deformities, effusion, or inflammation, no edema, no skin discoloration, no clubbing, no cyanosis  NEURO: alert & oriented x 3 with fluent speech, no focal motor/sensory deficits, gait normal   LABORATORY DATA: CBC    Component Value Date/Time   WBC 6.2 11/24/2016 1016   RBC 4.26 11/24/2016 1016   HGB 12.7 11/24/2016 1016   HCT 39.6 11/24/2016 1016   PLT 251 11/24/2016 1016   MCV 93.0 11/24/2016 1016   MCH 29.8 11/24/2016 1016   MCHC 32.1 11/24/2016 1016   RDW 14.0 11/24/2016 1016   LYMPHSABS 1.7 11/24/2016 1016   MONOABS 0.9 11/24/2016 1016   EOSABS 0.1 11/24/2016 1016   BASOSABS 0.0 11/24/2016 1016      Chemistry      Component Value Date/Time   NA 138 11/24/2016 1016   K 4.4 11/24/2016 1016   CL 104 11/24/2016 1016   CO2 24 11/24/2016 1016   BUN 14 11/24/2016 1016   CREATININE 1.03 (H) 11/24/2016 1016      Component Value Date/Time   CALCIUM 9.3 11/24/2016 1016   ALKPHOS 45 11/24/2016 1016   AST 25 11/24/2016 1016   ALT 17 11/24/2016 1016   BILITOT 0.9 11/24/2016 1016        PENDING LABS:   RADIOGRAPHIC STUDIES:  Mm Diag Breast Tomo Bilateral  Result Date:  02/24/2017 CLINICAL DATA:  76 year old patient presents for annual exam. History of left breast lumpectomy in 2014 and right breast lumpectomy in 2017. EXAM: 2D DIGITAL DIAGNOSTIC BILATERAL MAMMOGRAM WITH CAD AND ADJUNCT TOMO COMPARISON:  Previous exam(s). ACR Breast Density Category b: There are scattered areas of fibroglandular density. FINDINGS: There are lumpectomy changes with a postoperative seroma in the deep upper outer right breast. There are lumpectomy changes in the upper inner left breast. No nonsurgical distortion,  suspicious microcalcification, or mass is identified in either breast to suggest malignancy. Surgical clips are seen in the left axilla. Mammographic images were processed with CAD. IMPRESSION: No evidence of malignancy in either breast. Bilateral lumpectomy changes. RECOMMENDATION: Diagnostic mammogram is suggested in 1 year. (Code:DM-B-01Y) I have discussed the findings and recommendations with the patient. Results were also provided in writing at the conclusion of the visit. If applicable, a reminder letter will be sent to the patient regarding the next appointment. BI-RADS CATEGORY  2: Benign. Electronically Signed   By: Curlene Dolphin M.D.   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  Tumor was ER+/PR+/HER2+.  She was treated with definitive left lumptectomy upfront, followed by Taxol/Herceptin x 12 weekly cycles, Taxol was discontinued after 2 cycles due to toxicities.  Herceptin was continued x 52 weeks and she was started on anti-endocrine therapy beginning in 06/2013.  She did not receive adjuvant XRT.  She continues on Anastrozole daily.  No oncology role for labs today.  Port flush today.  Labs in 04/2017: CBC diff, CMET, Vit D.  I personally reviewed and went over radiographic  studies with the patient.  The results are noted within this dictation.  I personally reviewed the images in PACS.  Mammogram on 02/24/2017 was BIRADS 2.  She will be due for her next mammogram in 01/2018.  She wishes to have her port removed which is reasonable.  Will refer her to Gen Surg for port removal.  Continue Anti-endocrine therapy as prescribed.  Return in 4 months for follow-up.   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  She is S/P right lumpectomy revealing a ER-/PR-/HER2- cancer.  Mammaprint was completed and she was identified to have LOW risk disease.  Thus, no adjuvant chemotherapy was recommended.  She did undergo adjuvant XRT completed on 07/04/2016  Osteopenia determined by x-ray Osteopenia in the setting of AI therapy.  Prolia started on 10/08/2015 in combination with Ca++ and Vit D.  Prolia last given in 10/2016  Next injection is due in Jan 2019.  Next bone density exam is due in 07/2017.   4. Encounter for therapeutic drug monitoring Tolerating her Arimidex well.  5. Paroxysmal atrial fibrillation (HCC) Normal sinus rhythm on examination today.  6. Chronic anticoagulation Vitamin K antagonist therapy, monitored by cardiology.  7. High risk medication use No toxicities/side effects associated with aromatase inhibitor.  8. Essential hypertension Blood pressure today is 128/92.  9. Vitamin D deficiency We will check vitamin D level in January 2019.  She is on vitamin D daily for her osteopenia. - Vitamin D 25 hydroxy; Future  10. Aromatase inhibitor use Compliance encouraged and confirmed today. - DG Bone Density; Future  11. Post-menopausal A bone density is ordered and planned for April 2019. - DG Bone Density; Future   Final Result of Complexity      Choose decision making level with 2 or 3 checks OR choose the decision making level on  Section B       A Number of diagnoses or treatment options  '[]'   </= 1 Minimal  '[]'   2 Limited  '[x]'   3 Multiple  '[]'   >/= 4 Extensive  B Amount and complexity of data  '[]'   </=  1 Minimal or low  '[]'   2 Limited  '[]'   3  Moderate  '[x]'   >/= 4 Extensive  C Highest risk  '[]'   Minimal  '[]'   Low  '[x]'   Moderate  '[]'   High   Type of decision making  '[]'   Straight-forward  '[]'   Low Complexity  '[x]'   Moderate- Complexity  '[]'   High- Complexity     ORDERS PLACED FOR THIS ENCOUNTER: Orders Placed This Encounter  Procedures  . DG Bone Density  . MM DIAG BREAST TOMO BILATERAL  . CBC with Differential  . Comprehensive metabolic panel  . Vitamin D 25 hydroxy    MEDICATIONS PRESCRIBED THIS ENCOUNTER: No orders of the defined types were placed in this encounter.   THERAPY PLAN:  Continue with anti-endocrine therapy daily.  Continue with Prolia every 6 months.  Continue with calcium and vitamin D therapy.  NCCN guidelines recommends the following surveillance for invasive breast cancer (1.2018):  A. History and Physical exam 1-4 times per year as clinically appropriate for 5 years, then annually.  B. Periodic screening for changes in family history and referral to genetics counseling as indicated  C. Educate, monitor, and refer to lymphedema management.  D. Mammography every 12 months  E. Routine imaging of reconstructed breast is not indicated.  F. In the absence of clinical signs and symptoms suggestive of recurrent disease, there is no indication for laboratory or imaging studies for metastases screening.  G. Women on Tamoxifen: annual gynecologic assessment every 12 months if uterus is present.  H. Women on aromatase inhibitor or who experience ovarian failure secondary to treatment should have monitoring of bone health with a bone mineral density determination at baseline and periodically thereafter.  I. Assess and encourage adherence to adjuvant endocrine therapy.  J. Evidence  suggests that active lifestyle, healthy diet, limited alcohol intake, and achieving and maintaining an ideal body weight (20-25 BMI) may lead to optimal breast cancer outcomes.   All questions were answered. The patient knows to call the clinic with any problems, questions or concerns. We can certainly see the patient much sooner if necessary.  Patient and plan discussed with Dr. Twana First and she is in agreement with the aforementioned.   This note is electronically signed by: Doy Mince 02/27/2017 2:37 PM

## 2017-02-27 NOTE — Assessment & Plan Note (Signed)
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  She is S/P right lumpectomy revealing a ER-/PR-/HER2- cancer.  Mammaprint was completed and she was identified to have LOW risk disease.  Thus, no adjuvant chemotherapy was recommended.  She did undergo adjuvant XRT completed on 07/04/2016

## 2017-03-05 ENCOUNTER — Other Ambulatory Visit: Payer: Self-pay

## 2017-03-05 ENCOUNTER — Emergency Department (HOSPITAL_COMMUNITY): Payer: Medicare Other

## 2017-03-05 ENCOUNTER — Encounter (HOSPITAL_COMMUNITY): Payer: Self-pay | Admitting: *Deleted

## 2017-03-05 ENCOUNTER — Emergency Department (HOSPITAL_COMMUNITY)
Admission: EM | Admit: 2017-03-05 | Discharge: 2017-03-05 | Disposition: A | Discharge status: Home / Self Care | Payer: Medicare Other | Attending: Emergency Medicine | Admitting: Emergency Medicine

## 2017-03-05 DIAGNOSIS — Z853 Personal history of malignant neoplasm of breast: Secondary | ICD-10-CM | POA: Insufficient documentation

## 2017-03-05 DIAGNOSIS — I10 Essential (primary) hypertension: Secondary | ICD-10-CM | POA: Diagnosis not present

## 2017-03-05 DIAGNOSIS — Z79899 Other long term (current) drug therapy: Secondary | ICD-10-CM | POA: Diagnosis not present

## 2017-03-05 DIAGNOSIS — Z7901 Long term (current) use of anticoagulants: Secondary | ICD-10-CM | POA: Diagnosis not present

## 2017-03-05 DIAGNOSIS — M25551 Pain in right hip: Secondary | ICD-10-CM | POA: Diagnosis not present

## 2017-03-05 MED ORDER — TRAMADOL HCL 50 MG PO TABS: 50.0000 mg | ORAL_TABLET | Freq: Four times a day (QID) | ORAL | 0 refills | Status: DC | PRN

## 2017-03-05 MED ORDER — ACETAMINOPHEN 325 MG PO TABS
650.0000 mg | ORAL_TABLET | Freq: Once | ORAL | Status: AC
  Administered 2017-03-05: 650 mg via ORAL
  Filled 2017-03-05: qty 2

## 2017-03-05 NOTE — ED Triage Notes (Addendum)
Pt c/o right hip pain that radiates down to right knee x 1 week. Pt denies injury. Pt reports pain with ambulation. Denies swelling or redness to right leg.

## 2017-03-05 NOTE — ED Notes (Signed)
Ambulated to bathroom twice without difficulty.  C/o of slight discomfort but able to ambulate without assistance.

## 2017-03-05 NOTE — ED Provider Notes (Signed)
[facility] EMERGENCY DEPARTMENT Provider Note   CSN: [id number] Arrival date & time: 03/05/17  1038     History   Chief Complaint Chief Complaint  Patient presents with  . Leg Pain    HPI Stacie Huang is a 76 y.o. female with a past medical history of breast cancer, GERD, hypertension, paroxysmal atrial fibrillation on chronic Coumadin therapy presenting with right hip pain which has been present for the past 2 weeks.  She denies any injuries or falls, no swelling, redness or rash to the site and denies low back pain.  Her pain starts in her right lateral upper thigh and radiates to her knee and is worsened with weightbearing only.  She denies pain when sitting or lying or movement of the leg.  She has taken Tylenol without improvement in symptoms.  The history is provided by the patient.    Past Medical History:  Diagnosis Date  . Anxiety   . Breast cancer ([place])   . Cancer ([place])   . Chronic anticoagulation   . GERD (gastroesophageal reflux disease)   . Hyperlipidemia   . Hypertension   . 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 Osteopenia determined by x-ray 10/03/2015  . Paroxysmal atrial fibrillation [facility])     Patient Active Problem List   Diagnosis Date Noted  . 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 High risk medication use 10/03/2015  . Osteopenia determined by x-ray 10/03/2015  . GERD (gastroesophageal reflux disease) 08/11/2013  . Bloating 08/11/2013  . Encounter for therapeutic drug monitoring 06/17/2013  . Paroxysmal atrial fibrillation ([place]) 03/17/2013  . Vitamin D deficiency 03/16/2013  . Breast cancer of upper-inner quadrant of left female breast ([place]) 03/16/2013  . Atrial fibrillation ([place])   . Hypertension   . Chronic anticoagulation 11/11/2010  . Hyperlipidemia 02/26/2010    Past Surgical History:  Procedure Laterality Date  . ABDOMINAL HYSTERECTOMY    . CHOLECYSTECTOMY    .  COLONOSCOPY  03/10/2002   RMR: Incomplete colonoscopy (sigmoidoscopy)/Normal rectum/Normal colon to 40 cm.   . ESOPHAGOGASTRODUODENOSCOPY  03/10/2002   [id number] esophagus/small HH/Tiny AVM in antrum, otherwise normal stomach and D1 and D2/Status post passage of the [facility] dilator  . PORTACATH PLACEMENT Right 04/07/2013    OB History    Gravida Para Term Preterm AB Living   2 2 2          SAB TAB Ectopic Multiple Live Births                   Home Medications    Prior to Admission medications   Medication Sig Start Date End Date Taking? Authorizing Provider  acetaminophen (TYLENOL) 500 MG tablet Take 1,000 mg by mouth daily.    [provider]  anastrozole (ARIMIDEX) 1 MG tablet TAKE 1 TABLET BY MOUTH DAILY. 01/15/17   Holley Bouche, NP  atenolol (TENORMIN) 100 MG tablet Take 50 mg by mouth daily before breakfast.     [provider]  calcium carbonate (OS-CAL) 600 MG TABS tablet Take 600 mg by mouth 2 (two) times daily with a meal.     [provider]  cetirizine (ZYRTEC) 10 MG tablet Take 10 mg by mouth every morning.     [provider]  Cholecalciferol (VITAMIN D) 400 UNITS capsule Take 400 Units by mouth 3 (three) times daily.     [provider]  denosumab (PROLIA) 60 MG/ML SOLN injection Inject 60 mg  into the skin every 6 (six) months. Administer in upper arm, thigh, or abdomen    [provider]  diltiazem (CARDIZEM CD) 120 MG 24 hr capsule TAKE 1 CAPSULE BY MOUTH EVERY DAY. 02/13/17   Arnoldo Lenis, MD  fluticasone (FLONASE) 50 MCG/ACT nasal spray Place 2 sprays into the nose at bedtime.     [provider]  LORazepam (ATIVAN) 1 MG tablet Take 1 mg by mouth daily as needed for anxiety (AND/OR FOR SLEEP).     [provider]  losartan (COZAAR) 50 MG tablet Take 50 mg by mouth every morning.     [provider]  metoCLOPramide (REGLAN) 10 MG tablet TAKE 1 TABLET BY MOUTH EVERY 8 HOURS AS  NEEDED FOR NAUSEA. 11/17/16   Baird Cancer, PA-C  pantoprazole (PROTONIX) 40 MG tablet TAKE (1) TABLET BY MOUTH TWICE DAILY. 08/09/15   Annitta Needs, NP  psyllium (METAMUCIL) 58.6 % powder Take 1 packet by mouth daily.     [provider]  simvastatin (ZOCOR) 10 MG tablet Take 10 mg by mouth at bedtime.      [provider]  traMADol (ULTRAM) 50 MG tablet Take 1 tablet (50 mg total) every 6 (six) hours as needed by mouth. 03/05/17   Evalee Jefferson, PA-C  warfarin (COUMADIN) 2.5 MG tablet TAKE ONE TABLET BY MOUTH ON TUESDAYS AND FRIDAYS; TAKE 1/2 TABLET ON ALL OTHER DAYS. Patient taking differently: TAKE ONE TABLET BY MOUTH ON FRIDAYS; TAKE 1/2 TABLET ON ALL OTHER DAYS. 11/03/16   Lendon Colonel, NP    Family History Family History  Problem Relation Age of Onset  . Heart disease Mother        Also 43 of 9 siblings  . Heart attack Father   . Leukemia Unknown   . Leukemia Unknown   . Colon cancer Neg Hx     Social History Social History   Tobacco Use  . Smoking status: Never Smoker  . Smokeless tobacco: Never Used  Substance Use Topics  . Alcohol use: No  . Drug use: No     Allergies   Iohexol and Sulfa antibiotics   Review of Systems Review of Systems  Constitutional: Negative for fever.  Musculoskeletal: Positive for arthralgias and gait problem. Negative for back pain, joint swelling and myalgias.  Neurological: Negative for weakness and numbness.     Physical Exam Updated Vital Signs BP (!) 143/83 (BP Location: Left Arm)   Pulse 87   Temp (!) 97.5 F (36.4 C) (Oral)   Resp 16   Ht 5\' 2"  (1.575 m)   Wt 65.8 kg (145 lb)   SpO2 98%   BMI 26.52 kg/m   Physical Exam  Constitutional: She appears well-developed and well-nourished.  HENT:  Head: Atraumatic.  Neck: Normal range of motion.  Cardiovascular:  Pulses equal bilaterally  Musculoskeletal: She exhibits no tenderness.       Right hip: She exhibits normal range of motion, no bony  tenderness, no swelling and no crepitus.       Legs: Pain along right lateral pelvis through right greater trochanter without reproducible pain with palpation.  There is no edema, erythema or rash.  There is no pain with active and passive right hip flexion, extension internal and external rotation.  Knee is nontender.  No midline lumbar pain.  Neurological: She is alert. She has normal strength. She displays normal reflexes. No sensory deficit.  Skin: Skin is warm and dry.  Psychiatric: She  has a normal mood and affect.     ED Treatments / Results  Labs (all labs ordered are listed, but only abnormal results are displayed) Labs Reviewed - No data to display  EKG  EKG Interpretation None       Radiology Dg Hip Unilat W Or W/o Pelvis 2-3 Views Right  Result Date: 03/05/2017 CLINICAL DATA:  Acute right hip pain for several weeks. No known injury. Initial encounter. EXAM: DG HIP (WITH OR WITHOUT PELVIS) 2-3V RIGHT COMPARISON:  None. FINDINGS: There is no evidence of hip fracture or dislocation. There is no evidence of arthropathy or other focal bone abnormality. IMPRESSION: Negative. Electronically Signed   By: Margarette Canada M.D.   On: 03/05/2017 13:28    Procedures Procedures (including critical care time)  Medications Ordered in ED Medications  acetaminophen (TYLENOL) tablet 650 mg (650 mg Oral Given 03/05/17 1421)     Initial Impression / Assessment and Plan / ED Course  I have reviewed the triage vital signs and the nursing notes.  Pertinent labs & imaging results that were available during my care of the patient were reviewed by me and considered in my medical decision making (see chart for details).     Right hip and buttock pain with no reproducible pain on exam.  Patient is ambulatory in the ED.  Imaging reviewed and negative for acute source of her pain.  She was seen by Dr. Lita Mains during this visit.  We will plan Tylenol, heat therapy and a small supply of tramadol  with precautions given.  Plan to follow-up with her PCP for recheck in 1 week if symptoms persist or are not improving. Final Clinical Impressions(s) / ED Diagnoses   Final diagnoses:  Right hip pain    ED Discharge Orders        Ordered    traMADol (ULTRAM) 50 MG tablet  Every 6 hours PRN     03/05/17 1456       Evalee Jefferson, PA-C 03/05/17 1547    Julianne Rice, MD 03/11/17 1904

## 2017-03-05 NOTE — Discharge Instructions (Signed)
Your exam and xray is reassuring today.  You may have pain that is radiating from your back, a condition called sciatica, even though your back is not hurting today.  Your hip xray is ok. Apply a heating pad 20 minutes several times daily. Take tylenol 650 mg every 6 hours for the next week.  You may also take the medicine prescribed, but this may make you sleepy - do not drive within 4 hours of taking this prescription and use caution while on this medicine.

## 2017-03-10 ENCOUNTER — Telehealth (HOSPITAL_COMMUNITY): Payer: Self-pay

## 2017-03-10 NOTE — Telephone Encounter (Signed)
Continued from 03/10/17 10 am note: patient states the Er MD told her to follow up with her PCP and gave her some pain medication. She did not go to her PCP because she states he will not know what to do. After reviewing with Dr. Talbert Cage, she stated for patient to follow up with PCP as directed by ER. She probably has arthritis. Explained this to patient who verbalized understanding.

## 2017-03-10 NOTE — Telephone Encounter (Signed)
Patient called stating she was having right hip pain from her groin to her knee and wanted to know if the pain was coming from her Prolia injection. She states she went to the ER and had x-rays they told her Reviewed chart and reviewed with provider. Last Prolia was in July.

## 2017-03-13 ENCOUNTER — Other Ambulatory Visit (HOSPITAL_COMMUNITY): Payer: Self-pay | Admitting: Family Medicine

## 2017-03-13 DIAGNOSIS — M79604 Pain in right leg: Secondary | ICD-10-CM

## 2017-03-13 DIAGNOSIS — C50212 Malignant neoplasm of upper-inner quadrant of left female breast: Secondary | ICD-10-CM

## 2017-03-16 ENCOUNTER — Other Ambulatory Visit (HOSPITAL_COMMUNITY): Payer: Self-pay | Admitting: Oncology

## 2017-03-16 ENCOUNTER — Telehealth: Payer: Self-pay | Admitting: *Deleted

## 2017-03-16 NOTE — Telephone Encounter (Signed)
[name] called for Stacie Huang.  She requested that [facility] call her back.

## 2017-03-16 NOTE — Telephone Encounter (Signed)
Pt was started on Prednisone and Tramadol for hip pain.  Prednisone 10mg  tablet.  Take 4 tablets x 3 days, 2 tablets x 3 days, then 1 tablet daily.  Pt is finishing the 4 tablets today.  Told pt to hold coumadin tonight then take 1.25mg  daily and come for INR check on 11/26.  She verbalized understanding.

## 2017-03-17 ENCOUNTER — Ambulatory Visit: Payer: Medicare Other | Admitting: General Surgery

## 2017-03-25 ENCOUNTER — Other Ambulatory Visit (HOSPITAL_COMMUNITY): Payer: Self-pay | Admitting: Family Medicine

## 2017-03-25 ENCOUNTER — Ambulatory Visit (HOSPITAL_COMMUNITY)
Admission: RE | Admit: 2017-03-25 | Discharge: 2017-03-25 | Disposition: A | Discharge status: Home / Self Care | Payer: Medicare Other | Source: Ambulatory Visit | Attending: Family Medicine | Admitting: Family Medicine

## 2017-03-25 DIAGNOSIS — I7090 Unspecified atherosclerosis: Secondary | ICD-10-CM | POA: Diagnosis not present

## 2017-03-25 DIAGNOSIS — R1031 Right lower quadrant pain: Secondary | ICD-10-CM | POA: Diagnosis present

## 2017-03-25 DIAGNOSIS — Z853 Personal history of malignant neoplasm of breast: Secondary | ICD-10-CM | POA: Insufficient documentation

## 2017-03-25 DIAGNOSIS — M79604 Pain in right leg: Secondary | ICD-10-CM

## 2017-03-25 DIAGNOSIS — C50212 Malignant neoplasm of upper-inner quadrant of left female breast: Secondary | ICD-10-CM

## 2017-04-01 ENCOUNTER — Ambulatory Visit (INDEPENDENT_AMBULATORY_CARE_PROVIDER_SITE_OTHER): Payer: Medicare Other | Admitting: *Deleted

## 2017-04-01 DIAGNOSIS — I4891 Unspecified atrial fibrillation: Secondary | ICD-10-CM

## 2017-04-01 DIAGNOSIS — Z5181 Encounter for therapeutic drug level monitoring: Secondary | ICD-10-CM | POA: Diagnosis not present

## 2017-04-01 LAB — POCT INR: INR: 3.1

## 2017-04-13 ENCOUNTER — Other Ambulatory Visit: Payer: Self-pay | Admitting: Adult Health

## 2017-04-29 ENCOUNTER — Encounter (HOSPITAL_COMMUNITY): Payer: Medicare Other

## 2017-05-13 ENCOUNTER — Ambulatory Visit (INDEPENDENT_AMBULATORY_CARE_PROVIDER_SITE_OTHER): Payer: Medicare Other | Admitting: *Deleted

## 2017-05-13 DIAGNOSIS — Z5181 Encounter for therapeutic drug level monitoring: Secondary | ICD-10-CM

## 2017-05-13 DIAGNOSIS — I4891 Unspecified atrial fibrillation: Secondary | ICD-10-CM

## 2017-05-13 LAB — POCT INR: INR: 2

## 2017-05-13 NOTE — Patient Instructions (Signed)
Take coumadin 1 tablet tonight then resume 1/2 tablet daily except 1 tablet on Fridays  Recheck in 6 weeks.

## 2017-05-15 ENCOUNTER — Other Ambulatory Visit (HOSPITAL_COMMUNITY): Payer: Self-pay | Admitting: Adult Health

## 2017-05-25 ENCOUNTER — Encounter (HOSPITAL_COMMUNITY): Payer: Self-pay

## 2017-05-25 ENCOUNTER — Inpatient Hospital Stay (HOSPITAL_COMMUNITY): Payer: Medicare Other

## 2017-05-25 ENCOUNTER — Inpatient Hospital Stay (HOSPITAL_COMMUNITY): Payer: Medicare Other | Attending: Internal Medicine

## 2017-05-25 ENCOUNTER — Other Ambulatory Visit: Payer: Self-pay

## 2017-05-25 VITALS — BP 138/81 | HR 93 | Temp 98.2°F | Resp 20

## 2017-05-25 DIAGNOSIS — Z170 Estrogen receptor positive status [ER+]: Principal | ICD-10-CM

## 2017-05-25 DIAGNOSIS — M8580 Other specified disorders of bone density and structure, unspecified site: Secondary | ICD-10-CM | POA: Diagnosis present

## 2017-05-25 DIAGNOSIS — C50212 Malignant neoplasm of upper-inner quadrant of left female breast: Secondary | ICD-10-CM

## 2017-05-25 DIAGNOSIS — E559 Vitamin D deficiency, unspecified: Secondary | ICD-10-CM

## 2017-05-25 DIAGNOSIS — Z79899 Other long term (current) drug therapy: Secondary | ICD-10-CM

## 2017-05-25 LAB — CBC WITH DIFFERENTIAL/PLATELET
BASOS PCT: 1 %
Basophils Absolute: 0 10*3/uL (ref 0.0–0.1)
Eosinophils Absolute: 0.1 10*3/uL (ref 0.0–0.7)
Eosinophils Relative: 1 %
HCT: 41.9 % (ref 36.0–46.0)
Hemoglobin: 13 g/dL (ref 12.0–15.0)
LYMPHS ABS: 1.9 10*3/uL (ref 0.7–4.0)
Lymphocytes Relative: 31 %
MCH: 28.9 pg (ref 26.0–34.0)
MCHC: 31 g/dL (ref 30.0–36.0)
MCV: 93.1 fL (ref 78.0–100.0)
MONOS PCT: 11 %
Monocytes Absolute: 0.7 10*3/uL (ref 0.1–1.0)
NEUTROS PCT: 56 %
Neutro Abs: 3.4 10*3/uL (ref 1.7–7.7)
Platelets: 254 10*3/uL (ref 150–400)
RBC: 4.5 MIL/uL (ref 3.87–5.11)
RDW: 13.9 % (ref 11.5–15.5)
WBC: 6.1 10*3/uL (ref 4.0–10.5)

## 2017-05-25 LAB — COMPREHENSIVE METABOLIC PANEL
ALBUMIN: 3.9 g/dL (ref 3.5–5.0)
ALT: 22 U/L (ref 14–54)
ANION GAP: 12 (ref 5–15)
AST: 25 U/L (ref 15–41)
Alkaline Phosphatase: 48 U/L (ref 38–126)
BUN: 13 mg/dL (ref 6–20)
CALCIUM: 9.3 mg/dL (ref 8.9–10.3)
CO2: 23 mmol/L (ref 22–32)
Chloride: 103 mmol/L (ref 101–111)
Creatinine, Ser: 0.89 mg/dL (ref 0.44–1.00)
GFR calc non Af Amer: >60 mL/min (ref >60)
GLUCOSE: 92 mg/dL (ref 65–99)
POTASSIUM: 4.3 mmol/L (ref 3.5–5.1)
SODIUM: 138 mmol/L (ref 135–145)
TOTAL PROTEIN: 7 g/dL (ref 6.5–8.1)
Total Bilirubin: 0.6 mg/dL (ref 0.3–1.2)

## 2017-05-25 MED ORDER — DENOSUMAB 60 MG/ML ~~LOC~~ SOLN
60.0000 mg | Freq: Once | SUBCUTANEOUS | Status: AC
  Administered 2017-05-25: 60 mg via SUBCUTANEOUS
  Filled 2017-05-25: qty 1

## 2017-05-25 NOTE — Progress Notes (Signed)
Stacie Huang presents today for injection per the provider's orders.  Prolia administration without incident; see MAR for injection details.  Patient tolerated procedure well and without incident.  No questions or complaints noted at this time.  Discharged ambulatory.  

## 2017-05-26 LAB — VITAMIN D 25 HYDROXY (VIT D DEFICIENCY, FRACTURES): VIT D 25 HYDROXY: 40.4 ng/mL (ref 30.0–100.0)

## 2017-05-28 ENCOUNTER — Ambulatory Visit (INDEPENDENT_AMBULATORY_CARE_PROVIDER_SITE_OTHER): Payer: Medicare Other | Admitting: Cardiology

## 2017-05-28 ENCOUNTER — Encounter: Payer: Self-pay | Admitting: Cardiology

## 2017-05-28 VITALS — BP 158/74 | HR 89 | Ht 61.0 in | Wt 147.0 lb

## 2017-05-28 DIAGNOSIS — I4891 Unspecified atrial fibrillation: Secondary | ICD-10-CM

## 2017-05-28 DIAGNOSIS — I10 Essential (primary) hypertension: Secondary | ICD-10-CM

## 2017-05-28 DIAGNOSIS — E782 Mixed hyperlipidemia: Secondary | ICD-10-CM | POA: Diagnosis not present

## 2017-05-28 NOTE — Patient Instructions (Signed)

## 2017-05-28 NOTE — Progress Notes (Signed)
Clinical Summary Stacie Huang is a 77 y.o.female seen today for follow up of the following medical problem.s   1. Afib  - not interested in NOACs - no palpitations - compliant with meds. No troubles with coumadin  2. HTN - she is compliant with meds  3. Hyperlipidemia - compliant with meds  4. Breast cancer - currently cancer free Past Medical History:  Diagnosis Date  . Anxiety   . Breast cancer ([place])   . Cancer ([place])   . Chronic anticoagulation   . GERD (gastroesophageal reflux disease)   . Hyperlipidemia   . Hypertension   . 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 Osteopenia determined by x-ray 10/03/2015  . Paroxysmal atrial fibrillation ([facility])      Allergies  Allergen Reactions  . Iohexol Hives  . Sulfa Antibiotics Rash     Current Outpatient Medications  Medication Sig Dispense Refill  . acetaminophen (TYLENOL) 500 MG tablet Take 1,000 mg by mouth daily.    Marland Kitchen anastrozole (ARIMIDEX) 1 MG tablet TAKE 1 TABLET BY MOUTH DAILY. 30 tablet 2  . atenolol (TENORMIN) 100 MG tablet Take 50 mg by mouth daily before breakfast.     . calcium carbonate (OS-CAL) 600 MG TABS tablet Take 600 mg by mouth 2 (two) times daily with a meal.     . cetirizine (ZYRTEC) 10 MG tablet Take 10 mg by mouth every morning.     . Cholecalciferol (VITAMIN D) 400 UNITS capsule Take 400 Units by mouth 3 (three) times daily.     Marland Kitchen denosumab (PROLIA) 60 MG/ML SOLN injection Inject 60 mg into the skin every 6 (six) months. Administer in upper arm, thigh, or abdomen    . diltiazem (CARDIZEM CD) 120 MG 24 hr capsule TAKE 1 CAPSULE BY MOUTH EVERY DAY. 90 capsule 3  . fluticasone (FLONASE) 50 MCG/ACT nasal spray Place 2 sprays into the nose at bedtime.     Marland Kitchen LORazepam (ATIVAN) 1 MG tablet Take 1 mg by mouth daily as needed for anxiety (AND/OR FOR SLEEP).     Marland Kitchen losartan (COZAAR) 50 MG tablet Take 50 mg by mouth every morning.     . metoCLOPramide (REGLAN) 10 MG tablet  TAKE 1 TABLET BY MOUTH EVERY 8 HOURS AS NEEDED FOR NAUSEA. 120 tablet 1  . metoCLOPramide (REGLAN) 10 MG tablet TAKE 1 TABLET BY MOUTH EVERY 8 HOURS AS NEEDED FOR NAUSEA. 120 tablet 0  . pantoprazole (PROTONIX) 40 MG tablet TAKE (1) TABLET BY MOUTH TWICE DAILY. 60 tablet 5  . psyllium (METAMUCIL) 58.6 % powder Take 1 packet by mouth daily.     . simvastatin (ZOCOR) 10 MG tablet Take 10 mg by mouth at bedtime.      . traMADol (ULTRAM) 50 MG tablet Take 1 tablet (50 mg total) every 6 (six) hours as needed by mouth. 15 tablet 0  . warfarin (COUMADIN) 2.5 MG tablet Take 1/2 tablet daily except 1 tablet on Fridays 30 tablet 3   No current facility-administered medications for this visit.      Past Surgical History:  Procedure Laterality Date  . ABDOMINAL HYSTERECTOMY    . AXILLARY LYMPH NODE DISSECTION Left 02/23/2013   Procedure: LEFT AXILLARY LYMPH NODE DISSECTION;  Surgeon: Scherry Ran, MD;  Location: [facility] ORS;  Service: General;  Laterality: Left;  . CHOLECYSTECTOMY    . COLONOSCOPY  03/10/2002   RMR: Incomplete colonoscopy (sigmoidoscopy)/Normal rectum/Normal colon to 40 cm.   . ESOPHAGOGASTRODUODENOSCOPY  03/10/2002   [id number]:[id number] esophagus/small HH/Tiny AVM in antrum, otherwise normal stomach and D1 and D2/Status post passage of the [facility] dilator  . PARTIAL MASTECTOMY WITH AXILLARY SENTINEL LYMPH NODE BIOPSY Left 02/23/2013   Procedure: LEFT PARTIAL MASTECTOMY WITH AXILLARY SIMPLE NODE BIOPSY, LEFT BREAST WIDE EXCISION;  Surgeon: Scherry Ran, MD;  Location: [facility] ORS;  Service: General;  Laterality: Left;  . PARTIAL MASTECTOMY WITH NEEDLE LOCALIZATION Right 04/02/2016   Procedure: RIGHT PARTIAL MASTECTOMY AFTER NEEDLE LOCALIZATION;  Surgeon: Aviva Signs, MD;  Location: [facility] ORS;  Service: General;  Laterality: Right;  . PORTACATH PLACEMENT Right 04/07/2013  . PORTACATH PLACEMENT Right 04/07/2013   Procedure: INSERTION PORT-A-CATH;  Surgeon: Scherry Ran, MD;  Location: [facility]  ORS;  Service: General;  Laterality: Right;     Allergies  Allergen Reactions  . Iohexol Hives  . Sulfa Antibiotics Rash      Family History  Problem Relation Age of Onset  . Heart disease Mother        Also 50 of 9 siblings  . Heart attack Father   . Leukemia Unknown   . Leukemia Unknown   . Colon cancer Neg Hx      Social History Stacie Huang reports that  has never smoked. she has never used smokeless tobacco. Stacie Huang reports that she does not drink alcohol.   Review of Systems CONSTITUTIONAL: No weight loss, fever, chills, weakness or fatigue.  HEENT: Eyes: No visual loss, blurred vision, double vision or yellow sclerae.No hearing loss, sneezing, congestion, runny nose or sore throat.  SKIN: No rash or itching.  CARDIOVASCULAR: per hpi RESPIRATORY: No shortness of breath, cough or sputum.  GASTROINTESTINAL: No anorexia, nausea, vomiting or diarrhea. No abdominal pain or blood.  GENITOURINARY: No burning on urination, no polyuria NEUROLOGICAL: No headache, dizziness, syncope, paralysis, ataxia, numbness or tingling in the extremities. No change in bowel or bladder control.  MUSCULOSKELETAL: No muscle, back pain, joint pain or stiffness.  LYMPHATICS: No enlarged nodes. No history of splenectomy.  PSYCHIATRIC: No history of depression or anxiety.  ENDOCRINOLOGIC: No reports of sweating, cold or heat intolerance. No polyuria or polydipsia.  Marland Kitchen   Physical Examination Vitals:   05/28/17 1414  BP: (!) 158/74  Pulse: 89  SpO2: 96%   Vitals:   05/28/17 1414  Weight: 147 lb (66.7 kg)  Height: 5\' 1"  (1.549 m)    Gen: resting comfortably, no acute distress HEENT: no scleral icterus, pupils equal round and reactive, no palptable cervical adenopathy,  CV: irreg, no mrg, no jvd Resp: Clear to auscultation bilaterally GI: abdomen is soft, non-tender, non-distended, normal bowel sounds, no hepatosplenomegaly MSK: extremities are warm, no edema.  Skin: warm, no  rash Neuro:  no focal deficits Psych: appropriate affect    Assessment and Plan   1. Afib - no symptoms, continue current meds - CHADS2Vasc score is 4, continue coumadin - ekg today in clinic today shows rate controlled afib  2. HTN - repeat manual bp 120/70, at goal. Continue current meds  3. Hyperlipidemia - request labs from pcp - continue current meds  F/u 1 year    Arnoldo Lenis, M.D.

## 2017-05-31 ENCOUNTER — Encounter: Payer: Self-pay | Admitting: Cardiology

## 2017-06-24 ENCOUNTER — Ambulatory Visit (INDEPENDENT_AMBULATORY_CARE_PROVIDER_SITE_OTHER): Payer: Medicare Other | Admitting: *Deleted

## 2017-06-24 DIAGNOSIS — Z5181 Encounter for therapeutic drug level monitoring: Secondary | ICD-10-CM

## 2017-06-24 DIAGNOSIS — I4891 Unspecified atrial fibrillation: Secondary | ICD-10-CM | POA: Diagnosis not present

## 2017-06-24 LAB — POCT INR: INR: 1.8

## 2017-06-24 NOTE — Patient Instructions (Signed)
Take coumadin 1 tablet tonight then increase dose to 1/2 tablet daily except 1 tablet on Tuesdays and Fridays  Recheck in 4 weeks.

## 2017-06-26 ENCOUNTER — Other Ambulatory Visit (HOSPITAL_COMMUNITY): Payer: Self-pay | Admitting: Oncology

## 2017-06-26 ENCOUNTER — Other Ambulatory Visit (HOSPITAL_COMMUNITY): Payer: Medicare Other

## 2017-06-26 ENCOUNTER — Encounter (HOSPITAL_COMMUNITY): Payer: Self-pay | Admitting: Internal Medicine

## 2017-06-26 ENCOUNTER — Inpatient Hospital Stay (HOSPITAL_COMMUNITY): Payer: Medicare Other | Attending: Internal Medicine | Admitting: Internal Medicine

## 2017-06-26 VITALS — BP 120/76 | HR 100 | Temp 97.6°F | Resp 18 | Wt 148.0 lb

## 2017-06-26 DIAGNOSIS — C50212 Malignant neoplasm of upper-inner quadrant of left female breast: Secondary | ICD-10-CM | POA: Insufficient documentation

## 2017-06-26 DIAGNOSIS — M8580 Other specified disorders of bone density and structure, unspecified site: Secondary | ICD-10-CM

## 2017-06-26 DIAGNOSIS — Z780 Asymptomatic menopausal state: Secondary | ICD-10-CM

## 2017-06-26 DIAGNOSIS — C50411 Malignant neoplasm of upper-outer quadrant of right female breast: Secondary | ICD-10-CM | POA: Insufficient documentation

## 2017-06-26 DIAGNOSIS — Z171 Estrogen receptor negative status [ER-]: Secondary | ICD-10-CM

## 2017-06-26 DIAGNOSIS — I10 Essential (primary) hypertension: Secondary | ICD-10-CM | POA: Insufficient documentation

## 2017-06-26 DIAGNOSIS — Z170 Estrogen receptor positive status [ER+]: Secondary | ICD-10-CM

## 2017-06-26 DIAGNOSIS — C50911 Malignant neoplasm of unspecified site of right female breast: Secondary | ICD-10-CM

## 2017-06-26 DIAGNOSIS — Z79811 Long term (current) use of aromatase inhibitors: Secondary | ICD-10-CM

## 2017-06-26 MED ORDER — SODIUM CHLORIDE 0.9% FLUSH
10.0000 mL | Freq: Once | INTRAVENOUS | Status: AC
  Administered 2017-06-26: 10 mL via INTRAVENOUS

## 2017-06-26 MED ORDER — HEPARIN SOD (PORK) LOCK FLUSH 100 UNIT/ML IV SOLN
500.0000 [IU] | Freq: Once | INTRAVENOUS | Status: AC
  Administered 2017-06-26: 500 [IU] via INTRAVENOUS

## 2017-06-26 NOTE — Progress Notes (Signed)
Port flushed per protocol.  Port site clean and dry with no bruising or swelling noted at site.  No complaints of pain with flush.  Band aid applied.  VSS with discharge and left ambulatory with no s/s of distress noted.

## 2017-06-26 NOTE — Patient Instructions (Signed)
[place] at [facility] Discharge Instructions   You were seen today by Dr. Zoila Shutter Your injection will be scheduled in July with lab work Mammogram and bone density will be scheduled in October Follow up after tests for results We will refer you to general surgeons here in town to have your port a cath removed Continue taking your calcium and vitamin D as prescribed Talk to your primary care physician about the spots on your breast/chest   Thank you for choosing [place] at [facility] to provide your oncology and hematology care.  To afford each patient quality time with our provider, please arrive at least 15 minutes before your scheduled appointment time.    If you have a lab appointment with the [facility] please come in thru the  Main Entrance and check in at the main information desk  You need to re-schedule your appointment should you arrive 10 or more minutes late.  We strive to give you quality time with our providers, and arriving late affects you and other patients whose appointments are after yours.  Also, if you no show three or more times for appointments you may be dismissed from the clinic at the providers discretion.     Again, thank you for choosing [facility].  Our hope is that these requests will decrease the amount of time that you wait before being seen by our physicians.       _____________________________________________________________  Should you have questions after your visit to [facility], please contact our office at [phone] between the hours of 8:30 a.m. and 4:30 p.m.  Voicemails left after 4:30 p.m. will not be returned until the following business day.  For prescription refill requests, have your pharmacy contact our office.       Resources For Cancer Patients and their Caregivers ? American Cancer Society: Can assist with transportation, wigs, general needs,  runs Look Good Feel Better.        670-543-2290 ? Cancer Care: Provides financial assistance, online support groups, medication/co-pay assistance.  1-800-813-HOPE 754-050-3790) ? [place] Assists [place] cancer patients and their families through emotional , educational and financial support.  (234)288-2407 ? [place] DSS Where to apply for food stamps, Medicaid and utility assistance. 867-220-8987 ? [facility]: Transportation to medical appointments. 469-590-9667 ? Social Security Administration: May apply for disability if have a Stage IV cancer. (209)643-1786 930 305 6754 ? [employer], Disability and Transit Services: Assists with nutrition, care and transit needs. [place] Support Programs:   > Cancer Support Group  2nd Tuesday of the month 1pm-2pm, Journey Room   > Creative Journey  3rd Tuesday of the month 1130am-1pm, Journey Room

## 2017-07-13 ENCOUNTER — Other Ambulatory Visit (HOSPITAL_COMMUNITY): Payer: Self-pay | Admitting: Adult Health

## 2017-07-14 ENCOUNTER — Other Ambulatory Visit (HOSPITAL_COMMUNITY): Payer: Self-pay | Admitting: Adult Health

## 2017-07-19 NOTE — Progress Notes (Signed)
Diagnosis Invasive ductal carcinoma of breast, female, right ([place]) - Plan: heparin lock flush 100 unit/mL, sodium chloride flush (NS) 0.9 % injection 10 mL  Malignant neoplasm of upper-inner quadrant of left breast in female, estrogen receptor positive ([place]) - Plan: MM Digital Diagnostic Bilat, heparin lock flush 100 unit/mL, sodium chloride flush (NS) 0.9 % injection 10 mL  Staging Cancer Staging Breast cancer of upper-inner quadrant of left female breast [facility]) Staging form: Breast, AJCC 7th Edition - Clinical stage from 03/16/2013: Stage IA (T1b, N0, cM0, Free text: stage I) - Unsigned - Pathologic: Stage IA (T1b, N0, cM0) - Signed by Farrel Gobble, MD on 03/17/2013  Invasive ductal carcinoma of breast, female, right [facility]) Staging form: Breast, AJCC 8th Edition - Pathologic stage from 05/08/2016: Stage IIA (pT1b, pN0, cM0, G2, ER: Negative, PR: Negative, HER2: Negative) - Signed by Baird Cancer, PA-C on 05/08/2016   Assessment and Plan: 1.  Stage IIA (pT1b, pN0, cM0, G2, ER: Negative, PR: Negative, HER2: Negative).  Pt was followed by Dr. Talbert Cage.  She has been on Arimidex since 06/2013.  She had bilateral diagnostic mammogram done 01/2017 that was negative and she is due to repeat imaging in 01/2018.  This is ordered and she will RTC at that time to go over results.  She is advised to notify the office if she has any problems prior to next visit.    2.  Osteopenia.  This was noted on bone density done 07/2015.  She remains on Prolia every 6 months.  She remains on calcium and vitamin D.  She should have repeat BMD in 2019.    3.  Hypertension.  BP is 120/76.  Continue to follow-up with PCP.    4.  PAC removal.  Pt desires to have PAC removed.  She is referred to surgery.    Interval History:  77 y.o. female followed by Dr. Talbert Cage for  B/L breast cancer.  Both breast cancers were stage I.  Left breast cancer diagnosed in 01/2013 and was ER/PR positive and HER-2  positive.  Right breast  cancer was diagnosed in 02/2016 and was triple negative.  She is tolerating Arimidex which began 06/2013.    Current Status:  Pt is seen today for follow-up.  She would like to have Port removed.     Breast cancer of upper-inner quadrant of left female breast ([place])   02/04/2013 Initial Diagnosis    Breast cancer of upper-inner quadrant of left female breast      02/23/2013 Surgery    left lumpectomy (UIQ), sentinel node biopsy--0.7cm, Node negative, ER+, PR+, HER-2/neu over expressed.      04/08/2013 - 04/14/2013 Chemotherapy    Paclitaxel/Herceptin weekly x 12. Intolerance to Paclitaxel and only receiving 2 cycles      05/04/2013 - 04/05/2014 Chemotherapy    Herceptin every 21 days       06/28/2013 -  Chemotherapy    Anastrazole started      08/02/2013 Imaging    Bone density- normal      04/06/2014 Imaging    MUGA- Left ventricular ejection fraction equals 53%.      08/06/2015 Imaging    Bone density- osteopenia      03/06/2016 Procedure    Stereotactic-guided biopsy of right breast upper outer quadrant focal asymmetry/calcifications. No apparent complications.      03/06/2016 Procedure    Successful placement of coil shaped marker within the right breast upper outer quadrant biopsy site, post stereotactic core needle biopsy.  Invasive ductal carcinoma of breast, female, right ([place])   02/26/2016 Mammogram    1. Focal asymmetry within the upper-outer quadrant of the right breast, with associated new calcifications. This is a suspicious finding for which stereotactic biopsy with 3D tomosynthesis guidance is recommended. 2. Cluster of cysts at the 10 o'clock axis, 5 cm from the nipple, measuring 1.2 x 0.6 x 1.1 cm, a possible correlate for the mammographic asymmetry.      03/06/2016 Procedure    Right needle core biopsy      03/07/2016 Pathology Results    Breast, right, needle core biopsy, upper outer quadrant - HIGH GRADE DUCTAL CARCINOMA IN SITU WITH  NECROSIS AND ASSOCIATED CALCIFICATIONS.IMMUNOHISTOCHEMICAL AND MORPHOMETRIC ANALYSIS PERFORMED MANUALLY Estrogen Receptor: 0%, NEGATIVE Progesterone Receptor: 0%, NEGATIVE      04/02/2016 Procedure    Breast, lumpectomy, right by Dr. Arnoldo Morale      04/07/2016 Pathology Results    INVASIVE DUCTAL CARCINOMA, GRADE 2, SPANNING 0.6 CM THE CARCINOMA IS FOCALLY PRESENTED AT THE CAUTERIZED MEDIAL MARGIN EXTENSIVE DUCTAL CARCINOMA IN SITU, GRADE 3 ALL OTHER SURGICAL MARGIN ARE NEGATIVE FOR CARCINOMA ER 0% PR 0% Ki-67 15% HER2 NEGATIVE      05/05/2016 Pathology Results    MammaPrint: LOW RISK (97.8% of low risk Mammaprint patients who were treated with anti-hormonal therapy alone are living without distant recurrence of breast cancer at 5-years).      06/02/2016 - 07/04/2016 Radiation Therapy    XRT in [place], [place].  Right breast 42.56 Gy delivered in 16 fractions at 2.65 Gy per fraction and a right tumor bed boost 16 Gy delivered in 8 fractions at 2 Gy per fraction for a total dose of 58.56 Gy completed on 07/04/2016.        Problem List Patient Active Problem List   Diagnosis Date Noted  . Invasive ductal carcinoma of breast, female, right ([place]) [C50.911] 05/08/2016  . High risk medication use [Z79.899] 10/03/2015  . Osteopenia determined by x-ray [M85.80] 10/03/2015  . GERD (gastroesophageal reflux disease) [K21.9] 08/11/2013  . Bloating [R14.0] 08/11/2013  . Encounter for therapeutic drug monitoring [Z51.81] 06/17/2013  . Paroxysmal atrial fibrillation ([place]) [I48.0] 03/17/2013  . Vitamin D deficiency [E55.9] 03/16/2013  . Breast cancer of upper-inner quadrant of left female breast ([place]) [C50.212] 03/16/2013  . Atrial fibrillation ([place]) [I48.91]   . Hypertension [I10]   . Chronic anticoagulation [Z79.01] 11/11/2010  . Hyperlipidemia [E78.5] 02/26/2010    Past Medical History Past Medical History:  Diagnosis Date  . Anxiety   . Breast cancer ([place])   . Cancer ([place])   . Chronic  anticoagulation   . GERD (gastroesophageal reflux disease)   . Hyperlipidemia   . Hypertension   . Invasive ductal carcinoma of breast, female, right ([place]) 05/08/2016  . Osteopenia determined by x-ray 10/03/2015  . Paroxysmal atrial fibrillation [facility])     Past Surgical History Past Surgical History:  Procedure Laterality Date  . ABDOMINAL HYSTERECTOMY    . AXILLARY LYMPH NODE DISSECTION Left 02/23/2013   Procedure: LEFT AXILLARY LYMPH NODE DISSECTION;  Surgeon: Scherry Ran, MD;  Location: [facility];  Service: General;  Laterality: Left;  . CHOLECYSTECTOMY    . COLONOSCOPY  03/10/2002   RMR: Incomplete colonoscopy (sigmoidoscopy)/Normal rectum/Normal colon to 40 cm.   . ESOPHAGOGASTRODUODENOSCOPY  03/10/2002   QBH:ALPFXT esophagus/small HH/Tiny AVM in antrum, otherwise normal stomach and D1 and D2/Status post passage of the [facility] dilator  . PARTIAL MASTECTOMY WITH AXILLARY SENTINEL LYMPH NODE BIOPSY Left 02/23/2013  Procedure: LEFT PARTIAL MASTECTOMY WITH AXILLARY SIMPLE NODE BIOPSY, LEFT BREAST WIDE EXCISION;  Surgeon: Scherry Ran, MD;  Location: [facility]S;  Service: General;  Laterality: Left;  . PARTIAL MASTECTOMY WITH NEEDLE LOCALIZATION Right 04/02/2016   Procedure: RIGHT PARTIAL MASTECTOMY AFTER NEEDLE LOCALIZATION;  Surgeon: Aviva Signs, MD;  Location: [facility];  Service: General;  Laterality: Right;  . PORTACATH PLACEMENT Right 04/07/2013  . PORTACATH PLACEMENT Right 04/07/2013   Procedure: INSERTION PORT-A-CATH;  Surgeon: Scherry Ran, MD;  Location: [facility];  Service: General;  Laterality: Right;    Family History Family History  Problem Relation Age of Onset  . Heart disease Mother        Also 39 of 9 siblings  . Heart attack Father   . Leukemia Unknown   . Leukemia Unknown   . Colon cancer Neg Hx      Social History  reports that she has never smoked. She has never used smokeless tobacco. She reports that she does not drink alcohol or use  drugs.  Medications  Current Outpatient Medications:  .  acetaminophen (TYLENOL) 500 MG tablet, Take 1,000 mg by mouth daily., Disp: , Rfl:  .  anastrozole (ARIMIDEX) 1 MG tablet, TAKE 1 TABLET BY MOUTH DAILY., Disp: 30 tablet, Rfl: 2 .  atenolol (TENORMIN) 100 MG tablet, Take 50 mg by mouth daily before breakfast. , Disp: , Rfl:  .  calcium carbonate (OS-CAL) 600 MG TABS tablet, Take 600 mg by mouth 2 (two) times daily with a meal. , Disp: , Rfl:  .  cetirizine (ZYRTEC) 10 MG tablet, Take 10 mg by mouth every morning. , Disp: , Rfl:  .  Cholecalciferol (VITAMIN D) 400 UNITS capsule, Take 400 Units by mouth 3 (three) times daily. , Disp: , Rfl:  .  denosumab (PROLIA) 60 MG/ML SOLN injection, Inject 60 mg into the skin every 6 (six) months. Administer in upper arm, thigh, or abdomen, Disp: , Rfl:  .  diltiazem (CARDIZEM CD) 120 MG 24 hr capsule, TAKE 1 CAPSULE BY MOUTH EVERY DAY., Disp: 90 capsule, Rfl: 3 .  fluticasone (FLONASE) 50 MCG/ACT nasal spray, Place 2 sprays into the nose at bedtime. , Disp: , Rfl:  .  LORazepam (ATIVAN) 1 MG tablet, Take 1 mg by mouth daily as needed for anxiety (AND/OR FOR SLEEP). , Disp: , Rfl:  .  losartan (COZAAR) 50 MG tablet, Take 50 mg by mouth every morning. , Disp: , Rfl:  .  metoCLOPramide (REGLAN) 10 MG tablet, TAKE 1 TABLET BY MOUTH EVERY 8 HOURS AS NEEDED FOR NAUSEA., Disp: 120 tablet, Rfl: 1 .  metoCLOPramide (REGLAN) 10 MG tablet, TAKE 1 TABLET BY MOUTH EVERY 8 HOURS AS NEEDED FOR NAUSEA., Disp: 120 tablet, Rfl: 0 .  pantoprazole (PROTONIX) 40 MG tablet, TAKE (1) TABLET BY MOUTH TWICE DAILY., Disp: 60 tablet, Rfl: 5 .  psyllium (METAMUCIL) 58.6 % powder, Take 1 packet by mouth daily. , Disp: , Rfl:  .  simvastatin (ZOCOR) 10 MG tablet, Take 10 mg by mouth at bedtime.  , Disp: , Rfl:  .  traMADol (ULTRAM) 50 MG tablet, Take 1 tablet (50 mg total) every 6 (six) hours as needed by mouth., Disp: 15 tablet, Rfl: 0 .  warfarin (COUMADIN) 2.5 MG tablet, Take  1/2 tablet daily except 1 tablet on Fridays, Disp: 30 tablet, Rfl: 3 .  metoCLOPramide (REGLAN) 10 MG tablet, TAKE 1 TABLET BY MOUTH EVERY 8 HOURS AS NEEDED FOR NAUSEA., Disp: 120 tablet, Rfl:  0 .  metoCLOPramide (REGLAN) 10 MG tablet, TAKE 1 TABLET BY MOUTH EVERY 8 HOURS AS NEEDED FOR NAUSEA., Disp: 120 tablet, Rfl: 1  Allergies Iohexol and Sulfa antibiotics  Review of Systems Review of Systems - Oncology ROS as per HPI otherwise 12 point ROS is negative.   Physical Exam  Vitals Wt Readings from Last 3 Encounters:  06/26/17 148 lb (67.1 kg)  05/28/17 147 lb (66.7 kg)  03/05/17 145 lb (65.8 kg)   Temp Readings from Last 3 Encounters:  06/26/17 97.6 F (36.4 C) (Oral)  05/25/17 98.2 F (36.8 C) (Oral)  03/05/17 (!) 97.5 F (36.4 C) (Oral)   BP Readings from Last 3 Encounters:  06/26/17 120/76  05/28/17 (!) 158/74  05/25/17 138/81   Pulse Readings from Last 3 Encounters:  06/26/17 100  05/28/17 89  05/25/17 93   Constitutional: Well-developed, well-nourished, and in no distress.   HENT: Head: Normocephalic and atraumatic.  Mouth/Throat: No oropharyngeal exudate. Mucosa moist. Eyes: Pupils are equal, round, and reactive to light. Conjunctivae are normal. No scleral icterus.  Neck: Normal range of motion. Neck supple. No JVD present.  Cardiovascular: Normal rate, regular rhythm and normal heart sounds.  Exam reveals no gallop and no friction rub.   No murmur heard. Pulmonary/Chest: Effort normal and breath sounds normal. No respiratory distress. No wheezes.No rales.  Abdominal: Soft. Bowel sounds are normal. No distension. There is no tenderness. There is no guarding.  Musculoskeletal: No edema or tenderness.  Lymphadenopathy: No cervical, axillary or supraclavicular adenopathy.  Neurological: Alert and oriented to person, place, and time. No cranial nerve deficit.  Skin: Skin is warm and dry. No rash noted. No erythema. No pallor.  Psychiatric: Affect and judgment  normal.  Bilateral breast exam.  Chaperone present.  Bilateral lumpectomy.  No palpable masses appreciated.    Labs Anti-coag visit on 06/24/2017  Component Date Value Ref Range Status  . INR 06/24/2017 1.8   Final     Pathology Orders Placed This Encounter  Procedures  . MM Digital Diagnostic Bilat    Standing Status:   Future    Standing Expiration Date:   06/26/2018    Order Specific Question:   Reason for Exam (SYMPTOM  OR DIAGNOSIS REQUIRED)    Answer:   Left breast cancer    Order Specific Question:   Preferred imaging location?    Answer:   [facility]       Zoila Shutter MD

## 2017-07-21 ENCOUNTER — Encounter: Payer: Self-pay | Admitting: General Surgery

## 2017-07-21 ENCOUNTER — Ambulatory Visit (INDEPENDENT_AMBULATORY_CARE_PROVIDER_SITE_OTHER): Payer: Medicare Other | Admitting: General Surgery

## 2017-07-21 VITALS — BP 128/72 | HR 97 | Temp 98.6°F | Ht 61.0 in | Wt 148.0 lb

## 2017-07-21 DIAGNOSIS — C50911 Malignant neoplasm of unspecified site of right female breast: Secondary | ICD-10-CM

## 2017-07-21 NOTE — Patient Instructions (Addendum)
Stop coumadin on Saturday, 07/27/17  Implanted Port Removal Implanted port removal is a procedure to remove the port and catheter (port-a-cath) that is implanted under your skin. The port is a small disc under your skin that can be punctured with a needle. It is connected to a vein in your chest or neck by a small flexible tube (catheter). The port-a-cath is used for treatment through an IV tube and for taking blood samples. Your health care provider will remove the port-a-cath if:  You no longer need it for treatment.  It is not working properly.  The area around it gets infected.  Tell a health care provider about:  Any allergies you have.  All medicines you are taking, including vitamins, herbs, eye drops, creams, and over-the-counter medicines.  Any problems you or family members have had with anesthetic medicines.  Any blood disorders you have.  Any surgeries you have had.  Any medical conditions you have.  Whether you are pregnant or may be pregnant. What are the risks? Generally, this is a safe procedure. However, problems may occur, including:  Infection.  Bleeding.  Allergic reactions to anesthetic medicines.  Damage to nerves or blood vessels.  What happens before the procedure?  You will have: ? A physical exam. ? Blood tests. ? Imaging tests, including a chest X-ray.  Follow instructions from your health care provider about eating or drinking restrictions.  Ask your health care provider about: ? Changing or stopping your regular medicines. This is especially important if you are taking diabetes medicines or blood thinners. ? Taking medicines such as aspirin and ibuprofen. These medicines can thin your blood. Do not take these medicines before your procedure if your surgeon instructs you not to.  Ask your health care provider how your surgical site will be marked or identified.  You may be given antibiotic medicine to help prevent infection.  Plan to  have someone take you home after the procedure.  If you will be going home right after the procedure, plan to have someone stay with you for 24 hours. What happens during the procedure?  To reduce your risk of infection: ? Your health care team will wash or sanitize their hands. ? Your skin will be washed with soap.  You may be given one or more of the following: ? A medicine to help you relax (sedative). ? A medicine to numb the area (local anesthetic).  A small cut (incision) will be made at the site of your port-a-cath.  The port-a-cath and the catheter that has been inside your vein will gently be removed.  The incision will be closed with stitches (sutures), adhesive strips, or skin glue.  A bandage (dressing) will be placed over the incision. The procedure may vary among health care providers and hospitals. What happens after the procedure?  Your blood pressure, heart rate, breathing rate, and blood oxygen level will be monitored often until the medicines you were given have worn off.  Do not drive for 24 hours if you received a sedative. This information is not intended to replace advice given to you by your health care provider. Make sure you discuss any questions you have with your health care provider. Document Released: 03/26/2015 Document Revised: 09/20/2015 Document Reviewed: 01/17/2015 Elsevier Interactive Patient Education  [employer].

## 2017-07-21 NOTE — H&P (Signed)
Stacie Huang; [id number]; September 30, 1940   HPI Patient is a 77 year old white female who was referred to my care by oncology for removal of her Port-A-Cath.  She has finished with her chemotherapy for right breast cancer and needs the Port-A-Cath removed.  She has been on Coumadin for atrial fibrillation.  She currently has no pain. Past Medical History:  Diagnosis Date  . Anxiety   . Breast cancer ([place])   . Cancer ([place])   . Chronic anticoagulation   . GERD (gastroesophageal reflux disease)   . Hyperlipidemia   . Hypertension   . Invasive ductal carcinoma of breast, female, right ([place]) 05/08/2016  . Osteopenia determined by x-ray 10/03/2015  . Paroxysmal atrial fibrillation [facility])     Past Surgical History:  Procedure Laterality Date  . ABDOMINAL HYSTERECTOMY    . AXILLARY LYMPH NODE DISSECTION Left 02/23/2013   Procedure: LEFT AXILLARY LYMPH NODE DISSECTION;  Surgeon: Scherry Ran, MD;  Location: [facility];  Service: General;  Laterality: Left;  . CHOLECYSTECTOMY    . COLONOSCOPY  03/10/2002   RMR: Incomplete colonoscopy (sigmoidoscopy)/Normal rectum/Normal colon to 40 cm.   . ESOPHAGOGASTRODUODENOSCOPY  03/10/2002   QIW:LNLGXQ esophagus/small HH/Tiny AVM in antrum, otherwise normal stomach and D1 and D2/Status post passage of the [facility] dilator  . PARTIAL MASTECTOMY WITH AXILLARY SENTINEL LYMPH NODE BIOPSY Left 02/23/2013   Procedure: LEFT PARTIAL MASTECTOMY WITH AXILLARY SIMPLE NODE BIOPSY, LEFT BREAST WIDE EXCISION;  Surgeon: Scherry Ran, MD;  Location: [facility];  Service: General;  Laterality: Left;  . PARTIAL MASTECTOMY WITH NEEDLE LOCALIZATION Right 04/02/2016   Procedure: RIGHT PARTIAL MASTECTOMY AFTER NEEDLE LOCALIZATION;  Surgeon: Aviva Signs, MD;  Location: [facility];  Service: General;  Laterality: Right;  . PORTACATH PLACEMENT Right 04/07/2013  . PORTACATH PLACEMENT Right 04/07/2013   Procedure: INSERTION PORT-A-CATH;  Surgeon: Scherry Ran, MD;  Location: [facility];   Service: General;  Laterality: Right;    Family History  Problem Relation Age of Onset  . Heart disease Mother        Also 48 of 9 siblings  . Heart attack Father   . Leukemia Unknown   . Leukemia Unknown   . Colon cancer Neg Hx     Current Outpatient Medications on File Prior to Visit  Medication Sig Dispense Refill  . acetaminophen (TYLENOL) 500 MG tablet Take 1,000 mg by mouth daily.    Marland Kitchen anastrozole (ARIMIDEX) 1 MG tablet TAKE 1 TABLET BY MOUTH DAILY. 30 tablet 2  . atenolol (TENORMIN) 100 MG tablet Take 50 mg by mouth daily before breakfast.     . calcium carbonate (OS-CAL) 600 MG TABS tablet Take 600 mg by mouth 2 (two) times daily with a meal.     . cetirizine (ZYRTEC) 10 MG tablet Take 10 mg by mouth every morning.     . Cholecalciferol (VITAMIN D) 400 UNITS capsule Take 400 Units by mouth 3 (three) times daily.     Marland Kitchen denosumab (PROLIA) 60 MG/ML SOLN injection Inject 60 mg into the skin every 6 (six) months. Administer in upper arm, thigh, or abdomen    . diltiazem (CARDIZEM CD) 120 MG 24 hr capsule TAKE 1 CAPSULE BY MOUTH EVERY DAY. 90 capsule 3  . fluticasone (FLONASE) 50 MCG/ACT nasal spray Place 2 sprays into the nose at bedtime.     Marland Kitchen LORazepam (ATIVAN) 1 MG tablet Take 1 mg by mouth daily as needed for anxiety (AND/OR FOR SLEEP).     Marland Kitchen losartan (COZAAR)  50 MG tablet Take 50 mg by mouth every morning.     . metoCLOPramide (REGLAN) 10 MG tablet TAKE 1 TABLET BY MOUTH EVERY 8 HOURS AS NEEDED FOR NAUSEA. 120 tablet 1  . metoCLOPramide (REGLAN) 10 MG tablet TAKE 1 TABLET BY MOUTH EVERY 8 HOURS AS NEEDED FOR NAUSEA. 120 tablet 0  . metoCLOPramide (REGLAN) 10 MG tablet TAKE 1 TABLET BY MOUTH EVERY 8 HOURS AS NEEDED FOR NAUSEA. 120 tablet 0  . metoCLOPramide (REGLAN) 10 MG tablet TAKE 1 TABLET BY MOUTH EVERY 8 HOURS AS NEEDED FOR NAUSEA. 120 tablet 1  . pantoprazole (PROTONIX) 40 MG tablet TAKE (1) TABLET BY MOUTH TWICE DAILY. 60 tablet 5  . psyllium (METAMUCIL) 58.6 % powder Take  1 packet by mouth daily.     . simvastatin (ZOCOR) 10 MG tablet Take 10 mg by mouth at bedtime.      . traMADol (ULTRAM) 50 MG tablet Take 1 tablet (50 mg total) every 6 (six) hours as needed by mouth. 15 tablet 0  . warfarin (COUMADIN) 2.5 MG tablet Take 1/2 tablet daily except 1 tablet on Fridays 30 tablet 3   No current facility-administered medications on file prior to visit.     Allergies  Allergen Reactions  . Iohexol Hives  . Sulfa Antibiotics Rash    Social History   Substance and Sexual Activity  Alcohol Use No    Social History   Tobacco Use  Smoking Status Never Smoker  Smokeless Tobacco Never Used    Review of Systems  Constitutional: Negative.   HENT: Negative.   Eyes: Negative.   Respiratory: Positive for cough.   Cardiovascular: Negative.   Gastrointestinal: Negative.   Genitourinary: Negative.   Musculoskeletal: Negative.   Skin: Negative.   Neurological: Negative.   Endo/Heme/Allergies: Negative.   Psychiatric/Behavioral: Negative.     Objective   Vitals:   07/21/17 1350  BP: 128/72  Pulse: 97  Temp: 98.6 F (37 C)    Physical Exam  Constitutional: She is oriented to person, place, and time and well-developed, well-nourished, and in no distress.  HENT:  Head: Normocephalic and atraumatic.  Cardiovascular: Exam reveals no gallop and no friction rub.  No murmur heard. Irregularly irregular rhythm.  Pulmonary/Chest: Effort normal and breath sounds normal. No respiratory distress. She has no wheezes. She has no rales.  Port-A-Cath in place right upper chest.  Neurological: She is alert and oriented to person, place, and time.  Skin: Skin is warm and dry.  Vitals reviewed.  Oncology notes reviewed. Assessment  Right breast cancer, finished with chemotherapy Plan   Patient is scheduled for Port-A-Cath removal on 07/31/2017.  The risks and benefits of the procedure were fully explained to the patient, who gave informed consent.  She is to  stop her Coumadin on 07/27/2017.

## 2017-07-21 NOTE — Progress Notes (Signed)
Stacie Huang; [id number]; January 14, 1941   HPI Patient is a 77 year old white female who was referred to my care by oncology for removal of her Port-A-Cath.  She has finished with her chemotherapy for right breast cancer and needs the Port-A-Cath removed.  She has been on Coumadin for atrial fibrillation.  She currently has no pain. Past Medical History:  Diagnosis Date  . Anxiety   . Breast cancer ([place])   . Cancer ([place])   . Chronic anticoagulation   . GERD (gastroesophageal reflux disease)   . Hyperlipidemia   . Hypertension   . 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 Osteopenia determined by x-ray 10/03/2015  . Paroxysmal atrial fibrillation [facility])     Past Surgical History:  Procedure Laterality Date  . ABDOMINAL HYSTERECTOMY    . AXILLARY LYMPH NODE DISSECTION Left 02/23/2013   Procedure: LEFT AXILLARY LYMPH NODE DISSECTION;  Surgeon: Scherry Ran, MD;  Location: [facility];  Service: General;  Laterality: Left;  . CHOLECYSTECTOMY    . COLONOSCOPY  03/10/2002   RMR: Incomplete colonoscopy (sigmoidoscopy)/Normal rectum/Normal colon to 40 cm.   . ESOPHAGOGASTRODUODENOSCOPY  03/10/2002   PNT:IRWERX esophagus/small HH/Tiny AVM in antrum, otherwise normal stomach and D1 and D2/Status post passage of the [facility] dilator  . PARTIAL MASTECTOMY WITH AXILLARY SENTINEL LYMPH NODE BIOPSY Left 02/23/2013   Procedure: LEFT PARTIAL MASTECTOMY WITH AXILLARY SIMPLE NODE BIOPSY, LEFT BREAST WIDE EXCISION;  Surgeon: Scherry Ran, MD;  Location: [facility];  Service: General;  Laterality: Left;  . PARTIAL MASTECTOMY WITH NEEDLE LOCALIZATION Right 04/02/2016   Procedure: RIGHT PARTIAL MASTECTOMY AFTER NEEDLE LOCALIZATION;  Surgeon: Aviva Signs, MD;  Location: [facility];  Service: General;  Laterality: Right;  . PORTACATH PLACEMENT Right 04/07/2013  . PORTACATH PLACEMENT Right 04/07/2013   Procedure: INSERTION PORT-A-CATH;  Surgeon: Scherry Ran, MD;  Location: [facility];   Service: General;  Laterality: Right;    Family History  Problem Relation Age of Onset  . Heart disease Mother        Also 73 of 9 siblings  . Heart attack Father   . Leukemia Unknown   . Leukemia Unknown   . Colon cancer Neg Hx     Current Outpatient Medications on File Prior to Visit  Medication Sig Dispense Refill  . acetaminophen (TYLENOL) 500 MG tablet Take 1,000 mg by mouth daily.    Marland Kitchen anastrozole (ARIMIDEX) 1 MG tablet TAKE 1 TABLET BY MOUTH DAILY. 30 tablet 2  . atenolol (TENORMIN) 100 MG tablet Take 50 mg by mouth daily before breakfast.     . calcium carbonate (OS-CAL) 600 MG TABS tablet Take 600 mg by mouth 2 (two) times daily with a meal.     . cetirizine (ZYRTEC) 10 MG tablet Take 10 mg by mouth every morning.     . Cholecalciferol (VITAMIN D) 400 UNITS capsule Take 400 Units by mouth 3 (three) times daily.     Marland Kitchen denosumab (PROLIA) 60 MG/ML SOLN injection Inject 60 mg into the skin every 6 (six) months. Administer in upper arm, thigh, or abdomen    . diltiazem (CARDIZEM CD) 120 MG 24 hr capsule TAKE 1 CAPSULE BY MOUTH EVERY DAY. 90 capsule 3  . fluticasone (FLONASE) 50 MCG/ACT nasal spray Place 2 sprays into the nose at bedtime.     Marland Kitchen LORazepam (ATIVAN) 1 MG tablet Take 1 mg by mouth daily as needed for anxiety (AND/OR FOR SLEEP).     Marland Kitchen losartan (COZAAR)  50 MG tablet Take 50 mg by mouth every morning.     . metoCLOPramide (REGLAN) 10 MG tablet TAKE 1 TABLET BY MOUTH EVERY 8 HOURS AS NEEDED FOR NAUSEA. 120 tablet 1  . metoCLOPramide (REGLAN) 10 MG tablet TAKE 1 TABLET BY MOUTH EVERY 8 HOURS AS NEEDED FOR NAUSEA. 120 tablet 0  . metoCLOPramide (REGLAN) 10 MG tablet TAKE 1 TABLET BY MOUTH EVERY 8 HOURS AS NEEDED FOR NAUSEA. 120 tablet 0  . metoCLOPramide (REGLAN) 10 MG tablet TAKE 1 TABLET BY MOUTH EVERY 8 HOURS AS NEEDED FOR NAUSEA. 120 tablet 1  . pantoprazole (PROTONIX) 40 MG tablet TAKE (1) TABLET BY MOUTH TWICE DAILY. 60 tablet 5  . psyllium (METAMUCIL) 58.6 % powder Take  1 packet by mouth daily.     . simvastatin (ZOCOR) 10 MG tablet Take 10 mg by mouth at bedtime.      . traMADol (ULTRAM) 50 MG tablet Take 1 tablet (50 mg total) every 6 (six) hours as needed by mouth. 15 tablet 0  . warfarin (COUMADIN) 2.5 MG tablet Take 1/2 tablet daily except 1 tablet on Fridays 30 tablet 3   No current facility-administered medications on file prior to visit.     Allergies  Allergen Reactions  . Iohexol Hives  . Sulfa Antibiotics Rash    Social History   Substance and Sexual Activity  Alcohol Use No    Social History   Tobacco Use  Smoking Status Never Smoker  Smokeless Tobacco Never Used    Review of Systems  Constitutional: Negative.   HENT: Negative.   Eyes: Negative.   Respiratory: Positive for cough.   Cardiovascular: Negative.   Gastrointestinal: Negative.   Genitourinary: Negative.   Musculoskeletal: Negative.   Skin: Negative.   Neurological: Negative.   Endo/Heme/Allergies: Negative.   Psychiatric/Behavioral: Negative.     Objective   Vitals:   07/21/17 1350  BP: 128/72  Pulse: 97  Temp: 98.6 F (37 C)    Physical Exam  Constitutional: She is oriented to person, place, and time and well-developed, well-nourished, and in no distress.  HENT:  Head: Normocephalic and atraumatic.  Cardiovascular: Exam reveals no gallop and no friction rub.  No murmur heard. Irregularly irregular rhythm.  Pulmonary/Chest: Effort normal and breath sounds normal. No respiratory distress. She has no wheezes. She has no rales.  Port-A-Cath in place right upper chest.  Neurological: She is alert and oriented to person, place, and time.  Skin: Skin is warm and dry.  Vitals reviewed.  Oncology notes reviewed. Assessment  Right breast cancer, finished with chemotherapy Plan   Patient is scheduled for Port-A-Cath removal on 07/31/2017.  The risks and benefits of the procedure were fully explained to the patient, who gave informed consent.  She is to  stop her Coumadin on 07/27/2017.

## 2017-07-22 ENCOUNTER — Ambulatory Visit (INDEPENDENT_AMBULATORY_CARE_PROVIDER_SITE_OTHER): Payer: Medicare Other | Admitting: *Deleted

## 2017-07-22 DIAGNOSIS — I4891 Unspecified atrial fibrillation: Secondary | ICD-10-CM | POA: Diagnosis not present

## 2017-07-22 DIAGNOSIS — Z5181 Encounter for therapeutic drug level monitoring: Secondary | ICD-10-CM | POA: Diagnosis not present

## 2017-07-22 LAB — POCT INR: INR: 2.5

## 2017-07-22 NOTE — Patient Instructions (Signed)
Continue coumadin 1/2 tablet daily except 1 tablet on Tuesdays and Fridays Will take last dose of coumadin 3/31 for port removal and restart night of procedure on 07/31/17  Recheck in 4 weeks.

## 2017-07-31 ENCOUNTER — Encounter (HOSPITAL_COMMUNITY)
Admission: RE | Disposition: A | Discharge status: Home / Self Care | Payer: Self-pay | Source: Ambulatory Visit | Attending: General Surgery

## 2017-07-31 ENCOUNTER — Other Ambulatory Visit: Payer: Self-pay

## 2017-07-31 ENCOUNTER — Encounter (HOSPITAL_COMMUNITY): Payer: Self-pay | Admitting: *Deleted

## 2017-07-31 ENCOUNTER — Ambulatory Visit (HOSPITAL_COMMUNITY)
Admission: RE | Admit: 2017-07-31 | Discharge: 2017-07-31 | Disposition: A | Discharge status: Home / Self Care | Payer: Medicare Other | Source: Ambulatory Visit | Attending: General Surgery | Admitting: General Surgery

## 2017-07-31 DIAGNOSIS — K219 Gastro-esophageal reflux disease without esophagitis: Secondary | ICD-10-CM | POA: Diagnosis not present

## 2017-07-31 DIAGNOSIS — F419 Anxiety disorder, unspecified: Secondary | ICD-10-CM | POA: Diagnosis not present

## 2017-07-31 DIAGNOSIS — I480 Paroxysmal atrial fibrillation: Secondary | ICD-10-CM | POA: Diagnosis not present

## 2017-07-31 DIAGNOSIS — M8580 Other specified disorders of bone density and structure, unspecified site: Secondary | ICD-10-CM | POA: Insufficient documentation

## 2017-07-31 DIAGNOSIS — Z853 Personal history of malignant neoplasm of breast: Secondary | ICD-10-CM | POA: Diagnosis not present

## 2017-07-31 DIAGNOSIS — Z7901 Long term (current) use of anticoagulants: Secondary | ICD-10-CM | POA: Insufficient documentation

## 2017-07-31 DIAGNOSIS — I10 Essential (primary) hypertension: Secondary | ICD-10-CM | POA: Diagnosis not present

## 2017-07-31 DIAGNOSIS — C50911 Malignant neoplasm of unspecified site of right female breast: Secondary | ICD-10-CM | POA: Diagnosis not present

## 2017-07-31 DIAGNOSIS — Z79899 Other long term (current) drug therapy: Secondary | ICD-10-CM | POA: Insufficient documentation

## 2017-07-31 DIAGNOSIS — E785 Hyperlipidemia, unspecified: Secondary | ICD-10-CM | POA: Diagnosis not present

## 2017-07-31 DIAGNOSIS — Z452 Encounter for adjustment and management of vascular access device: Secondary | ICD-10-CM | POA: Insufficient documentation

## 2017-07-31 DIAGNOSIS — I4891 Unspecified atrial fibrillation: Secondary | ICD-10-CM | POA: Insufficient documentation

## 2017-07-31 DIAGNOSIS — Z9221 Personal history of antineoplastic chemotherapy: Secondary | ICD-10-CM | POA: Diagnosis not present

## 2017-07-31 DIAGNOSIS — Z882 Allergy status to sulfonamides status: Secondary | ICD-10-CM | POA: Insufficient documentation

## 2017-07-31 DIAGNOSIS — Z8249 Family history of ischemic heart disease and other diseases of the circulatory system: Secondary | ICD-10-CM | POA: Diagnosis not present

## 2017-07-31 DIAGNOSIS — Z888 Allergy status to other drugs, medicaments and biological substances status: Secondary | ICD-10-CM | POA: Diagnosis not present

## 2017-07-31 HISTORY — PX: PORT-A-CATH REMOVAL: SHX5289

## 2017-07-31 SURGERY — MINOR REMOVAL PORT-A-CATH
Anesthesia: LOCAL | Laterality: Right

## 2017-07-31 MED ORDER — LIDOCAINE HCL (PF) 1 % IJ SOLN
INTRAMUSCULAR | Status: AC
  Filled 2017-07-31: qty 30

## 2017-07-31 MED ORDER — LIDOCAINE HCL (PF) 1 % IJ SOLN
INTRAMUSCULAR | Status: DC | PRN
  Administered 2017-07-31: 8 mL

## 2017-07-31 SURGICAL SUPPLY — 22 items
ADH SKN CLS APL DERMABOND .7 (GAUZE/BANDAGES/DRESSINGS) ×1
CHLORAPREP W/TINT 10.5 ML (MISCELLANEOUS) ×2 IMPLANT
CLOTH BEACON ORANGE TIMEOUT ST (SAFETY) ×2 IMPLANT
DECANTER SPIKE VIAL GLASS SM (MISCELLANEOUS) ×2 IMPLANT
DERMABOND ADVANCED (GAUZE/BANDAGES/DRESSINGS) ×1
DERMABOND ADVANCED .7 DNX12 (GAUZE/BANDAGES/DRESSINGS) ×1 IMPLANT
DRAPE PROXIMA HALF (DRAPES) ×2 IMPLANT
ELECT REM PT RETURN 9FT ADLT (ELECTROSURGICAL)
ELECTRODE REM PT RTRN 9FT ADLT (ELECTROSURGICAL) ×1 IMPLANT
GLOVE BIOGEL PI IND STRL 7.0 (GLOVE) ×1 IMPLANT
GLOVE BIOGEL PI INDICATOR 7.0 (GLOVE) ×1
GLOVE SURG SS PI 7.5 STRL IVOR (GLOVE) ×2 IMPLANT
GOWN STRL REUS W/TWL LRG LVL3 (GOWN DISPOSABLE) ×2 IMPLANT
NDL HYPO 25X1 1.5 SAFETY (NEEDLE) ×1 IMPLANT
NEEDLE HYPO 25X1 1.5 SAFETY (NEEDLE) ×2 IMPLANT
PENCIL HANDSWITCHING (ELECTRODE) ×1 IMPLANT
SPONGE GAUZE 2X2 8PLY STRL LF (GAUZE/BANDAGES/DRESSINGS) ×2 IMPLANT
SUT MNCRL AB 4-0 PS2 18 (SUTURE) ×2 IMPLANT
SUT VIC AB 3-0 SH 27 (SUTURE) ×2
SUT VIC AB 3-0 SH 27X BRD (SUTURE) ×1 IMPLANT
SYR CONTROL 10ML LL (SYRINGE) ×2 IMPLANT
TOWEL OR 17X26 4PK STRL BLUE (TOWEL DISPOSABLE) ×2 IMPLANT

## 2017-07-31 NOTE — Interval H&P Note (Signed)
History and Physical Interval Note:  07/31/2017 9:13 AM  Stacie Huang  has presented today for surgery, with the diagnosis of right breast cancer  The various methods of treatment have been discussed with the patient and family. After consideration of risks, benefits and other options for treatment, the patient has consented to  Procedure(s): MINOR REMOVAL PORT-A-CATH (Left) as a surgical intervention .  The patient's history has been reviewed, patient examined, no change in status, stable for surgery.  I have reviewed the patient's chart and labs.  Questions were answered to the patient's satisfaction.     Aviva Signs

## 2017-07-31 NOTE — Op Note (Signed)
Patient:  Stacie Huang  DOB:  1941/03/23  MRN:  [id number]   Preop Diagnosis: Left breast carcinoma, finished with chemotherapy  Postop Diagnosis: Same  Procedure: Port-A-Cath removal  Surgeon: Aviva Signs, MD  Anes: Local  Indications: Patient is a 77 year old white female who has finished chemotherapy for treatment of the left breast carcinoma.  The risks and benefits of the procedure were fully explained to the patient, who gave informed consent.  Procedure note: The patient was placed in supine position.  The right upper chest was prepped and draped using the usual sterile technique with DuraPrep.  Surgical site confirmation was performed.  1% Xylocaine was used for local anesthesia.  An incision was made through the previous surgical scar.  The dissection was taken down to the Port-A-Cath.  The Port-A-Cath was removed in total without difficulty.  It was disposed of.  Subcutaneous layer was reapproximated using a 3-0 Vicryl interrupted suture.  The skin was closed using a 4-0 Monocryl subcuticular suture.  Dermabond was applied.  All tape and needle counts were correct at the end of the procedure.  The patient was discharged from the minor procedure room in good and stable condition.  Complications: None  EBL: Minimal  Specimen: None

## 2017-07-31 NOTE — Discharge Instructions (Signed)
May restart coumadin tomorrow 08/01/17  Implanted Port Removal, Care After  Refer to this sheet in the next few weeks. These instructions provide you with information about caring for yourself after your procedure. Your health care provider may also give you more specific instructions. Your treatment has been planned according to current medical practices, but problems sometimes occur. Call your health care provider if you have any problems or questions after your procedure. What can I expect after the procedure? After the procedure, it is common to have:  Soreness or pain near your incision.  Some swelling or bruising near your incision.  Follow these instructions at home:  Medicines  Take over-the-counter and prescription medicines only as told by your health care provider.  If you were prescribed an antibiotic medicine, take it as told by your health care provider. Do not stop taking the antibiotic even if you start to feel better. Bathing   Do not take baths, swim, or use a hot tub until your health care provider approves. Ask your health care provider if you can take showers. You may only be allowed to take sponge baths for bathing. Incision care   Follow instructions from your health care provider about how to take care of your incision. Make sure you: ? Wash your hands with soap and water before you change your bandage (dressing). If soap and water are not available, use hand sanitizer. ? Change your dressing as told by your health care provider. ? Keep your dressing dry. ? Leave stitches (sutures), skin glue, or adhesive strips in place. These skin closures may need to stay in place for 2 weeks or longer. If adhesive strip edges start to loosen and curl up, you may trim the loose edges. Do not remove adhesive strips completely unless your health care provider tells you to do that.  Check your incision area every day for signs of infection. Check for: ? More redness, swelling,  or pain. ? More fluid or blood. ? Warmth. ? Pus or a bad smell. ?  Driving  If you received a sedative, do not drive for 24 hours after the procedure.  If you did not receive a sedative, ask your health care provider when it is safe to drive.   Activity  Return to your normal activities as told by your health care provider. Ask your health care provider what activities are safe for you.  Until your health care provider says it is safe: ? Do not lift anything that is heavier than 10 lb (4.5 kg). ? Do not do activities that involve lifting your arms over your head. ?  General instructions  Do not use any tobacco products, such as cigarettes, chewing tobacco, and e-cigarettes. Tobacco can delay healing. If you need help quitting, ask your health care provider.  Keep all follow-up visits as told by your health care provider. This is important.   Contact a health care provider if:  You have more redness, swelling, or pain around your incision.  You have more fluid or blood coming from your incision.  Your incision feels warm to the touch.  You have pus or a bad smell coming from your incision.  You have a fever.  You have pain that is not relieved by your pain medicine.   Get help right away if:  You have chest pain.  You have difficulty breathing. This information is not intended to replace advice given to you by your health care provider. Make sure you discuss any  questions you have with your health care provider. Document Released: 03/26/2015 Document Revised: 09/20/2015 Document Reviewed: 01/17/2015 Elsevier Interactive Patient Education  [employer].

## 2017-08-03 ENCOUNTER — Encounter (HOSPITAL_COMMUNITY): Payer: Self-pay | Admitting: General Surgery

## 2017-08-12 ENCOUNTER — Other Ambulatory Visit (HOSPITAL_COMMUNITY): Payer: Self-pay | Admitting: Adult Health

## 2017-08-19 ENCOUNTER — Ambulatory Visit (INDEPENDENT_AMBULATORY_CARE_PROVIDER_SITE_OTHER): Payer: Medicare Other | Admitting: *Deleted

## 2017-08-19 DIAGNOSIS — Z5181 Encounter for therapeutic drug level monitoring: Secondary | ICD-10-CM

## 2017-08-19 DIAGNOSIS — I4891 Unspecified atrial fibrillation: Secondary | ICD-10-CM | POA: Diagnosis not present

## 2017-08-19 LAB — POCT INR: INR: 2.6

## 2017-08-19 NOTE — Patient Instructions (Signed)
Continue coumadin 1/2 tablet daily except 1 tablet on Tuesdays and Fridays Recheck in 4 weeks  

## 2017-09-10 ENCOUNTER — Other Ambulatory Visit: Payer: Self-pay | Admitting: Adult Health

## 2017-09-16 ENCOUNTER — Ambulatory Visit (INDEPENDENT_AMBULATORY_CARE_PROVIDER_SITE_OTHER): Payer: Medicare Other | Admitting: *Deleted

## 2017-09-16 DIAGNOSIS — Z5181 Encounter for therapeutic drug level monitoring: Secondary | ICD-10-CM

## 2017-09-16 DIAGNOSIS — I4891 Unspecified atrial fibrillation: Secondary | ICD-10-CM

## 2017-09-16 LAB — POCT INR: INR: 3 (ref 2.0–3.0)

## 2017-09-16 NOTE — Patient Instructions (Signed)
Continue coumadin 1/2 tablet daily except 1 tablet on Tuesdays and Fridays Recheck in 6 weeks  

## 2017-10-28 ENCOUNTER — Ambulatory Visit (INDEPENDENT_AMBULATORY_CARE_PROVIDER_SITE_OTHER): Payer: Medicare Other | Admitting: *Deleted

## 2017-10-28 DIAGNOSIS — I4891 Unspecified atrial fibrillation: Secondary | ICD-10-CM

## 2017-10-28 DIAGNOSIS — Z5181 Encounter for therapeutic drug level monitoring: Secondary | ICD-10-CM | POA: Diagnosis not present

## 2017-10-28 LAB — POCT INR: INR: 2.7 (ref 2.0–3.0)

## 2017-10-28 NOTE — Patient Instructions (Signed)
Continue coumadin 1/2 tablet daily except 1 tablet on Tuesdays and Fridays Recheck in 6 weeks  

## 2017-10-30 ENCOUNTER — Other Ambulatory Visit: Payer: Self-pay | Admitting: Cardiology

## 2017-11-20 ENCOUNTER — Other Ambulatory Visit (HOSPITAL_COMMUNITY): Payer: Self-pay

## 2017-11-20 DIAGNOSIS — Z170 Estrogen receptor positive status [ER+]: Secondary | ICD-10-CM

## 2017-11-20 DIAGNOSIS — C50911 Malignant neoplasm of unspecified site of right female breast: Secondary | ICD-10-CM

## 2017-11-20 DIAGNOSIS — C50212 Malignant neoplasm of upper-inner quadrant of left female breast: Secondary | ICD-10-CM

## 2017-11-20 DIAGNOSIS — M8580 Other specified disorders of bone density and structure, unspecified site: Secondary | ICD-10-CM

## 2017-11-24 ENCOUNTER — Encounter (HOSPITAL_COMMUNITY): Payer: Self-pay

## 2017-11-24 ENCOUNTER — Inpatient Hospital Stay (HOSPITAL_COMMUNITY): Payer: Medicare Other

## 2017-11-24 ENCOUNTER — Other Ambulatory Visit: Payer: Self-pay

## 2017-11-24 ENCOUNTER — Inpatient Hospital Stay (HOSPITAL_COMMUNITY): Payer: Medicare Other | Attending: Hematology

## 2017-11-24 DIAGNOSIS — M8580 Other specified disorders of bone density and structure, unspecified site: Secondary | ICD-10-CM | POA: Diagnosis not present

## 2017-11-24 DIAGNOSIS — C50212 Malignant neoplasm of upper-inner quadrant of left female breast: Secondary | ICD-10-CM

## 2017-11-24 DIAGNOSIS — Z79899 Other long term (current) drug therapy: Secondary | ICD-10-CM

## 2017-11-24 DIAGNOSIS — C50911 Malignant neoplasm of unspecified site of right female breast: Secondary | ICD-10-CM

## 2017-11-24 DIAGNOSIS — Z170 Estrogen receptor positive status [ER+]: Secondary | ICD-10-CM

## 2017-11-24 LAB — COMPREHENSIVE METABOLIC PANEL
ALBUMIN: 3.9 g/dL (ref 3.5–5.0)
ALT: 25 U/L (ref 0–44)
ANION GAP: 9 (ref 5–15)
AST: 25 U/L (ref 15–41)
Alkaline Phosphatase: 42 U/L (ref 38–126)
BILIRUBIN TOTAL: 0.8 mg/dL (ref 0.3–1.2)
BUN: 13 mg/dL (ref 8–23)
CHLORIDE: 105 mmol/L (ref 98–111)
CO2: 24 mmol/L (ref 22–32)
Calcium: 9.3 mg/dL (ref 8.9–10.3)
Creatinine, Ser: 0.99 mg/dL (ref 0.44–1.00)
GFR calc Af Amer: >60 mL/min (ref >60)
GFR, EST NON AFRICAN AMERICAN: 53 mL/min — AB (ref >60)
Glucose, Bld: 128 mg/dL — ABNORMAL HIGH (ref 70–99)
POTASSIUM: 4.1 mmol/L (ref 3.5–5.1)
Sodium: 138 mmol/L (ref 135–145)
TOTAL PROTEIN: 7.1 g/dL (ref 6.5–8.1)

## 2017-11-24 MED ORDER — DENOSUMAB 60 MG/ML ~~LOC~~ SOSY
60.0000 mg | PREFILLED_SYRINGE | Freq: Once | SUBCUTANEOUS | Status: AC
  Administered 2017-11-24: 60 mg via SUBCUTANEOUS
  Filled 2017-11-24: qty 1

## 2017-11-24 NOTE — Progress Notes (Signed)
Stacie Huang presents today for injection per the provider's orders.  Prolia administration without incident; see MAR for injection details.  Patient tolerated procedure well and without incident.  No questions or complaints noted at this time.  Discharged ambulatory.

## 2017-12-09 ENCOUNTER — Ambulatory Visit (INDEPENDENT_AMBULATORY_CARE_PROVIDER_SITE_OTHER): Payer: Medicare Other | Admitting: *Deleted

## 2017-12-09 DIAGNOSIS — Z5181 Encounter for therapeutic drug level monitoring: Secondary | ICD-10-CM

## 2017-12-09 DIAGNOSIS — I4891 Unspecified atrial fibrillation: Secondary | ICD-10-CM

## 2017-12-09 LAB — POCT INR: INR: 2.6 (ref 2.0–3.0)

## 2017-12-09 NOTE — Patient Instructions (Signed)
Continue coumadin 1/2 tablet daily except 1 tablet on Tuesdays and Fridays Recheck in 6 weeks  

## 2018-01-20 ENCOUNTER — Ambulatory Visit (INDEPENDENT_AMBULATORY_CARE_PROVIDER_SITE_OTHER): Payer: Medicare Other | Admitting: *Deleted

## 2018-01-20 DIAGNOSIS — I4891 Unspecified atrial fibrillation: Secondary | ICD-10-CM | POA: Diagnosis not present

## 2018-01-20 DIAGNOSIS — Z5181 Encounter for therapeutic drug level monitoring: Secondary | ICD-10-CM | POA: Diagnosis not present

## 2018-01-20 LAB — POCT INR: INR: 2.6 (ref 2.0–3.0)

## 2018-01-20 NOTE — Patient Instructions (Signed)
Continue coumadin 1/2 tablet daily except 1 tablet on Tuesdays and Fridays Recheck in 6 weeks  

## 2018-02-02 IMAGING — DX DG LUMBAR SPINE COMPLETE 4+V
5 series · 5 of 5 positions shown · non-contrast
Comparison: 10/31/2012

CLINICAL DATA: Pain going down right hip into legs 2 weeks. No
injury.

EXAM:
LUMBAR SPINE - COMPLETE 4+ VIEW

[l-spine ap]
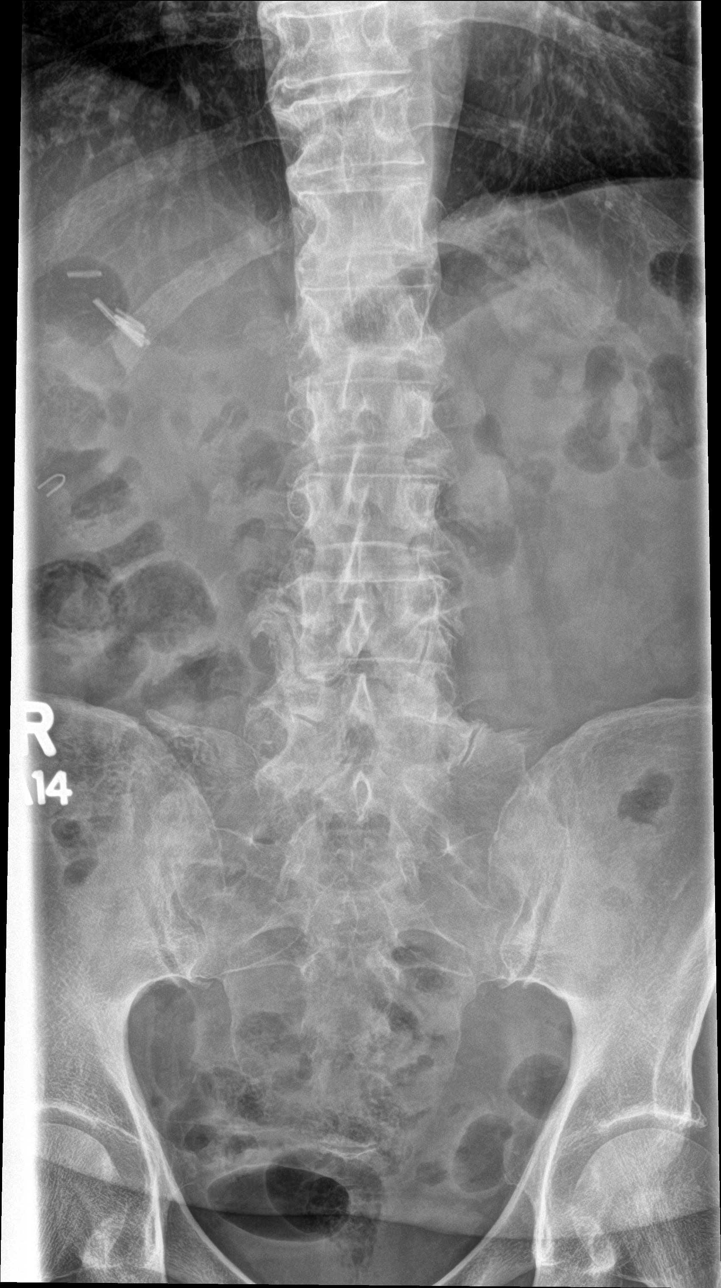

[l-spine obl (1 of 2)]
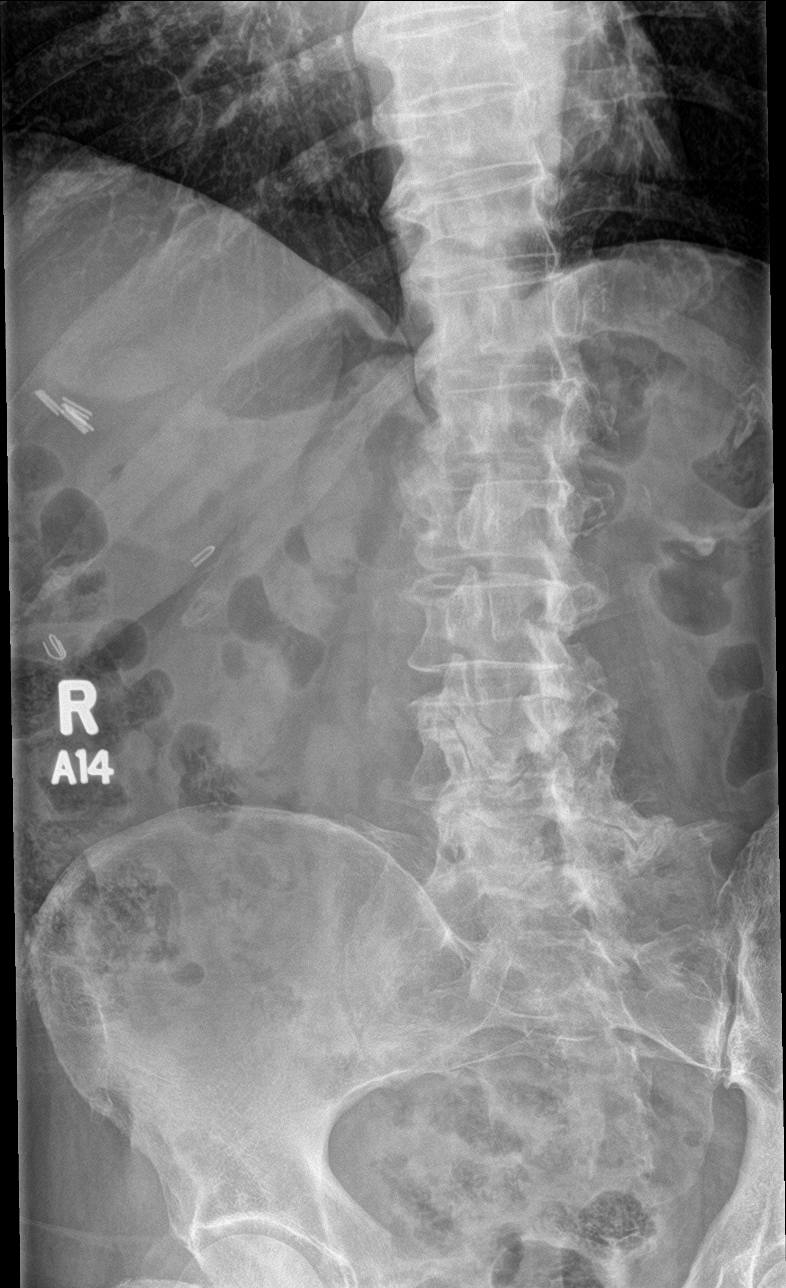

[l-spine obl (2 of 2)]
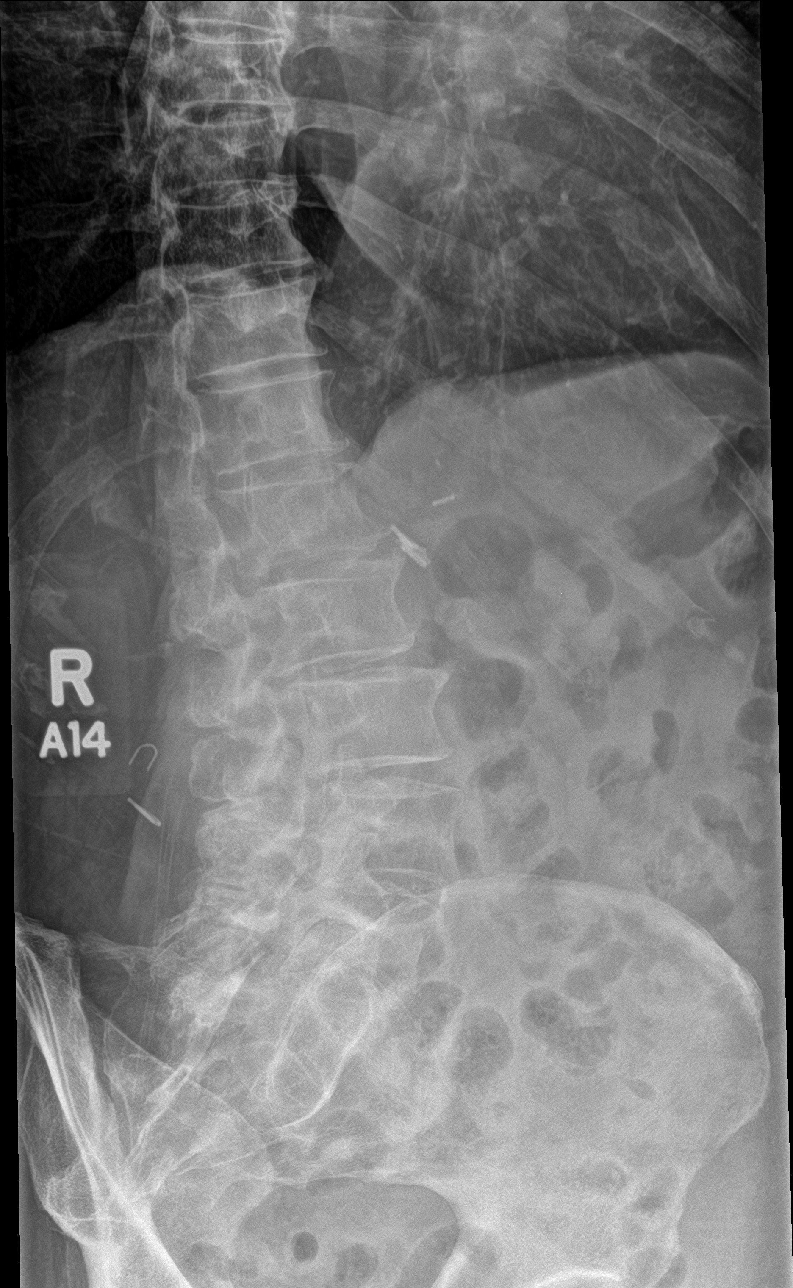

[l-spine lat]
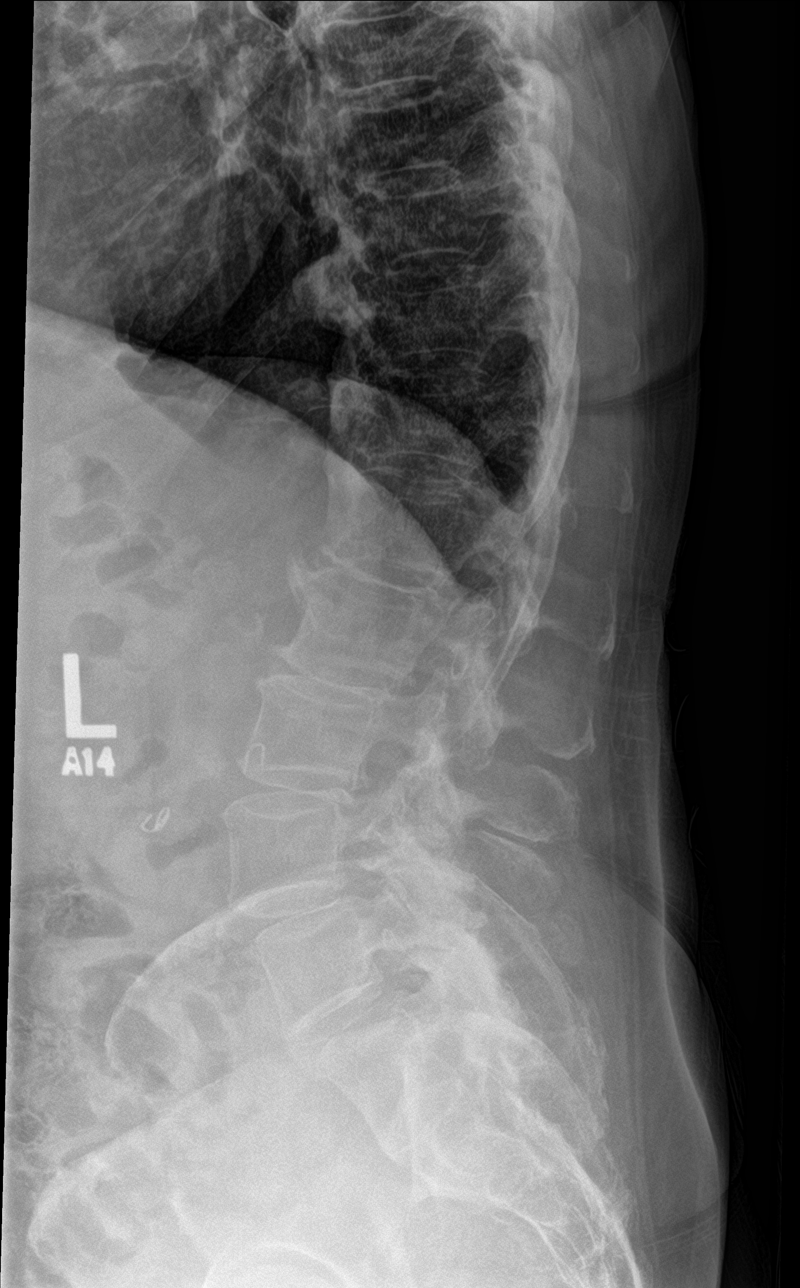

[l-spine spot]
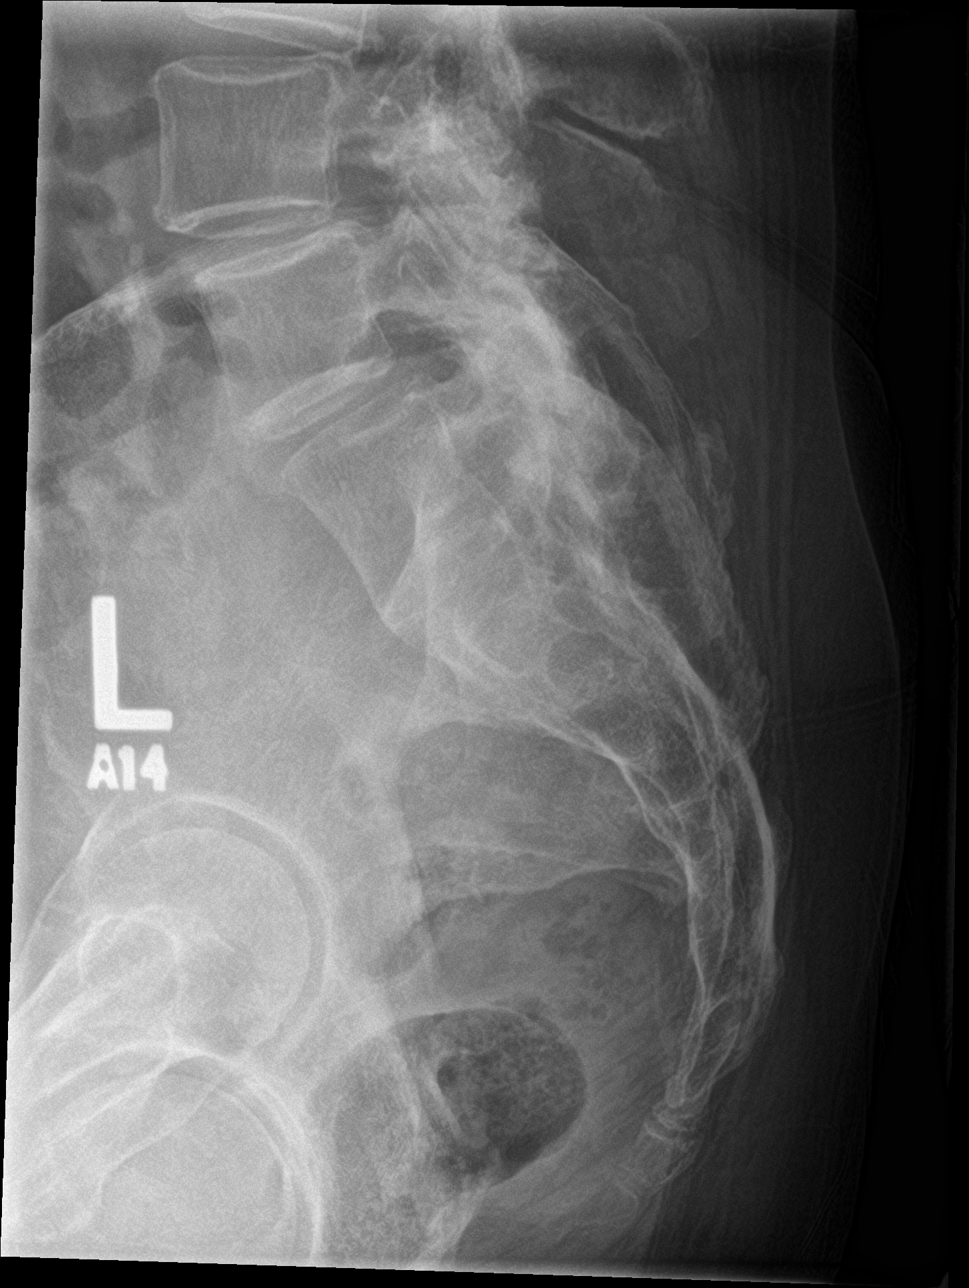

[5 of 5 positions shown; findings below may reference images not displayed]

FINDINGS: Vertebral body heights are within normal. There is mild spondylosis
throughout the lumbar spine to include moderate facet arthropathy of
the lower lumbar spine. There is a very subtle grade 1
anterolisthesis of L4 on L5 due to the facet arthropathy.
Compression fracture. No significant disc space narrowing.
IMPRESSION: Mild spondylosis of the lumbar spine. Subtle grade 1 anterolisthesis
of L4 on L5 due to moderate facet arthropathy.

## 2018-02-02 IMAGING — DX DG HIP (WITH OR WITHOUT PELVIS) 2-3V*R*
3 series · 3 of 3 positions shown · non-contrast
Comparison: None.

CLINICAL DATA: Acute right hip pain for several weeks. No known
injury. Initial encounter.

EXAM:
DG HIP (WITH OR WITHOUT PELVIS) 2-3V RIGHT

[pelvis ap]
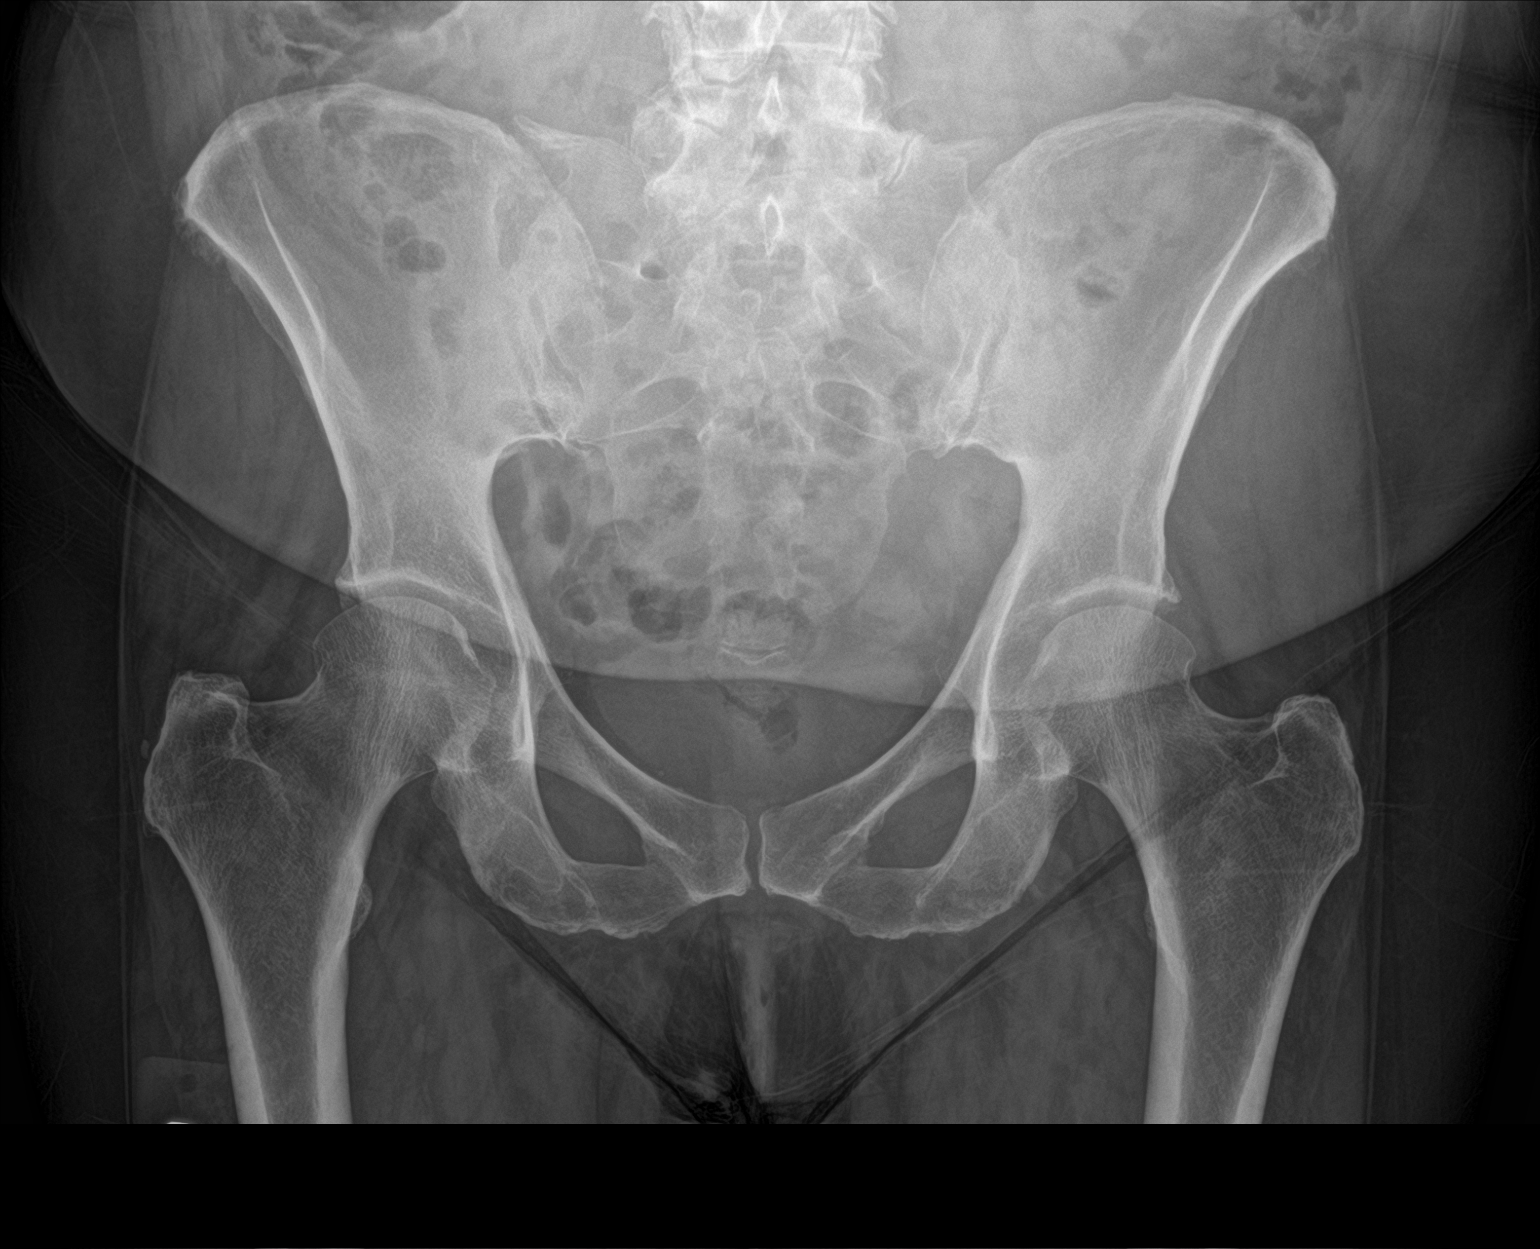

[hip ap]
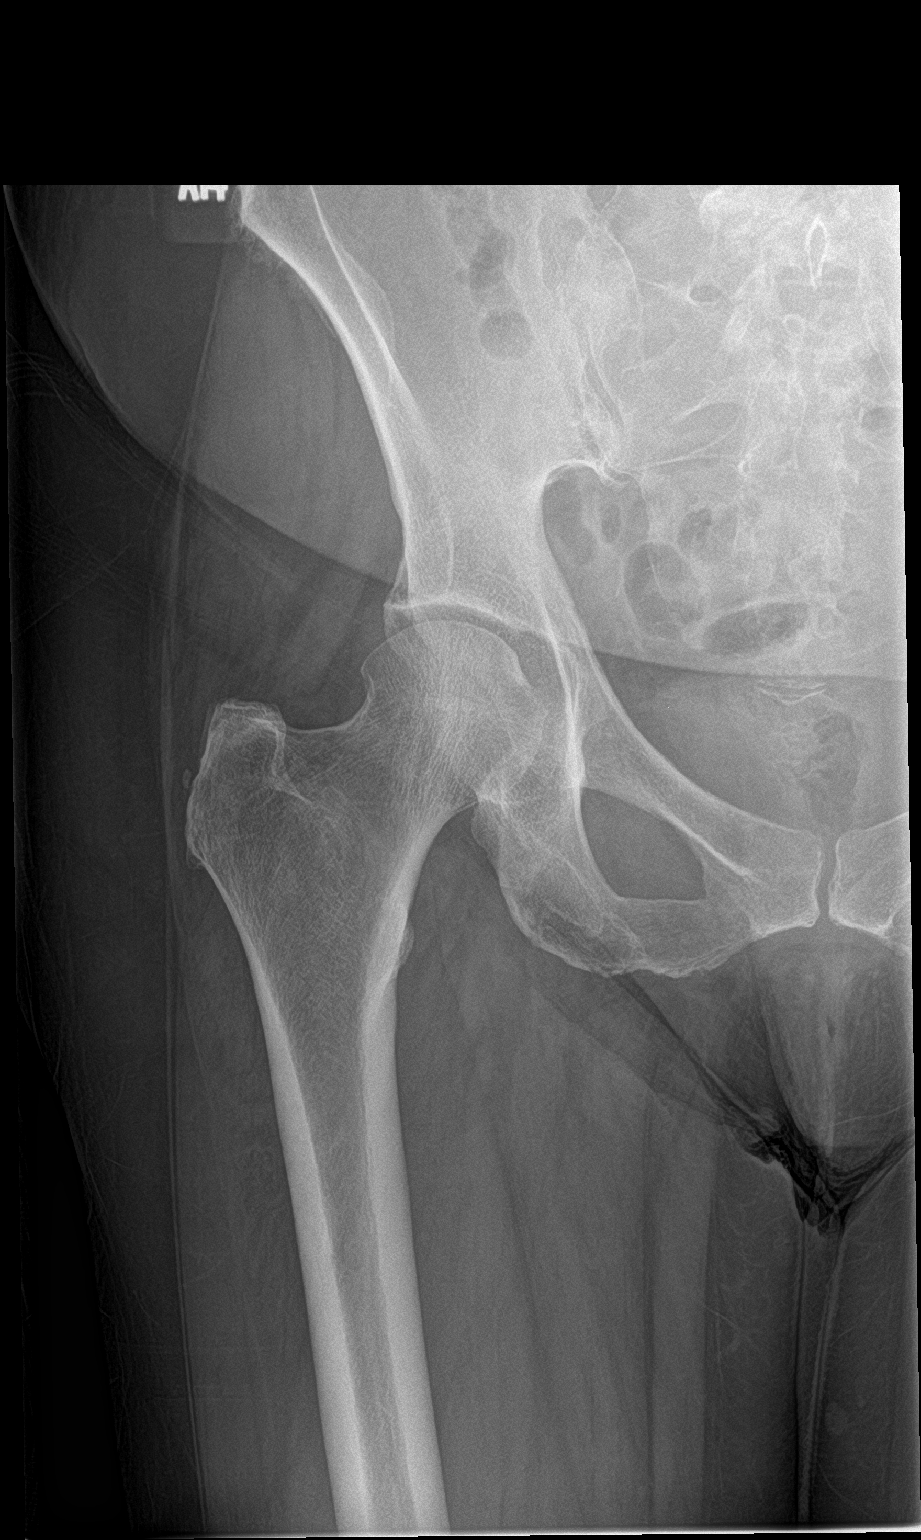

[hip lat]
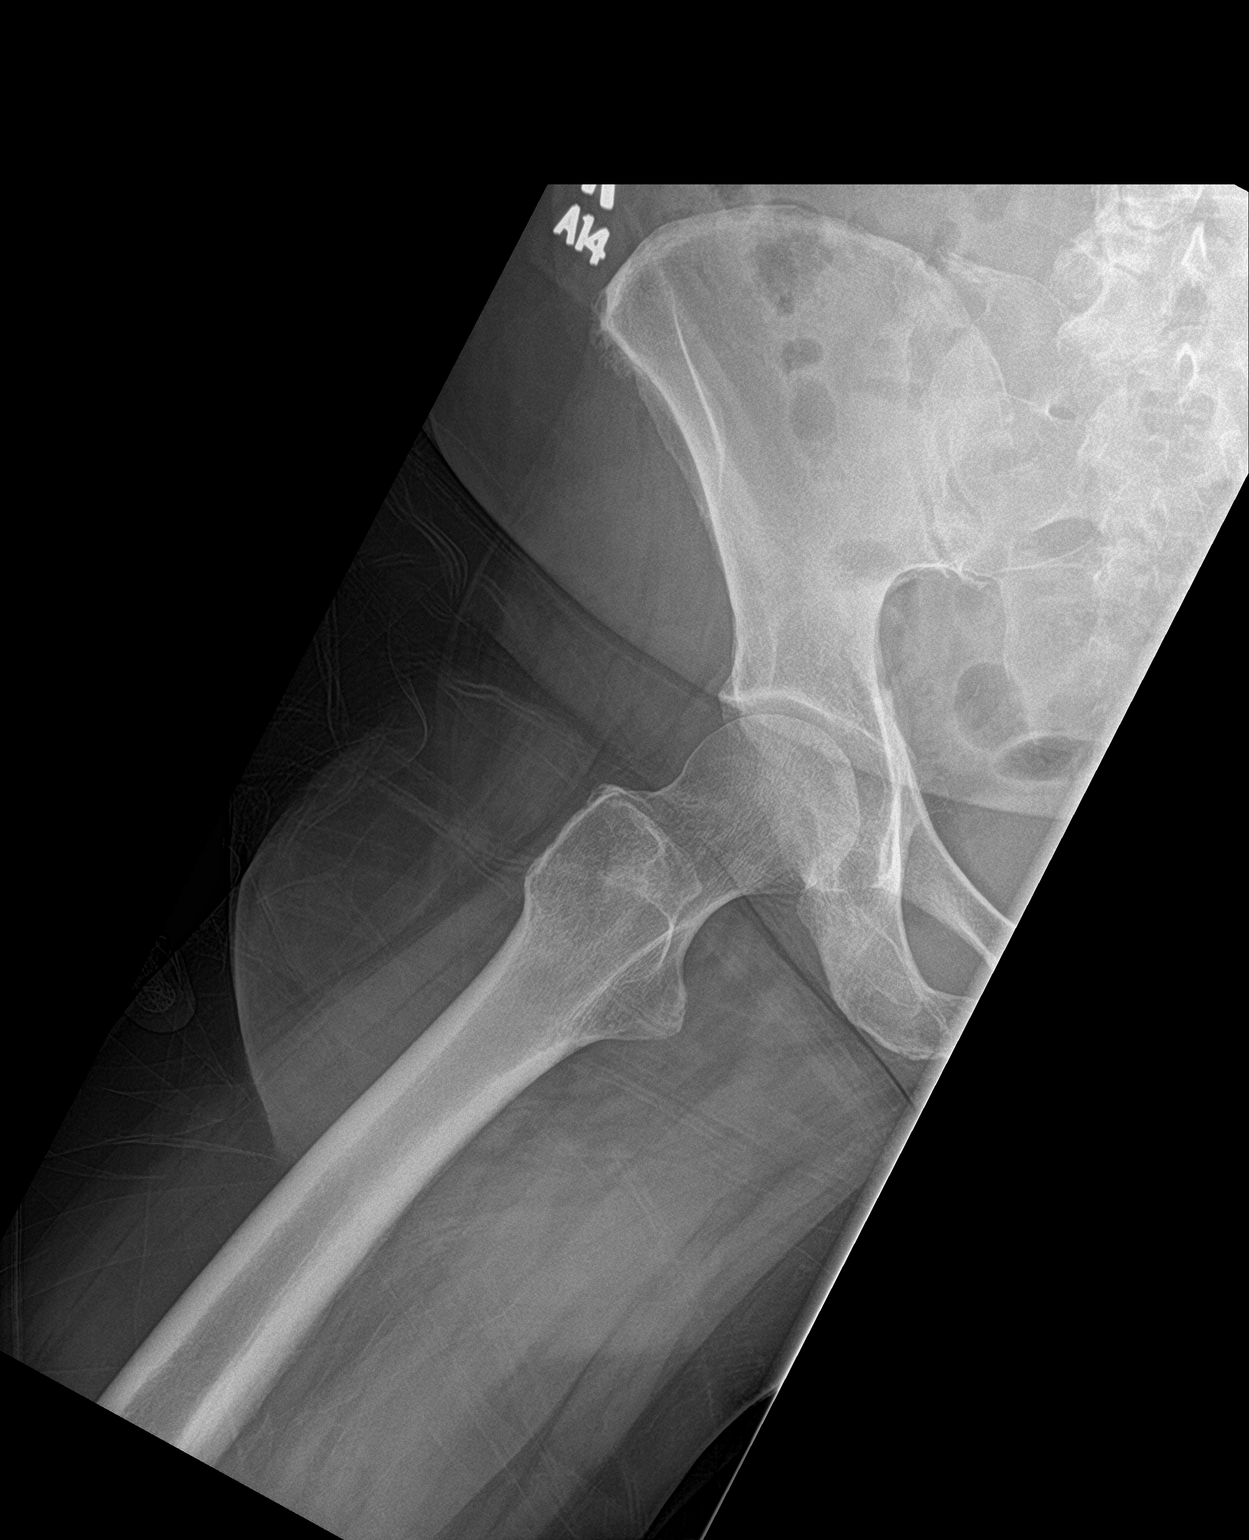

[3 of 3 positions shown; findings below may reference images not displayed]

FINDINGS: There is no evidence of hip fracture or dislocation. There is no
evidence of arthropathy or other focal bone abnormality.
IMPRESSION: Negative.

## 2018-02-09 ENCOUNTER — Other Ambulatory Visit (HOSPITAL_COMMUNITY): Payer: Self-pay | Admitting: Internal Medicine

## 2018-02-09 DIAGNOSIS — Z170 Estrogen receptor positive status [ER+]: Principal | ICD-10-CM

## 2018-02-09 DIAGNOSIS — C50212 Malignant neoplasm of upper-inner quadrant of left female breast: Secondary | ICD-10-CM

## 2018-02-26 ENCOUNTER — Encounter (HOSPITAL_COMMUNITY): Payer: Medicare Other

## 2018-03-02 ENCOUNTER — Ambulatory Visit (HOSPITAL_COMMUNITY)
Admission: RE | Admit: 2018-03-02 | Discharge: 2018-03-02 | Disposition: A | Discharge status: Home / Self Care | Payer: Medicare Other | Source: Ambulatory Visit | Attending: Internal Medicine | Admitting: Internal Medicine

## 2018-03-02 ENCOUNTER — Ambulatory Visit (HOSPITAL_COMMUNITY)
Admission: RE | Admit: 2018-03-02 | Discharge: 2018-03-02 | Disposition: A | Discharge status: Home / Self Care | Payer: Medicare Other | Source: Ambulatory Visit | Attending: Adult Health | Admitting: Adult Health

## 2018-03-02 ENCOUNTER — Other Ambulatory Visit (HOSPITAL_COMMUNITY): Payer: Medicare Other

## 2018-03-02 ENCOUNTER — Ambulatory Visit (HOSPITAL_COMMUNITY): Admission: RE | Admit: 2018-03-02 | Payer: Medicare Other | Source: Ambulatory Visit

## 2018-03-02 ENCOUNTER — Encounter (HOSPITAL_COMMUNITY): Payer: Medicare Other

## 2018-03-02 ENCOUNTER — Ambulatory Visit (HOSPITAL_COMMUNITY): Payer: Medicare Other

## 2018-03-02 DIAGNOSIS — Z1231 Encounter for screening mammogram for malignant neoplasm of breast: Secondary | ICD-10-CM | POA: Insufficient documentation

## 2018-03-02 DIAGNOSIS — Z79811 Long term (current) use of aromatase inhibitors: Secondary | ICD-10-CM

## 2018-03-02 DIAGNOSIS — M85832 Other specified disorders of bone density and structure, left forearm: Secondary | ICD-10-CM | POA: Insufficient documentation

## 2018-03-02 DIAGNOSIS — Z13820 Encounter for screening for osteoporosis: Secondary | ICD-10-CM | POA: Insufficient documentation

## 2018-03-02 DIAGNOSIS — Z780 Asymptomatic menopausal state: Secondary | ICD-10-CM | POA: Diagnosis not present

## 2018-03-02 DIAGNOSIS — M8580 Other specified disorders of bone density and structure, unspecified site: Secondary | ICD-10-CM

## 2018-03-02 DIAGNOSIS — C50212 Malignant neoplasm of upper-inner quadrant of left female breast: Secondary | ICD-10-CM

## 2018-03-02 DIAGNOSIS — Z853 Personal history of malignant neoplasm of breast: Secondary | ICD-10-CM | POA: Diagnosis not present

## 2018-03-02 DIAGNOSIS — Z170 Estrogen receptor positive status [ER+]: Principal | ICD-10-CM

## 2018-03-03 ENCOUNTER — Ambulatory Visit (INDEPENDENT_AMBULATORY_CARE_PROVIDER_SITE_OTHER): Payer: Medicare Other | Admitting: *Deleted

## 2018-03-03 DIAGNOSIS — I4891 Unspecified atrial fibrillation: Secondary | ICD-10-CM | POA: Diagnosis not present

## 2018-03-03 DIAGNOSIS — Z5181 Encounter for therapeutic drug level monitoring: Secondary | ICD-10-CM

## 2018-03-03 LAB — POCT INR: INR: 2.4 (ref 2.0–3.0)

## 2018-03-03 NOTE — Patient Instructions (Signed)
Continue coumadin 1/2 tablet daily except 1 tablet on Tuesdays and Fridays Recheck in 6 weeks  

## 2018-03-08 ENCOUNTER — Other Ambulatory Visit: Payer: Self-pay

## 2018-03-08 ENCOUNTER — Inpatient Hospital Stay (HOSPITAL_COMMUNITY): Payer: Medicare Other | Attending: Hematology | Admitting: Internal Medicine

## 2018-03-08 ENCOUNTER — Encounter (HOSPITAL_COMMUNITY): Payer: Self-pay | Admitting: Internal Medicine

## 2018-03-08 VITALS — BP 105/66 | HR 103 | Temp 98.3°F | Resp 18 | Wt 150.3 lb

## 2018-03-08 DIAGNOSIS — L989 Disorder of the skin and subcutaneous tissue, unspecified: Secondary | ICD-10-CM | POA: Insufficient documentation

## 2018-03-08 DIAGNOSIS — C50411 Malignant neoplasm of upper-outer quadrant of right female breast: Secondary | ICD-10-CM | POA: Insufficient documentation

## 2018-03-08 DIAGNOSIS — I10 Essential (primary) hypertension: Secondary | ICD-10-CM | POA: Diagnosis not present

## 2018-03-08 DIAGNOSIS — Z170 Estrogen receptor positive status [ER+]: Secondary | ICD-10-CM | POA: Insufficient documentation

## 2018-03-08 DIAGNOSIS — Z79899 Other long term (current) drug therapy: Secondary | ICD-10-CM | POA: Diagnosis not present

## 2018-03-08 DIAGNOSIS — Z171 Estrogen receptor negative status [ER-]: Secondary | ICD-10-CM | POA: Diagnosis not present

## 2018-03-08 DIAGNOSIS — C50212 Malignant neoplasm of upper-inner quadrant of left female breast: Secondary | ICD-10-CM | POA: Diagnosis present

## 2018-03-08 DIAGNOSIS — M8580 Other specified disorders of bone density and structure, unspecified site: Secondary | ICD-10-CM | POA: Insufficient documentation

## 2018-03-08 DIAGNOSIS — C50911 Malignant neoplasm of unspecified site of right female breast: Secondary | ICD-10-CM

## 2018-03-08 MED ORDER — ANASTROZOLE 1 MG PO TABS: 1.0000 mg | ORAL_TABLET | Freq: Every day | ORAL | 4 refills | Status: DC

## 2018-03-08 NOTE — Patient Instructions (Signed)
[facility]  Discharge Instructions: You saw Dr. Higgs today                               _______________________________________________________________  Thank you for choosing [facility] to provide your oncology and hematology care.  To afford each patient quality time with our providers, please arrive at least 15 minutes before your scheduled appointment.  You need to re-schedule your appointment if you arrive 10 or more minutes late.  We strive to give you quality time with our providers, and arriving late affects you and other patients whose appointments are after yours.  Also, if you no show three or more times for appointments you may be dismissed from the clinic.  Again, thank you for choosing [facility]. Our hope is that these requests will allow you access to exceptional care and in a timely manner. _______________________________________________________________  If you have questions after your visit, please contact our office at [phone] between the hours of 8:30 a.m. and 5:00 p.m. Voicemails left after 4:30 p.m. will not be returned until the following business day. _______________________________________________________________  For prescription refill requests, have your pharmacy contact our office. _______________________________________________________________  Recommendations made by the consultant and any test results will be sent to your referring physician. _______________________________________________________________ 

## 2018-03-08 NOTE — Progress Notes (Signed)
Diagnosis Invasive ductal carcinoma of breast, female, right (Stacie Huang) - Plan: CBC with Differential/Platelet, Comprehensive metabolic panel, Lactate dehydrogenase  Staging Cancer Staging Invasive ductal carcinoma of breast, female, right [facility]) Staging form: Breast, AJCC 8th Edition - Pathologic stage from 05/08/2016: Stage IIA (pT1b, pN0, cM0, G2, ER: Negative, PR: Negative, HER2: Negative) - Signed by Baird Cancer, PA-C on 05/08/2016  Malignant neoplasm of right female breast [facility]) Staging form: Breast, AJCC 7th Edition - Clinical stage from 03/16/2013: Stage IA (T1b, N0, cM0, Free text: stage I) - Unsigned - Pathologic: Stage IA (T1b, N0, cM0) - Signed by Farrel Gobble, MD on 03/17/2013   Assessment and Plan:  1.  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  Tumor was ER+/PR+/HER2+.  She was treated with definitive left lumptectomy upfront, followed by Taxol/Herceptin x 12 weekly cycles, Taxol was discontinued after 2 cycles due to toxicities.  Herceptin was continued x 52 weeks and she was started on anti-endocrine therapy beginning in 06/2013.  She did not receive adjuvant XRT.   She has been on Arimidex since 06/2013.  Bilateral diagnostic mammogram done 03/02/2018 reviewed and was negative.  Pt is recommended for bilateral diagnostic mammogram in 02/2019.  She will RTC in 08/2018 for follow-up and labs.  Rx for Arimidex #90 with 4 refills sent to pharmacy. Port has been removed.    2.  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  She is S/P right lumpectomy revealing a ER-/PR-/HER2- cancer.  Mammaprint was completed and she was identified to have LOW risk disease.  Thus, no adjuvant chemotherapy was recommended.  She did undergo adjuvant XRT completed on 07/04/2016  3.  Osteopenia in the setting of AI therapy.  Prolia started on 10/08/2015 in combination with Ca++ and Vit  D.  Bone density done 03/02/2018 reviewed and showed osteopenia.  Pt will continue Prolia every 6 months.  Continue calcium and vitamin D.   4.  Skin lesions.  PT has a lesion of right side of face she is concerned about.  She is referred to dermatology for evaluation.    5.  HTN.  BP is 105/66.  Follow-up with PCP.    25 minutes spent with more than 50% spent in counseling and coordination of care.     Interval History:  Historical data obtained from note dated 06/26/2017.  77 y.o. female followed by Dr. Talbert Cage for  B/L breast cancer.  Both breast cancers were stage I.  Left breast cancer diagnosed in 01/2013 and was ER/PR positive and HER-2  positive.  Right breast cancer was diagnosed in 02/2016 and was triple negative.  She is tolerating Arimidex which began 06/2013.    Current Status:  Pt is seen today for follow-up.  She is here to go over mammogram.  She needs refills on Arimidex.      Malignant neoplasm of right female breast ([name])   02/04/2013 Initial Diagnosis    Breast cancer of upper-inner quadrant of left female breast    02/23/2013 Surgery    left lumpectomy (UIQ), sentinel node biopsy--0.7cm, Node negative, ER+, PR+, HER-2/neu over expressed.    04/08/2013 - 04/14/2013 Chemotherapy    Paclitaxel/Herceptin weekly x 12. Intolerance to Paclitaxel and only receiving 2 cycles    05/04/2013 - 04/05/2014 Chemotherapy    Herceptin every 21 days     06/28/2013 -  Chemotherapy    Anastrazole started    08/02/2013 Imaging  Bone density- normal    04/06/2014 Imaging    MUGA- Left ventricular ejection fraction equals 53%.    08/06/2015 Imaging    Bone density- osteopenia    03/06/2016 Procedure    Stereotactic-guided biopsy of right breast upper outer quadrant focal asymmetry/calcifications. No apparent complications.    03/06/2016 Procedure    Successful placement of coil shaped marker within the right breast upper outer quadrant biopsy site, post stereotactic core needle biopsy.      Invasive ductal carcinoma of breast, female, right ([name])   02/26/2016 Mammogram    1. Focal asymmetry within the upper-outer quadrant of the right breast, with associated new calcifications. This is a suspicious finding for which stereotactic biopsy with 3D tomosynthesis guidance is recommended. 2. Cluster of cysts at the 10 o'clock axis, 5 cm from the nipple, measuring 1.2 x 0.6 x 1.1 cm, a possible correlate for the mammographic asymmetry.    03/06/2016 Procedure    Right needle core biopsy    03/07/2016 Pathology Results    Breast, right, needle core biopsy, upper outer quadrant - HIGH GRADE DUCTAL CARCINOMA IN SITU WITH NECROSIS AND ASSOCIATED CALCIFICATIONS.IMMUNOHISTOCHEMICAL AND MORPHOMETRIC ANALYSIS PERFORMED MANUALLY Estrogen Receptor: 0%, NEGATIVE Progesterone Receptor: 0%, NEGATIVE    04/02/2016 Procedure    Breast, lumpectomy, right by Dr. Arnoldo Morale    04/07/2016 Pathology Results    INVASIVE DUCTAL CARCINOMA, GRADE 2, SPANNING 0.6 CM THE CARCINOMA IS FOCALLY PRESENTED AT THE CAUTERIZED MEDIAL MARGIN EXTENSIVE DUCTAL CARCINOMA IN SITU, GRADE 3 ALL OTHER SURGICAL MARGIN ARE NEGATIVE FOR CARCINOMA ER 0% PR 0% Ki-67 15% HER2 NEGATIVE    05/05/2016 Pathology Results    MammaPrint: LOW RISK (97.8% of low risk Mammaprint patients who were treated with anti-hormonal therapy alone are living without distant recurrence of breast cancer at 5-years).    06/02/2016 - 07/04/2016 Radiation Therapy    XRT in [place], [place].  Right breast 42.56 Gy delivered in 16 fractions at 2.65 Gy per fraction and a right tumor bed boost 16 Gy delivered in 8 fractions at 2 Gy per fraction for a total dose of 58.56 Gy completed on 07/04/2016.      Problem List Patient Active Problem List   Diagnosis Date Noted  . Invasive ductal carcinoma of breast, female, right ([place]) [C50.911] 05/08/2016  . High risk medication use [Z79.899] 10/03/2015  . Osteopenia determined by x-ray [M85.80] 10/03/2015  . GERD  (gastroesophageal reflux disease) [K21.9] 08/11/2013  . Bloating [R14.0] 08/11/2013  . Encounter for therapeutic drug monitoring [Z51.81] 06/17/2013  . Paroxysmal atrial fibrillation ([place]) [I48.0] 03/17/2013  . Vitamin D deficiency [E55.9] 03/16/2013  . Malignant neoplasm of right female breast ([place]) [C50.911] 03/16/2013  . Atrial fibrillation ([place]) [I48.91]   . Hypertension [I10]   . Chronic anticoagulation [Z79.01] 11/11/2010  . Hyperlipidemia [E78.5] 02/26/2010    Past Medical History Past Medical History:  Diagnosis Date  . Anxiety   . Breast cancer ([place])   . Cancer ([place])   . Chronic anticoagulation   . GERD (gastroesophageal reflux disease)   . Hyperlipidemia   . Hypertension   . Invasive ductal carcinoma of breast, female, right ([place]) 05/08/2016  . Osteopenia determined by x-ray 10/03/2015  . Paroxysmal atrial fibrillation [facility])     Past Surgical History Past Surgical History:  Procedure Laterality Date  . ABDOMINAL HYSTERECTOMY    . AXILLARY LYMPH NODE DISSECTION Left 02/23/2013   Procedure: LEFT AXILLARY LYMPH NODE DISSECTION;  Surgeon: Scherry Ran, MD;  Location: [facility];  Service:  General;  Laterality: Left;  . CHOLECYSTECTOMY    . COLONOSCOPY  03/10/2002   RMR: Incomplete colonoscopy (sigmoidoscopy)/Normal rectum/Normal colon to 40 cm.   . ESOPHAGOGASTRODUODENOSCOPY  03/10/2002   ESP:QZRAQT esophagus/small HH/Tiny AVM in antrum, otherwise normal stomach and D1 and D2/Status post passage of the [facility] [facility] [employer] dilator  . PARTIAL MASTECTOMY WITH AXILLARY SENTINEL LYMPH NODE BIOPSY Left 02/23/2013   Procedure: LEFT PARTIAL MASTECTOMY WITH AXILLARY SIMPLE NODE BIOPSY, LEFT BREAST WIDE EXCISION;  Surgeon: Scherry Ran, MD;  Location: [facility];  Service: General;  Laterality: Left;  . PARTIAL MASTECTOMY WITH NEEDLE LOCALIZATION Right 04/02/2016   Procedure: RIGHT PARTIAL MASTECTOMY AFTER NEEDLE LOCALIZATION;  Surgeon: Aviva Signs, MD;  Location: [facility];  Service: General;   Laterality: Right;  . PORT-A-CATH REMOVAL Right 07/31/2017   Procedure: MINOR REMOVAL PORT-A-CATH;  Surgeon: Aviva Signs, MD;  Location: [facility];  Service: General;  Laterality: Right;  . PORTACATH PLACEMENT Right 04/07/2013  . PORTACATH PLACEMENT Right 04/07/2013   Procedure: INSERTION PORT-A-CATH;  Surgeon: Scherry Ran, MD;  Location: [facility];  Service: General;  Laterality: Right;    Family History Family History  Problem Relation Age of Onset  . Heart disease Mother        Also 29 of 9 siblings  . Heart attack Father   . Leukemia Unknown   . Leukemia Unknown   . Colon cancer Neg Hx      Social History  reports that she has never smoked. She has never used smokeless tobacco. She reports that she does not drink alcohol or use drugs.  Medications  Current Outpatient Medications:  .  anastrozole (ARIMIDEX) 1 MG tablet, Take 1 tablet (1 mg total) by mouth daily., Disp: 90 tablet, Rfl: 4 .  atenolol (TENORMIN) 50 MG tablet, Take 50 mg by mouth daily before breakfast. , Disp: , Rfl:  .  calcium carbonate (OS-CAL) 600 MG TABS tablet, Take 600 mg by mouth 2 (two) times daily with a meal. , Disp: , Rfl:  .  cetirizine (ZYRTEC) 10 MG tablet, Take 10 mg by mouth every morning. , Disp: , Rfl:  .  Cholecalciferol (VITAMIN D) 400 UNITS capsule, Take 400 Units by mouth 2 (two) times daily. , Disp: , Rfl:  .  denosumab (PROLIA) 60 MG/ML SOLN injection, Inject 60 mg into the skin every 6 (six) months. Administer in upper arm, thigh, or abdomen, Disp: , Rfl:  .  diltiazem (CARDIZEM CD) 120 MG 24 hr capsule, TAKE 120 MG BY MOUTH EVERY DAY., Disp: 90 capsule, Rfl: 3 .  fluticasone (FLONASE) 50 MCG/ACT nasal spray, Place 2 sprays into both nostrils at bedtime. , Disp: , Rfl:  .  LORazepam (ATIVAN) 1 MG tablet, Take 1 mg by mouth at bedtime. , Disp: , Rfl:  .  losartan (COZAAR) 50 MG tablet, Take 50 mg by mouth every morning. , Disp: , Rfl:  .  metoCLOPramide (REGLAN) 10 MG tablet, TAKE 1 TABLET  BY MOUTH EVERY 8 HOURS AS NEEDED FOR NAUSEA. (Patient taking differently: TAKE 10 MG BY MOUTH EVERY 8 HOURS AS NEEDED FOR NAUSEA.), Disp: 120 tablet, Rfl: 1 .  pantoprazole (PROTONIX) 40 MG tablet, TAKE (1) TABLET BY MOUTH TWICE DAILY. (Patient taking differently: TAKE 40 MG BY MOUTH TWICE DAILY.), Disp: 60 tablet, Rfl: 5 .  psyllium (METAMUCIL) 58.6 % powder, Take 1 packet by mouth daily. , Disp: , Rfl:  .  simvastatin (ZOCOR) 10 MG tablet, Take 10 mg by mouth at bedtime.  ,  Disp: , Rfl:  .  warfarin (COUMADIN) 2.5 MG tablet, TAKE 1/2 TABLET BY MOUTH DAILY EXCEPT 1 TABLET ON TUESDAYS AND FRIDAYS., Disp: 30 tablet, Rfl: 3  Allergies Iohexol; Sulfa antibiotics; and Sulfasalazine  Review of Systems Review of Systems - Oncology ROS negative other than facial lesion   Physical Exam  Vitals Wt Readings from Last 3 Encounters:  03/08/18 150 lb 4.8 oz (68.2 kg)  07/21/17 148 lb (67.1 kg)  06/26/17 148 lb (67.1 kg)   Temp Readings from Last 3 Encounters:  03/08/18 98.3 F (36.8 C) (Oral)  11/24/17 98.1 F (36.7 C) (Oral)  07/31/17 98 F (36.7 C) (Oral)   BP Readings from Last 3 Encounters:  03/08/18 105/66  11/24/17 136/78  07/31/17 138/75   Pulse Readings from Last 3 Encounters:  03/08/18 (!) 103  11/24/17 88  07/31/17 80    Constitutional: Well-developed, well-nourished, and in no distress.   HENT: Head: Normocephalic and atraumatic.  Mouth/Throat: No oropharyngeal exudate. Mucosa moist. Eyes: Pupils are equal, round, and reactive to light. Conjunctivae are normal. No scleral icterus.  Neck: Normal range of motion. Neck supple. No JVD present.  Cardiovascular: Normal rate, regular rhythm and normal heart sounds.  Exam reveals no gallop and no friction rub.   No murmur heard. Pulmonary/Chest: Effort normal and breath sounds normal. No respiratory distress. No wheezes.No rales.  Abdominal: Soft. Bowel sounds are normal. No distension. There is no tenderness. There is no  guarding.  Musculoskeletal: No edema or tenderness.  Lymphadenopathy: No cervical, axillary or supraclavicular adenopathy.  Neurological: Alert and oriented to person, place, and time. No cranial nerve deficit.  Skin: Skin is warm and dry. Facial lesion noted on right side of face.   Psychiatric: Affect and judgment normal.  Breast exam:  Chaperone present.  Bilateral lumpectomy changes noted.  No dominant masses palpable bilaterally.    Labs No visits with results within 3 Day(s) from this visit.  Latest known visit with results is:  Anti-coag visit on 03/03/2018  Component Date Value Ref Range Status  . INR 03/03/2018 2.4  2.0 - 3.0 Final     Pathology Orders Placed This Encounter  Procedures  . CBC with Differential/Platelet    Standing Status:   Future    Standing Expiration Date:   03/08/2020  . Comprehensive metabolic panel    Standing Status:   Future    Standing Expiration Date:   03/08/2020  . Lactate dehydrogenase    Standing Status:   Future    Standing Expiration Date:   03/08/2020       Zoila Shutter MD

## 2018-03-09 ENCOUNTER — Other Ambulatory Visit: Payer: Self-pay | Admitting: Cardiology

## 2018-03-11 ENCOUNTER — Other Ambulatory Visit (HOSPITAL_COMMUNITY): Payer: Self-pay | Admitting: *Deleted

## 2018-03-11 MED ORDER — METOCLOPRAMIDE HCL 10 MG PO TABS: 10.0000 mg | ORAL_TABLET | Freq: Three times a day (TID) | ORAL | 1 refills | Status: DC | PRN

## 2018-04-14 ENCOUNTER — Ambulatory Visit (INDEPENDENT_AMBULATORY_CARE_PROVIDER_SITE_OTHER): Payer: Medicare Other | Admitting: *Deleted

## 2018-04-14 DIAGNOSIS — I4891 Unspecified atrial fibrillation: Secondary | ICD-10-CM

## 2018-04-14 DIAGNOSIS — Z5181 Encounter for therapeutic drug level monitoring: Secondary | ICD-10-CM | POA: Diagnosis not present

## 2018-04-14 LAB — POCT INR: INR: 2.6 (ref 2.0–3.0)

## 2018-04-14 NOTE — Patient Instructions (Signed)
Continue coumadin 1/2 tablet daily except 1 tablet on Tuesdays and Fridays Recheck in 6 weeks  

## 2018-05-26 ENCOUNTER — Ambulatory Visit (INDEPENDENT_AMBULATORY_CARE_PROVIDER_SITE_OTHER): Payer: Medicare Other | Admitting: Pharmacist

## 2018-05-26 DIAGNOSIS — Z5181 Encounter for therapeutic drug level monitoring: Secondary | ICD-10-CM | POA: Diagnosis not present

## 2018-05-26 DIAGNOSIS — I4891 Unspecified atrial fibrillation: Secondary | ICD-10-CM

## 2018-05-26 LAB — POCT INR: INR: 2.6 (ref 2.0–3.0)

## 2018-05-26 NOTE — Patient Instructions (Signed)
Description   Continue coumadin 1/2 tablet daily except 1 tablet on Tuesdays and Fridays  Recheck in 6 weeks.

## 2018-05-27 ENCOUNTER — Inpatient Hospital Stay (HOSPITAL_COMMUNITY): Payer: Medicare Other | Attending: Hematology

## 2018-05-27 ENCOUNTER — Inpatient Hospital Stay (HOSPITAL_COMMUNITY): Payer: Medicare Other

## 2018-05-27 ENCOUNTER — Encounter (HOSPITAL_COMMUNITY): Payer: Self-pay

## 2018-05-27 VITALS — BP 107/58 | HR 69 | Temp 97.6°F | Resp 16

## 2018-05-27 DIAGNOSIS — M8580 Other specified disorders of bone density and structure, unspecified site: Secondary | ICD-10-CM | POA: Insufficient documentation

## 2018-05-27 DIAGNOSIS — C50911 Malignant neoplasm of unspecified site of right female breast: Secondary | ICD-10-CM

## 2018-05-27 DIAGNOSIS — Z79899 Other long term (current) drug therapy: Secondary | ICD-10-CM

## 2018-05-27 DIAGNOSIS — Z79811 Long term (current) use of aromatase inhibitors: Secondary | ICD-10-CM | POA: Insufficient documentation

## 2018-05-27 DIAGNOSIS — C50212 Malignant neoplasm of upper-inner quadrant of left female breast: Secondary | ICD-10-CM

## 2018-05-27 DIAGNOSIS — C50411 Malignant neoplasm of upper-outer quadrant of right female breast: Secondary | ICD-10-CM | POA: Diagnosis not present

## 2018-05-27 DIAGNOSIS — Z170 Estrogen receptor positive status [ER+]: Secondary | ICD-10-CM | POA: Insufficient documentation

## 2018-05-27 LAB — COMPREHENSIVE METABOLIC PANEL
ALT: 27 U/L (ref 0–44)
AST: 25 U/L (ref 15–41)
Albumin: 4 g/dL (ref 3.5–5.0)
Alkaline Phosphatase: 45 U/L (ref 38–126)
Anion gap: 10 (ref 5–15)
BUN: 15 mg/dL (ref 8–23)
CHLORIDE: 104 mmol/L (ref 98–111)
CO2: 23 mmol/L (ref 22–32)
CREATININE: 1.08 mg/dL — AB (ref 0.44–1.00)
Calcium: 8.8 mg/dL — ABNORMAL LOW (ref 8.9–10.3)
GFR, EST AFRICAN AMERICAN: 57 mL/min — AB (ref >60)
GFR, EST NON AFRICAN AMERICAN: 49 mL/min — AB (ref >60)
Glucose, Bld: 113 mg/dL — ABNORMAL HIGH (ref 70–99)
POTASSIUM: 3.9 mmol/L (ref 3.5–5.1)
Sodium: 137 mmol/L (ref 135–145)
Total Bilirubin: 0.6 mg/dL (ref 0.3–1.2)
Total Protein: 7.1 g/dL (ref 6.5–8.1)

## 2018-05-27 MED ORDER — DENOSUMAB 60 MG/ML ~~LOC~~ SOSY
60.0000 mg | PREFILLED_SYRINGE | Freq: Once | SUBCUTANEOUS | Status: AC
  Administered 2018-05-27: 60 mg via SUBCUTANEOUS
  Filled 2018-05-27: qty 1

## 2018-05-27 NOTE — Progress Notes (Signed)
Stacie Huang tolerated Prolia injection well without complaints or incident. Calcium 8.8 today and pt denied any tooth or jaw pain and no recent or future dental visits. VSS Pt discharged self ambulatory in satisfactory condition

## 2018-05-27 NOTE — Patient Instructions (Signed)
[facility] at [facility] Discharge Instructions  Received Prolia injection today. Follow-up as scheduled. Call clinic for any questions or concerns   Thank you for choosing [facility] at  [facility] to provide your oncology and hematology care.  To afford each patient quality time with our provider, please arrive at least 15 minutes before your scheduled appointment time.   If you have a lab appointment with the Cancer Center please come in thru the  Main Entrance and check in at the main information desk  You need to re-schedule your appointment should you arrive 10 or more minutes late.  We strive to give you quality time with our providers, and arriving late affects you and other patients whose appointments are after yours.  Also, if you no show three or more times for appointments you may be dismissed from the clinic at the providers discretion.     Again, thank you for choosing [facility].  Our hope is that these requests will decrease the amount of time that you wait before being seen by our physicians.       _____________________________________________________________  Should you have questions after your visit to  Cancer Center, please contact our office at [phone] between the hours of 8:00 a.m. and 4:30 p.m.  Voicemails left after 4:00 p.m. will not be returned until the following business day.  For prescription refill requests, have your pharmacy contact our office and allow 72 hours.    Cancer Center Support Programs:   > Cancer Support Group  2nd Tuesday of the month 1pm-2pm, Journey Room   

## 2018-05-28 ENCOUNTER — Ambulatory Visit: Payer: Medicare Other | Admitting: Cardiology

## 2018-07-07 ENCOUNTER — Ambulatory Visit (INDEPENDENT_AMBULATORY_CARE_PROVIDER_SITE_OTHER): Payer: Medicare Other | Admitting: Cardiology

## 2018-07-07 ENCOUNTER — Ambulatory Visit (INDEPENDENT_AMBULATORY_CARE_PROVIDER_SITE_OTHER): Payer: Medicare Other | Admitting: *Deleted

## 2018-07-07 ENCOUNTER — Other Ambulatory Visit: Payer: Self-pay

## 2018-07-07 ENCOUNTER — Encounter: Payer: Self-pay | Admitting: Cardiology

## 2018-07-07 VITALS — BP 128/64 | HR 86 | Ht 61.0 in | Wt 149.0 lb

## 2018-07-07 DIAGNOSIS — I4891 Unspecified atrial fibrillation: Secondary | ICD-10-CM

## 2018-07-07 DIAGNOSIS — E782 Mixed hyperlipidemia: Secondary | ICD-10-CM

## 2018-07-07 DIAGNOSIS — Z5181 Encounter for therapeutic drug level monitoring: Secondary | ICD-10-CM

## 2018-07-07 DIAGNOSIS — I10 Essential (primary) hypertension: Secondary | ICD-10-CM | POA: Diagnosis not present

## 2018-07-07 LAB — POCT INR: INR: 2.3 (ref 2.0–3.0)

## 2018-07-07 NOTE — Patient Instructions (Signed)
Continue coumadin 1/2 tablet daily except 1 tablet on Tuesdays and Fridays Recheck in 6 weeks  

## 2018-07-07 NOTE — Progress Notes (Signed)
Clinical Summary Stacie Huang is a 78 y.o.female seen today for follow up of the following medical problem.s   1. Afib - not interested in NOACs, remains on coumadin.   - no symptoms. She is compliant with meds   2. HTN - compliant with meds  3. Hyperlipidemia - labs followed by pcp, she is on statin  4. Breast cancer - followed by oncology   Past Medical History:  Diagnosis Date  . Anxiety   . Breast cancer ([place])   . Cancer ([place])   . Chronic anticoagulation   . GERD (gastroesophageal reflux disease)   . Hyperlipidemia   . Hypertension   . Invasive ductal carcinoma of breast, female, right ([place]) 05/08/2016  . Osteopenia determined by x-ray 10/03/2015  . Paroxysmal atrial fibrillation ([facility])      Allergies  Allergen Reactions  . Iohexol Hives  . Sulfa Antibiotics Rash  . Sulfasalazine Rash     Current Outpatient Medications  Medication Sig Dispense Refill  . anastrozole (ARIMIDEX) 1 MG tablet Take 1 tablet (1 mg total) by mouth daily. 90 tablet 4  . atenolol (TENORMIN) 50 MG tablet Take 50 mg by mouth daily before breakfast.     . calcium carbonate (OS-CAL) 600 MG TABS tablet Take 600 mg by mouth 2 (two) times daily with a meal.     . cetirizine (ZYRTEC) 10 MG tablet Take 10 mg by mouth every morning.     . Cholecalciferol (VITAMIN D) 400 UNITS capsule Take 400 Units by mouth 2 (two) times daily.     Marland Kitchen denosumab (PROLIA) 60 MG/ML SOLN injection Inject 60 mg into the skin every 6 (six) months. Administer in upper arm, thigh, or abdomen    . diltiazem (CARDIZEM CD) 120 MG 24 hr capsule TAKE 120 MG BY MOUTH EVERY DAY. 90 capsule 3  . fluticasone (FLONASE) 50 MCG/ACT nasal spray Place 2 sprays into both nostrils at bedtime.     Marland Kitchen LORazepam (ATIVAN) 1 MG tablet Take 1 mg by mouth at bedtime.     Marland Kitchen losartan (COZAAR) 50 MG tablet Take 50 mg by mouth every morning.     . metoCLOPramide (REGLAN) 10 MG tablet Take 1 tablet (10 mg total) by mouth every 8 (eight) hours  as needed. for nausea 120 tablet 1  . pantoprazole (PROTONIX) 40 MG tablet TAKE (1) TABLET BY MOUTH TWICE DAILY. (Patient taking differently: TAKE 40 MG BY MOUTH TWICE DAILY.) 60 tablet 5  . psyllium (METAMUCIL) 58.6 % powder Take 1 packet by mouth daily.     . simvastatin (ZOCOR) 10 MG tablet Take 10 mg by mouth at bedtime.      Marland Kitchen warfarin (COUMADIN) 2.5 MG tablet TAKE 1/2 TABLET BY MOUTH DAILY EXCEPT 1 TABLET ON TUESDAYS AND FRIDAYS. 30 tablet 6   No current facility-administered medications for this visit.      Past Surgical History:  Procedure Laterality Date  . ABDOMINAL HYSTERECTOMY    . AXILLARY LYMPH NODE DISSECTION Left 02/23/2013   Procedure: LEFT AXILLARY LYMPH NODE DISSECTION;  Surgeon: Scherry Ran, MD;  Location: [facility];  Service: General;  Laterality: Left;  . CHOLECYSTECTOMY    . COLONOSCOPY  03/10/2002   RMR: Incomplete colonoscopy (sigmoidoscopy)/Normal rectum/Normal colon to 40 cm.   . ESOPHAGOGASTRODUODENOSCOPY  03/10/2002   BSW:HQPRFF esophagus/small HH/Tiny AVM in antrum, otherwise normal stomach and D1 and D2/Status post passage of the [facility] dilator  . PARTIAL MASTECTOMY WITH AXILLARY SENTINEL LYMPH NODE BIOPSY Left 02/23/2013  Procedure: LEFT PARTIAL MASTECTOMY WITH AXILLARY SIMPLE NODE BIOPSY, LEFT BREAST WIDE EXCISION;  Surgeon: Scherry Ran, MD;  Location: [facility];  Service: General;  Laterality: Left;  . PARTIAL MASTECTOMY WITH NEEDLE LOCALIZATION Right 04/02/2016   Procedure: RIGHT PARTIAL MASTECTOMY AFTER NEEDLE LOCALIZATION;  Surgeon: Aviva Signs, MD;  Location: [facility];  Service: General;  Laterality: Right;  . PORT-A-CATH REMOVAL Right 07/31/2017   Procedure: MINOR REMOVAL PORT-A-CATH;  Surgeon: Aviva Signs, MD;  Location: [facility];  Service: General;  Laterality: Right;  . PORTACATH PLACEMENT Right 04/07/2013  . PORTACATH PLACEMENT Right 04/07/2013   Procedure: INSERTION PORT-A-CATH;  Surgeon: Scherry Ran, MD;  Location: [facility];   Service: General;  Laterality: Right;     Allergies  Allergen Reactions  . Iohexol Hives  . Sulfa Antibiotics Rash  . Sulfasalazine Rash      Family History  Problem Relation Age of Onset  . Heart disease Mother        Also 63 of 9 siblings  . Heart attack Father   . Leukemia Other   . Leukemia Other   . Colon cancer Neg Hx      Social History Ms. Sandy reports that she has never smoked. She has never used smokeless tobacco. Ms. Karrer reports no history of alcohol use.   Review of Systems CONSTITUTIONAL: No weight loss, fever, chills, weakness or fatigue.  HEENT: Eyes: No visual loss, blurred vision, double vision or yellow sclerae.No hearing loss, sneezing, congestion, runny nose or sore throat.  SKIN: No rash or itching.  CARDIOVASCULAR: per hpi RESPIRATORY: No shortness of breath, cough or sputum.  GASTROINTESTINAL: No anorexia, nausea, vomiting or diarrhea. No abdominal pain or blood.  GENITOURINARY: No burning on urination, no polyuria NEUROLOGICAL: No headache, dizziness, syncope, paralysis, ataxia, numbness or tingling in the extremities. No change in bowel or bladder control.  MUSCULOSKELETAL: No muscle, back pain, joint pain or stiffness.  LYMPHATICS: No enlarged nodes. No history of splenectomy.  PSYCHIATRIC: No history of depression or anxiety.  ENDOCRINOLOGIC: No reports of sweating, cold or heat intolerance. No polyuria or polydipsia.  Marland Kitchen   Physical Examination Today's Vitals   07/07/18 1259  BP: 128/64  Pulse: 86  SpO2: 97%  Weight: 149 lb (67.6 kg)  Height: 5\' 1"  (1.549 m)   Body mass index is 28.15 kg/m.  Gen: resting comfortably, no acute distress HEENT: no scleral icterus, pupils equal round and reactive, no palptable cervical adenopathy,  CV: RRR, no mrg/ no jvd Resp: Clear to auscultation bilaterally GI: abdomen is soft, non-tender, non-distended, normal bowel sounds, no hepatosplenomegaly MSK: extremities are warm, no edema.  Skin:  warm, no rash Neuro:  no focal deficits Psych: appropriate affect   Assessment and Plan   1. Afib - no symptoms, she will continue current meds including coumadin - EKG today shows rate controled afib  2. HTN - she is at goal, continue current meds  3. Hyperlipidemia - continue statin, request labs from pcp     Arnoldo Lenis, M.D.

## 2018-07-07 NOTE — Patient Instructions (Signed)

## 2018-07-12 ENCOUNTER — Other Ambulatory Visit (HOSPITAL_COMMUNITY): Payer: Self-pay

## 2018-07-12 DIAGNOSIS — R110 Nausea: Secondary | ICD-10-CM

## 2018-07-12 DIAGNOSIS — Z79811 Long term (current) use of aromatase inhibitors: Secondary | ICD-10-CM

## 2018-07-12 MED ORDER — METOCLOPRAMIDE HCL 10 MG PO TABS: 10.0000 mg | ORAL_TABLET | Freq: Three times a day (TID) | ORAL | 1 refills | Status: DC | PRN

## 2018-07-12 NOTE — Telephone Encounter (Signed)
Reviewed refill request for reglan with verbal order ok to refill Dr. Delton Coombes.

## 2018-08-17 ENCOUNTER — Telehealth: Payer: Self-pay | Admitting: *Deleted

## 2018-08-17 NOTE — Telephone Encounter (Signed)
° °  COVID-19 Pre-Screening Questions:   Do you currently have a fever? (yes = cancel and refer to pcp for e-visit)   no  Have you recently travelled on a cruise, internationally, or to [place], [place], [place], [place], [place], or [place], [place] [employer]) ?  (yes = cancel, stay home, monitor symptoms, and contact pcp or initiate e-visit if symptoms develop)NO  Have you been in contact with someone that is currently pending confirmation of Covid19 testing or has been confirmed to have the Covid19 virus?   (yes = cancel, stay home, away from tested individual, monitor symptoms, and contact pcp or initiate e-visit if symptoms develop)  NO  Are you currently experiencing fatigue or cough?  (yes = pt should be prepared to have a mask placed at the time of their visit).  NO

## 2018-08-18 ENCOUNTER — Ambulatory Visit (INDEPENDENT_AMBULATORY_CARE_PROVIDER_SITE_OTHER): Payer: Medicare Other | Admitting: *Deleted

## 2018-08-18 DIAGNOSIS — I4891 Unspecified atrial fibrillation: Secondary | ICD-10-CM

## 2018-08-18 DIAGNOSIS — Z5181 Encounter for therapeutic drug level monitoring: Secondary | ICD-10-CM

## 2018-08-18 LAB — POCT INR: INR: 2.1 (ref 2.0–3.0)

## 2018-08-18 NOTE — Patient Instructions (Signed)
Continue coumadin 1/2 tablet daily except 1 tablet on Tuesdays and Fridays Recheck in 6 weeks  

## 2018-10-13 ENCOUNTER — Telehealth: Payer: Self-pay | Admitting: *Deleted

## 2018-10-13 NOTE — Telephone Encounter (Signed)

## 2018-10-14 ENCOUNTER — Ambulatory Visit (INDEPENDENT_AMBULATORY_CARE_PROVIDER_SITE_OTHER): Payer: Medicare Other | Admitting: *Deleted

## 2018-10-14 DIAGNOSIS — Z5181 Encounter for therapeutic drug level monitoring: Secondary | ICD-10-CM

## 2018-10-14 DIAGNOSIS — I4891 Unspecified atrial fibrillation: Secondary | ICD-10-CM

## 2018-10-14 LAB — POCT INR: INR: 3 (ref 2.0–3.0)

## 2018-10-14 NOTE — Patient Instructions (Signed)
Continue coumadin 1/2 tablet daily except 1 tablet on Tuesdays and Fridays Recheck in 6 weeks  

## 2018-11-01 ENCOUNTER — Other Ambulatory Visit: Payer: Self-pay | Admitting: Cardiology

## 2018-11-08 ENCOUNTER — Other Ambulatory Visit (HOSPITAL_COMMUNITY): Payer: Self-pay | Admitting: Hematology

## 2018-11-08 DIAGNOSIS — Z79811 Long term (current) use of aromatase inhibitors: Secondary | ICD-10-CM

## 2018-11-08 DIAGNOSIS — R110 Nausea: Secondary | ICD-10-CM

## 2018-11-23 ENCOUNTER — Other Ambulatory Visit (HOSPITAL_COMMUNITY): Payer: Self-pay | Admitting: *Deleted

## 2018-11-23 DIAGNOSIS — C50212 Malignant neoplasm of upper-inner quadrant of left female breast: Secondary | ICD-10-CM

## 2018-11-23 DIAGNOSIS — C50911 Malignant neoplasm of unspecified site of right female breast: Secondary | ICD-10-CM

## 2018-11-23 DIAGNOSIS — Z170 Estrogen receptor positive status [ER+]: Secondary | ICD-10-CM

## 2018-11-23 DIAGNOSIS — M8580 Other specified disorders of bone density and structure, unspecified site: Secondary | ICD-10-CM

## 2018-11-24 ENCOUNTER — Inpatient Hospital Stay (HOSPITAL_COMMUNITY): Payer: Medicare Other | Attending: Hematology

## 2018-11-24 ENCOUNTER — Encounter (HOSPITAL_COMMUNITY): Payer: Self-pay | Admitting: Hematology

## 2018-11-24 ENCOUNTER — Inpatient Hospital Stay (HOSPITAL_COMMUNITY): Payer: Medicare Other

## 2018-11-24 ENCOUNTER — Inpatient Hospital Stay (HOSPITAL_BASED_OUTPATIENT_CLINIC_OR_DEPARTMENT_OTHER): Payer: Medicare Other | Admitting: Hematology

## 2018-11-24 ENCOUNTER — Other Ambulatory Visit: Payer: Self-pay

## 2018-11-24 ENCOUNTER — Other Ambulatory Visit (HOSPITAL_COMMUNITY): Payer: Self-pay | Admitting: Hematology

## 2018-11-24 VITALS — BP 121/62 | HR 85 | Temp 97.8°F | Resp 16 | Wt 145.9 lb

## 2018-11-24 DIAGNOSIS — Z170 Estrogen receptor positive status [ER+]: Secondary | ICD-10-CM

## 2018-11-24 DIAGNOSIS — M8580 Other specified disorders of bone density and structure, unspecified site: Secondary | ICD-10-CM

## 2018-11-24 DIAGNOSIS — C50911 Malignant neoplasm of unspecified site of right female breast: Secondary | ICD-10-CM

## 2018-11-24 DIAGNOSIS — Z79811 Long term (current) use of aromatase inhibitors: Secondary | ICD-10-CM

## 2018-11-24 DIAGNOSIS — C50212 Malignant neoplasm of upper-inner quadrant of left female breast: Secondary | ICD-10-CM | POA: Insufficient documentation

## 2018-11-24 DIAGNOSIS — Z79899 Other long term (current) drug therapy: Secondary | ICD-10-CM

## 2018-11-24 DIAGNOSIS — C50411 Malignant neoplasm of upper-outer quadrant of right female breast: Secondary | ICD-10-CM | POA: Diagnosis not present

## 2018-11-24 LAB — COMPREHENSIVE METABOLIC PANEL
ALT: 24 U/L (ref 0–44)
AST: 26 U/L (ref 15–41)
Albumin: 4 g/dL (ref 3.5–5.0)
Alkaline Phosphatase: 39 U/L (ref 38–126)
Anion gap: 9 (ref 5–15)
BUN: 16 mg/dL (ref 8–23)
CO2: 23 mmol/L (ref 22–32)
Calcium: 9.2 mg/dL (ref 8.9–10.3)
Chloride: 107 mmol/L (ref 98–111)
Creatinine, Ser: 1.24 mg/dL — ABNORMAL HIGH (ref 0.44–1.00)
GFR calc Af Amer: 48 mL/min — ABNORMAL LOW (ref >60)
GFR calc non Af Amer: 41 mL/min — ABNORMAL LOW (ref >60)
Glucose, Bld: 100 mg/dL — ABNORMAL HIGH (ref 70–99)
Potassium: 4.3 mmol/L (ref 3.5–5.1)
Sodium: 139 mmol/L (ref 135–145)
Total Bilirubin: 0.6 mg/dL (ref 0.3–1.2)
Total Protein: 6.7 g/dL (ref 6.5–8.1)

## 2018-11-24 LAB — CBC WITH DIFFERENTIAL/PLATELET
Abs Immature Granulocytes: 0.06 10*3/uL (ref 0.00–0.07)
Basophils Absolute: 0.1 10*3/uL (ref 0.0–0.1)
Basophils Relative: 1 %
Eosinophils Absolute: 0.1 10*3/uL (ref 0.0–0.5)
Eosinophils Relative: 2 %
HCT: 39.9 % (ref 36.0–46.0)
Hemoglobin: 12.6 g/dL (ref 12.0–15.0)
Immature Granulocytes: 1 %
Lymphocytes Relative: 37 %
Lymphs Abs: 2.6 10*3/uL (ref 0.7–4.0)
MCH: 30.7 pg (ref 26.0–34.0)
MCHC: 31.6 g/dL (ref 30.0–36.0)
MCV: 97.1 fL (ref 80.0–100.0)
Monocytes Absolute: 0.8 10*3/uL (ref 0.1–1.0)
Monocytes Relative: 11 %
Neutro Abs: 3.5 10*3/uL (ref 1.7–7.7)
Neutrophils Relative %: 48 %
Platelets: 244 10*3/uL (ref 150–400)
RBC: 4.11 MIL/uL (ref 3.87–5.11)
RDW: 13.7 % (ref 11.5–15.5)
WBC: 7.1 10*3/uL (ref 4.0–10.5)
nRBC: 0 % (ref 0.0–0.2)

## 2018-11-24 LAB — LACTATE DEHYDROGENASE: LDH: 129 U/L (ref 98–192)

## 2018-11-24 MED ORDER — SODIUM CHLORIDE 0.9 % IV SOLN: Freq: Once | INTRAVENOUS | Status: DC

## 2018-11-24 MED ORDER — DENOSUMAB 60 MG/ML ~~LOC~~ SOSY
60.0000 mg | PREFILLED_SYRINGE | Freq: Once | SUBCUTANEOUS | Status: AC
  Administered 2018-11-24: 60 mg via SUBCUTANEOUS
  Filled 2018-11-24: qty 1

## 2018-11-24 NOTE — Progress Notes (Signed)
Pt is taking Arimidex as prescribed with no side effects. 

## 2018-11-24 NOTE — Progress Notes (Signed)
Patient tolerated injection with no complaints voiced.  Site clean and dry with no bruising or swelling noted at site.  Band aid applied.  Vss with discharge and left ambulatory with no s/s of distress noted.  

## 2018-11-24 NOTE — Patient Instructions (Signed)
[facility]  Discharge Instructions:  You saw Renee Nester, NP, today. _______________________________________________________________  Thank you for choosing [facility] at [facility] to provide your oncology and hematology care.  To afford each patient quality time with our providers, please arrive at least 15 minutes before your scheduled appointment.  You need to re-schedule your appointment if you arrive 10 or more minutes late.  We strive to give you quality time with our providers, and arriving late affects you and other patients whose appointments are after yours.  Also, if you no show three or more times for appointments you may be dismissed from the clinic.  Again, thank you for choosing [facility] at [facility]. Our hope is that these requests will allow you access to exceptional care and in a timely manner. _______________________________________________________________  If you have questions after your visit, please contact our office at [phone] between the hours of 8:30 a.m. and 5:00 p.m. Voicemails left after 4:30 p.m. will not be returned until the following business day. _______________________________________________________________  For prescription refill requests, have your pharmacy contact our office. _______________________________________________________________  Recommendations made by the consultant and any test results will be sent to your referring physician. _______________________________________________________________ 

## 2018-11-25 NOTE — Assessment & Plan Note (Signed)
1.  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 - Upper-inner quadrant, initially diagnosed in 01/2013.    - She was treated with definitive left lumptectomy upfront, followed by Taxol/Herceptin x 12 weekly cycles, Taxol was discontinued after 2 cycles due to toxicities.   - Herceptin was continued x 52 weeks and she was started on anti-endocrine therapy beginning in 06/2013.   - She did not receive adjuvant XRT.   She has been on Arimidex since 06/2013.   - Bilateral diagnostic mammogram done 02/2018 reviewed and was negative.  -She will continue on Arimidex daily.  Her next mammogram will be scheduled for November 2020.   - She will return to clinic in 6 months.  2.  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   - She is S/P right lumpectomy - Mammaprint was completed and she was identified to have LOW risk disease.  Thus, no adjuvant chemotherapy was recommended.   - She did undergo adjuvant XRT completed on 07/04/2016   3.  Osteopenia in the setting of AI therapy.   - Prolia started on 10/08/2015 in combination with Ca++ and Vit D.  -  Bone density done 03/02/2018 reviewed and showed osteopenia.   - Pt will continue Prolia every 6 months.  Continue calcium and vitamin D.

## 2018-11-25 NOTE — Progress Notes (Signed)
Stacie Huang, [place] [address]   CLINIC:  Medical Oncology/Hematology  PCP:  Lemmie Evens, MD [place] [address] [phone]   REASON FOR VISIT:  Follow-up for Breast Cancer  CURRENT THERAPY: Arimidex  BRIEF ONCOLOGIC HISTORY:  Oncology History  Malignant neoplasm of right female breast ([place])  02/04/2013 Initial Diagnosis   Breast cancer of upper-inner quadrant of left female breast   02/23/2013 Surgery   left lumpectomy (UIQ), sentinel node biopsy--0.7cm, Node negative, ER+, PR+, HER-2/neu over expressed.   04/08/2013 - 04/14/2013 Chemotherapy   Paclitaxel/Herceptin weekly x 12. Intolerance to Paclitaxel and only receiving 2 cycles   05/04/2013 - 04/05/2014 Chemotherapy   Herceptin every 21 days    06/28/2013 -  Chemotherapy   Anastrazole started   08/02/2013 Imaging   Bone density- normal   04/06/2014 Imaging   MUGA- Left ventricular ejection fraction equals 53%.   08/06/2015 Imaging   Bone density- osteopenia   03/06/2016 Procedure   Stereotactic-guided biopsy of right breast upper outer quadrant focal asymmetry/calcifications. No apparent complications.   03/06/2016 Procedure   Successful placement of coil shaped marker within the right breast upper outer quadrant biopsy site, post stereotactic core needle biopsy.   Invasive ductal carcinoma of breast, female, right ([place])  02/26/2016 Mammogram   1. Focal asymmetry within the upper-outer quadrant of the right breast, with associated new calcifications. This is a suspicious finding for which stereotactic biopsy with 3D tomosynthesis guidance is recommended. 2. Cluster of cysts at the 10 o'clock axis, 5 cm from the nipple, measuring 1.2 x 0.6 x 1.1 cm, a possible correlate for the mammographic asymmetry.   03/06/2016 Procedure   Right needle core biopsy   03/07/2016 Pathology Results   Breast, right, needle core biopsy, upper outer quadrant - HIGH  GRADE DUCTAL CARCINOMA IN SITU WITH NECROSIS AND ASSOCIATED CALCIFICATIONS.IMMUNOHISTOCHEMICAL AND MORPHOMETRIC ANALYSIS PERFORMED MANUALLY Estrogen Receptor: 0%, NEGATIVE Progesterone Receptor: 0%, NEGATIVE   04/02/2016 Procedure   Breast, lumpectomy, right by Dr. Arnoldo Morale   04/07/2016 Pathology Results   INVASIVE DUCTAL CARCINOMA, GRADE 2, SPANNING 0.6 CM THE CARCINOMA IS FOCALLY PRESENTED AT THE CAUTERIZED MEDIAL MARGIN EXTENSIVE DUCTAL CARCINOMA IN SITU, GRADE 3 ALL OTHER SURGICAL MARGIN ARE NEGATIVE FOR CARCINOMA ER 0% PR 0% Ki-67 15% HER2 NEGATIVE   05/05/2016 Pathology Results   MammaPrint: LOW RISK (97.8% of low risk Mammaprint patients who were treated with anti-hormonal therapy alone are living without distant recurrence of breast cancer at 5-years).   06/02/2016 - 07/04/2016 Radiation Therapy   XRT in [place], [place].  Right breast 42.56 Gy delivered in 16 fractions at 2.65 Gy per fraction and a right tumor bed boost 16 Gy delivered in 8 fractions at 2 Gy per fraction for a total dose of 58.56 Gy completed on 07/04/2016.      CANCER STAGING: Cancer Staging Invasive ductal carcinoma of breast, female, right [facility]) Staging form: Breast, AJCC 8th Edition - Pathologic stage from 05/08/2016: Stage IIA (pT1b, pN0, cM0, G2, ER: Negative, PR: Negative, HER2: Negative) - Signed by Baird Cancer, PA-C on 05/08/2016  Malignant neoplasm of right female breast [facility]) Staging form: Breast, AJCC 7th Edition - Clinical stage from 03/16/2013: Stage IA (T1b, N0, cM0, Free text: stage I) - Unsigned - Pathologic: Stage IA (T1b, N0, cM0) - Signed by Farrel Gobble, MD on 03/17/2013    INTERVAL HISTORY:  Stacie Huang 78 y.o. female presents today for follow-up.  Reports overall doing well.  She denies any changes to her breasts no new lumps, masses, nipple inversion, or nipple discharge.  She continues on Arimidex daily, tolerating well.  She denies any recent hospitalizations.  Denies any fevers,  chills, night sweats.  Appetite is stable.  No weight loss.   REVIEW OF SYSTEMS:  Review of Systems  Constitutional: Negative.   HENT:  Negative.   Eyes: Negative.   Respiratory: Negative.   Cardiovascular: Negative.   Gastrointestinal: Negative.   Endocrine: Negative.   Genitourinary: Negative.    Musculoskeletal: Positive for arthralgias.  Skin: Negative.   Neurological: Negative.   Hematological: Negative.   Psychiatric/Behavioral: Negative.      PAST MEDICAL/SURGICAL HISTORY:  Past Medical History:  Diagnosis Date   Anxiety    Breast cancer ([place])    Cancer ([place])    Chronic anticoagulation    GERD (gastroesophageal reflux disease)    Hyperlipidemia    Hypertension    Invasive ductal carcinoma of breast, female, right ([place]) 05/08/2016   Osteopenia determined by x-ray 10/03/2015   Paroxysmal atrial fibrillation ([place])    Past Surgical History:  Procedure Laterality Date   ABDOMINAL HYSTERECTOMY     AXILLARY LYMPH NODE DISSECTION Left 02/23/2013   Procedure: LEFT AXILLARY LYMPH NODE DISSECTION;  Surgeon: Scherry Ran, MD;  Location: [facility];  Service: General;  Laterality: Left;   CHOLECYSTECTOMY     COLONOSCOPY  03/10/2002   RMR: Incomplete colonoscopy (sigmoidoscopy)/Normal rectum/Normal colon to 40 cm.    ESOPHAGOGASTRODUODENOSCOPY  03/10/2002   TMA:UQJFHL esophagus/small HH/Tiny AVM in antrum, otherwise normal stomach and D1 and D2/Status post passage of the [facility] dilator   PARTIAL MASTECTOMY WITH AXILLARY SENTINEL LYMPH NODE BIOPSY Left 02/23/2013   Procedure: LEFT PARTIAL MASTECTOMY WITH AXILLARY SIMPLE NODE BIOPSY, LEFT BREAST WIDE EXCISION;  Surgeon: Scherry Ran, MD;  Location: [facility];  Service: General;  Laterality: Left;   PARTIAL MASTECTOMY WITH NEEDLE LOCALIZATION Right 04/02/2016   Procedure: RIGHT PARTIAL MASTECTOMY AFTER NEEDLE LOCALIZATION;  Surgeon: Aviva Signs, MD;  Location: [facility];  Service: General;  Laterality: Right;     PORT-A-CATH REMOVAL Right 07/31/2017   Procedure: MINOR REMOVAL PORT-A-CATH;  Surgeon: Aviva Signs, MD;  Location: [facility];  Service: General;  Laterality: Right;   PORTACATH PLACEMENT Right 04/07/2013   PORTACATH PLACEMENT Right 04/07/2013   Procedure: INSERTION PORT-A-CATH;  Surgeon: Scherry Ran, MD;  Location: [facility];  Service: General;  Laterality: Right;     SOCIAL HISTORY:  Social History   Socioeconomic History   Marital status: Legally Separated    Spouse name: Not on file   Number of children:  2   Years of education: Not on file   Highest education level: Not on file  Occupational History   Occupation: Retired from [employer] resource strain: Not on file   Food insecurity    Worry: Not on file    Inability: Not on file   Transportation needs    Medical: Not on file    Non-medical: Not on file  Tobacco Use   Smoking status: Never Smoker   Smokeless tobacco: Never Used  Substance and Sexual Activity   Alcohol use: No   Drug use: No   Sexual activity: Yes    Birth control/protection: Surgical  Lifestyle   Physical activity    Days per week: Not on file    Minutes per session: Not on file   Stress: Not on file  Relationships  Social Herbalist on phone: Not on file    Gets together: Not on file    Attends religious service: Not on file    Active member of club or organization: Not on file    Attends meetings of clubs or organizations: Not on file    Relationship status: Not on file   Intimate partner violence    Fear of current or ex partner: Not on file    Emotionally abused: Not on file    Physically abused: Not on file    Forced sexual activity: Not on file  Other Topics Concern   Not on file  Social History Narrative   2 adult children    FAMILY HISTORY:  Family History  Problem Relation Age of Onset   Heart disease Mother        Also 78 of 9 siblings   Heart  attack Father    Leukemia Other    Leukemia Other    Colon cancer Neg Hx     CURRENT MEDICATIONS:  Outpatient Encounter Medications as of 11/24/2018  Medication Sig   anastrozole (ARIMIDEX) 1 MG tablet Take 1 tablet (1 mg total) by mouth daily.   atenolol (TENORMIN) 50 MG tablet Take 50 mg by mouth daily before breakfast.    calcium carbonate (OS-CAL) 600 MG TABS tablet Take 600 mg by mouth 2 (two) times daily with a meal.    cetirizine (ZYRTEC) 10 MG tablet Take 10 mg by mouth every morning.    Cholecalciferol (VITAMIN D) 400 UNITS capsule Take 400 Units by mouth 2 (two) times daily.    denosumab (PROLIA) 60 MG/ML SOLN injection Inject 60 mg into the skin every 6 (six) months. Administer in upper arm, thigh, or abdomen   diltiazem (CARDIZEM CD) 120 MG 24 hr capsule TAKE 1 CAPSULE BY MOUTH EVERY DAY.   fluticasone (FLONASE) 50 MCG/ACT nasal spray Place 2 sprays into both nostrils at bedtime.    LORazepam (ATIVAN) 1 MG tablet Take 1 mg by mouth at bedtime.    losartan (COZAAR) 50 MG tablet Take 50 mg by mouth every morning.    metoCLOPramide (REGLAN) 10 MG tablet TAKE 1 TABLET BY MOUTH EVERY 8 HOURS AS NEEDED FOR NAUSEA.   pantoprazole (PROTONIX) 40 MG tablet TAKE (1) TABLET BY MOUTH TWICE DAILY. (Patient taking differently: TAKE 40 MG BY MOUTH TWICE DAILY.)   psyllium (METAMUCIL) 58.6 % powder Take 1 packet by mouth daily.    simvastatin (ZOCOR) 10 MG tablet Take 10 mg by mouth at bedtime.     warfarin (COUMADIN) 2.5 MG tablet TAKE 1/2 TABLET BY MOUTH DAILY EXCEPT 1 TABLET ON TUESDAYS AND FRIDAYS.   No facility-administered encounter medications on file as of 11/24/2018.     ALLERGIES:  Allergies  Allergen Reactions   Iohexol Hives   Sulfa Antibiotics Rash   Sulfasalazine Rash     PHYSICAL EXAM:  ECOG Performance status: 1  Vitals:   11/24/18 1430  BP: 121/62  Pulse: 85  Resp: 16  Temp: 97.8 F (36.6 C)  SpO2: 98%   Filed Weights   11/24/18 1430   Weight: 145 lb 14.4 oz (66.2 kg)    Physical Exam Constitutional:      Appearance: Normal appearance.  HENT:     Head: Normocephalic.     Nose: Nose normal.     Mouth/Throat:     Pharynx: Oropharynx is clear.  Eyes:     Extraocular Movements: Extraocular movements intact.  Conjunctiva/sclera: Conjunctivae normal.  Neck:     Musculoskeletal: Normal range of motion.  Cardiovascular:     Rate and Rhythm: Normal rate and regular rhythm.     Pulses: Normal pulses.     Heart sounds: Normal heart sounds.  Pulmonary:     Effort: Pulmonary effort is normal.     Breath sounds: Normal breath sounds.  Abdominal:     General: Bowel sounds are normal.     Palpations: Abdomen is soft.  Musculoskeletal: Normal range of motion.  Skin:    General: Skin is warm and dry.  Neurological:     General: No focal deficit present.     Mental Status: She is alert and oriented to person, place, and time.  Psychiatric:        Mood and Affect: Mood normal.        Behavior: Behavior normal.        Thought Content: Thought content normal.        Judgment: Judgment normal.      LABORATORY DATA:  I have reviewed the labs as listed.  CBC    Component Value Date/Time   WBC 7.1 11/24/2018 1353   RBC 4.11 11/24/2018 1353   HGB 12.6 11/24/2018 1353   HCT 39.9 11/24/2018 1353   PLT 244 11/24/2018 1353   MCV 97.1 11/24/2018 1353   MCH 30.7 11/24/2018 1353   MCHC 31.6 11/24/2018 1353   RDW 13.7 11/24/2018 1353   LYMPHSABS 2.6 11/24/2018 1353   MONOABS 0.8 11/24/2018 1353   EOSABS 0.1 11/24/2018 1353   BASOSABS 0.1 11/24/2018 1353   CMP Latest Ref Rng & Units 11/24/2018 05/27/2018 11/24/2017  Glucose 70 - 99 mg/dL 100(H) 113(H) 128(H)  BUN 8 - 23 mg/dL '16 15 13  ' Creatinine 0.44 - 1.00 mg/dL 1.24(H) 1.08(H) 0.99  Sodium 135 - 145 mmol/L 139 137 138  Potassium 3.5 - 5.1 mmol/L 4.3 3.9 4.1  Chloride 98 - 111 mmol/L 107 104 105  CO2 22 - 32 mmol/L '23 23 24  ' Calcium 8.9 - 10.3 mg/dL 9.2 8.8(L)  9.3  Total Protein 6.5 - 8.1 g/dL 6.7 7.1 7.1  Total Bilirubin 0.3 - 1.2 mg/dL 0.6 0.6 0.8  Alkaline Phos 38 - 126 U/L 39 45 42  AST 15 - 41 U/L '26 25 25  ' ALT 0 - 44 U/L '24 27 25          ' ASSESSMENT & PLAN:   Malignant neoplasm of right female breast ([facility]) 1.  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 - Upper-inner quadrant, initially diagnosed in 01/2013.    - She was treated with definitive left lumptectomy upfront, followed by Taxol/Herceptin x 12 weekly cycles, Taxol was discontinued after 2 cycles due to toxicities.   - Herceptin was continued x 52 weeks and she was started on anti-endocrine therapy beginning in 06/2013.   - She did not receive adjuvant XRT.   She has been on Arimidex since 06/2013.   - Bilateral diagnostic mammogram done 02/2018 reviewed and was negative.  -She will continue on Arimidex daily.  Her next mammogram will be scheduled for November 2020.   - She will return to clinic in 6 months.  2.  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   - She is S/P right lumpectomy - Mammaprint was completed and she was identified to have LOW risk disease.  Thus, no adjuvant chemotherapy was recommended.   -  She did undergo adjuvant XRT completed on 07/04/2016   3.  Osteopenia in the setting of AI therapy.   - Prolia started on 10/08/2015 in combination with Ca++ and Vit D.  -  Bone density done 03/02/2018 reviewed and showed osteopenia.   - Pt will continue Prolia every 6 months.  Continue calcium and vitamin D.       Orders placed this encounter:  Orders Placed This Encounter  Procedures   MM Digital Diagnostic Bilat   CBC with Differential   Comprehensive metabolic panel   Vitamin D 25 hydroxy     Roger Shelter, [place] [phone]

## 2018-11-30 ENCOUNTER — Ambulatory Visit (INDEPENDENT_AMBULATORY_CARE_PROVIDER_SITE_OTHER): Payer: Medicare Other | Admitting: *Deleted

## 2018-11-30 ENCOUNTER — Other Ambulatory Visit: Payer: Self-pay

## 2018-11-30 DIAGNOSIS — I4891 Unspecified atrial fibrillation: Secondary | ICD-10-CM

## 2018-11-30 DIAGNOSIS — Z5181 Encounter for therapeutic drug level monitoring: Secondary | ICD-10-CM

## 2018-11-30 LAB — POCT INR: INR: 2.6 (ref 2.0–3.0)

## 2018-11-30 NOTE — Patient Instructions (Signed)
Continue coumadin 1/2 tablet daily except 1 tablet on Tuesdays and Fridays Recheck in 6 weeks  

## 2019-01-08 ENCOUNTER — Other Ambulatory Visit (HOSPITAL_COMMUNITY): Payer: Self-pay | Admitting: Hematology

## 2019-01-08 DIAGNOSIS — Z79811 Long term (current) use of aromatase inhibitors: Secondary | ICD-10-CM

## 2019-01-08 DIAGNOSIS — R110 Nausea: Secondary | ICD-10-CM

## 2019-01-12 ENCOUNTER — Other Ambulatory Visit: Payer: Self-pay

## 2019-01-12 ENCOUNTER — Ambulatory Visit (INDEPENDENT_AMBULATORY_CARE_PROVIDER_SITE_OTHER): Payer: Medicare Other | Admitting: *Deleted

## 2019-01-12 DIAGNOSIS — Z5181 Encounter for therapeutic drug level monitoring: Secondary | ICD-10-CM | POA: Diagnosis not present

## 2019-01-12 DIAGNOSIS — I4891 Unspecified atrial fibrillation: Secondary | ICD-10-CM

## 2019-01-12 LAB — POCT INR: INR: 2.6 (ref 2.0–3.0)

## 2019-01-12 NOTE — Patient Instructions (Signed)
Continue coumadin 1/2 tablet daily except 1 tablet on Tuesdays and Fridays Recheck in 6 weeks  

## 2019-02-01 ENCOUNTER — Other Ambulatory Visit: Payer: Self-pay | Admitting: Cardiology

## 2019-02-23 ENCOUNTER — Ambulatory Visit (INDEPENDENT_AMBULATORY_CARE_PROVIDER_SITE_OTHER): Payer: Medicare Other | Admitting: *Deleted

## 2019-02-23 ENCOUNTER — Other Ambulatory Visit: Payer: Self-pay

## 2019-02-23 DIAGNOSIS — I4891 Unspecified atrial fibrillation: Secondary | ICD-10-CM

## 2019-02-23 DIAGNOSIS — Z5181 Encounter for therapeutic drug level monitoring: Secondary | ICD-10-CM | POA: Diagnosis not present

## 2019-02-23 LAB — POCT INR: INR: 2.5 (ref 2.0–3.0)

## 2019-02-23 NOTE — Patient Instructions (Signed)
Continue coumadin 1/2 tablet daily except 1 tablet on Tuesdays and Fridays  Recheck in 7 weeks.   

## 2019-03-08 ENCOUNTER — Other Ambulatory Visit (HOSPITAL_COMMUNITY): Payer: Medicare Other

## 2019-03-08 ENCOUNTER — Ambulatory Visit (HOSPITAL_COMMUNITY)
Admission: RE | Admit: 2019-03-08 | Discharge: 2019-03-08 | Disposition: A | Discharge status: Home / Self Care | Payer: Medicare Other | Source: Ambulatory Visit | Attending: Hematology | Admitting: Hematology

## 2019-03-08 ENCOUNTER — Ambulatory Visit (HOSPITAL_COMMUNITY): Admission: RE | Admit: 2019-03-08 | Payer: Medicare Other | Source: Ambulatory Visit

## 2019-03-08 ENCOUNTER — Other Ambulatory Visit: Payer: Self-pay

## 2019-03-08 DIAGNOSIS — C50911 Malignant neoplasm of unspecified site of right female breast: Secondary | ICD-10-CM

## 2019-03-08 DIAGNOSIS — Z853 Personal history of malignant neoplasm of breast: Secondary | ICD-10-CM | POA: Diagnosis present

## 2019-03-08 DIAGNOSIS — Z170 Estrogen receptor positive status [ER+]: Secondary | ICD-10-CM

## 2019-03-10 ENCOUNTER — Other Ambulatory Visit (HOSPITAL_COMMUNITY): Payer: Self-pay | Admitting: Hematology

## 2019-03-10 DIAGNOSIS — Z79811 Long term (current) use of aromatase inhibitors: Secondary | ICD-10-CM

## 2019-03-10 DIAGNOSIS — R110 Nausea: Secondary | ICD-10-CM

## 2019-03-22 ENCOUNTER — Other Ambulatory Visit: Payer: Self-pay | Admitting: Cardiology

## 2019-03-30 ENCOUNTER — Other Ambulatory Visit: Payer: Self-pay

## 2019-03-30 ENCOUNTER — Ambulatory Visit (HOSPITAL_COMMUNITY)
Admission: RE | Admit: 2019-03-30 | Discharge: 2019-03-30 | Disposition: A | Discharge status: Home / Self Care | Payer: Medicare Other | Source: Ambulatory Visit | Attending: Nurse Practitioner | Admitting: Nurse Practitioner

## 2019-03-30 ENCOUNTER — Other Ambulatory Visit (HOSPITAL_COMMUNITY): Payer: Self-pay | Admitting: Nurse Practitioner

## 2019-03-30 DIAGNOSIS — M545 Low back pain, unspecified: Secondary | ICD-10-CM

## 2019-04-28 ENCOUNTER — Ambulatory Visit (INDEPENDENT_AMBULATORY_CARE_PROVIDER_SITE_OTHER): Payer: Medicare Other | Admitting: *Deleted

## 2019-04-28 ENCOUNTER — Other Ambulatory Visit: Payer: Self-pay

## 2019-04-28 DIAGNOSIS — I4891 Unspecified atrial fibrillation: Secondary | ICD-10-CM | POA: Diagnosis not present

## 2019-04-28 DIAGNOSIS — Z5181 Encounter for therapeutic drug level monitoring: Secondary | ICD-10-CM | POA: Diagnosis not present

## 2019-04-28 LAB — POCT INR: INR: 2.1 (ref 2.0–3.0)

## 2019-04-28 NOTE — Patient Instructions (Signed)
Continue coumadin 1/2 tablet daily except 1 tablet on Tuesdays and Fridays  Recheck in 7 weeks.   

## 2019-05-09 ENCOUNTER — Other Ambulatory Visit (HOSPITAL_COMMUNITY): Payer: Self-pay | Admitting: Hematology

## 2019-05-09 DIAGNOSIS — R110 Nausea: Secondary | ICD-10-CM

## 2019-05-09 DIAGNOSIS — Z79811 Long term (current) use of aromatase inhibitors: Secondary | ICD-10-CM

## 2019-05-30 ENCOUNTER — Other Ambulatory Visit (HOSPITAL_COMMUNITY): Payer: Self-pay | Admitting: *Deleted

## 2019-05-30 ENCOUNTER — Inpatient Hospital Stay (HOSPITAL_COMMUNITY): Payer: Medicare Other | Attending: Hematology

## 2019-05-30 ENCOUNTER — Ambulatory Visit (HOSPITAL_COMMUNITY): Payer: Medicare Other

## 2019-05-30 ENCOUNTER — Other Ambulatory Visit (HOSPITAL_COMMUNITY): Payer: Medicare Other

## 2019-05-30 ENCOUNTER — Ambulatory Visit (HOSPITAL_COMMUNITY): Payer: Medicare Other | Admitting: Hematology

## 2019-05-30 ENCOUNTER — Other Ambulatory Visit: Payer: Self-pay

## 2019-05-30 DIAGNOSIS — M8580 Other specified disorders of bone density and structure, unspecified site: Secondary | ICD-10-CM | POA: Diagnosis present

## 2019-05-30 LAB — COMPREHENSIVE METABOLIC PANEL
ALT: 21 U/L (ref 0–44)
AST: 22 U/L (ref 15–41)
Albumin: 4.1 g/dL (ref 3.5–5.0)
Alkaline Phosphatase: 45 U/L (ref 38–126)
Anion gap: 9 (ref 5–15)
BUN: 16 mg/dL (ref 8–23)
CO2: 27 mmol/L (ref 22–32)
Calcium: 9.5 mg/dL (ref 8.9–10.3)
Chloride: 104 mmol/L (ref 98–111)
Creatinine, Ser: 0.99 mg/dL (ref 0.44–1.00)
GFR calc Af Amer: >60 mL/min (ref >60)
GFR calc non Af Amer: 54 mL/min — ABNORMAL LOW (ref >60)
Glucose, Bld: 95 mg/dL (ref 70–99)
Potassium: 4.2 mmol/L (ref 3.5–5.1)
Sodium: 140 mmol/L (ref 135–145)
Total Bilirubin: 0.9 mg/dL (ref 0.3–1.2)
Total Protein: 6.9 g/dL (ref 6.5–8.1)

## 2019-05-31 ENCOUNTER — Other Ambulatory Visit (HOSPITAL_COMMUNITY): Payer: Self-pay | Admitting: Internal Medicine

## 2019-06-01 ENCOUNTER — Inpatient Hospital Stay (HOSPITAL_BASED_OUTPATIENT_CLINIC_OR_DEPARTMENT_OTHER): Payer: Medicare Other | Admitting: Nurse Practitioner

## 2019-06-01 ENCOUNTER — Encounter (HOSPITAL_COMMUNITY): Payer: Self-pay | Admitting: Nurse Practitioner

## 2019-06-01 ENCOUNTER — Other Ambulatory Visit: Payer: Self-pay

## 2019-06-01 ENCOUNTER — Inpatient Hospital Stay (HOSPITAL_COMMUNITY): Payer: Medicare Other

## 2019-06-01 DIAGNOSIS — M8580 Other specified disorders of bone density and structure, unspecified site: Secondary | ICD-10-CM

## 2019-06-01 DIAGNOSIS — Z79899 Other long term (current) drug therapy: Secondary | ICD-10-CM

## 2019-06-01 DIAGNOSIS — C50911 Malignant neoplasm of unspecified site of right female breast: Secondary | ICD-10-CM

## 2019-06-01 DIAGNOSIS — Z170 Estrogen receptor positive status [ER+]: Secondary | ICD-10-CM

## 2019-06-01 MED ORDER — DENOSUMAB 60 MG/ML ~~LOC~~ SOSY
60.0000 mg | PREFILLED_SYRINGE | Freq: Once | SUBCUTANEOUS | Status: AC
  Administered 2019-06-01: 14:00:00 60 mg via SUBCUTANEOUS

## 2019-06-01 MED ORDER — ANASTROZOLE 1 MG PO TABS: 1.0000 mg | ORAL_TABLET | Freq: Every day | ORAL | 5 refills | Status: DC

## 2019-06-01 MED ORDER — DENOSUMAB 60 MG/ML ~~LOC~~ SOSY
PREFILLED_SYRINGE | SUBCUTANEOUS | Status: AC
  Filled 2019-06-01: qty 1

## 2019-06-01 NOTE — Assessment & Plan Note (Addendum)
1.  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  ER ER positive/PR positive/HER-2 positive. -She was treated with an definite left lumpectomy upfront, followed by Taxol/Herceptin x12 weekly cycles, Taxol was discontinued after 2 cycles due to toxicities. -Herceptin was continued x52 weeks and she was started on antiendocrine therapy beginning 06/2013. -She did not receive any adjuvant XRT.  She has been on Arimidex since 06/2013. -Bilateral diagnostic mammogram done on 03/08/2019 was BI-RADS Category 2 benign. -Labs done on 05/30/2019 were all WNL -She will continue on Arimidex daily. She will complete 10 years.  -She will follow up in 8 months with repeat labs and mammogram.  2.  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  ER negative/PR negative/HER-2 negative -She is status post right lumpectomy -MammaPrint was completed and she was identified to have a low risk disease.  Thus no adjuvant chemotherapy was recommended. -She did undergo adjuvant XRT completed on 07/04/2016.  3.  Osteopenia: -This is in the setting of AI therapy. -Prolia started on 10/08/2015 and combination with calcium and vitamin D. -Bone density done on 03/02/2018 was reviewed and showed osteopenia. -Prolia will be continued every 6 months.  She will also continue calcium and vitamin D daily.

## 2019-06-01 NOTE — Progress Notes (Signed)
Stacie Huang, Stacie Huang   CLINIC:  Medical Oncology/Hematology  PCP:  Stacie Evens, MD [place] [address] [phone]   REASON FOR VISIT: Follow-up for breast cancer  CURRENT THERAPY: Arimidex  BRIEF ONCOLOGIC HISTORY:  Oncology History  Malignant neoplasm of right female breast ([place])  02/04/2013 Initial Diagnosis   Breast cancer of upper-inner quadrant of left female breast   02/23/2013 Surgery   left lumpectomy (UIQ), sentinel node biopsy--0.7cm, Node negative, ER+, PR+, HER-2/neu over expressed.   04/08/2013 - 04/14/2013 Chemotherapy   Paclitaxel/Herceptin weekly x 12. Intolerance to Paclitaxel and only receiving 2 cycles   05/04/2013 - 04/05/2014 Chemotherapy   Herceptin every 21 days    06/28/2013 -  Chemotherapy   Anastrazole started   08/02/2013 Imaging   Bone density- normal   04/06/2014 Imaging   MUGA- Left ventricular ejection fraction equals 53%.   08/06/2015 Imaging   Bone density- osteopenia   03/06/2016 Procedure   Stereotactic-guided biopsy of right breast upper outer quadrant focal asymmetry/calcifications. No apparent complications.   03/06/2016 Procedure   Successful placement of coil shaped marker within the right breast upper outer quadrant biopsy site, post stereotactic core needle biopsy.   Invasive ductal carcinoma of breast, female, right ([place])  02/26/2016 Mammogram   1. Focal asymmetry within the upper-outer quadrant of the right breast, with associated new calcifications. This is a suspicious finding for which stereotactic biopsy with 3D tomosynthesis guidance is recommended. 2. Cluster of cysts at the 10 o'clock axis, 5 cm from the nipple, measuring 1.2 x 0.6 x 1.1 cm, a possible correlate for the mammographic asymmetry.   03/06/2016 Procedure   Right needle core biopsy   03/07/2016 Pathology Results   Breast, right, needle core biopsy, upper outer quadrant - HIGH  GRADE DUCTAL CARCINOMA IN SITU WITH NECROSIS AND ASSOCIATED CALCIFICATIONS.IMMUNOHISTOCHEMICAL AND MORPHOMETRIC ANALYSIS PERFORMED MANUALLY Estrogen Receptor: 0%, NEGATIVE Progesterone Receptor: 0%, NEGATIVE   04/02/2016 Procedure   Breast, lumpectomy, right by Dr. Arnoldo Morale   04/07/2016 Pathology Results   INVASIVE DUCTAL CARCINOMA, GRADE 2, SPANNING 0.6 CM THE CARCINOMA IS FOCALLY PRESENTED AT THE CAUTERIZED MEDIAL MARGIN EXTENSIVE DUCTAL CARCINOMA IN SITU, GRADE 3 ALL OTHER SURGICAL MARGIN ARE NEGATIVE FOR CARCINOMA ER 0% PR 0% Ki-67 15% HER2 NEGATIVE   05/05/2016 Pathology Results   MammaPrint: LOW RISK (97.8% of low risk Mammaprint patients who were treated with anti-hormonal therapy alone are living without distant recurrence of breast cancer at 5-years).   06/02/2016 - 07/04/2016 Radiation Therapy   XRT in [place], [place].  Right breast 42.56 Gy delivered in 16 fractions at 2.65 Gy per fraction and a right tumor bed boost 16 Gy delivered in 8 fractions at 2 Gy per fraction for a total dose of 58.56 Gy completed on 07/04/2016.      CANCER STAGING: Cancer Staging Invasive ductal carcinoma of breast, female, right [facility]) Staging form: Breast, AJCC 8th Edition - Pathologic stage from 05/08/2016: Stage IIA (pT1b, pN0, cM0, G2, ER: Negative, PR: Negative, HER2: Negative) - Signed by Baird Cancer, PA-C on 05/08/2016  Malignant neoplasm of right female breast [facility]) Staging form: Breast, AJCC 7th Edition - Clinical stage from 03/16/2013: Stage IA (T1b, N0, cM0, Free text: stage I) - Unsigned - Pathologic: Stage IA (T1b, N0, cM0) - Signed by Farrel Gobble, MD on 03/17/2013    INTERVAL HISTORY:  [name] 79 y.o. female returns for routine follow-up for breast cancer.  Patient has been  doing well since her last visit.  She denies any new lumps or bumps present.  She denies any new bone pain.  She denies any new fatigue. Denies any nausea, vomiting, or diarrhea. Denies any new pains. Had not  noticed any recent bleeding such as epistaxis, hematuria or hematochezia. Denies recent chest pain on exertion, shortness of breath on minimal exertion, pre-syncopal episodes, or palpitations. Denies any numbness or tingling in hands or feet. Denies any recent fevers, infections, or recent hospitalizations. Patient reports appetite at 100% and energy level at 50%.  She is eating well maintaining her weight at this time.    REVIEW OF SYSTEMS:  Review of Systems  All other systems reviewed and are negative.    PAST MEDICAL/SURGICAL HISTORY:  Past Medical History:  Diagnosis Date  . Anxiety   . Breast cancer ([place])   . Cancer ([place])   . Chronic anticoagulation   . GERD (gastroesophageal reflux disease)   . Hyperlipidemia   . Hypertension   . Invasive ductal carcinoma of breast, female, right ([place]) 05/08/2016  . Osteopenia determined by x-ray 10/03/2015  . Paroxysmal atrial fibrillation [facility])    Past Surgical History:  Procedure Laterality Date  . ABDOMINAL HYSTERECTOMY    . AXILLARY LYMPH NODE DISSECTION Left 02/23/2013   Procedure: LEFT AXILLARY LYMPH NODE DISSECTION;  Surgeon: Scherry Ran, MD;  Location: [facility];  Service: General;  Laterality: Left;  . CHOLECYSTECTOMY    . COLONOSCOPY  03/10/2002   RMR: Incomplete colonoscopy (sigmoidoscopy)/Normal rectum/Normal colon to 40 cm.   . ESOPHAGOGASTRODUODENOSCOPY  03/10/2002   XFG:HWEXHB esophagus/small HH/Tiny AVM in antrum, otherwise normal stomach and D1 and D2/Status post passage of the [facility] dilator  . PARTIAL MASTECTOMY WITH AXILLARY SENTINEL LYMPH NODE BIOPSY Left 02/23/2013   Procedure: LEFT PARTIAL MASTECTOMY WITH AXILLARY SIMPLE NODE BIOPSY, LEFT BREAST WIDE EXCISION;  Surgeon: Scherry Ran, MD;  Location: [facility];  Service: General;  Laterality: Left;  . PARTIAL MASTECTOMY WITH NEEDLE LOCALIZATION Right 04/02/2016   Procedure: RIGHT PARTIAL MASTECTOMY AFTER NEEDLE LOCALIZATION;  Surgeon: Aviva Signs, MD;  Location:  [facility];  Service: General;  Laterality: Right;  . PORT-A-CATH REMOVAL Right 07/31/2017   Procedure: MINOR REMOVAL PORT-A-CATH;  Surgeon: Aviva Signs, MD;  Location: [facility];  Service: General;  Laterality: Right;  . PORTACATH PLACEMENT Right 04/07/2013  . PORTACATH PLACEMENT Right 04/07/2013   Procedure: INSERTION PORT-A-CATH;  Surgeon: Scherry Ran, MD;  Location: [facility];  Service: General;  Laterality: Right;     SOCIAL HISTORY:  Social History   Socioeconomic History  . Marital status: Legally Separated    Spouse name: Not on file  . Number of children:  2  . Years of education: Not on file  . Highest education level: Not on file  Occupational History  . 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 Smoking status: Never Smoker  . Smokeless tobacco: Never Used  Substance and Sexual Activity  . Alcohol use: No  . Drug use: No  . Sexual activity: Yes    Birth control/protection: Surgical  Other Topics Concern  . Not on file  Social History Narrative   2 adult children   Social Determinants of Health   Financial Resource Strain:   . Difficulty of Paying Living Expenses: Not on file  Food Insecurity:   . Worried About Charity fundraiser in the Last Year: Not on file  . Ran Out of Food in the Last Year: Not  on file  Transportation Needs:   . Lack of Transportation (Medical): Not on file  . Lack of Transportation (Non-Medical): Not on file  Physical Activity:   . Days of Exercise per Week: Not on file  . Minutes of Exercise per Session: Not on file  Stress:   . Feeling of Stress : Not on file  Social Connections:   . Frequency of Communication with Friends and Family: Not on file  . Frequency of Social Gatherings with Friends and Family: Not on file  . Attends Religious Services: Not on file  . Active Member of Clubs or Organizations: Not on file  . Attends Archivist Meetings: Not on file  . Marital Status: Not on file  Intimate  Partner Violence:   . Fear of Current or Ex-Partner: Not on file  . Emotionally Abused: Not on file  . Physically Abused: Not on file  . Sexually Abused: Not on file    FAMILY HISTORY:  Family History  Problem Relation Age of Onset  . Heart disease Mother        Also 63 of 9 siblings  . Heart attack Father   . Leukemia Other   . Leukemia Other   . Colon cancer Neg Hx     CURRENT MEDICATIONS:  Outpatient Encounter Medications as of 06/01/2019  Medication Sig  . anastrozole (ARIMIDEX) 1 MG tablet Take 1 tablet (1 mg total) by mouth daily.  Marland Kitchen atenolol (TENORMIN) 100 MG tablet Take 100 mg by mouth daily.  . calcium carbonate (OS-CAL) 600 MG TABS tablet Take 600 mg by mouth 2 (two) times daily with a meal.   . cetirizine (ZYRTEC) 10 MG tablet Take 10 mg by mouth every morning.   . Cholecalciferol (VITAMIN D) 400 UNITS capsule Take 400 Units by mouth 2 (two) times daily.   Marland Kitchen denosumab (PROLIA) 60 MG/ML SOLN injection Inject 60 mg into the skin every 6 (six) months. Administer in upper arm, thigh, or abdomen  . diltiazem (CARDIZEM CD) 120 MG 24 hr capsule TAKE 1 CAPSULE BY MOUTH EVERY DAY.  . fluticasone (FLONASE) 50 MCG/ACT nasal spray Place 2 sprays into both nostrils at bedtime.   Marland Kitchen LORazepam (ATIVAN) 1 MG tablet Take 1 mg by mouth at bedtime.   Marland Kitchen losartan (COZAAR) 50 MG tablet Take 50 mg by mouth every morning.   . metoCLOPramide (REGLAN) 10 MG tablet TAKE 1 TABLET BY MOUTH EVERY 8 HOURS AS NEEDED FOR NAUSEA.  . pantoprazole (PROTONIX) 40 MG tablet TAKE (1) TABLET BY MOUTH TWICE DAILY. (Patient taking differently: TAKE 40 MG BY MOUTH TWICE DAILY.)  . psyllium (METAMUCIL) 58.6 % powder Take 1 packet by mouth daily.   . simvastatin (ZOCOR) 10 MG tablet Take 10 mg by mouth at bedtime.    Marland Kitchen warfarin (COUMADIN) 2.5 MG tablet TAKE 1/2 TABLET BY MOUTH DAILY EXCEPT 1 TABLET ON TUESDAYS AND FRIDAYS.  . [DISCONTINUED] anastrozole (ARIMIDEX) 1 MG tablet Take 1 tablet (1 mg total) by mouth daily.    . [DISCONTINUED] atenolol (TENORMIN) 50 MG tablet Take 50 mg by mouth daily before breakfast.    No facility-administered encounter medications on file as of 06/01/2019.    ALLERGIES:  Allergies  Allergen Reactions  . Iohexol Hives  . Sulfa Antibiotics Rash  . Sulfasalazine Rash     PHYSICAL EXAM:  ECOG Performance status: 1  Vitals:   06/01/19 1304  BP: 110/63  Pulse: 78  Resp: 16  Temp: (!) 97.1 F (36.2 C)  SpO2: 98%   Filed Weights   06/01/19 1304  Weight: 146 lb (66.2 kg)    Physical Exam Constitutional:      Appearance: Normal appearance. She is normal weight.  Cardiovascular:     Rate and Rhythm: Rhythm irregular.     Heart sounds: Normal heart sounds.     Comments: A. fib Pulmonary:     Effort: Pulmonary effort is normal.     Breath sounds: Normal breath sounds.  Abdominal:     General: Bowel sounds are normal.     Palpations: Abdomen is soft.  Musculoskeletal:        General: Normal range of motion.  Skin:    General: Skin is warm.  Neurological:     Mental Status: She is alert and oriented to person, place, and time. Mental status is at baseline.  Psychiatric:        Mood and Affect: Mood normal.        Behavior: Behavior normal.        Thought Content: Thought content normal.        Judgment: Judgment normal.      LABORATORY DATA:  I have reviewed the labs as listed.  CBC    Component Value Date/Time   WBC 7.1 11/24/2018 1353   RBC 4.11 11/24/2018 1353   HGB 12.6 11/24/2018 1353   HCT 39.9 11/24/2018 1353   PLT 244 11/24/2018 1353   MCV 97.1 11/24/2018 1353   MCH 30.7 11/24/2018 1353   MCHC 31.6 11/24/2018 1353   RDW 13.7 11/24/2018 1353   LYMPHSABS 2.6 11/24/2018 1353   MONOABS 0.8 11/24/2018 1353   EOSABS 0.1 11/24/2018 1353   BASOSABS 0.1 11/24/2018 1353   CMP Latest Ref Rng & Units 05/30/2019 11/24/2018 05/27/2018  Glucose 70 - 99 mg/dL 95 100(H) 113(H)  BUN 8 - 23 mg/dL '16 16 15  ' Creatinine 0.44 - 1.00 mg/dL 0.99 1.24(H)  1.08(H)  Sodium 135 - 145 mmol/L 140 139 137  Potassium 3.5 - 5.1 mmol/L 4.2 4.3 3.9  Chloride 98 - 111 mmol/L 104 107 104  CO2 22 - 32 mmol/L '27 23 23  ' Calcium 8.9 - 10.3 mg/dL 9.5 9.2 8.8(L)  Total Protein 6.5 - 8.1 g/dL 6.9 6.7 7.1  Total Bilirubin 0.3 - 1.2 mg/dL 0.9 0.6 0.6  Alkaline Phos 38 - 126 U/L 45 39 45  AST 15 - 41 U/L '22 26 25  ' ALT 0 - 44 U/L '21 24 27     ' DIAGNOSTIC IMAGING:  I have independently reviewed the mammogram scan and discussed with the patient.   I personally performed a face-to-face visit.  All questions were answered to patient's stated satisfaction. Encouraged patient to call with any new concerns or questions before his next visit to the cancer center and we can certain see him sooner, if needed.     ASSESSMENT & PLAN:   Malignant neoplasm of right female breast ([place]) 1.  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  ER ER positive/PR positive/HER-2 positive. -She was treated with an definite left lumpectomy upfront, followed by Taxol/Herceptin x12 weekly cycles, Taxol was discontinued after 2 cycles due to toxicities. -Herceptin was continued x52 weeks and she was started on antiendocrine therapy beginning 06/2013. -She did not receive any adjuvant XRT.  She has been on Arimidex since 06/2013. -Bilateral diagnostic mammogram done on 03/08/2019 was BI-RADS Category 2 benign. -Labs done on 05/30/2019 were all WNL -She will  continue on Arimidex daily. She will complete 10 years.  -She will follow up in 8 months with repeat labs and mammogram.  2.  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  ER negative/PR negative/HER-2 negative -She is status post right lumpectomy -MammaPrint was completed and she was identified to have a low risk disease.  Thus no adjuvant chemotherapy was recommended. -She did undergo adjuvant XRT completed on  07/04/2016.  3.  Osteopenia: -This is in the setting of AI therapy. -Prolia started on 10/08/2015 and combination with calcium and vitamin D. -Bone density done on 03/02/2018 was reviewed and showed osteopenia. -Prolia will be continued every 6 months.  She will also continue calcium and vitamin D daily.      Orders placed this encounter:  Orders Placed This Encounter  Procedures  . MM DIAG BREAST TOMO BILATERAL  . Lactate dehydrogenase  . CBC with Differential/Platelet  . Comprehensive metabolic panel  . Vitamin B12  . VITAMIN D 25 Hydroxy (Vit-D Deficiency, Fractures)      Francene Finders, FNP-C [place] [phone]

## 2019-06-01 NOTE — Progress Notes (Signed)
Prolia injection given per orders. Patient tolerated it well without problems. Vitals stable and discharged home from clinic ambulatory. Follow up as scheduled.  

## 2019-06-01 NOTE — Patient Instructions (Addendum)
[place] at [facility]  Discharge Instructions: Follow up in November after mammogram with labs   You saw Francene Finders, NP, today. _______________________________________________________________  Thank you for choosing [place] at [facility] to provide your oncology and hematology care.  To afford each patient quality time with our providers, please arrive at least 15 minutes before your scheduled appointment.  You need to re-schedule your appointment if you arrive 10 or more minutes late.  We strive to give you quality time with our providers, and arriving late affects you and other patients whose appointments are after yours.  Also, if you no show three or more times for appointments you may be dismissed from the clinic.  Again, thank you for choosing [place] at [facility] hope is that these requests will allow you access to exceptional care and in a timely manner. _______________________________________________________________  If you have questions after your visit, please contact our office at [phone] between the hours of 8:30 a.m. and 5:00 p.m. Voicemails left after 4:30 p.m. will not be returned until the following business day. _______________________________________________________________  For prescription refill requests, have your pharmacy contact our office. _______________________________________________________________  Recommendations made by the consultant and any test results will be sent to your referring physician. _______________________________________________________________

## 2019-07-04 ENCOUNTER — Other Ambulatory Visit: Payer: Self-pay

## 2019-07-04 ENCOUNTER — Ambulatory Visit (INDEPENDENT_AMBULATORY_CARE_PROVIDER_SITE_OTHER): Payer: Medicare Other | Admitting: *Deleted

## 2019-07-04 DIAGNOSIS — I4891 Unspecified atrial fibrillation: Secondary | ICD-10-CM

## 2019-07-04 DIAGNOSIS — Z5181 Encounter for therapeutic drug level monitoring: Secondary | ICD-10-CM

## 2019-07-04 LAB — POCT INR: INR: 2.5 (ref 2.0–3.0)

## 2019-07-04 NOTE — Patient Instructions (Signed)
Continue coumadin 1/2 tablet daily except 1 tablet on Tuesdays and Fridays  Recheck in 7 weeks.   

## 2019-07-08 ENCOUNTER — Other Ambulatory Visit (HOSPITAL_COMMUNITY): Payer: Self-pay | Admitting: Hematology

## 2019-07-08 DIAGNOSIS — Z79811 Long term (current) use of aromatase inhibitors: Secondary | ICD-10-CM

## 2019-07-08 DIAGNOSIS — R110 Nausea: Secondary | ICD-10-CM

## 2019-08-22 ENCOUNTER — Ambulatory Visit (INDEPENDENT_AMBULATORY_CARE_PROVIDER_SITE_OTHER): Payer: Medicare Other | Admitting: *Deleted

## 2019-08-22 ENCOUNTER — Other Ambulatory Visit: Payer: Self-pay

## 2019-08-22 DIAGNOSIS — Z5181 Encounter for therapeutic drug level monitoring: Secondary | ICD-10-CM

## 2019-08-22 DIAGNOSIS — I4891 Unspecified atrial fibrillation: Secondary | ICD-10-CM

## 2019-08-22 LAB — POCT INR: INR: 3 (ref 2.0–3.0)

## 2019-08-22 NOTE — Patient Instructions (Signed)
Continue coumadin 1/2 tablet daily except 1 tablet on Tuesdays and Fridays  Recheck in 7 weeks.   

## 2019-08-24 ENCOUNTER — Ambulatory Visit: Payer: Medicare Other | Admitting: Student

## 2019-08-29 ENCOUNTER — Ambulatory Visit: Payer: Medicare Other | Admitting: Physician Assistant

## 2019-08-31 NOTE — Progress Notes (Signed)
Cardiology Office Note    Date:  09/12/2019   ID:  Stacie Huang, DOB 24-Jul-1940, MRN [id number]  PCP:  Lemmie Evens, MD  Cardiologist: Carlyle Dolly, MD EPS: None  Chief Complaint  Patient presents with  . Follow-up    History of Present Illness:  Stacie Huang is a 79 y.o. female with history of atrial fibrillation on Coumadin, hypertension hyperlipidemia, breast CA  Patient last saw Dr. Harl Bowie 07/07/2018 at which time she was doing well.  Patient comes in for f/u. Denies chest pain, dyspnea, palpitations, dizziness or presyncope. She's vaccinated for Covid 19.  No regular exercise. Watches a lot of TV. HR up when she got here but slowed down quickly. She says it hasn't been going fast in a long time but does not feel it today.   Past Medical History:  Diagnosis Date  . Anxiety   . Breast cancer ([place])   . Cancer ([place])   . Chronic anticoagulation   . GERD (gastroesophageal reflux disease)   . Hyperlipidemia   . Hypertension   . 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 Osteopenia determined by x-ray 10/03/2015  . Paroxysmal atrial fibrillation [facility])     Past Surgical History:  Procedure Laterality Date  . ABDOMINAL HYSTERECTOMY    . AXILLARY LYMPH NODE DISSECTION Left 02/23/2013   Procedure: LEFT AXILLARY LYMPH NODE DISSECTION;  Surgeon: Scherry Ran, MD;  Location: [facility];  Service: General;  Laterality: Left;  . CHOLECYSTECTOMY    . COLONOSCOPY  03/10/2002   RMR: Incomplete colonoscopy (sigmoidoscopy)/Normal rectum/Normal colon to 40 cm.   . ESOPHAGOGASTRODUODENOSCOPY  03/10/2002   [id number] esophagus/small HH/Tiny AVM in antrum, otherwise normal stomach and D1 and D2/Status post passage of the [facility] dilator  . PARTIAL MASTECTOMY WITH AXILLARY SENTINEL LYMPH NODE BIOPSY Left 02/23/2013   Procedure: LEFT PARTIAL MASTECTOMY WITH AXILLARY SIMPLE NODE BIOPSY, LEFT BREAST WIDE EXCISION;  Surgeon: Scherry Ran, MD;  Location:  [facility];  Service: General;  Laterality: Left;  . PARTIAL MASTECTOMY WITH NEEDLE LOCALIZATION Right 04/02/2016   Procedure: RIGHT PARTIAL MASTECTOMY AFTER NEEDLE LOCALIZATION;  Surgeon: Aviva Signs, MD;  Location: [facility];  Service: General;  Laterality: Right;  . PORT-A-CATH REMOVAL Right 07/31/2017   Procedure: MINOR REMOVAL PORT-A-CATH;  Surgeon: Aviva Signs, MD;  Location: [facility];  Service: General;  Laterality: Right;  . PORTACATH PLACEMENT Right 04/07/2013  . PORTACATH PLACEMENT Right 04/07/2013   Procedure: INSERTION PORT-A-CATH;  Surgeon: Scherry Ran, MD;  Location: [facility];  Service: General;  Laterality: Right;    Current Medications: Current Meds  Medication Sig  . anastrozole (ARIMIDEX) 1 MG tablet Take 1 tablet (1 mg total) by mouth daily.  Marland Kitchen atenolol (TENORMIN) 100 MG tablet Take 1 tablet (100 mg total) by mouth daily.  . calcium carbonate (OS-CAL) 600 MG TABS tablet Take 600 mg by mouth 2 (two) times daily with a meal.   . cetirizine (ZYRTEC) 10 MG tablet Take 10 mg by mouth every morning.   . Cholecalciferol (VITAMIN D) 400 UNITS capsule Take 400 Units by mouth 2 (two) times daily.   Marland Kitchen denosumab (PROLIA) 60 MG/ML SOLN injection Inject 60 mg into the skin every 6 (six) months. Administer in upper arm, thigh, or abdomen  . diltiazem (CARDIZEM CD) 120 MG 24 hr capsule Take 1 capsule (120 mg total) by mouth daily.  . fluticasone (FLONASE) 50 MCG/ACT nasal spray Place 2 sprays into both nostrils at bedtime.   Marland Kitchen  LORazepam (ATIVAN) 1 MG tablet Take 1 mg by mouth at bedtime.   Marland Kitchen losartan (COZAAR) 50 MG tablet Take 1 tablet (50 mg total) by mouth every morning.  . metoCLOPramide (REGLAN) 10 MG tablet TAKE 1 TABLET BY MOUTH EVERY 8 HOURS AS NEEDED FOR NAUSEA.  . pantoprazole (PROTONIX) 40 MG tablet TAKE (1) TABLET BY MOUTH TWICE DAILY. (Patient taking differently: TAKE 40 MG BY MOUTH TWICE DAILY.)  . psyllium (METAMUCIL) 58.6 % powder Take 1 packet by mouth daily.   . simvastatin  (ZOCOR) 10 MG tablet Take 10 mg by mouth at bedtime.    Marland Kitchen warfarin (COUMADIN) 2.5 MG tablet TAKE 1/2 TABLET BY MOUTH DAILY EXCEPT 1 TABLET ON TUESDAYS AND FRIDAYS.  . [DISCONTINUED] atenolol (TENORMIN) 100 MG tablet Take 100 mg by mouth daily.  . [DISCONTINUED] diltiazem (CARDIZEM CD) 120 MG 24 hr capsule TAKE 1 CAPSULE BY MOUTH EVERY DAY.  . [DISCONTINUED] losartan (COZAAR) 50 MG tablet Take 50 mg by mouth every morning.      Allergies:   Iohexol, Sulfa antibiotics, and Sulfasalazine   Social History   Socioeconomic History  . Marital status: Legally Separated    Spouse name: Not on file  . Number of children:  2  . Years of education: Not on file  . Highest education level: Not on file  Occupational History  . 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 Smoking status: Never Smoker  . Smokeless tobacco: Never Used  Substance and Sexual Activity  . Alcohol use: No  . Drug use: No  . Sexual activity: Yes    Birth control/protection: Surgical  Other Topics Concern  . Not on file  Social History Narrative   2 adult children   Social Determinants of Health   Financial Resource Strain:   . Difficulty of Paying Living Expenses:   Food Insecurity:   . Worried About Charity fundraiser in the Last Year:   . Arboriculturist in the Last Year:   Transportation Needs:   . Film/video editor (Medical):   Marland Kitchen Lack of Transportation (Non-Medical):   Physical Activity:   . Days of Exercise per Week:   . Minutes of Exercise per Session:   Stress:   . Feeling of Stress :   Social Connections:   . Frequency of Communication with Friends and Family:   . Frequency of Social Gatherings with Friends and Family:   . Attends Religious Services:   . Active Member of Clubs or Organizations:   . Attends Archivist Meetings:   Marland Kitchen Marital Status:      Family History:  The patient's family history includes Heart attack in her father; Heart disease in her  mother; Leukemia in some other family members.   ROS:   Please see the history of present illness.    ROS All other systems reviewed and are negative.   PHYSICAL EXAM:   VS:  BP 122/80   Pulse (!) 111   Ht 5\' 1"  (1.549 m)   Wt 146 lb (66.2 kg)   SpO2 97%   BMI 27.59 kg/m   Physical Exam  GEN: Well nourished, well developed, in no acute distress  Neck: no JVD, carotid bruits, or masses Cardiac: Irregular irregular 1/6 systolic murmur at the left sternal border Respiratory:  clear to auscultation bilaterally, normal work of breathing GI: soft, nontender, nondistended, + BS Ext: without cyanosis, clubbing, or edema, Good distal pulses bilaterally Neuro:  Alert  and Oriented x 3,  Psych: euthymic mood, full affect  Wt Readings from Last 3 Encounters:  09/12/19 146 lb (66.2 kg)  06/01/19 146 lb (66.2 kg)  11/24/18 145 lb 14.4 oz (66.2 kg)      Studies/Labs Reviewed:   EKG:  EKG is not ordered today.    Recent Labs: 11/24/2018: Hemoglobin 12.6; Platelets 244 05/30/2019: ALT 21; BUN 16; Creatinine, Ser 0.99; Potassium 4.2; Sodium 140   Lipid Panel    Component Value Date/Time   CHOL 140 05/24/2016 1013   TRIG 187 (H) 05/24/2016 1013   HDL 49 (L) 05/24/2016 1013   CHOLHDL 2.9 05/24/2016 1013   VLDL 37 (H) 05/24/2016 1013   LDLCALC 54 05/24/2016 1013    Additional studies/ records that were reviewed today include:  2D echo 4/29/2016Study Conclusions   - Left ventricle: The cavity size was normal. Wall thickness was    normal. Systolic function was normal. The estimated ejection    fraction was in the range of 55% to 60%. Wall motion was normal;    there were no regional wall motion abnormalities.  - Aortic valve: Mildly calcified annulus. Trileaflet; normal    thickness leaflets. There was trivial regurgitation. Valve area    (VTI): 2.62 cm^2. Valve area (Vmax): 2.62 cm^2.  - Mitral valve: There was mild regurgitation.  - Left atrium: The atrium was moderately dilated.   - Right atrium: The atrium was mildly dilated.  - Technically adequate study.   Transthoracic echocardiography.  M-mode, complete 2D, spectral  Doppler, and color Doppler.  Birthdate:  Patient birthdate:  1940-07-05.  Age:  Patient is 79 yr old.  Sex:  Gender: female.  BMI: 26.3 kg/m^2.  Blood pressure:     123/72  Patient status:  Inpatient.  Study date:  Study date: 08/25/2014. Study time: 01:33  PM.  Location:  Echo laboratory.   -------------------------------------------------------------------      ASSESSMENT:    1. Paroxysmal atrial fibrillation (HCC)   2. Essential hypertension   3. Hyperlipidemia, unspecified hyperlipidemia type      PLAN:  In order of problems listed above:  PAF on Coumadin not interested in NOAC-heart rate initially 111 when she first came in but down to 80/m when I checked her.  Asymptomatic.  On low-dose diltiazem and atenolol 100 mg daily.  She is taken her meds today.  We will not make any changes today.  She will call if she has palpitations.  PCP to draw lab work next month.  Hypertension blood pressure well controlled  Hyperlipidemia on simvastatin managed by PCP    Medication Adjustments/Labs and Tests Ordered: Current medicines are reviewed at length with the patient today.  Concerns regarding medicines are outlined above.  Medication changes, Labs and Tests ordered today are listed in the Patient Instructions below. Patient Instructions  Medication Instructions:  Your physician recommends that you continue on your current medications as directed. Please refer to the Current Medication list given to you today.  *If you need a refill on your cardiac medications before your next appointment, please call your pharmacy*   Lab Work: NONE   If you have labs (blood work) drawn today and your tests are completely normal, you will receive your results only by: Marland Kitchen MyChart Message (if you have MyChart) OR . A paper copy in the mail If you  have any lab test that is abnormal or we need to change your treatment, we will call you to review the results.   Testing/Procedures:  NONE   Follow-Up: At [facility], you and your health needs are our priority.  As part of our continuing mission to provide you with exceptional heart care, we have created designated Provider Care Teams.  These Care Teams include your primary Cardiologist (physician) and Advanced Practice Providers (APPs -  Physician Assistants and Nurse Practitioners) who all work together to provide you with the care you need, when you need it.  We recommend signing up for the patient portal called "MyChart".  Sign up information is provided on this After Visit Summary.  MyChart is used to connect with patients for Virtual Visits (Telemedicine).  Patients are able to view lab/test results, encounter notes, upcoming appointments, etc.  Non-urgent messages can be sent to your provider as well.   To learn more about what you can do with MyChart, go to [email or web address].    Your next appointment:   1 year(s)  The format for your next appointment:   In Person  Provider:   You may see Carlyle Dolly, MD or one of the following Advanced Practice Providers on your designated Care Team:    Bernerd Pho, PA-C   Ermalinda Barrios, [place]     Other Instructions Thank you for choosing [facility]!       Sumner Boast, PA-C  09/12/2019 2:09 PM    [facility] [place], [place], [place]  [address] Phone: [phone]; Fax: [phone]

## 2019-09-05 ENCOUNTER — Other Ambulatory Visit (HOSPITAL_COMMUNITY): Payer: Self-pay | Admitting: Nurse Practitioner

## 2019-09-05 DIAGNOSIS — R110 Nausea: Secondary | ICD-10-CM

## 2019-09-05 DIAGNOSIS — Z79811 Long term (current) use of aromatase inhibitors: Secondary | ICD-10-CM

## 2019-09-12 ENCOUNTER — Other Ambulatory Visit: Payer: Self-pay

## 2019-09-12 ENCOUNTER — Encounter: Payer: Self-pay | Admitting: Physician Assistant

## 2019-09-12 ENCOUNTER — Ambulatory Visit (INDEPENDENT_AMBULATORY_CARE_PROVIDER_SITE_OTHER): Payer: Medicare Other | Admitting: Physician Assistant

## 2019-09-12 VITALS — BP 122/80 | HR 111 | Ht 61.0 in | Wt 146.0 lb

## 2019-09-12 DIAGNOSIS — I480 Paroxysmal atrial fibrillation: Secondary | ICD-10-CM | POA: Diagnosis not present

## 2019-09-12 DIAGNOSIS — E785 Hyperlipidemia, unspecified: Secondary | ICD-10-CM

## 2019-09-12 DIAGNOSIS — I10 Essential (primary) hypertension: Secondary | ICD-10-CM

## 2019-09-12 MED ORDER — DILTIAZEM HCL ER COATED BEADS 120 MG PO CP24: 120.0000 mg | ORAL_CAPSULE | Freq: Every day | ORAL | 3 refills | Status: DC

## 2019-09-12 MED ORDER — ATENOLOL 100 MG PO TABS: 100.0000 mg | ORAL_TABLET | Freq: Every day | ORAL | 3 refills | Status: DC

## 2019-09-12 MED ORDER — LOSARTAN POTASSIUM 50 MG PO TABS: 50.0000 mg | ORAL_TABLET | Freq: Every morning | ORAL | 3 refills | Status: DC

## 2019-09-12 NOTE — Patient Instructions (Signed)
Medication Instructions:  Your physician recommends that you continue on your current medications as directed. Please refer to the Current Medication list given to you today.  *If you need a refill on your cardiac medications before your next appointment, please call your pharmacy*   Lab Work: NONE   If you have labs (blood work) drawn today and your tests are completely normal, you will receive your results only by: Marland Kitchen MyChart Message (if you have MyChart) OR . A paper copy in the mail If you have any lab test that is abnormal or we need to change your treatment, we will call you to review the results.   Testing/Procedures: NONE   Follow-Up: At [facility], you and your health needs are our priority.  As part of our continuing mission to provide you with exceptional heart care, we have created designated Provider Care Teams.  These Care Teams include your primary Cardiologist (physician) and Advanced Practice Providers (APPs -  Physician Assistants and Nurse Practitioners) who all work together to provide you with the care you need, when you need it.  We recommend signing up for the patient portal called "MyChart".  Sign up information is provided on this After Visit Summary.  MyChart is used to connect with patients for Virtual Visits (Telemedicine).  Patients are able to view lab/test results, encounter notes, upcoming appointments, etc.  Non-urgent messages can be sent to your provider as well.   To learn more about what you can do with MyChart, go to [email or web address].    Your next appointment:   1 year(s)  The format for your next appointment:   In Person  Provider:   You may see Carlyle Dolly, MD or one of the following Advanced Practice Providers on your designated Care Team:    Bernerd Pho, PA-C   Ermalinda Barrios, [place]     Other Instructions Thank you for choosing [facility]!

## 2019-10-11 ENCOUNTER — Other Ambulatory Visit: Payer: Self-pay

## 2019-10-11 ENCOUNTER — Ambulatory Visit (INDEPENDENT_AMBULATORY_CARE_PROVIDER_SITE_OTHER): Payer: Medicare Other | Admitting: *Deleted

## 2019-10-11 DIAGNOSIS — I4891 Unspecified atrial fibrillation: Secondary | ICD-10-CM

## 2019-10-11 DIAGNOSIS — Z5181 Encounter for therapeutic drug level monitoring: Secondary | ICD-10-CM

## 2019-10-11 LAB — POCT INR: INR: 2.8 (ref 2.0–3.0)

## 2019-10-11 NOTE — Patient Instructions (Signed)
Continue coumadin 1/2 tablet daily except 1 tablet on Tuesdays and Fridays  Recheck in 7 weeks.   

## 2019-11-04 ENCOUNTER — Other Ambulatory Visit (HOSPITAL_COMMUNITY): Payer: Self-pay | Admitting: Nurse Practitioner

## 2019-11-04 DIAGNOSIS — R110 Nausea: Secondary | ICD-10-CM

## 2019-11-04 DIAGNOSIS — Z79811 Long term (current) use of aromatase inhibitors: Secondary | ICD-10-CM

## 2019-11-28 ENCOUNTER — Other Ambulatory Visit (HOSPITAL_COMMUNITY): Payer: Self-pay | Admitting: *Deleted

## 2019-11-28 DIAGNOSIS — C50911 Malignant neoplasm of unspecified site of right female breast: Secondary | ICD-10-CM

## 2019-11-29 ENCOUNTER — Inpatient Hospital Stay (HOSPITAL_COMMUNITY): Payer: Medicare Other | Attending: Hematology

## 2019-11-29 ENCOUNTER — Inpatient Hospital Stay (HOSPITAL_COMMUNITY): Payer: Medicare Other

## 2019-11-29 ENCOUNTER — Other Ambulatory Visit: Payer: Self-pay

## 2019-11-29 ENCOUNTER — Encounter (HOSPITAL_COMMUNITY): Payer: Self-pay

## 2019-11-29 VITALS — BP 124/77 | HR 80 | Temp 99.0°F | Resp 18

## 2019-11-29 DIAGNOSIS — C50911 Malignant neoplasm of unspecified site of right female breast: Secondary | ICD-10-CM

## 2019-11-29 DIAGNOSIS — Z79899 Other long term (current) drug therapy: Secondary | ICD-10-CM

## 2019-11-29 DIAGNOSIS — M8580 Other specified disorders of bone density and structure, unspecified site: Secondary | ICD-10-CM | POA: Diagnosis not present

## 2019-11-29 LAB — COMPREHENSIVE METABOLIC PANEL
ALT: 22 U/L (ref 0–44)
AST: 22 U/L (ref 15–41)
Albumin: 4.1 g/dL (ref 3.5–5.0)
Alkaline Phosphatase: 41 U/L (ref 38–126)
Anion gap: 9 (ref 5–15)
BUN: 17 mg/dL (ref 8–23)
CO2: 23 mmol/L (ref 22–32)
Calcium: 9.3 mg/dL (ref 8.9–10.3)
Chloride: 106 mmol/L (ref 98–111)
Creatinine, Ser: 1.13 mg/dL — ABNORMAL HIGH (ref 0.44–1.00)
GFR calc Af Amer: 53 mL/min — ABNORMAL LOW (ref >60)
GFR calc non Af Amer: 46 mL/min — ABNORMAL LOW (ref >60)
Glucose, Bld: 99 mg/dL (ref 70–99)
Potassium: 4.2 mmol/L (ref 3.5–5.1)
Sodium: 138 mmol/L (ref 135–145)
Total Bilirubin: 0.8 mg/dL (ref 0.3–1.2)
Total Protein: 7 g/dL (ref 6.5–8.1)

## 2019-11-29 MED ORDER — DENOSUMAB 60 MG/ML ~~LOC~~ SOSY
60.0000 mg | PREFILLED_SYRINGE | Freq: Once | SUBCUTANEOUS | Status: AC
  Administered 2019-11-29: 60 mg via SUBCUTANEOUS
  Filled 2019-11-29: qty 1

## 2019-11-29 NOTE — Progress Notes (Signed)
Patient taking calcium as directed.  Denied tooth, jaw, and leg pain.  No recent or upcoming dental visits.  Patient tolerated Prolia injection with no complaints.  Site clean and dry with no bruising or swelling noted at site.  Band aid applied.  VSS with discharge and left ambulatory with no s/s of distress noted.  

## 2019-12-01 ENCOUNTER — Ambulatory Visit (INDEPENDENT_AMBULATORY_CARE_PROVIDER_SITE_OTHER): Payer: Medicare Other | Admitting: *Deleted

## 2019-12-01 DIAGNOSIS — I4891 Unspecified atrial fibrillation: Secondary | ICD-10-CM | POA: Diagnosis not present

## 2019-12-01 DIAGNOSIS — Z5181 Encounter for therapeutic drug level monitoring: Secondary | ICD-10-CM

## 2019-12-01 LAB — POCT INR: INR: 2.6 (ref 2.0–3.0)

## 2019-12-01 NOTE — Patient Instructions (Signed)
Continue coumadin 1/2 tablet daily except 1 tablet on Tuesdays and Fridays  Recheck in 7 weeks.   

## 2020-01-03 ENCOUNTER — Other Ambulatory Visit (HOSPITAL_COMMUNITY): Payer: Self-pay | Admitting: Nurse Practitioner

## 2020-01-03 DIAGNOSIS — R110 Nausea: Secondary | ICD-10-CM

## 2020-01-03 DIAGNOSIS — Z79811 Long term (current) use of aromatase inhibitors: Secondary | ICD-10-CM

## 2020-01-19 ENCOUNTER — Ambulatory Visit (INDEPENDENT_AMBULATORY_CARE_PROVIDER_SITE_OTHER): Payer: Medicare Other | Admitting: *Deleted

## 2020-01-19 DIAGNOSIS — Z5181 Encounter for therapeutic drug level monitoring: Secondary | ICD-10-CM | POA: Diagnosis not present

## 2020-01-19 DIAGNOSIS — I4891 Unspecified atrial fibrillation: Secondary | ICD-10-CM | POA: Diagnosis not present

## 2020-01-19 LAB — POCT INR: INR: 2.3 (ref 2.0–3.0)

## 2020-01-19 NOTE — Patient Instructions (Signed)
Continue coumadin 1/2 tablet daily except 1 tablet on Tuesdays and Fridays  Recheck in 7 weeks.   

## 2020-02-15 ENCOUNTER — Other Ambulatory Visit (HOSPITAL_COMMUNITY): Payer: Self-pay | Admitting: Hematology

## 2020-02-15 DIAGNOSIS — C50911 Malignant neoplasm of unspecified site of right female breast: Secondary | ICD-10-CM

## 2020-02-15 DIAGNOSIS — Z9889 Other specified postprocedural states: Secondary | ICD-10-CM

## 2020-02-17 ENCOUNTER — Other Ambulatory Visit: Payer: Self-pay | Admitting: Cardiology

## 2020-02-17 DIAGNOSIS — I480 Paroxysmal atrial fibrillation: Secondary | ICD-10-CM

## 2020-03-05 ENCOUNTER — Other Ambulatory Visit (HOSPITAL_COMMUNITY): Payer: Self-pay

## 2020-03-05 DIAGNOSIS — Z79811 Long term (current) use of aromatase inhibitors: Secondary | ICD-10-CM

## 2020-03-05 DIAGNOSIS — R110 Nausea: Secondary | ICD-10-CM

## 2020-03-05 MED ORDER — METOCLOPRAMIDE HCL 10 MG PO TABS: 10.0000 mg | ORAL_TABLET | Freq: Three times a day (TID) | ORAL | 3 refills | Status: DC | PRN

## 2020-03-08 ENCOUNTER — Ambulatory Visit (INDEPENDENT_AMBULATORY_CARE_PROVIDER_SITE_OTHER): Payer: Medicare Other | Admitting: *Deleted

## 2020-03-08 DIAGNOSIS — Z5181 Encounter for therapeutic drug level monitoring: Secondary | ICD-10-CM | POA: Diagnosis not present

## 2020-03-08 DIAGNOSIS — I4891 Unspecified atrial fibrillation: Secondary | ICD-10-CM

## 2020-03-08 LAB — POCT INR: INR: 2 (ref 2.0–3.0)

## 2020-03-08 NOTE — Patient Instructions (Signed)
Continue coumadin 1/2 tablet daily except 1 tablet on Tuesdays and Fridays  Recheck in 7 weeks.   

## 2020-03-12 ENCOUNTER — Other Ambulatory Visit (HOSPITAL_COMMUNITY): Payer: Self-pay

## 2020-03-12 DIAGNOSIS — C50911 Malignant neoplasm of unspecified site of right female breast: Secondary | ICD-10-CM

## 2020-03-12 DIAGNOSIS — Z79899 Other long term (current) drug therapy: Secondary | ICD-10-CM

## 2020-03-13 ENCOUNTER — Other Ambulatory Visit: Payer: Self-pay

## 2020-03-13 ENCOUNTER — Inpatient Hospital Stay (HOSPITAL_COMMUNITY): Payer: Medicare Other | Attending: Hematology

## 2020-03-13 ENCOUNTER — Ambulatory Visit (HOSPITAL_COMMUNITY)
Admission: RE | Admit: 2020-03-13 | Discharge: 2020-03-13 | Disposition: A | Discharge status: Home / Self Care | Payer: Medicare Other | Source: Ambulatory Visit | Attending: Nurse Practitioner | Admitting: Nurse Practitioner

## 2020-03-13 ENCOUNTER — Ambulatory Visit (HOSPITAL_COMMUNITY)
Admission: RE | Admit: 2020-03-13 | Discharge: 2020-03-13 | Disposition: A | Discharge status: Home / Self Care | Payer: Medicare Other | Source: Ambulatory Visit | Attending: Hematology | Admitting: Hematology

## 2020-03-13 DIAGNOSIS — C50212 Malignant neoplasm of upper-inner quadrant of left female breast: Secondary | ICD-10-CM | POA: Diagnosis present

## 2020-03-13 DIAGNOSIS — C50911 Malignant neoplasm of unspecified site of right female breast: Secondary | ICD-10-CM | POA: Insufficient documentation

## 2020-03-13 DIAGNOSIS — Z170 Estrogen receptor positive status [ER+]: Secondary | ICD-10-CM

## 2020-03-13 DIAGNOSIS — Z9889 Other specified postprocedural states: Secondary | ICD-10-CM | POA: Insufficient documentation

## 2020-03-13 DIAGNOSIS — Z79899 Other long term (current) drug therapy: Secondary | ICD-10-CM

## 2020-03-13 DIAGNOSIS — C50411 Malignant neoplasm of upper-outer quadrant of right female breast: Secondary | ICD-10-CM | POA: Insufficient documentation

## 2020-03-13 DIAGNOSIS — Z923 Personal history of irradiation: Secondary | ICD-10-CM | POA: Diagnosis not present

## 2020-03-13 DIAGNOSIS — Z9013 Acquired absence of bilateral breasts and nipples: Secondary | ICD-10-CM | POA: Insufficient documentation

## 2020-03-13 DIAGNOSIS — Z853 Personal history of malignant neoplasm of breast: Secondary | ICD-10-CM | POA: Diagnosis present

## 2020-03-13 DIAGNOSIS — Z79811 Long term (current) use of aromatase inhibitors: Secondary | ICD-10-CM | POA: Insufficient documentation

## 2020-03-13 DIAGNOSIS — Z171 Estrogen receptor negative status [ER-]: Secondary | ICD-10-CM | POA: Insufficient documentation

## 2020-03-13 DIAGNOSIS — M8580 Other specified disorders of bone density and structure, unspecified site: Secondary | ICD-10-CM | POA: Diagnosis not present

## 2020-03-13 DIAGNOSIS — E538 Deficiency of other specified B group vitamins: Secondary | ICD-10-CM | POA: Diagnosis not present

## 2020-03-13 LAB — CBC WITH DIFFERENTIAL/PLATELET
Abs Immature Granulocytes: 0.07 10*3/uL (ref 0.00–0.07)
Basophils Absolute: 0.1 10*3/uL (ref 0.0–0.1)
Basophils Relative: 1 %
Eosinophils Absolute: 0.1 10*3/uL (ref 0.0–0.5)
Eosinophils Relative: 2 %
HCT: 39.7 % (ref 36.0–46.0)
Hemoglobin: 12.8 g/dL (ref 12.0–15.0)
Immature Granulocytes: 1 %
Lymphocytes Relative: 34 %
Lymphs Abs: 2.2 10*3/uL (ref 0.7–4.0)
MCH: 32.4 pg (ref 26.0–34.0)
MCHC: 32.2 g/dL (ref 30.0–36.0)
MCV: 100.5 fL — ABNORMAL HIGH (ref 80.0–100.0)
Monocytes Absolute: 0.7 10*3/uL (ref 0.1–1.0)
Monocytes Relative: 11 %
Neutro Abs: 3.4 10*3/uL (ref 1.7–7.7)
Neutrophils Relative %: 51 %
Platelets: 262 10*3/uL (ref 150–400)
RBC: 3.95 MIL/uL (ref 3.87–5.11)
RDW: 14.1 % (ref 11.5–15.5)
WBC: 6.5 10*3/uL (ref 4.0–10.5)
nRBC: 0 % (ref 0.0–0.2)

## 2020-03-13 LAB — COMPREHENSIVE METABOLIC PANEL
ALT: 21 U/L (ref 0–44)
AST: 23 U/L (ref 15–41)
Albumin: 3.9 g/dL (ref 3.5–5.0)
Alkaline Phosphatase: 38 U/L (ref 38–126)
Anion gap: 9 (ref 5–15)
BUN: 14 mg/dL (ref 8–23)
CO2: 22 mmol/L (ref 22–32)
Calcium: 9.1 mg/dL (ref 8.9–10.3)
Chloride: 104 mmol/L (ref 98–111)
Creatinine, Ser: 1.21 mg/dL — ABNORMAL HIGH (ref 0.44–1.00)
GFR, Estimated: 45 mL/min — ABNORMAL LOW (ref >60)
Glucose, Bld: 113 mg/dL — ABNORMAL HIGH (ref 70–99)
Potassium: 4.1 mmol/L (ref 3.5–5.1)
Sodium: 135 mmol/L (ref 135–145)
Total Bilirubin: 0.7 mg/dL (ref 0.3–1.2)
Total Protein: 6.7 g/dL (ref 6.5–8.1)

## 2020-03-13 LAB — VITAMIN B12: Vitamin B-12: <50 pg/mL — ABNORMAL LOW (ref 180–914)

## 2020-03-13 LAB — LACTATE DEHYDROGENASE: LDH: 134 U/L (ref 98–192)

## 2020-03-14 LAB — VITAMIN D 25 HYDROXY (VIT D DEFICIENCY, FRACTURES): Vit D, 25-Hydroxy: 51.5 ng/mL (ref 30–100)

## 2020-03-15 ENCOUNTER — Inpatient Hospital Stay (HOSPITAL_BASED_OUTPATIENT_CLINIC_OR_DEPARTMENT_OTHER): Payer: Medicare Other | Admitting: Oncology

## 2020-03-15 ENCOUNTER — Other Ambulatory Visit: Payer: Self-pay

## 2020-03-15 VITALS — BP 108/59 | HR 86 | Temp 97.3°F | Resp 17 | Wt 143.7 lb

## 2020-03-15 DIAGNOSIS — Z170 Estrogen receptor positive status [ER+]: Secondary | ICD-10-CM | POA: Diagnosis not present

## 2020-03-15 DIAGNOSIS — C50212 Malignant neoplasm of upper-inner quadrant of left female breast: Secondary | ICD-10-CM | POA: Diagnosis not present

## 2020-03-15 DIAGNOSIS — C50911 Malignant neoplasm of unspecified site of right female breast: Secondary | ICD-10-CM

## 2020-03-15 MED ORDER — CYANOCOBALAMIN 1000 MCG/ML IJ SOLN
INTRAMUSCULAR | Status: AC
  Filled 2020-03-15: qty 1

## 2020-03-15 MED ORDER — CYANOCOBALAMIN 1000 MCG/ML IJ SOLN
1000.0000 ug | Freq: Once | INTRAMUSCULAR | Status: AC
  Administered 2020-03-15: 1000 ug via INTRAMUSCULAR

## 2020-03-15 NOTE — Progress Notes (Signed)
Received orders from Faythe Casa, NP to administer Vitamin B-12 injection.  Given to right deltoid.  Patient tolerated well.  Discharged ambulatory and in stable condition.

## 2020-03-15 NOTE — Progress Notes (Signed)
[facility] [place], [place] [address]   CLINIC:  Medical Oncology/Hematology  PCP:  Lemmie Evens, MD [place] [address] [phone]   REASON FOR VISIT: Follow-up for breast cancer  CURRENT THERAPY: Arimidex  BRIEF ONCOLOGIC HISTORY:  Oncology History  Malignant neoplasm of right female breast ([place])  02/04/2013 Initial Diagnosis   Breast cancer of upper-inner quadrant of left female breast   02/23/2013 Surgery   left lumpectomy (UIQ), sentinel node biopsy--0.7cm, Node negative, ER+, PR+, HER-2/neu over expressed.   04/08/2013 - 04/14/2013 Chemotherapy   Paclitaxel/Herceptin weekly x 12. Intolerance to Paclitaxel and only receiving 2 cycles   05/04/2013 - 04/05/2014 Chemotherapy   Herceptin every 21 days    06/28/2013 -  Chemotherapy   Anastrazole started   08/02/2013 Imaging   Bone density- normal   04/06/2014 Imaging   MUGA- Left ventricular ejection fraction equals 53%.   08/06/2015 Imaging   Bone density- osteopenia   03/06/2016 Procedure   Stereotactic-guided biopsy of right breast upper outer quadrant focal asymmetry/calcifications. No apparent complications.   03/06/2016 Procedure   Successful placement of coil shaped marker within the right breast upper outer quadrant biopsy site, post stereotactic core needle biopsy.   Invasive ductal carcinoma of breast, female, right ([place])  02/26/2016 Mammogram   1. Focal asymmetry within the upper-outer quadrant of the right breast, with associated new calcifications. This is a suspicious finding for which stereotactic biopsy with 3D tomosynthesis guidance is recommended. 2. Cluster of cysts at the 10 o'clock axis, 5 cm from the nipple, measuring 1.2 x 0.6 x 1.1 cm, a possible correlate for the mammographic asymmetry.   03/06/2016 Procedure   Right needle core biopsy   03/07/2016 Pathology Results   Breast, right, needle core biopsy, upper outer quadrant - HIGH  GRADE DUCTAL CARCINOMA IN SITU WITH NECROSIS AND ASSOCIATED CALCIFICATIONS.IMMUNOHISTOCHEMICAL AND MORPHOMETRIC ANALYSIS PERFORMED MANUALLY Estrogen Receptor: 0%, NEGATIVE Progesterone Receptor: 0%, NEGATIVE   04/02/2016 Procedure   Breast, lumpectomy, right by Dr. Arnoldo Morale   04/07/2016 Pathology Results   INVASIVE DUCTAL CARCINOMA, GRADE 2, SPANNING 0.6 CM THE CARCINOMA IS FOCALLY PRESENTED AT THE CAUTERIZED MEDIAL MARGIN EXTENSIVE DUCTAL CARCINOMA IN SITU, GRADE 3 ALL OTHER SURGICAL MARGIN ARE NEGATIVE FOR CARCINOMA ER 0% PR 0% Ki-67 15% HER2 NEGATIVE   05/05/2016 Pathology Results   MammaPrint: LOW RISK (97.8% of low risk Mammaprint patients who were treated with anti-hormonal therapy alone are living without distant recurrence of breast cancer at 5-years).   06/02/2016 - 07/04/2016 Radiation Therapy   XRT in [place], [place].  Right breast 42.56 Gy delivered in 16 fractions at 2.65 Gy per fraction and a right tumor bed boost 16 Gy delivered in 8 fractions at 2 Gy per fraction for a total dose of 58.56 Gy completed on 07/04/2016.      CANCER STAGING: Cancer Staging Invasive ductal carcinoma of breast, female, right [facility]) Staging form: Breast, AJCC 8th Edition - Pathologic stage from 05/08/2016: Stage IIA (pT1b, pN0, cM0, G2, ER: Negative, PR: Negative, HER2: Negative) - Signed by Baird Cancer, PA-C on 05/08/2016  Malignant neoplasm of right female breast [facility]) Staging form: Breast, AJCC 7th Edition - Clinical stage from 03/16/2013: Stage IA (T1b, N0, cM0, Free text: stage I) - Unsigned - Pathologic: Stage IA (T1b, N0, cM0) - Signed by Farrel Gobble, MD on 03/17/2013    INTERVAL HISTORY:  Ms. Campise 79 y.o. female returns for routine follow-up for breast cancer.  Patient has been  doing well since her last visit.  Endorses a chronic morning cough that improves throughout the day.  She denies any new lumps or bumps present.  She denies any new bone pain.  She denies any new fatigue.  Denies any nausea, vomiting, or diarrhea. Denies any new pains. Had not noticed any recent bleeding such as epistaxis, hematuria or hematochezia. Denies recent chest pain on exertion, shortness of breath on minimal exertion, pre-syncopal episodes, or palpitations. Denies any numbness or tingling in hands or feet. Denies any recent fevers, infections, or recent hospitalizations. Patient reports appetite at 100% and energy level at 50%.  She is eating well maintaining her weight at this time.    REVIEW OF SYSTEMS:  Review of Systems  All other systems reviewed and are negative.    PAST MEDICAL/SURGICAL HISTORY:  Past Medical History:  Diagnosis Date  . Anxiety   . Breast cancer ([place])   . Cancer ([place])   . Chronic anticoagulation   . GERD (gastroesophageal reflux disease)   . Hyperlipidemia   . Hypertension   . Invasive ductal carcinoma of breast, female, right ([place]) 05/08/2016  . Osteopenia determined by x-ray 10/03/2015  . Paroxysmal atrial fibrillation [facility])    Past Surgical History:  Procedure Laterality Date  . ABDOMINAL HYSTERECTOMY    . AXILLARY LYMPH NODE DISSECTION Left 02/23/2013   Procedure: LEFT AXILLARY LYMPH NODE DISSECTION;  Surgeon: Scherry Ran, MD;  Location: [facility];  Service: General;  Laterality: Left;  . CHOLECYSTECTOMY    . COLONOSCOPY  03/10/2002   RMR: Incomplete colonoscopy (sigmoidoscopy)/Normal rectum/Normal colon to 40 cm.   . ESOPHAGOGASTRODUODENOSCOPY  03/10/2002   YYQ:MGNOIB esophagus/small HH/Tiny AVM in antrum, otherwise normal stomach and D1 and D2/Status post passage of the [facility] dilator  . PARTIAL MASTECTOMY WITH AXILLARY SENTINEL LYMPH NODE BIOPSY Left 02/23/2013   Procedure: LEFT PARTIAL MASTECTOMY WITH AXILLARY SIMPLE NODE BIOPSY, LEFT BREAST WIDE EXCISION;  Surgeon: Scherry Ran, MD;  Location: [facility];  Service: General;  Laterality: Left;  . PARTIAL MASTECTOMY WITH NEEDLE LOCALIZATION Right 04/02/2016   Procedure: RIGHT PARTIAL  MASTECTOMY AFTER NEEDLE LOCALIZATION;  Surgeon: Aviva Signs, MD;  Location: [facility];  Service: General;  Laterality: Right;  . PORT-A-CATH REMOVAL Right 07/31/2017   Procedure: MINOR REMOVAL PORT-A-CATH;  Surgeon: Aviva Signs, MD;  Location: [facility];  Service: General;  Laterality: Right;  . PORTACATH PLACEMENT Right 04/07/2013  . PORTACATH PLACEMENT Right 04/07/2013   Procedure: INSERTION PORT-A-CATH;  Surgeon: Scherry Ran, MD;  Location: [facility];  Service: General;  Laterality: Right;     SOCIAL HISTORY:  Social History   Socioeconomic History  . Marital status: Legally Separated    Spouse name: Not on file  . Number of children:  2  . Years of education: Not on file  . Highest education level: Not on file  Occupational History  . 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 Smoking status: Never Smoker  . Smokeless tobacco: Never Used  Vaping Use  . Vaping Use: Never used  Substance and Sexual Activity  . Alcohol use: No  . Drug use: No  . Sexual activity: Yes    Birth control/protection: Surgical  Other Topics Concern  . Not on file  Social History Narrative   2 adult children   Social Determinants of Health   Financial Resource Strain:   . Difficulty of Paying Living Expenses: Not on file  Food Insecurity:   . Worried About Estate manager/land agent  of Food in the Last Year: Not on file  . Ran Out of Food in the Last Year: Not on file  Transportation Needs:   . Lack of Transportation (Medical): Not on file  . Lack of Transportation (Non-Medical): Not on file  Physical Activity:   . Days of Exercise per Week: Not on file  . Minutes of Exercise per Session: Not on file  Stress:   . Feeling of Stress : Not on file  Social Connections:   . Frequency of Communication with Friends and Family: Not on file  . Frequency of Social Gatherings with Friends and Family: Not on file  . Attends Religious Services: Not on file  . Active Member of Clubs or  Organizations: Not on file  . Attends Archivist Meetings: Not on file  . Marital Status: Not on file  Intimate Partner Violence:   . Fear of Current or Ex-Partner: Not on file  . Emotionally Abused: Not on file  . Physically Abused: Not on file  . Sexually Abused: Not on file    FAMILY HISTORY:  Family History  Problem Relation Age of Onset  . Heart disease Mother        Also 78 of 9 siblings  . Heart attack Father   . Leukemia Other   . Leukemia Other   . Colon cancer Neg Hx     CURRENT MEDICATIONS:  Outpatient Encounter Medications as of 03/15/2020  Medication Sig  . anastrozole (ARIMIDEX) 1 MG tablet Take 1 tablet (1 mg total) by mouth daily.  Marland Kitchen atenolol (TENORMIN) 100 MG tablet Take 1 tablet (100 mg total) by mouth daily.  . calcium carbonate (OS-CAL) 600 MG TABS tablet Take 600 mg by mouth 2 (two) times daily with a meal.   . cetirizine (ZYRTEC) 10 MG tablet Take 10 mg by mouth every morning.   . Cholecalciferol (VITAMIN D) 400 UNITS capsule Take 400 Units by mouth 2 (two) times daily.   Marland Kitchen denosumab (PROLIA) 60 MG/ML SOLN injection Inject 60 mg into the skin every 6 (six) months. Administer in upper arm, thigh, or abdomen  . diltiazem (CARDIZEM CD) 120 MG 24 hr capsule Take 1 capsule (120 mg total) by mouth daily.  . fluticasone (FLONASE) 50 MCG/ACT nasal spray Place 2 sprays into both nostrils at bedtime.   Marland Kitchen LORazepam (ATIVAN) 1 MG tablet Take 1 mg by mouth at bedtime.   Marland Kitchen losartan (COZAAR) 50 MG tablet Take 1 tablet (50 mg total) by mouth every morning.  . metoCLOPramide (REGLAN) 10 MG tablet Take 1 tablet (10 mg total) by mouth every 8 (eight) hours as needed. for nausea  . pantoprazole (PROTONIX) 40 MG tablet TAKE (1) TABLET BY MOUTH TWICE DAILY. (Patient taking differently: TAKE 40 MG BY MOUTH TWICE DAILY.)  . psyllium (METAMUCIL) 58.6 % powder Take 1 packet by mouth daily.   . simvastatin (ZOCOR) 10 MG tablet Take 10 mg by mouth at bedtime.    Marland Kitchen warfarin  (COUMADIN) 2.5 MG tablet TAKE 1/2 TABLET BY MOUTH DAILY EXCEPT 1 TABLET ON TUESDAYS AND FRIDAYS.   Facility-Administered Encounter Medications as of 03/15/2020  Medication  . cyanocobalamin ((VITAMIN B-12)) injection 1,000 mcg    ALLERGIES:  Allergies  Allergen Reactions  . Iohexol Hives  . Sulfa Antibiotics Rash  . Sulfasalazine Rash     PHYSICAL EXAM:  ECOG Performance status: 1  Vitals:   03/15/20 1306  BP: (!) 108/59  Pulse: 86  Resp: 17  Temp: (!) 97.3  F (36.3 C)  SpO2: 97%   Filed Weights   03/15/20 1306  Weight: 143 lb 11.2 oz (65.2 kg)    Physical Exam Constitutional:      Appearance: Normal appearance. She is normal weight.  Cardiovascular:     Rate and Rhythm: Rhythm irregular.     Heart sounds: Normal heart sounds.     Comments: A. fib Pulmonary:     Effort: Pulmonary effort is normal.     Breath sounds: Normal breath sounds.  Abdominal:     General: Bowel sounds are normal.     Palpations: Abdomen is soft.  Musculoskeletal:        General: Normal range of motion.  Skin:    General: Skin is warm.  Neurological:     Mental Status: She is alert and oriented to person, place, and time. Mental status is at baseline.  Psychiatric:        Mood and Affect: Mood normal.        Behavior: Behavior normal.        Thought Content: Thought content normal.        Judgment: Judgment normal.      LABORATORY DATA:  I have reviewed the labs as listed.  CBC    Component Value Date/Time   WBC 6.5 03/13/2020 1302   RBC 3.95 03/13/2020 1302   HGB 12.8 03/13/2020 1302   HCT 39.7 03/13/2020 1302   PLT 262 03/13/2020 1302   MCV 100.5 (H) 03/13/2020 1302   MCH 32.4 03/13/2020 1302   MCHC 32.2 03/13/2020 1302   RDW 14.1 03/13/2020 1302   LYMPHSABS 2.2 03/13/2020 1302   MONOABS 0.7 03/13/2020 1302   EOSABS 0.1 03/13/2020 1302   BASOSABS 0.1 03/13/2020 1302   CMP Latest Ref Rng & Units 03/13/2020 11/29/2019 05/30/2019  Glucose 70 - 99 mg/dL 113(H) 99 95    BUN 8 - 23 mg/dL '14 17 16  ' Creatinine 0.44 - 1.00 mg/dL 1.21(H) 1.13(H) 0.99  Sodium 135 - 145 mmol/L 135 138 140  Potassium 3.5 - 5.1 mmol/L 4.1 4.2 4.2  Chloride 98 - 111 mmol/L 104 106 104  CO2 22 - 32 mmol/L '22 23 27  ' Calcium 8.9 - 10.3 mg/dL 9.1 9.3 9.5  Total Protein 6.5 - 8.1 g/dL 6.7 7.0 6.9  Total Bilirubin 0.3 - 1.2 mg/dL 0.7 0.8 0.9  Alkaline Phos 38 - 126 U/L 38 41 45  AST 15 - 41 U/L '23 22 22  ' ALT 0 - 44 U/L '21 22 21     ' DIAGNOSTIC IMAGING:  I have independently reviewed the mammogram scan and discussed with the patient.   I personally performed a face-to-face visit.  All questions were answered to patient's stated satisfaction. Encouraged patient to call with any new concerns or questions before his next visit to the cancer center and we can certain see him sooner, if needed.     ASSESSMENT & PLAN:  1.  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  ER ER positive/PR positive/HER-2 positive. -She was treated with an definite left lumpectomy upfront, followed by Taxol/Herceptin x12 weekly cycles, Taxol was discontinued after 2 cycles due to toxicities. -Herceptin was continued x52 weeks and she was started on antiendocrine therapy beginning 06/2013. -She did not receive any adjuvant XRT.  She has been on Arimidex since 06/2013. -Bilateral diagnostic mammogram completed on 03/13/2020 was BI-RADS Category 2 benign. -Labs done on 03/13/2020 were all WNL -She will  continue on Arimidex daily. She will complete 10 years.  -She will follow up in 12 months with repeat labs and mammogram.  2.  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  ER negative/PR negative/HER-2 negative -She is status post right lumpectomy -MammaPrint was completed and she was identified to have a low risk disease.  Thus no adjuvant chemotherapy was recommended. -She did undergo  adjuvant XRT completed on 07/04/2016.  3.  Osteopenia: -This is in the setting of AI therapy. -Prolia started on 10/08/2015 and combination with calcium and vitamin D. -Bone density done on 03/02/2018 was reviewed and showed osteopenia. -Prolia will be continued every 6 months.  She will also continue calcium and vitamin D daily. -She will be due for her next Prolia injection in February 2022.   4.  Vitamin B12 deficiency: -Vitamin B12 level less than 50 today. -We will give vitamin B12 injections weekly x4 then monthly x6. -We will recheck labs in February 2021 during her Prolia and vitamin B12 injection (B12, CBC)  Disposition: Return to clinic weekly x4 and monthly x6 for B12 injections. RTC in 3 months for repeat lab work (B12), Prolia and vitamin B12 injection. RTC in 1 year for repeat lab work (CBC, CMP, vitamin B12, vitamin D, LDH) assessment and annual mammogram review. Orders placed today for mammogram.  Greater than 50% was spent in counseling and coordination of care with this patient including but not limited to discussion of the relevant topics above (See A&P) including, but not limited to diagnosis and management of acute and chronic medical conditions.   No problem-specific Assessment & Plan notes found for this encounter.  Orders placed this encounter:  Orders Placed This Encounter  Procedures  . MM DIAG BREAST TOMO BILATERAL   Faythe Casa, NP 03/15/2020 1:53 PM  [place] [phone]

## 2020-03-21 ENCOUNTER — Encounter (HOSPITAL_COMMUNITY): Payer: Self-pay

## 2020-03-21 ENCOUNTER — Inpatient Hospital Stay (HOSPITAL_COMMUNITY): Payer: Medicare Other

## 2020-03-21 ENCOUNTER — Other Ambulatory Visit: Payer: Self-pay

## 2020-03-21 VITALS — BP 135/74 | HR 85 | Temp 97.3°F | Resp 17

## 2020-03-21 DIAGNOSIS — Z79899 Other long term (current) drug therapy: Secondary | ICD-10-CM

## 2020-03-21 DIAGNOSIS — M8580 Other specified disorders of bone density and structure, unspecified site: Secondary | ICD-10-CM

## 2020-03-21 DIAGNOSIS — C50212 Malignant neoplasm of upper-inner quadrant of left female breast: Secondary | ICD-10-CM | POA: Diagnosis not present

## 2020-03-21 DIAGNOSIS — C50911 Malignant neoplasm of unspecified site of right female breast: Secondary | ICD-10-CM

## 2020-03-21 MED ORDER — CYANOCOBALAMIN 1000 MCG/ML IJ SOLN
1000.0000 ug | Freq: Once | INTRAMUSCULAR | Status: AC
  Administered 2020-03-21: 1000 ug via INTRAMUSCULAR
  Filled 2020-03-21: qty 1

## 2020-03-21 NOTE — Progress Notes (Signed)
Patient tolerated injection with no complaints voiced.  Site clean and dry with no bruising or swelling noted at site.  Band aid applied.  Vss with discharge and left in satisfactory condition with no s/s of distress noted.  

## 2020-03-29 ENCOUNTER — Other Ambulatory Visit: Payer: Self-pay

## 2020-03-29 ENCOUNTER — Inpatient Hospital Stay (HOSPITAL_COMMUNITY): Payer: Medicare Other | Attending: Hematology

## 2020-03-29 ENCOUNTER — Encounter (HOSPITAL_COMMUNITY): Payer: Self-pay

## 2020-03-29 VITALS — BP 100/54 | HR 88 | Temp 97.3°F | Resp 18

## 2020-03-29 DIAGNOSIS — Z79899 Other long term (current) drug therapy: Secondary | ICD-10-CM

## 2020-03-29 DIAGNOSIS — E538 Deficiency of other specified B group vitamins: Secondary | ICD-10-CM | POA: Insufficient documentation

## 2020-03-29 DIAGNOSIS — M8580 Other specified disorders of bone density and structure, unspecified site: Secondary | ICD-10-CM

## 2020-03-29 DIAGNOSIS — C50911 Malignant neoplasm of unspecified site of right female breast: Secondary | ICD-10-CM

## 2020-03-29 MED ORDER — SODIUM CHLORIDE 0.9 % IV SOLN: Freq: Once | INTRAVENOUS | Status: DC

## 2020-03-29 MED ORDER — CYANOCOBALAMIN 1000 MCG/ML IJ SOLN
1000.0000 ug | Freq: Once | INTRAMUSCULAR | Status: AC
  Administered 2020-03-29: 1000 ug via INTRAMUSCULAR
  Filled 2020-03-29: qty 1

## 2020-03-29 NOTE — Progress Notes (Signed)
Stacie Huang presents today for injection per the provider's orders.  B12  administration without incident; injection site WNL; see MAR for injection details.  Patient tolerated procedure well and without incident.  No questions or complaints noted at this time.  Discharged ambulatory in stable condition.

## 2020-04-05 ENCOUNTER — Other Ambulatory Visit: Payer: Self-pay

## 2020-04-05 ENCOUNTER — Inpatient Hospital Stay (HOSPITAL_COMMUNITY): Payer: Medicare Other

## 2020-04-05 ENCOUNTER — Encounter (HOSPITAL_COMMUNITY): Payer: Self-pay

## 2020-04-05 VITALS — BP 96/45 | HR 66 | Temp 97.3°F | Resp 16

## 2020-04-05 DIAGNOSIS — M8580 Other specified disorders of bone density and structure, unspecified site: Secondary | ICD-10-CM

## 2020-04-05 DIAGNOSIS — Z79899 Other long term (current) drug therapy: Secondary | ICD-10-CM

## 2020-04-05 DIAGNOSIS — E538 Deficiency of other specified B group vitamins: Secondary | ICD-10-CM | POA: Diagnosis not present

## 2020-04-05 DIAGNOSIS — C50911 Malignant neoplasm of unspecified site of right female breast: Secondary | ICD-10-CM

## 2020-04-05 MED ORDER — CYANOCOBALAMIN 1000 MCG/ML IJ SOLN
INTRAMUSCULAR | Status: AC
  Filled 2020-04-05: qty 1

## 2020-04-05 MED ORDER — CYANOCOBALAMIN 1000 MCG/ML IJ SOLN
1000.0000 ug | Freq: Once | INTRAMUSCULAR | Status: AC
  Administered 2020-04-05: 1000 ug via INTRAMUSCULAR

## 2020-04-05 NOTE — Progress Notes (Signed)
Stacie Huang presents today for injection per the provider's orders.  B12 administration without incident; injection site WNL; see MAR for injection details.  Patient tolerated procedure well and without incident.  No questions or complaints noted at this time.  Discharged ambulatory in stable condition.

## 2020-04-06 ENCOUNTER — Other Ambulatory Visit: Payer: Self-pay | Admitting: Cardiology

## 2020-04-06 DIAGNOSIS — I480 Paroxysmal atrial fibrillation: Secondary | ICD-10-CM

## 2020-04-25 ENCOUNTER — Ambulatory Visit (INDEPENDENT_AMBULATORY_CARE_PROVIDER_SITE_OTHER): Payer: Medicare Other | Admitting: Pharmacist

## 2020-04-25 ENCOUNTER — Other Ambulatory Visit: Payer: Self-pay

## 2020-04-25 DIAGNOSIS — Z5181 Encounter for therapeutic drug level monitoring: Secondary | ICD-10-CM

## 2020-04-25 DIAGNOSIS — I4891 Unspecified atrial fibrillation: Secondary | ICD-10-CM | POA: Diagnosis not present

## 2020-04-25 DIAGNOSIS — Z7901 Long term (current) use of anticoagulants: Secondary | ICD-10-CM | POA: Diagnosis not present

## 2020-04-25 LAB — POCT INR: INR: 2.1 (ref 2.0–3.0)

## 2020-04-25 NOTE — Patient Instructions (Signed)
Description   Continue coumadin 1/2 tablet daily except 1 tablet on Tuesdays and Fridays  Recheck in 7 weeks.

## 2020-05-07 ENCOUNTER — Inpatient Hospital Stay (HOSPITAL_COMMUNITY): Payer: Medicare Other | Attending: Hematology

## 2020-05-07 ENCOUNTER — Other Ambulatory Visit: Payer: Self-pay

## 2020-05-07 ENCOUNTER — Encounter (HOSPITAL_COMMUNITY): Payer: Self-pay

## 2020-05-07 VITALS — BP 110/59 | HR 94 | Temp 97.3°F | Resp 17

## 2020-05-07 DIAGNOSIS — M8580 Other specified disorders of bone density and structure, unspecified site: Secondary | ICD-10-CM

## 2020-05-07 DIAGNOSIS — E538 Deficiency of other specified B group vitamins: Secondary | ICD-10-CM | POA: Insufficient documentation

## 2020-05-07 DIAGNOSIS — Z79899 Other long term (current) drug therapy: Secondary | ICD-10-CM

## 2020-05-07 DIAGNOSIS — C50911 Malignant neoplasm of unspecified site of right female breast: Secondary | ICD-10-CM

## 2020-05-07 DIAGNOSIS — Z170 Estrogen receptor positive status [ER+]: Secondary | ICD-10-CM

## 2020-05-07 MED ORDER — CYANOCOBALAMIN 1000 MCG/ML IJ SOLN
1000.0000 ug | Freq: Once | INTRAMUSCULAR | Status: AC
  Administered 2020-05-07: 1000 ug via INTRAMUSCULAR
  Filled 2020-05-07: qty 1

## 2020-05-07 NOTE — Progress Notes (Signed)
Daylan A Wild tolerated Vit B 12injection well without complaints or incident. VSS Pt discharged self ambulatory in satisfactory condition

## 2020-05-07 NOTE — Patient Instructions (Signed)
[facility] at [facility] Discharge Instructions  Received Vit B12 injection today. Follow-up as scheduled   Thank you for choosing [facility] at [facility] to provide your oncology and hematology care.  To afford each patient quality time with our provider, please arrive at least 15 minutes before your scheduled appointment time.   If you have a lab appointment with the Cancer Center please come in thru the Main Entrance and check in at the main information desk.  You need to re-schedule your appointment should you arrive 10 or more minutes late.  We strive to give you quality time with our providers, and arriving late affects you and other patients whose appointments are after yours.  Also, if you no show three or more times for appointments you may be dismissed from the clinic at the providers discretion.     Again, thank you for choosing [facility].  Our hope is that these requests will decrease the amount of time that you wait before being seen by our physicians.       _____________________________________________________________  Should you have questions after your visit to [facility], please contact our office at [phone] and follow the prompts.  Our office hours are 8:00 a.m. and 4:30 p.m. Monday - Friday.  Please note that voicemails left after 4:00 p.m. may not be returned until the following business day.  We are closed weekends and major holidays.  You do have access to a nurse 24-7, just call the main number to the clinic [phone] and do not press any options, hold on the line and a nurse will answer the phone.    For prescription refill requests, have your pharmacy contact our office and allow 72 hours.    Due to Covid, you will need to wear a mask upon entering the hospital. If you do not have a mask, a mask will be given to you at the Main Entrance upon arrival. For doctor visits, patients may have  1 support person age 18 or older with them. For treatment visits, patients can not have anyone with them due to social distancing guidelines and our immunocompromised population.     

## 2020-06-01 ENCOUNTER — Ambulatory Visit (HOSPITAL_COMMUNITY): Payer: Medicare Other

## 2020-06-01 ENCOUNTER — Other Ambulatory Visit (HOSPITAL_COMMUNITY): Payer: Medicare Other

## 2020-06-06 ENCOUNTER — Other Ambulatory Visit (HOSPITAL_COMMUNITY): Payer: Self-pay | Admitting: *Deleted

## 2020-06-06 DIAGNOSIS — M8580 Other specified disorders of bone density and structure, unspecified site: Secondary | ICD-10-CM

## 2020-06-06 DIAGNOSIS — C50911 Malignant neoplasm of unspecified site of right female breast: Secondary | ICD-10-CM

## 2020-06-06 DIAGNOSIS — Z170 Estrogen receptor positive status [ER+]: Secondary | ICD-10-CM

## 2020-06-07 ENCOUNTER — Encounter (HOSPITAL_COMMUNITY): Payer: Self-pay

## 2020-06-07 ENCOUNTER — Other Ambulatory Visit: Payer: Self-pay

## 2020-06-07 ENCOUNTER — Inpatient Hospital Stay (HOSPITAL_COMMUNITY): Payer: Medicare Other | Attending: Hematology

## 2020-06-07 ENCOUNTER — Inpatient Hospital Stay (HOSPITAL_COMMUNITY): Payer: Medicare Other

## 2020-06-07 VITALS — BP 108/70 | HR 82 | Temp 97.3°F | Resp 16

## 2020-06-07 DIAGNOSIS — Z79899 Other long term (current) drug therapy: Secondary | ICD-10-CM

## 2020-06-07 DIAGNOSIS — M8580 Other specified disorders of bone density and structure, unspecified site: Secondary | ICD-10-CM | POA: Insufficient documentation

## 2020-06-07 DIAGNOSIS — C50911 Malignant neoplasm of unspecified site of right female breast: Secondary | ICD-10-CM

## 2020-06-07 DIAGNOSIS — E538 Deficiency of other specified B group vitamins: Secondary | ICD-10-CM | POA: Insufficient documentation

## 2020-06-07 DIAGNOSIS — Z170 Estrogen receptor positive status [ER+]: Secondary | ICD-10-CM

## 2020-06-07 LAB — COMPREHENSIVE METABOLIC PANEL
ALT: 16 U/L (ref 0–44)
AST: 21 U/L (ref 15–41)
Albumin: 3.8 g/dL (ref 3.5–5.0)
Alkaline Phosphatase: 38 U/L (ref 38–126)
Anion gap: 7 (ref 5–15)
BUN: 15 mg/dL (ref 8–23)
CO2: 24 mmol/L (ref 22–32)
Calcium: 9.2 mg/dL (ref 8.9–10.3)
Chloride: 104 mmol/L (ref 98–111)
Creatinine, Ser: 1.09 mg/dL — ABNORMAL HIGH (ref 0.44–1.00)
GFR, Estimated: 51 mL/min — ABNORMAL LOW (ref >60)
Glucose, Bld: 120 mg/dL — ABNORMAL HIGH (ref 70–99)
Potassium: 3.9 mmol/L (ref 3.5–5.1)
Sodium: 135 mmol/L (ref 135–145)
Total Bilirubin: 0.7 mg/dL (ref 0.3–1.2)
Total Protein: 6.9 g/dL (ref 6.5–8.1)

## 2020-06-07 MED ORDER — DENOSUMAB 60 MG/ML ~~LOC~~ SOSY
60.0000 mg | PREFILLED_SYRINGE | Freq: Once | SUBCUTANEOUS | Status: AC
  Administered 2020-06-07: 60 mg via SUBCUTANEOUS
  Filled 2020-06-07: qty 1

## 2020-06-07 MED ORDER — CYANOCOBALAMIN 1000 MCG/ML IJ SOLN
1000.0000 ug | Freq: Once | INTRAMUSCULAR | Status: AC
  Administered 2020-06-07: 1000 ug via INTRAMUSCULAR
  Filled 2020-06-07: qty 1

## 2020-06-07 NOTE — Patient Instructions (Signed)
[place] at [facility] Discharge Instructions  Received Prolia and Vit B12 injections today. Follow-up as scheduled   Thank you for choosing [place] at [facility] to provide your oncology and hematology care.  To afford each patient quality time with our provider, please arrive at least 15 minutes before your scheduled appointment time.   If you have a lab appointment with the [facility] please come in thru the Main Entrance and check in at the main information desk.  You need to re-schedule your appointment should you arrive 10 or more minutes late.  We strive to give you quality time with our providers, and arriving late affects you and other patients whose appointments are after yours.  Also, if you no show three or more times for appointments you may be dismissed from the clinic at the providers discretion.     Again, thank you for choosing [facility].  Our hope is that these requests will decrease the amount of time that you wait before being seen by our physicians.       _____________________________________________________________  Should you have questions after your visit to [facility], please contact our office at [phone] and follow the prompts.  Our office hours are 8:00 a.m. and 4:30 p.m. Monday - Friday.  Please note that voicemails left after 4:00 p.m. may not be returned until the following business day.  We are closed weekends and major holidays.  You do have access to a nurse 24-7, just call the main number to the clinic [phone] and do not press any options, hold on the line and a nurse will answer the phone.    For prescription refill requests, have your pharmacy contact our office and allow 72 hours.    Due to Covid, you will need to wear a mask upon entering the hospital. If you do not have a mask, a mask will be given to you at the Main Entrance upon arrival. For doctor visits,  patients may have 1 support person age 46 or older with them. For treatment visits, patients can not have anyone with them due to social distancing guidelines and our immunocompromised population.

## 2020-06-07 NOTE — Progress Notes (Signed)
Stacie Huang tolerated Prolia and Vit B12 injections well without complaints or incident. Calcium 9.2 today and pt denied any tooth or jaw pain and no recent or future dental visits prior to administering the Prolia injection. Pt continues to take her Calcium PO as prescribed without issues. VSS Pt discharged self ambulatory in satisfactory condition

## 2020-06-13 ENCOUNTER — Ambulatory Visit (INDEPENDENT_AMBULATORY_CARE_PROVIDER_SITE_OTHER): Payer: Medicare Other | Admitting: *Deleted

## 2020-06-13 DIAGNOSIS — Z5181 Encounter for therapeutic drug level monitoring: Secondary | ICD-10-CM

## 2020-06-13 DIAGNOSIS — I4891 Unspecified atrial fibrillation: Secondary | ICD-10-CM | POA: Diagnosis not present

## 2020-06-13 LAB — POCT INR: INR: 2 (ref 2.0–3.0)

## 2020-06-13 NOTE — Patient Instructions (Signed)
Continue coumadin 1/2 tablet daily except 1 tablet on Tuesdays and Fridays  Recheck in 7 weeks.

## 2020-07-05 ENCOUNTER — Other Ambulatory Visit (HOSPITAL_COMMUNITY): Payer: Self-pay | Admitting: Oncology

## 2020-07-05 ENCOUNTER — Other Ambulatory Visit: Payer: Self-pay

## 2020-07-05 ENCOUNTER — Inpatient Hospital Stay (HOSPITAL_COMMUNITY): Payer: Medicare Other | Attending: Hematology

## 2020-07-05 VITALS — BP 100/64 | HR 80 | Temp 97.1°F | Resp 16

## 2020-07-05 DIAGNOSIS — C50911 Malignant neoplasm of unspecified site of right female breast: Secondary | ICD-10-CM

## 2020-07-05 DIAGNOSIS — E538 Deficiency of other specified B group vitamins: Secondary | ICD-10-CM | POA: Insufficient documentation

## 2020-07-05 DIAGNOSIS — M8580 Other specified disorders of bone density and structure, unspecified site: Secondary | ICD-10-CM

## 2020-07-05 DIAGNOSIS — Z79899 Other long term (current) drug therapy: Secondary | ICD-10-CM

## 2020-07-05 MED ORDER — CYANOCOBALAMIN 1000 MCG/ML IJ SOLN
1000.0000 ug | Freq: Once | INTRAMUSCULAR | Status: AC
  Administered 2020-07-05: 1000 ug via INTRAMUSCULAR
  Filled 2020-07-05: qty 1

## 2020-07-05 NOTE — Progress Notes (Signed)
Patient tolerated injection with no complaints voiced.  Site clean and dry with no bruising or swelling noted at site.  See MAR for details.  Band aid applied.  Patient stable during and after injection.  Vss with discharge and left in satisfactory condition with no s/s of distress noted.  

## 2020-07-06 ENCOUNTER — Other Ambulatory Visit: Payer: Self-pay | Admitting: Cardiology

## 2020-07-06 DIAGNOSIS — I480 Paroxysmal atrial fibrillation: Secondary | ICD-10-CM

## 2020-07-06 NOTE — Telephone Encounter (Addendum)
  Lov: Branch, 09/12/2019  Tablets on hand: warfarin 2.5 mg  Due to have INR checked on 4/6.

## 2020-08-01 ENCOUNTER — Ambulatory Visit (INDEPENDENT_AMBULATORY_CARE_PROVIDER_SITE_OTHER): Payer: Medicare Other | Admitting: *Deleted

## 2020-08-01 DIAGNOSIS — Z5181 Encounter for therapeutic drug level monitoring: Secondary | ICD-10-CM

## 2020-08-01 DIAGNOSIS — I4891 Unspecified atrial fibrillation: Secondary | ICD-10-CM | POA: Diagnosis not present

## 2020-08-01 LAB — POCT INR: INR: 1.9 — AB (ref 2.0–3.0)

## 2020-08-01 NOTE — Patient Instructions (Signed)
Increase warfarin to 1/2 tablet daily except 1 tablet on Mondays, Wednesdays and Fridays  Recheck in 7 weeks.

## 2020-08-06 ENCOUNTER — Encounter (HOSPITAL_COMMUNITY): Payer: Self-pay

## 2020-08-06 ENCOUNTER — Other Ambulatory Visit: Payer: Self-pay

## 2020-08-06 ENCOUNTER — Other Ambulatory Visit (HOSPITAL_COMMUNITY): Payer: Self-pay | Admitting: Surgery

## 2020-08-06 ENCOUNTER — Inpatient Hospital Stay (HOSPITAL_COMMUNITY): Payer: Medicare Other | Attending: Hematology

## 2020-08-06 VITALS — BP 116/69 | HR 83 | Temp 96.9°F | Resp 18

## 2020-08-06 DIAGNOSIS — R110 Nausea: Secondary | ICD-10-CM

## 2020-08-06 DIAGNOSIS — C50911 Malignant neoplasm of unspecified site of right female breast: Secondary | ICD-10-CM

## 2020-08-06 DIAGNOSIS — Z79811 Long term (current) use of aromatase inhibitors: Secondary | ICD-10-CM

## 2020-08-06 DIAGNOSIS — M8580 Other specified disorders of bone density and structure, unspecified site: Secondary | ICD-10-CM

## 2020-08-06 DIAGNOSIS — Z170 Estrogen receptor positive status [ER+]: Secondary | ICD-10-CM

## 2020-08-06 DIAGNOSIS — E538 Deficiency of other specified B group vitamins: Secondary | ICD-10-CM | POA: Insufficient documentation

## 2020-08-06 DIAGNOSIS — Z79899 Other long term (current) drug therapy: Secondary | ICD-10-CM

## 2020-08-06 MED ORDER — ANASTROZOLE 1 MG PO TABS: 1.0000 mg | ORAL_TABLET | Freq: Every day | ORAL | 5 refills | Status: DC

## 2020-08-06 MED ORDER — CYANOCOBALAMIN 1000 MCG/ML IJ SOLN
1000.0000 ug | Freq: Once | INTRAMUSCULAR | Status: AC
  Administered 2020-08-06: 1000 ug via INTRAMUSCULAR
  Filled 2020-08-06: qty 1

## 2020-08-06 MED ORDER — METOCLOPRAMIDE HCL 10 MG PO TABS: 10.0000 mg | ORAL_TABLET | Freq: Three times a day (TID) | ORAL | 3 refills | Status: DC | PRN

## 2020-08-06 NOTE — Progress Notes (Signed)
Patient tolerated B12 injection with no complaints voiced.  Site clean and dry with no bruising or swelling noted at site.  Band aid applied.  VSS with discharge and left ambulatory with no s/s of distress noted.  Pt is taking Anastrazole as prescribed with occasional hot flashes.

## 2020-09-05 ENCOUNTER — Other Ambulatory Visit: Payer: Self-pay

## 2020-09-05 ENCOUNTER — Encounter (HOSPITAL_COMMUNITY): Payer: Self-pay

## 2020-09-05 ENCOUNTER — Inpatient Hospital Stay (HOSPITAL_COMMUNITY): Payer: Medicare Other | Attending: Hematology

## 2020-09-05 VITALS — BP 123/58 | HR 75 | Temp 97.2°F | Resp 18

## 2020-09-05 DIAGNOSIS — Z170 Estrogen receptor positive status [ER+]: Secondary | ICD-10-CM

## 2020-09-05 DIAGNOSIS — C50911 Malignant neoplasm of unspecified site of right female breast: Secondary | ICD-10-CM

## 2020-09-05 DIAGNOSIS — Z79899 Other long term (current) drug therapy: Secondary | ICD-10-CM

## 2020-09-05 DIAGNOSIS — E538 Deficiency of other specified B group vitamins: Secondary | ICD-10-CM | POA: Insufficient documentation

## 2020-09-05 DIAGNOSIS — M8580 Other specified disorders of bone density and structure, unspecified site: Secondary | ICD-10-CM

## 2020-09-05 MED ORDER — CYANOCOBALAMIN 1000 MCG/ML IJ SOLN
1000.0000 ug | Freq: Once | INTRAMUSCULAR | Status: AC
  Administered 2020-09-05: 1000 ug via INTRAMUSCULAR

## 2020-09-05 MED ORDER — CYANOCOBALAMIN 1000 MCG/ML IJ SOLN
INTRAMUSCULAR | Status: AC
  Filled 2020-09-05: qty 1

## 2020-09-05 NOTE — Progress Notes (Signed)
Pt here for monthly B12.  Last given 08/06/2020. Vital signs WNL for injection.  Stacie Huang presents today for injection per the provider's orders.  B12 in right arm administration without incident; injection site WNL; see MAR for injection details.  Patient tolerated procedure well and without incident.  No questions or complaints noted at this time.  Stable during and after procedure.  AVS reviewed.  Discharged in stable condition ambulatory.

## 2020-09-05 NOTE — Patient Instructions (Signed)
[facility]  Discharge Instructions: Thank you for choosing [facility] to provide your oncology and hematology care.  If you have a lab appointment with the [facility], please come in thru the Main Entrance and check in at the main information desk.  Wear comfortable clothing and clothing appropriate for easy access to any Portacath or PICC line.   We strive to give you quality time with your provider. You may need to reschedule your appointment if you arrive late (15 or more minutes).  Arriving late affects you and other patients whose appointments are after yours.  Also, if you miss three or more appointments without notifying the office, you may be dismissed from the clinic at the provider's discretion.      For prescription refill requests, have your pharmacy contact our office and allow 72 hours for refills to be completed.    B12 injection.  Follow up as scheduled.   To help prevent nausea and vomiting after your treatment, we encourage you to take your nausea medication as directed.  BELOW ARE SYMPTOMS THAT SHOULD BE REPORTED IMMEDIATELY: . *FEVER GREATER THAN 100.4 F (38 C) OR HIGHER . *CHILLS OR SWEATING . *NAUSEA AND VOMITING THAT IS NOT CONTROLLED WITH YOUR NAUSEA MEDICATION . *UNUSUAL SHORTNESS OF BREATH . *UNUSUAL BRUISING OR BLEEDING . *URINARY PROBLEMS (pain or burning when urinating, or frequent urination) . *BOWEL PROBLEMS (unusual diarrhea, constipation, pain near the anus) . TENDERNESS IN MOUTH AND THROAT WITH OR WITHOUT PRESENCE OF ULCERS (sore throat, sores in mouth, or a toothache) . UNUSUAL RASH, SWELLING OR PAIN  . UNUSUAL VAGINAL DISCHARGE OR ITCHING   Items with * indicate a potential emergency and should be followed up as soon as possible or go to the Emergency Department if any problems should occur.  Please show the CHEMOTHERAPY ALERT CARD or IMMUNOTHERAPY ALERT CARD at check-in to the Emergency Department and triage  nurse.  Should you have questions after your visit or need to cancel or reschedule your appointment, please contact [facility] [phone]  and follow the prompts.  Office hours are 8:00 a.m. to 4:30 p.m. Monday - Friday. Please note that voicemails left after 4:00 p.m. may not be returned until the following business day.  We are closed weekends and major holidays. You have access to a nurse at all times for urgent questions. Please call the main number to the clinic [phone] and follow the prompts.  For any non-urgent questions, you may also contact your provider using MyChart. We now offer e-Visits for anyone 81 and older to request care online for non-urgent symptoms. For details visit [email or web address].   Also download the MyChart app! Go to the app store, search "MyChart", open the app, select [facility], and log in with your MyChart username and password.  Due to Covid, a mask is required upon entering the hospital/clinic. If you do not have a mask, one will be given to you upon arrival. For doctor visits, patients may have 1 support person aged 81 or older with them. For treatment visits, patients cannot have anyone with them due to current Covid guidelines and our immunocompromised population.

## 2020-09-19 ENCOUNTER — Other Ambulatory Visit: Payer: Self-pay

## 2020-09-19 ENCOUNTER — Ambulatory Visit (INDEPENDENT_AMBULATORY_CARE_PROVIDER_SITE_OTHER): Payer: Medicare Other | Admitting: *Deleted

## 2020-09-19 DIAGNOSIS — Z5181 Encounter for therapeutic drug level monitoring: Secondary | ICD-10-CM

## 2020-09-19 DIAGNOSIS — I4891 Unspecified atrial fibrillation: Secondary | ICD-10-CM | POA: Diagnosis not present

## 2020-09-19 LAB — POCT INR: INR: 2.8 (ref 2.0–3.0)

## 2020-09-19 NOTE — Patient Instructions (Signed)
Continue warfarin 1/2 tablet daily except 1 tablet on Mondays, Wednesdays and Fridays  Recheck in 7 weeks.

## 2020-10-05 ENCOUNTER — Other Ambulatory Visit: Payer: Self-pay

## 2020-10-05 ENCOUNTER — Encounter (HOSPITAL_COMMUNITY): Payer: Self-pay

## 2020-10-05 ENCOUNTER — Inpatient Hospital Stay (HOSPITAL_COMMUNITY): Payer: Medicare Other | Attending: Hematology

## 2020-10-05 VITALS — BP 104/51 | HR 80 | Temp 97.0°F | Resp 18

## 2020-10-05 DIAGNOSIS — E538 Deficiency of other specified B group vitamins: Secondary | ICD-10-CM | POA: Diagnosis present

## 2020-10-05 DIAGNOSIS — Z79899 Other long term (current) drug therapy: Secondary | ICD-10-CM

## 2020-10-05 DIAGNOSIS — C50911 Malignant neoplasm of unspecified site of right female breast: Secondary | ICD-10-CM

## 2020-10-05 DIAGNOSIS — M8580 Other specified disorders of bone density and structure, unspecified site: Secondary | ICD-10-CM

## 2020-10-05 MED ORDER — CYANOCOBALAMIN 1000 MCG/ML IJ SOLN
INTRAMUSCULAR | Status: AC
  Filled 2020-10-05: qty 1

## 2020-10-05 MED ORDER — CYANOCOBALAMIN 1000 MCG/ML IJ SOLN
1000.0000 ug | Freq: Once | INTRAMUSCULAR | Status: AC
  Administered 2020-10-05: 1000 ug via INTRAMUSCULAR

## 2020-10-05 NOTE — Progress Notes (Signed)
Patient tolerated B12 injection with no complaints voiced. Site clean and dry with no bruising or swelling noted at site. See MAR for details. Band aid applied.  Patient stable during and after injection. VSS with discharge and left in satisfactory condition with no s/s of distress noted. 

## 2020-10-05 NOTE — Patient Instructions (Signed)
[facility]  Discharge Instructions: °Thank you for choosing [facility] to provide your oncology and hematology care.  °If you have a lab appointment with the Cancer Center, please come in thru the Main Entrance and check in at the main information desk. ° °Wear comfortable clothing and clothing appropriate for easy access to any Portacath or PICC line.  ° °We strive to give you quality time with your provider. You may need to reschedule your appointment if you arrive late (15 or more minutes).  Arriving late affects you and other patients whose appointments are after yours.  Also, if you miss three or more appointments without notifying the office, you may be dismissed from the clinic at the provider’s discretion.    °  °For prescription refill requests, have your pharmacy contact our office and allow 72 hours for refills to be completed.   ° °Today you received the following B12 injection.     °  °To help prevent nausea and vomiting after your treatment, we encourage you to take your nausea medication as directed. ° °BELOW ARE SYMPTOMS THAT SHOULD BE REPORTED IMMEDIATELY: °*FEVER GREATER THAN 100.4 F (38 °C) OR HIGHER °*CHILLS OR SWEATING °*NAUSEA AND VOMITING THAT IS NOT CONTROLLED WITH YOUR NAUSEA MEDICATION °*UNUSUAL SHORTNESS OF BREATH °*UNUSUAL BRUISING OR BLEEDING °*URINARY PROBLEMS (pain or burning when urinating, or frequent urination) °*BOWEL PROBLEMS (unusual diarrhea, constipation, pain near the anus) °TENDERNESS IN MOUTH AND THROAT WITH OR WITHOUT PRESENCE OF ULCERS (sore throat, sores in mouth, or a toothache) °UNUSUAL RASH, SWELLING OR PAIN  °UNUSUAL VAGINAL DISCHARGE OR ITCHING  ° °Items with * indicate a potential emergency and should be followed up as soon as possible or go to the Emergency Department if any problems should occur. ° °Please show the CHEMOTHERAPY ALERT CARD or IMMUNOTHERAPY ALERT CARD at check-in to the Emergency Department and triage nurse. ° °Should  you have questions after your visit or need to cancel or reschedule your appointment, please contact [facility] [phone]  and follow the prompts.  Office hours are 8:00 a.m. to 4:30 p.m. Monday - Friday. Please note that voicemails left after 4:00 p.m. may not be returned until the following business day.  We are closed weekends and major holidays. You have access to a nurse at all times for urgent questions. Please call the main number to the clinic [phone] and follow the prompts. ° °For any non-urgent questions, you may also contact your provider using MyChart. We now offer e-Visits for anyone 18 and older to request care online for non-urgent symptoms. For details visit [email or web address]. °  °Also download the MyChart app! Go to the app store, search "MyChart", open the app, select [facility], and log in with your MyChart username and password. ° °Due to Covid, a mask is required upon entering the hospital/clinic. If you do not have a mask, one will be given to you upon arrival. For doctor visits, patients may have 1 support person aged 18 or older with them. For treatment visits, patients cannot have anyone with them due to current Covid guidelines and our immunocompromised population.  °

## 2020-10-23 ENCOUNTER — Telehealth: Payer: Self-pay | Admitting: Cardiology

## 2020-10-23 MED ORDER — DILTIAZEM HCL ER COATED BEADS 120 MG PO CP24: 120.0000 mg | ORAL_CAPSULE | Freq: Every day | ORAL | 3 refills | Status: DC

## 2020-10-23 NOTE — Telephone Encounter (Signed)
Refilled cardizem per request,has f/u with MD this week.

## 2020-10-23 NOTE — Telephone Encounter (Signed)
New message     *STAT* If patient is at the pharmacy, call can be transferred to refill team.   1. Which medications need to be refilled? (please list name of each medication and dose if known) diltiazem (CARDIZEM CD) 120 MG 24 hr capsule  2. Which pharmacy/location (including street and city if local pharmacy) is medication to be sent to? [facility]  3. Do they need a 30 day or 90 day supply? [place]

## 2020-10-26 ENCOUNTER — Encounter: Payer: Self-pay | Admitting: Cardiology

## 2020-10-26 ENCOUNTER — Ambulatory Visit (INDEPENDENT_AMBULATORY_CARE_PROVIDER_SITE_OTHER): Payer: Medicare Other | Admitting: Cardiology

## 2020-10-26 ENCOUNTER — Telehealth: Payer: Self-pay

## 2020-10-26 ENCOUNTER — Other Ambulatory Visit: Payer: Self-pay

## 2020-10-26 ENCOUNTER — Telehealth: Payer: Self-pay | Admitting: Cardiology

## 2020-10-26 VITALS — BP 118/70 | HR 73 | Ht 61.0 in | Wt 141.0 lb

## 2020-10-26 DIAGNOSIS — I4891 Unspecified atrial fibrillation: Secondary | ICD-10-CM | POA: Diagnosis not present

## 2020-10-26 DIAGNOSIS — E782 Mixed hyperlipidemia: Secondary | ICD-10-CM

## 2020-10-26 DIAGNOSIS — I480 Paroxysmal atrial fibrillation: Secondary | ICD-10-CM

## 2020-10-26 DIAGNOSIS — I10 Essential (primary) hypertension: Secondary | ICD-10-CM | POA: Diagnosis not present

## 2020-10-26 MED ORDER — WARFARIN SODIUM 2.5 MG PO TABS: ORAL_TABLET | ORAL | 1 refills | Status: DC

## 2020-10-26 NOTE — Telephone Encounter (Signed)
2.5 mg (2.5 mg x 1) every Mon, Wed, Fri; 1.25 mg (2.5 mg x 0.5) all other days.   Spoke to [place] at [facility] and reiterated the above instructions as given in her note by Edrick Oh, RN (Coumadin Nurse) on 09/19/2020.

## 2020-10-26 NOTE — Telephone Encounter (Signed)
Please call Erlene Quan @ [facility] [phone]   He's needing clarification on her 43

## 2020-10-26 NOTE — Progress Notes (Signed)
Clinical Summary Stacie Huang is a 80 y.o.femaleseen today for follow up of the following medical problem.s    1. Afib  - not interested in NOACs, remains on coumadin.      - no recent palpitations - compliant with meds - no bleeding on coumadin   2. HTN - compliant with meds   3. Hyperlipidemia - labs followed by pcp, she is on statin   4. 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 90 tablet 5   atenolol (TENORMIN) 100 MG tablet Take 1 tablet (100 mg total) by mouth daily. 90 tablet 3   calcium carbonate (OS-CAL) 600 MG TABS tablet Take 600 mg by mouth 2 (two) times daily with a meal.      cetirizine (ZYRTEC) 10 MG tablet Take 10 mg by mouth every morning.     Cholecalciferol (VITAMIN D) 400 UNITS capsule Take 400 Units by mouth 2 (two) times daily.     Cyanocobalamin 2000 MCG/ML SOLN Inject 1 mL as directed. Patient injects once a month.  Is not sure of dose.     denosumab (PROLIA) 60 MG/ML SOLN injection Inject 60 mg into the skin every 6 (six) months. Administer in upper arm, thigh, or abdomen     diltiazem (CARDIZEM CD) 120 MG 24 hr capsule Take 1 capsule (120 mg total) by mouth daily. 90 capsule 3   fluticasone (FLONASE) 50 MCG/ACT nasal spray Place 2 sprays into both nostrils at bedtime.     LORazepam (ATIVAN) 1 MG tablet Take 1 mg by mouth at bedtime.     losartan (COZAAR) 50 MG  tablet Take 1 tablet (50 mg total) by mouth every morning. 90 tablet 3   metoCLOPramide (REGLAN) 10 MG tablet Take 1 tablet (10 mg total) by mouth every 8 (eight) hours as needed. for nausea 120 tablet 3   pantoprazole (PROTONIX) 40 MG tablet TAKE (1) TABLET BY MOUTH TWICE DAILY. 60 tablet 5   psyllium (METAMUCIL) 58.6 % powder Take 1 packet by mouth daily.      simvastatin (ZOCOR) 10 MG tablet Take 10 mg by mouth at bedtime.     warfarin (COUMADIN) 2.5 MG tablet TAKE 1/2 TABLET BY MOUTH DAILY EXCEPT 1 TABLET ON TUESDAYS AND FRIDAYS. 30 tablet 1   No current facility-administered medications for this visit.     Past Surgical History:  Procedure Laterality Date   ABDOMINAL HYSTERECTOMY     AXILLARY LYMPH NODE DISSECTION Left 02/23/2013   Procedure: LEFT AXILLARY LYMPH NODE DISSECTION;  Surgeon: Scherry Ran, MD;  Location: [facility];  Service: General;  Laterality: Left;   CHOLECYSTECTOMY     COLONOSCOPY  03/10/2002   RMR: Incomplete colonoscopy (sigmoidoscopy)/Normal rectum/Normal colon to 40 cm.    ESOPHAGOGASTRODUODENOSCOPY  03/10/2002   BJS:EGBTDV esophagus/small HH/Tiny AVM in antrum, otherwise  normal stomach and D1 and D2/Status post passage of the [facility] dilator   PARTIAL MASTECTOMY WITH AXILLARY SENTINEL LYMPH NODE BIOPSY Left 02/23/2013   Procedure: LEFT PARTIAL MASTECTOMY WITH AXILLARY SIMPLE NODE BIOPSY, LEFT BREAST WIDE EXCISION;  Surgeon: Scherry Ran, MD;  Location: [facility];  Service: General;  Laterality: Left;   PARTIAL MASTECTOMY WITH NEEDLE LOCALIZATION Right 04/02/2016   Procedure: RIGHT PARTIAL MASTECTOMY AFTER NEEDLE LOCALIZATION;  Surgeon: Aviva Signs, MD;  Location: [facility];  Service: General;  Laterality: Right;   PORT-A-CATH REMOVAL Right 07/31/2017   Procedure: MINOR REMOVAL PORT-A-CATH;  Surgeon: Aviva Signs, MD;  Location: [facility];  Service: General;  Laterality: Right;   PORTACATH PLACEMENT Right 04/07/2013   PORTACATH PLACEMENT Right 04/07/2013    Procedure: INSERTION PORT-A-CATH;  Surgeon: Scherry Ran, MD;  Location: [facility];  Service: General;  Laterality: Right;     Allergies  Allergen Reactions   Iohexol Hives   Sulfa Antibiotics Rash   Sulfasalazine Rash      Family History  Problem Relation Age of Onset   Heart disease Mother        Also 35 of 9 siblings   Heart attack Father    Leukemia Other    Leukemia Other    Colon cancer Neg Hx      Social History Stacie Huang reports that she has never smoked. She has never used smokeless tobacco. Stacie Huang reports no history of alcohol use.   Review of Systems CONSTITUTIONAL: No weight loss, fever, chills, weakness or fatigue.  HEENT: Eyes: No visual loss, blurred vision, double vision or yellow sclerae.No hearing loss, sneezing, congestion, runny nose or sore throat.  SKIN: No rash or itching.  CARDIOVASCULAR: per hpi RESPIRATORY: No shortness of breath, cough or sputum.  GASTROINTESTINAL: No anorexia, nausea, vomiting or diarrhea. No abdominal pain or blood.  GENITOURINARY: No burning on urination, no polyuria NEUROLOGICAL: No headache, dizziness, syncope, paralysis, ataxia, numbness or tingling in the extremities. No change in bowel or bladder control.  MUSCULOSKELETAL: No muscle, back pain, joint pain or stiffness.  LYMPHATICS: No enlarged nodes. No history of splenectomy.  PSYCHIATRIC: No history of depression or anxiety.  ENDOCRINOLOGIC: No reports of sweating, cold or heat intolerance. No polyuria or polydipsia.  Marland Kitchen   Physical Examination Today's Vitals   10/26/20 0952  BP: 118/70  Pulse: 73  SpO2: 96%  Weight: 141 lb (64 kg)  Height: 5\' 1"  (1.549 m)   Body mass index is 26.64 kg/m.  Gen: resting comfortably, no acute distress HEENT: no scleral icterus, pupils equal round and reactive, no palptable cervical adenopathy,  CV: irreg, no m/r/g, no jvd Resp: Clear to auscultation bilaterally GI: abdomen is soft, non-tender, non-distended, normal  bowel sounds, no hepatosplenomegaly MSK: extremities are warm, no edema.  Skin: warm, no rash Neuro:  no focal deficits Psych: appropriate affect   Diagnostic Studies     Assessment and Plan  1. Afib - no symptmos - EKG today shows rate controlled afib - has not been interested in NOACs, continue coumadin   2. HTN - at goal, continue current meds   3. Hyperlipidemia - request pcp labs, continue statin     Arnoldo Lenis, M.D

## 2020-10-26 NOTE — Telephone Encounter (Signed)
Pt needs a refill on her Warfarin 2.5 mg tablets. Pt had an appt today with Zandra Abts, MD

## 2020-10-26 NOTE — Patient Instructions (Signed)
Medication Instructions:  Your physician recommends that you continue on your current medications as directed. Please refer to the Current Medication list given to you today.  *If you need a refill on your cardiac medications before your next appointment, please call your pharmacy*   Lab Work: We have requested your most recent lab work from your primary care provider If you have labs (blood work) drawn today and your tests are completely normal, you will receive your results only by: [facility] (if you have MyChart) OR A paper copy in the mail If you have any lab test that is abnormal or we need to change your treatment, we will call you to review the results.   Testing/Procedures: None   Follow-Up: At [facility], you and your health needs are our priority.  As part of our continuing mission to provide you with exceptional heart care, we have created designated Provider Care Teams.  These Care Teams include your primary Cardiologist (physician) and Advanced Practice Providers (APPs -  Physician Assistants and Nurse Practitioners) who all work together to provide you with the care you need, when you need it.  We recommend signing up for the patient portal called "MyChart".  Sign up information is provided on this After Visit Summary.  MyChart is used to connect with patients for Virtual Visits (Telemedicine).  Patients are able to view lab/test results, encounter notes, upcoming appointments, etc.  Non-urgent messages can be sent to your provider as well.   To learn more about what you can do with MyChart, go to [email or web address].    Your next appointment:   12 month(s)  The format for your next appointment:   In Person  Provider:   Carlyle Dolly, MD   Other Instructions

## 2020-11-05 ENCOUNTER — Inpatient Hospital Stay (HOSPITAL_COMMUNITY): Payer: Medicare Other | Attending: Hematology

## 2020-11-05 ENCOUNTER — Other Ambulatory Visit: Payer: Self-pay

## 2020-11-05 VITALS — BP 108/75 | HR 80 | Temp 98.4°F | Resp 18

## 2020-11-05 DIAGNOSIS — Z170 Estrogen receptor positive status [ER+]: Secondary | ICD-10-CM

## 2020-11-05 DIAGNOSIS — C50911 Malignant neoplasm of unspecified site of right female breast: Secondary | ICD-10-CM

## 2020-11-05 DIAGNOSIS — M8580 Other specified disorders of bone density and structure, unspecified site: Secondary | ICD-10-CM

## 2020-11-05 DIAGNOSIS — E538 Deficiency of other specified B group vitamins: Secondary | ICD-10-CM | POA: Diagnosis present

## 2020-11-05 DIAGNOSIS — Z79899 Other long term (current) drug therapy: Secondary | ICD-10-CM

## 2020-11-05 MED ORDER — CYANOCOBALAMIN 1000 MCG/ML IJ SOLN
1000.0000 ug | Freq: Once | INTRAMUSCULAR | Status: AC
  Administered 2020-11-05: 1000 ug via INTRAMUSCULAR

## 2020-11-05 MED ORDER — CYANOCOBALAMIN 1000 MCG/ML IJ SOLN
INTRAMUSCULAR | Status: AC
  Filled 2020-11-05: qty 1

## 2020-11-05 NOTE — Progress Notes (Signed)
Stacie Huang presents today for B12 injection per the provider's orders.  Stable during administration without incident; injection site WNL; see MAR for injection details.  Patient tolerated procedure well and without incident.  No questions or complaints noted at this time. Discharge from clinic ambulatory in stable condition.  Alert and oriented X 3.  Follow up with [facility] as scheduled.

## 2020-11-05 NOTE — Patient Instructions (Signed)
[facility]  Discharge Instructions: Thank you for choosing [facility] to provide your oncology and hematology care.  If you have a lab appointment with the Cancer Center, please come in thru the Main Entrance and check in at the main information desk.  Wear comfortable clothing and clothing appropriate for easy access to any Portacath or PICC line.   We strive to give you quality time with your provider. You may need to reschedule your appointment if you arrive late (15 or more minutes).  Arriving late affects you and other patients whose appointments are after yours.  Also, if you miss three or more appointments without notifying the office, you may be dismissed from the clinic at the provider's discretion.      For prescription refill requests, have your pharmacy contact our office and allow 72 hours for refills to be completed.    Today you received the following chemotherapy and/or immunotherapy agents B12 injection      To help prevent nausea and vomiting after your treatment, we encourage you to take your nausea medication as directed.  BELOW ARE SYMPTOMS THAT SHOULD BE REPORTED IMMEDIATELY: *FEVER GREATER THAN 100.4 F (38 C) OR HIGHER *CHILLS OR SWEATING *NAUSEA AND VOMITING THAT IS NOT CONTROLLED WITH YOUR NAUSEA MEDICATION *UNUSUAL SHORTNESS OF BREATH *UNUSUAL BRUISING OR BLEEDING *URINARY PROBLEMS (pain or burning when urinating, or frequent urination) *BOWEL PROBLEMS (unusual diarrhea, constipation, pain near the anus) TENDERNESS IN MOUTH AND THROAT WITH OR WITHOUT PRESENCE OF ULCERS (sore throat, sores in mouth, or a toothache) UNUSUAL RASH, SWELLING OR PAIN  UNUSUAL VAGINAL DISCHARGE OR ITCHING   Items with * indicate a potential emergency and should be followed up as soon as possible or go to the Emergency Department if any problems should occur.  Please show the CHEMOTHERAPY ALERT CARD or IMMUNOTHERAPY ALERT CARD at check-in to the Emergency  Department and triage nurse.  Should you have questions after your visit or need to cancel or reschedule your appointment, please contact [facility] [phone]  and follow the prompts.  Office hours are 8:00 a.m. to 4:30 p.m. Monday - Friday. Please note that voicemails left after 4:00 p.m. may not be returned until the following business day.  We are closed weekends and major holidays. You have access to a nurse at all times for urgent questions. Please call the main number to the clinic [phone] and follow the prompts.  For any non-urgent questions, you may also contact your provider using MyChart. We now offer e-Visits for anyone 18 and older to request care online for non-urgent symptoms. For details visit [email or web address].   Also download the MyChart app! Go to the app store, search "MyChart", open the app, select [facility], and log in with your MyChart username and password.  Due to Covid, a mask is required upon entering the hospital/clinic. If you do not have a mask, one will be given to you upon arrival. For doctor visits, patients may have 1 support person aged 18 or older with them. For treatment visits, patients cannot have anyone with them due to current Covid guidelines and our immunocompromised population.  

## 2020-11-07 ENCOUNTER — Ambulatory Visit (INDEPENDENT_AMBULATORY_CARE_PROVIDER_SITE_OTHER): Payer: Medicare Other | Admitting: *Deleted

## 2020-11-07 DIAGNOSIS — I4891 Unspecified atrial fibrillation: Secondary | ICD-10-CM

## 2020-11-07 DIAGNOSIS — Z5181 Encounter for therapeutic drug level monitoring: Secondary | ICD-10-CM

## 2020-11-07 LAB — POCT INR: INR: 2.9 (ref 2.0–3.0)

## 2020-11-07 NOTE — Patient Instructions (Signed)
Continue warfarin 1/2 tablet daily except 1 tablet on Mondays, Wednesdays and Fridays  Recheck in 7 weeks.

## 2020-12-06 ENCOUNTER — Inpatient Hospital Stay (HOSPITAL_COMMUNITY): Payer: Medicare Other | Attending: Hematology

## 2020-12-06 ENCOUNTER — Ambulatory Visit (HOSPITAL_COMMUNITY): Payer: Medicare Other

## 2020-12-06 ENCOUNTER — Encounter (HOSPITAL_COMMUNITY): Payer: Self-pay

## 2020-12-06 VITALS — BP 124/79 | HR 55 | Temp 96.8°F | Resp 18

## 2020-12-06 DIAGNOSIS — C50911 Malignant neoplasm of unspecified site of right female breast: Secondary | ICD-10-CM

## 2020-12-06 DIAGNOSIS — E538 Deficiency of other specified B group vitamins: Secondary | ICD-10-CM | POA: Insufficient documentation

## 2020-12-06 DIAGNOSIS — Z79899 Other long term (current) drug therapy: Secondary | ICD-10-CM

## 2020-12-06 DIAGNOSIS — M8580 Other specified disorders of bone density and structure, unspecified site: Secondary | ICD-10-CM

## 2020-12-06 MED ORDER — CYANOCOBALAMIN 1000 MCG/ML IJ SOLN
1000.0000 ug | Freq: Once | INTRAMUSCULAR | Status: AC
Start: 2020-12-06 — End: 2020-12-06
  Administered 2020-12-06: 1000 ug via INTRAMUSCULAR
  Filled 2020-12-06: qty 1

## 2020-12-06 NOTE — Progress Notes (Signed)
Patient tolerated B12 injection with no complaints voiced. Site clean and dry with no bruising or swelling noted at site. See MAR for details. Band aid applied.  Patient stable during and after injection. VSS with discharge and left in satisfactory condition with no s/s of distress noted. 

## 2020-12-06 NOTE — Patient Instructions (Signed)
[facility]  Discharge Instructions: Thank you for choosing [facility] to provide your oncology and hematology care.  If you have a lab appointment with the [facility], please come in thru the Main Entrance and check in at the main information desk.  Wear comfortable clothing and clothing appropriate for easy access to any Portacath or PICC line.   We strive to give you quality time with your provider. You may need to reschedule your appointment if you arrive late (15 or more minutes).  Arriving late affects you and other patients whose appointments are after yours.  Also, if you miss three or more appointments without notifying the office, you may be dismissed from the clinic at the provider's discretion.      For prescription refill requests, have your pharmacy contact our office and allow 72 hours for refills to be completed.    Today you received the following B12 injection, return as scheduled.   To help prevent nausea and vomiting after your treatment, we encourage you to take your nausea medication as directed.  BELOW ARE SYMPTOMS THAT SHOULD BE REPORTED IMMEDIATELY: *FEVER GREATER THAN 100.4 F (38 C) OR HIGHER *CHILLS OR SWEATING *NAUSEA AND VOMITING THAT IS NOT CONTROLLED WITH YOUR NAUSEA MEDICATION *UNUSUAL SHORTNESS OF BREATH *UNUSUAL BRUISING OR BLEEDING *URINARY PROBLEMS (pain or burning when urinating, or frequent urination) *BOWEL PROBLEMS (unusual diarrhea, constipation, pain near the anus) TENDERNESS IN MOUTH AND THROAT WITH OR WITHOUT PRESENCE OF ULCERS (sore throat, sores in mouth, or a toothache) UNUSUAL RASH, SWELLING OR PAIN  UNUSUAL VAGINAL DISCHARGE OR ITCHING   Items with * indicate a potential emergency and should be followed up as soon as possible or go to the Emergency Department if any problems should occur.  Please show the CHEMOTHERAPY ALERT CARD or IMMUNOTHERAPY ALERT CARD at check-in to the Emergency Department and triage  nurse.  Should you have questions after your visit or need to cancel or reschedule your appointment, please contact [facility] [phone]  and follow the prompts.  Office hours are 8:00 a.m. to 4:30 p.m. Monday - Friday. Please note that voicemails left after 4:00 p.m. may not be returned until the following business day.  We are closed weekends and major holidays. You have access to a nurse at all times for urgent questions. Please call the main number to the clinic [phone] and follow the prompts.  For any non-urgent questions, you may also contact your provider using MyChart. We now offer e-Visits for anyone 81 and older to request care online for non-urgent symptoms. For details visit [email or web address].   Also download the MyChart app! Go to the app store, search "MyChart", open the app, select [facility], and log in with your MyChart username and password.  Due to Covid, a mask is required upon entering the hospital/clinic. If you do not have a mask, one will be given to you upon arrival. For doctor visits, patients may have 1 support person aged 81 or older with them. For treatment visits, patients cannot have anyone with them due to current Covid guidelines and our immunocompromised population.

## 2020-12-26 ENCOUNTER — Other Ambulatory Visit: Payer: Self-pay | Admitting: Cardiology

## 2020-12-26 ENCOUNTER — Other Ambulatory Visit: Payer: Self-pay

## 2020-12-26 ENCOUNTER — Ambulatory Visit (INDEPENDENT_AMBULATORY_CARE_PROVIDER_SITE_OTHER): Payer: Medicare Other | Admitting: *Deleted

## 2020-12-26 DIAGNOSIS — Z5181 Encounter for therapeutic drug level monitoring: Secondary | ICD-10-CM

## 2020-12-26 DIAGNOSIS — I480 Paroxysmal atrial fibrillation: Secondary | ICD-10-CM

## 2020-12-26 DIAGNOSIS — I4891 Unspecified atrial fibrillation: Secondary | ICD-10-CM | POA: Diagnosis not present

## 2020-12-26 LAB — POCT INR: INR: 2.7 (ref 2.0–3.0)

## 2020-12-26 MED ORDER — WARFARIN SODIUM 2.5 MG PO TABS: ORAL_TABLET | ORAL | 5 refills | Status: DC

## 2020-12-26 NOTE — Patient Instructions (Signed)
Continue warfarin 1/2 tablet daily except 1 tablet on Mondays, Wednesdays and Fridays  Recheck in 7 weeks.

## 2021-01-04 ENCOUNTER — Other Ambulatory Visit (HOSPITAL_COMMUNITY): Payer: Self-pay | Admitting: Surgery

## 2021-01-04 DIAGNOSIS — M8580 Other specified disorders of bone density and structure, unspecified site: Secondary | ICD-10-CM

## 2021-01-04 DIAGNOSIS — C50911 Malignant neoplasm of unspecified site of right female breast: Secondary | ICD-10-CM

## 2021-01-04 DIAGNOSIS — Z170 Estrogen receptor positive status [ER+]: Secondary | ICD-10-CM

## 2021-01-07 ENCOUNTER — Inpatient Hospital Stay (HOSPITAL_COMMUNITY): Payer: Medicare Other

## 2021-01-07 ENCOUNTER — Other Ambulatory Visit (HOSPITAL_COMMUNITY): Payer: Self-pay | Admitting: *Deleted

## 2021-01-07 ENCOUNTER — Ambulatory Visit (HOSPITAL_COMMUNITY): Payer: Medicare Other

## 2021-01-07 DIAGNOSIS — M8580 Other specified disorders of bone density and structure, unspecified site: Secondary | ICD-10-CM

## 2021-01-08 ENCOUNTER — Other Ambulatory Visit: Payer: Self-pay

## 2021-01-08 ENCOUNTER — Inpatient Hospital Stay (HOSPITAL_COMMUNITY): Payer: Medicare Other

## 2021-01-08 ENCOUNTER — Encounter (HOSPITAL_COMMUNITY): Payer: Self-pay

## 2021-01-08 ENCOUNTER — Inpatient Hospital Stay (HOSPITAL_COMMUNITY): Payer: Medicare Other | Attending: Hematology

## 2021-01-08 VITALS — BP 117/72 | HR 84 | Temp 98.4°F | Resp 18

## 2021-01-08 DIAGNOSIS — Z79899 Other long term (current) drug therapy: Secondary | ICD-10-CM

## 2021-01-08 DIAGNOSIS — C50911 Malignant neoplasm of unspecified site of right female breast: Secondary | ICD-10-CM

## 2021-01-08 DIAGNOSIS — Z170 Estrogen receptor positive status [ER+]: Secondary | ICD-10-CM

## 2021-01-08 DIAGNOSIS — M810 Age-related osteoporosis without current pathological fracture: Secondary | ICD-10-CM | POA: Insufficient documentation

## 2021-01-08 DIAGNOSIS — E538 Deficiency of other specified B group vitamins: Secondary | ICD-10-CM | POA: Diagnosis not present

## 2021-01-08 DIAGNOSIS — M8580 Other specified disorders of bone density and structure, unspecified site: Secondary | ICD-10-CM

## 2021-01-08 LAB — COMPREHENSIVE METABOLIC PANEL
ALT: 20 U/L (ref 0–44)
AST: 23 U/L (ref 15–41)
Albumin: 4 g/dL (ref 3.5–5.0)
Alkaline Phosphatase: 51 U/L (ref 38–126)
Anion gap: 7 (ref 5–15)
BUN: 16 mg/dL (ref 8–23)
CO2: 24 mmol/L (ref 22–32)
Calcium: 9.2 mg/dL (ref 8.9–10.3)
Chloride: 107 mmol/L (ref 98–111)
Creatinine, Ser: 1.27 mg/dL — ABNORMAL HIGH (ref 0.44–1.00)
GFR, Estimated: 42 mL/min — ABNORMAL LOW (ref >60)
Glucose, Bld: 110 mg/dL — ABNORMAL HIGH (ref 70–99)
Potassium: 4 mmol/L (ref 3.5–5.1)
Sodium: 138 mmol/L (ref 135–145)
Total Bilirubin: 0.3 mg/dL (ref 0.3–1.2)
Total Protein: 6.9 g/dL (ref 6.5–8.1)

## 2021-01-08 LAB — VITAMIN B12: Vitamin B-12: 160 pg/mL — ABNORMAL LOW (ref 180–914)

## 2021-01-08 MED ORDER — DENOSUMAB 60 MG/ML ~~LOC~~ SOSY
60.0000 mg | PREFILLED_SYRINGE | Freq: Once | SUBCUTANEOUS | Status: AC
  Administered 2021-01-08: 60 mg via SUBCUTANEOUS
  Filled 2021-01-08: qty 1

## 2021-01-08 MED ORDER — CYANOCOBALAMIN 1000 MCG/ML IJ SOLN
1000.0000 ug | Freq: Once | INTRAMUSCULAR | Status: AC
  Administered 2021-01-08: 1000 ug via INTRAMUSCULAR
  Filled 2021-01-08: qty 1

## 2021-01-08 NOTE — Progress Notes (Signed)
Patient presents today for Retacrit injection. Hemoglobin reviewed prior to administration. VSS tolerated without incident or complaint. See MAR for details. Patient stable during and after injection. Patient discharged in satisfactory condition with no s/s of distress noted.  

## 2021-01-08 NOTE — Patient Instructions (Signed)
[facility]  Discharge Instructions: Thank you for choosing [facility] to provide your oncology and hematology care.  If you have a lab appointment with the [facility], please come in thru the Main Entrance and check in at the main information desk.  Wear comfortable clothing and clothing appropriate for easy access to any Portacath or PICC line.   We strive to give you quality time with your provider. You may need to reschedule your appointment if you arrive late (15 or more minutes).  Arriving late affects you and other patients whose appointments are after yours.  Also, if you miss three or more appointments without notifying the office, you may be dismissed from the clinic at the provider's discretion.      For prescription refill requests, have your pharmacy contact our office and allow 72 hours for refills to be completed.    Today you received the following Vitamin B12 and Prolia, return as scheduled.    To help prevent nausea and vomiting after your treatment, we encourage you to take your nausea medication as directed.  BELOW ARE SYMPTOMS THAT SHOULD BE REPORTED IMMEDIATELY: *FEVER GREATER THAN 100.4 F (38 C) OR HIGHER *CHILLS OR SWEATING *NAUSEA AND VOMITING THAT IS NOT CONTROLLED WITH YOUR NAUSEA MEDICATION *UNUSUAL SHORTNESS OF BREATH *UNUSUAL BRUISING OR BLEEDING *URINARY PROBLEMS (pain or burning when urinating, or frequent urination) *BOWEL PROBLEMS (unusual diarrhea, constipation, pain near the anus) TENDERNESS IN MOUTH AND THROAT WITH OR WITHOUT PRESENCE OF ULCERS (sore throat, sores in mouth, or a toothache) UNUSUAL RASH, SWELLING OR PAIN  UNUSUAL VAGINAL DISCHARGE OR ITCHING   Items with * indicate a potential emergency and should be followed up as soon as possible or go to the Emergency Department if any problems should occur.  Please show the CHEMOTHERAPY ALERT CARD or IMMUNOTHERAPY ALERT CARD at check-in to the Emergency Department and  triage nurse.  Should you have questions after your visit or need to cancel or reschedule your appointment, please contact [facility] [phone]  and follow the prompts.  Office hours are 8:00 a.m. to 4:30 p.m. Monday - Friday. Please note that voicemails left after 4:00 p.m. may not be returned until the following business day.  We are closed weekends and major holidays. You have access to a nurse at all times for urgent questions. Please call the main number to the clinic [phone] and follow the prompts.  For any non-urgent questions, you may also contact your provider using MyChart. We now offer e-Visits for anyone 66 and older to request care online for non-urgent symptoms. For details visit [email or web address].   Also download the MyChart app! Go to the app store, search "MyChart", open the app, select [facility], and log in with your MyChart username and password.  Due to Covid, a mask is required upon entering the hospital/clinic. If you do not have a mask, one will be given to you upon arrival. For doctor visits, patients may have 1 support person aged 39 or older with them. For treatment visits, patients cannot have anyone with them due to current Covid guidelines and our immunocompromised population.

## 2021-02-06 ENCOUNTER — Encounter (HOSPITAL_COMMUNITY): Payer: Self-pay

## 2021-02-06 ENCOUNTER — Inpatient Hospital Stay (HOSPITAL_COMMUNITY): Payer: Medicare Other | Attending: Hematology

## 2021-02-06 ENCOUNTER — Other Ambulatory Visit: Payer: Self-pay

## 2021-02-06 VITALS — BP 119/58 | HR 77 | Temp 98.5°F | Resp 16

## 2021-02-06 DIAGNOSIS — C50911 Malignant neoplasm of unspecified site of right female breast: Secondary | ICD-10-CM

## 2021-02-06 DIAGNOSIS — E538 Deficiency of other specified B group vitamins: Secondary | ICD-10-CM | POA: Insufficient documentation

## 2021-02-06 DIAGNOSIS — M8580 Other specified disorders of bone density and structure, unspecified site: Secondary | ICD-10-CM

## 2021-02-06 DIAGNOSIS — Z79899 Other long term (current) drug therapy: Secondary | ICD-10-CM

## 2021-02-06 MED ORDER — CYANOCOBALAMIN 1000 MCG/ML IJ SOLN
1000.0000 ug | Freq: Once | INTRAMUSCULAR | Status: AC
  Administered 2021-02-06: 1000 ug via INTRAMUSCULAR
  Filled 2021-02-06: qty 1

## 2021-02-13 ENCOUNTER — Other Ambulatory Visit (HOSPITAL_COMMUNITY): Payer: Self-pay | Admitting: Oncology

## 2021-02-13 ENCOUNTER — Ambulatory Visit (INDEPENDENT_AMBULATORY_CARE_PROVIDER_SITE_OTHER): Payer: Medicare Other | Admitting: *Deleted

## 2021-02-13 DIAGNOSIS — Z5181 Encounter for therapeutic drug level monitoring: Secondary | ICD-10-CM | POA: Diagnosis not present

## 2021-02-13 DIAGNOSIS — Z9889 Other specified postprocedural states: Secondary | ICD-10-CM

## 2021-02-13 DIAGNOSIS — I4891 Unspecified atrial fibrillation: Secondary | ICD-10-CM

## 2021-02-13 LAB — POCT INR: INR: 3.7 — AB (ref 2.0–3.0)

## 2021-02-13 NOTE — Patient Instructions (Signed)
Hold warfarin tonight then resume 1/2 tablet daily except 1 tablet on Mondays, Wednesdays and Fridays  Recheck in 4 weeks.

## 2021-02-26 ENCOUNTER — Other Ambulatory Visit (HOSPITAL_COMMUNITY): Payer: Self-pay | Admitting: Hematology

## 2021-02-26 DIAGNOSIS — R110 Nausea: Secondary | ICD-10-CM

## 2021-02-26 DIAGNOSIS — Z79811 Long term (current) use of aromatase inhibitors: Secondary | ICD-10-CM

## 2021-03-11 ENCOUNTER — Ambulatory Visit (HOSPITAL_COMMUNITY): Payer: Medicare Other | Admitting: Hematology

## 2021-03-11 ENCOUNTER — Other Ambulatory Visit: Payer: Self-pay

## 2021-03-11 ENCOUNTER — Other Ambulatory Visit (HOSPITAL_COMMUNITY): Payer: Medicare Other

## 2021-03-11 ENCOUNTER — Inpatient Hospital Stay (HOSPITAL_COMMUNITY): Payer: Medicare Other | Attending: Hematology

## 2021-03-11 ENCOUNTER — Ambulatory Visit (HOSPITAL_COMMUNITY): Payer: Medicare Other

## 2021-03-11 VITALS — BP 112/65 | HR 49 | Temp 98.2°F | Resp 18

## 2021-03-11 DIAGNOSIS — E538 Deficiency of other specified B group vitamins: Secondary | ICD-10-CM | POA: Diagnosis present

## 2021-03-11 DIAGNOSIS — C50911 Malignant neoplasm of unspecified site of right female breast: Secondary | ICD-10-CM

## 2021-03-11 DIAGNOSIS — M8580 Other specified disorders of bone density and structure, unspecified site: Secondary | ICD-10-CM

## 2021-03-11 DIAGNOSIS — Z79899 Other long term (current) drug therapy: Secondary | ICD-10-CM

## 2021-03-11 MED ORDER — CYANOCOBALAMIN 1000 MCG/ML IJ SOLN
1000.0000 ug | Freq: Once | INTRAMUSCULAR | Status: AC
  Administered 2021-03-11: 1000 ug via INTRAMUSCULAR
  Filled 2021-03-11: qty 1

## 2021-03-11 NOTE — Progress Notes (Signed)
Patient tolerated Vitamin B 12 injection with no complaints voiced.  Site clean and dry with no bruising or swelling noted.  No complaints of pain.  Discharged with vital signs stable and no signs or symptoms of distress noted.   ?

## 2021-03-11 NOTE — Patient Instructions (Signed)
[facility]  Discharge Instructions: Thank you for choosing [facility] to provide your oncology and hematology care.  If you have a lab appointment with the Cancer Center, please come in thru the Main Entrance and check in at the main information desk.  Wear comfortable clothing and clothing appropriate for easy access to any Portacath or PICC line.   We strive to give you quality time with your provider. You may need to reschedule your appointment if you arrive late (15 or more minutes).  Arriving late affects you and other patients whose appointments are after yours.  Also, if you miss three or more appointments without notifying the office, you may be dismissed from the clinic at the provider's discretion.      For prescription refill requests, have your pharmacy contact our office and allow 72 hours for refills to be completed.        To help prevent nausea and vomiting after your treatment, we encourage you to take your nausea medication as directed.  BELOW ARE SYMPTOMS THAT SHOULD BE REPORTED IMMEDIATELY: *FEVER GREATER THAN 100.4 F (38 C) OR HIGHER *CHILLS OR SWEATING *NAUSEA AND VOMITING THAT IS NOT CONTROLLED WITH YOUR NAUSEA MEDICATION *UNUSUAL SHORTNESS OF BREATH *UNUSUAL BRUISING OR BLEEDING *URINARY PROBLEMS (pain or burning when urinating, or frequent urination) *BOWEL PROBLEMS (unusual diarrhea, constipation, pain near the anus) TENDERNESS IN MOUTH AND THROAT WITH OR WITHOUT PRESENCE OF ULCERS (sore throat, sores in mouth, or a toothache) UNUSUAL RASH, SWELLING OR PAIN  UNUSUAL VAGINAL DISCHARGE OR ITCHING   Items with * indicate a potential emergency and should be followed up as soon as possible or go to the Emergency Department if any problems should occur.  Please show the CHEMOTHERAPY ALERT CARD or IMMUNOTHERAPY ALERT CARD at check-in to the Emergency Department and triage nurse.  Should you have questions after your visit or need to cancel  or reschedule your appointment, please contact [facility] [phone]  and follow the prompts.  Office hours are 8:00 a.m. to 4:30 p.m. Monday - Friday. Please note that voicemails left after 4:00 p.m. may not be returned until the following business day.  We are closed weekends and major holidays. You have access to a nurse at all times for urgent questions. Please call the main number to the clinic [phone] and follow the prompts.  For any non-urgent questions, you may also contact your provider using MyChart. We now offer e-Visits for anyone 18 and older to request care online for non-urgent symptoms. For details visit [email or web address].   Also download the MyChart app! Go to the app store, search "MyChart", open the app, select , and log in with your MyChart username and password.  Due to Covid, a mask is required upon entering the hospital/clinic. If you do not have a mask, one will be given to you upon arrival. For doctor visits, patients may have 1 support person aged 18 or older with them. For treatment visits, patients cannot have anyone with them due to current Covid guidelines and our immunocompromised population.  

## 2021-03-13 ENCOUNTER — Ambulatory Visit (INDEPENDENT_AMBULATORY_CARE_PROVIDER_SITE_OTHER): Payer: Medicare Other | Admitting: *Deleted

## 2021-03-13 DIAGNOSIS — Z5181 Encounter for therapeutic drug level monitoring: Secondary | ICD-10-CM

## 2021-03-13 DIAGNOSIS — I4891 Unspecified atrial fibrillation: Secondary | ICD-10-CM | POA: Diagnosis not present

## 2021-03-13 LAB — POCT INR: INR: 3.2 — AB (ref 2.0–3.0)

## 2021-03-13 NOTE — Patient Instructions (Signed)
Decrease warfarin to 1/2 tablet daily except 1 tablet on Mondays and Fridays  Recheck in 4 weeks.

## 2021-03-18 ENCOUNTER — Other Ambulatory Visit (HOSPITAL_COMMUNITY): Payer: Self-pay

## 2021-03-18 DIAGNOSIS — C50911 Malignant neoplasm of unspecified site of right female breast: Secondary | ICD-10-CM

## 2021-03-18 NOTE — Progress Notes (Signed)
Orders placed per Faythe Casa, NP note from 03/15/2020.

## 2021-03-19 ENCOUNTER — Ambulatory Visit (HOSPITAL_COMMUNITY)
Admission: RE | Admit: 2021-03-19 | Discharge: 2021-03-19 | Disposition: A | Discharge status: Home / Self Care | Payer: Medicare Other | Source: Ambulatory Visit | Attending: Oncology | Admitting: Oncology

## 2021-03-19 ENCOUNTER — Inpatient Hospital Stay (HOSPITAL_COMMUNITY): Payer: Medicare Other

## 2021-03-19 ENCOUNTER — Other Ambulatory Visit: Payer: Self-pay

## 2021-03-19 DIAGNOSIS — E538 Deficiency of other specified B group vitamins: Secondary | ICD-10-CM | POA: Diagnosis not present

## 2021-03-19 DIAGNOSIS — C50911 Malignant neoplasm of unspecified site of right female breast: Secondary | ICD-10-CM

## 2021-03-19 DIAGNOSIS — Z853 Personal history of malignant neoplasm of breast: Secondary | ICD-10-CM | POA: Insufficient documentation

## 2021-03-19 DIAGNOSIS — Z08 Encounter for follow-up examination after completed treatment for malignant neoplasm: Secondary | ICD-10-CM | POA: Insufficient documentation

## 2021-03-19 DIAGNOSIS — Z9889 Other specified postprocedural states: Secondary | ICD-10-CM

## 2021-03-19 LAB — COMPREHENSIVE METABOLIC PANEL
ALT: 19 U/L (ref 0–44)
AST: 24 U/L (ref 15–41)
Albumin: 4.2 g/dL (ref 3.5–5.0)
Alkaline Phosphatase: 45 U/L (ref 38–126)
Anion gap: 7 (ref 5–15)
BUN: 14 mg/dL (ref 8–23)
CO2: 23 mmol/L (ref 22–32)
Calcium: 9.3 mg/dL (ref 8.9–10.3)
Chloride: 107 mmol/L (ref 98–111)
Creatinine, Ser: 1.11 mg/dL — ABNORMAL HIGH (ref 0.44–1.00)
GFR, Estimated: 50 mL/min — ABNORMAL LOW (ref >60)
Glucose, Bld: 88 mg/dL (ref 70–99)
Potassium: 4.1 mmol/L (ref 3.5–5.1)
Sodium: 137 mmol/L (ref 135–145)
Total Bilirubin: 0.6 mg/dL (ref 0.3–1.2)
Total Protein: 7.3 g/dL (ref 6.5–8.1)

## 2021-03-19 LAB — VITAMIN D 25 HYDROXY (VIT D DEFICIENCY, FRACTURES): Vit D, 25-Hydroxy: 60.15 ng/mL (ref 30–100)

## 2021-03-19 LAB — CBC WITH DIFFERENTIAL/PLATELET
Abs Immature Granulocytes: 0.06 10*3/uL (ref 0.00–0.07)
Basophils Absolute: 0.1 10*3/uL (ref 0.0–0.1)
Basophils Relative: 1 %
Eosinophils Absolute: 0.2 10*3/uL (ref 0.0–0.5)
Eosinophils Relative: 2 %
HCT: 40.8 % (ref 36.0–46.0)
Hemoglobin: 13.6 g/dL (ref 12.0–15.0)
Immature Granulocytes: 1 %
Lymphocytes Relative: 34 %
Lymphs Abs: 2.3 10*3/uL (ref 0.7–4.0)
MCH: 30.5 pg (ref 26.0–34.0)
MCHC: 33.3 g/dL (ref 30.0–36.0)
MCV: 91.5 fL (ref 80.0–100.0)
Monocytes Absolute: 0.8 10*3/uL (ref 0.1–1.0)
Monocytes Relative: 11 %
Neutro Abs: 3.4 10*3/uL (ref 1.7–7.7)
Neutrophils Relative %: 51 %
Platelets: 287 10*3/uL (ref 150–400)
RBC: 4.46 MIL/uL (ref 3.87–5.11)
RDW: 13.2 % (ref 11.5–15.5)
WBC: 6.7 10*3/uL (ref 4.0–10.5)
nRBC: 0 % (ref 0.0–0.2)

## 2021-03-19 LAB — VITAMIN B12: Vitamin B-12: 386 pg/mL (ref 180–914)

## 2021-03-19 LAB — LACTATE DEHYDROGENASE: LDH: 152 U/L (ref 98–192)

## 2021-04-08 ENCOUNTER — Ambulatory Visit (HOSPITAL_COMMUNITY): Payer: Medicare Other

## 2021-04-08 ENCOUNTER — Ambulatory Visit (HOSPITAL_COMMUNITY): Payer: Medicare Other | Admitting: Hematology

## 2021-04-09 NOTE — Progress Notes (Signed)
Stacie Huang, Stacie Huang   Patient Care Team: Lemmie Evens, MD as PCP - General (Family Medicine) Harl Bowie, Alphonse Guild, MD as PCP - Cardiology (Cardiology) Felicie Morn, MD (Inactive) as Consulting Physician (General Surgery) Gala Romney Cristopher Estimable, MD as Consulting Physician (Gastroenterology)  SUMMARY OF ONCOLOGIC HISTORY: Oncology History  Malignant neoplasm of right female breast ([facility])  02/04/2013 Initial Diagnosis   Breast cancer of upper-inner quadrant of left female breast   02/23/2013 Surgery   left lumpectomy (UIQ), sentinel node biopsy--0.7cm, Node negative, ER+, PR+, HER-2/neu over expressed.   04/08/2013 - 04/14/2013 Chemotherapy   Paclitaxel/Herceptin weekly x 12. Intolerance to Paclitaxel and only receiving 2 cycles   05/04/2013 - 04/05/2014 Chemotherapy   Herceptin every 21 days    06/28/2013 -  Chemotherapy   Anastrazole started   08/02/2013 Imaging   Bone density- normal   04/06/2014 Imaging   MUGA- Left ventricular ejection fraction equals 53%.   08/06/2015 Imaging   Bone density- osteopenia   03/06/2016 Procedure   Stereotactic-guided biopsy of right breast upper outer quadrant focal asymmetry/calcifications. No apparent complications.   03/06/2016 Procedure   Successful placement of coil shaped marker within the right breast upper outer quadrant biopsy site, post stereotactic core needle biopsy.   Invasive ductal carcinoma of breast, female, right ([place])  02/26/2016 Mammogram   1. Focal asymmetry within the upper-outer quadrant of the right breast, with associated new calcifications. This is a suspicious finding for which stereotactic biopsy with 3D tomosynthesis guidance is recommended. 2. Cluster of cysts at the 10 o'clock axis, 5 cm from the nipple, measuring 1.2 x 0.6 x 1.1 cm, a possible correlate for the mammographic asymmetry.   03/06/2016 Procedure   Right needle core biopsy   03/07/2016 Pathology  Results   Breast, right, needle core biopsy, upper outer quadrant - HIGH GRADE DUCTAL CARCINOMA IN SITU WITH NECROSIS AND ASSOCIATED CALCIFICATIONS.IMMUNOHISTOCHEMICAL AND MORPHOMETRIC ANALYSIS PERFORMED MANUALLY Estrogen Receptor: 0%, NEGATIVE Progesterone Receptor: 0%, NEGATIVE   04/02/2016 Procedure   Breast, lumpectomy, right by Dr. Arnoldo Morale   04/07/2016 Pathology Results   INVASIVE DUCTAL CARCINOMA, GRADE 2, SPANNING 0.6 CM THE CARCINOMA IS FOCALLY PRESENTED AT THE CAUTERIZED MEDIAL MARGIN EXTENSIVE DUCTAL CARCINOMA IN SITU, GRADE 3 ALL OTHER SURGICAL MARGIN ARE NEGATIVE FOR CARCINOMA ER 0% PR 0% Ki-67 15% HER2 NEGATIVE   05/05/2016 Pathology Results   MammaPrint: LOW RISK (97.8% of low risk Mammaprint patients who were treated with anti-hormonal therapy alone are living without distant recurrence of breast cancer at 5-years).   06/02/2016 - 07/04/2016 Radiation Therapy   XRT in [place], [place].  Right breast 42.56 Gy delivered in 16 fractions at 2.65 Gy per fraction and a right tumor bed boost 16 Gy delivered in 8 fractions at 2 Gy per fraction for a total dose of 58.56 Gy completed on 07/04/2016.     CHIEF COMPLIANT: Follow-up for breast cancer   INTERVAL HISTORY: [name] is a 80 y.o. female here today for follow up of her breast cancer. Her last visit was on 03/15/2020.   Today she reports feeling good. She is taking anastrozole and tolerating it well. She takes calcium and vitamin D.   REVIEW OF SYSTEMS:   Review of Systems  Constitutional:  Negative for appetite change and fatigue (50%).  Gastrointestinal:  Positive for nausea.  All other systems reviewed and are negative.  I have reviewed the past medical history, past surgical history, social history and family history  with the patient and they are unchanged from previous note.   ALLERGIES:   is allergic to iohexol, sulfa antibiotics, and sulfasalazine.   MEDICATIONS:  Current Outpatient Medications  Medication  Sig Dispense Refill   anastrozole (ARIMIDEX) 1 MG tablet Take 1 tablet (1 mg total) by mouth daily. 90 tablet 5   atenolol (TENORMIN) 100 MG tablet Take 1 tablet (100 mg total) by mouth daily. 90 tablet 3   calcium carbonate (OS-CAL) 600 MG TABS tablet Take 600 mg by mouth 2 (two) times daily with a meal.      cetirizine (ZYRTEC) 10 MG tablet Take 10 mg by mouth every morning.     Cholecalciferol (VITAMIN D) 400 UNITS capsule Take 400 Units by mouth 2 (two) times daily.     Cyanocobalamin 2000 MCG/ML SOLN Inject 1 mL as directed. Patient injects once a month.  Is not sure of dose.     denosumab (PROLIA) 60 MG/ML SOLN injection Inject 60 mg into the skin every 6 (six) months. Administer in upper arm, thigh, or abdomen     diltiazem (CARDIZEM CD) 120 MG 24 hr capsule Take 1 capsule (120 mg total) by mouth daily. 90 capsule 3   fluticasone (FLONASE) 50 MCG/ACT nasal spray Place 2 sprays into both nostrils at bedtime.     LORazepam (ATIVAN) 1 MG tablet Take 1 mg by mouth at bedtime.     metoCLOPramide (REGLAN) 10 MG tablet TAKE 1 TABLET BY MOUTH EVERY 8 HOURS AS NEEDED FOR NAUSEA. 120 tablet 0   pantoprazole (PROTONIX) 40 MG tablet TAKE (1) TABLET BY MOUTH TWICE DAILY. 60 tablet 5   psyllium (METAMUCIL) 58.6 % powder Take 1 packet by mouth daily.      simvastatin (ZOCOR) 10 MG tablet Take 10 mg by mouth at bedtime.     warfarin (COUMADIN) 2.5 MG tablet TAKE 1/2 TABLET BY MOUTH DAILY EXCEPT 1 TABLET ON MONDAY, WEDNESDAY, AND FRIDAYS. 30 tablet 5   No current facility-administered medications for this visit.     PHYSICAL EXAMINATION: Performance status (ECOG): 1 - Symptomatic but completely ambulatory  Vitals:   04/10/21 1309  BP: 118/73  Pulse: (!) 104  Resp: 18  Temp: 98.3 F (36.8 C)  SpO2: 97%   Wt Readings from Last 3 Encounters:  04/10/21 144 lb (65.3 kg)  10/26/20 141 lb (64 kg)  03/15/20 143 lb 11.2 oz (65.2 kg)   Physical Exam Vitals reviewed.  Constitutional:       Appearance: Normal appearance.  Cardiovascular:     Rate and Rhythm: Normal rate and regular rhythm.     Pulses: Normal pulses.     Heart sounds: Normal heart sounds.  Pulmonary:     Effort: Pulmonary effort is normal.     Breath sounds: Normal breath sounds.  Chest:  Breasts:    Right: Normal. No swelling, bleeding, inverted nipple, mass, nipple discharge, skin change (UOQ scar well healed) or tenderness.     Left: Normal. No swelling, bleeding, inverted nipple, mass, nipple discharge, skin change (UIQ scar well healed) or tenderness.  Abdominal:     Palpations: Abdomen is soft. There is no hepatomegaly, splenomegaly or mass.     Tenderness: There is no abdominal tenderness.  Musculoskeletal:     Right lower leg: No edema.     Left lower leg: No edema.  Lymphadenopathy:     Upper Body:     Right upper body: No supraclavicular, axillary or pectoral adenopathy.     Left upper  body: No supraclavicular, axillary or pectoral adenopathy.  Neurological:     General: No focal deficit present.     Mental Status: She is alert and oriented to person, place, and time.  Psychiatric:        Mood and Affect: Mood normal.        Behavior: Behavior normal.    Breast Exam Chaperone: Thana Ates     LABORATORY DATA:  I have reviewed the data as listed CMP Latest Ref Rng & Units 03/19/2021 01/08/2021 06/07/2020  Glucose 70 - 99 mg/dL 88 110(H) 120(H)  BUN 8 - 23 mg/dL '14 16 15  ' Creatinine 0.44 - 1.00 mg/dL 1.11(H) 1.27(H) 1.09(H)  Sodium 135 - 145 mmol/L 137 138 135  Potassium 3.5 - 5.1 mmol/L 4.1 4.0 3.9  Chloride 98 - 111 mmol/L 107 107 104  CO2 22 - 32 mmol/L '23 24 24  ' Calcium 8.9 - 10.3 mg/dL 9.3 9.2 9.2  Total Protein 6.5 - 8.1 g/dL 7.3 6.9 6.9  Total Bilirubin 0.3 - 1.2 mg/dL 0.6 0.3 0.7  Alkaline Phos 38 - 126 U/L 45 51 38  AST 15 - 41 U/L '24 23 21  ' ALT 0 - 44 U/L '19 20 16   ' No results found for: PQD826 Lab Results  Component Value Date   WBC 6.7 03/19/2021   HGB 13.6  03/19/2021   HCT 40.8 03/19/2021   MCV 91.5 03/19/2021   PLT 287 03/19/2021   NEUTROABS 3.4 03/19/2021    ASSESSMENT:  1.  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 - She was treated with left lumpectomy followed by Taxol and Herceptin x12 weekly cycles.  Taxol discontinued after 2 cycles due to toxicities.  Herceptin was continued for 52 weeks. - Anastrozole started in March 2015.    2.  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  Status post right lumpectomy revealing TN BC. - MammaPrint showed low risk disease. - No adjuvant chemotherapy was recommended.  Adjuvant XRT completed on 07/04/2016.    PLAN:  1.  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 - Physical examination today showed left breast lumpectomy scar in the upper inner quadrant unchanged.  No palpable masses or adenopathy. - Mammogram on 03/19/2021 was BI-RADS Category 2. - Continue anastrozole to complete 10 years. - RTC 1 year for follow-up with repeat mammogram and labs.    2.  Stage Ia (T1BN0M0) right breast TNBC: - No evidence of recurrence at this time. - Reviewed labs from 03/19/2021 which showed normal LFTs and CBC.    3.  Osteopenia: - Vitamin D level was 60.  DEXA scan on 03/02/2018 with T score of -1.5. - Continue calcium and vitamin D. - We will consider repeating DEXA scan at next visit.     4.  Vitamin B12 deficiency: - Vitamin B12 was 386.  Continue B12 injections monthly.   Breast Cancer therapy associated bone loss: I have recommended calcium, Vitamin D and weight bearing exercises.  Orders placed this encounter:  No orders of the defined types were placed in this encounter.   The patient has a good understanding of the overall plan. She agrees with it. She will call with any  problems that may develop before the next visit here.  Derek Jack, MD [facility] [phone]   I, Delma Freeze  Evans, am acting as a scribe for Dr. Derek Jack.  I, Derek Jack MD, have reviewed the above documentation for accuracy and completeness, and I agree with the above.

## 2021-04-10 ENCOUNTER — Other Ambulatory Visit: Payer: Self-pay

## 2021-04-10 ENCOUNTER — Inpatient Hospital Stay (HOSPITAL_COMMUNITY): Payer: Medicare Other | Attending: Hematology | Admitting: Hematology

## 2021-04-10 ENCOUNTER — Ambulatory Visit (INDEPENDENT_AMBULATORY_CARE_PROVIDER_SITE_OTHER): Payer: Medicare Other | Admitting: *Deleted

## 2021-04-10 ENCOUNTER — Other Ambulatory Visit (HOSPITAL_COMMUNITY): Payer: Self-pay | Admitting: *Deleted

## 2021-04-10 ENCOUNTER — Inpatient Hospital Stay (HOSPITAL_COMMUNITY): Payer: Medicare Other

## 2021-04-10 VITALS — BP 118/73 | HR 104 | Temp 98.3°F | Resp 18 | Ht 61.0 in | Wt 144.0 lb

## 2021-04-10 DIAGNOSIS — E538 Deficiency of other specified B group vitamins: Secondary | ICD-10-CM | POA: Insufficient documentation

## 2021-04-10 DIAGNOSIS — C50911 Malignant neoplasm of unspecified site of right female breast: Secondary | ICD-10-CM

## 2021-04-10 DIAGNOSIS — Z79899 Other long term (current) drug therapy: Secondary | ICD-10-CM

## 2021-04-10 DIAGNOSIS — I4891 Unspecified atrial fibrillation: Secondary | ICD-10-CM | POA: Diagnosis not present

## 2021-04-10 DIAGNOSIS — M8580 Other specified disorders of bone density and structure, unspecified site: Secondary | ICD-10-CM

## 2021-04-10 DIAGNOSIS — Z79811 Long term (current) use of aromatase inhibitors: Secondary | ICD-10-CM | POA: Diagnosis not present

## 2021-04-10 DIAGNOSIS — C50212 Malignant neoplasm of upper-inner quadrant of left female breast: Secondary | ICD-10-CM | POA: Diagnosis not present

## 2021-04-10 DIAGNOSIS — Z5181 Encounter for therapeutic drug level monitoring: Secondary | ICD-10-CM | POA: Diagnosis not present

## 2021-04-10 DIAGNOSIS — Z923 Personal history of irradiation: Secondary | ICD-10-CM | POA: Diagnosis not present

## 2021-04-10 DIAGNOSIS — Z170 Estrogen receptor positive status [ER+]: Secondary | ICD-10-CM

## 2021-04-10 LAB — POCT INR: INR: 3 (ref 2.0–3.0)

## 2021-04-10 MED ORDER — CYANOCOBALAMIN 1000 MCG/ML IJ SOLN
1000.0000 ug | Freq: Once | INTRAMUSCULAR | Status: AC
  Administered 2021-04-10: 14:00:00 1000 ug via INTRAMUSCULAR
  Filled 2021-04-10: qty 1

## 2021-04-10 NOTE — Progress Notes (Signed)
Message received from A [facility] RN / Dr. Delton Coombes to proceed with b12 injection.  Stacie Huang presents today for injection per the provider's orders.  B12  administration without incident; injection site WNL; see MAR for injection details.  Patient tolerated procedure well and without incident.  No questions or complaints noted at this time. Discharged from clinic ambulatory in stable condition. Alert and oriented x 3. F/U with [facility] as scheduled.

## 2021-04-10 NOTE — Patient Instructions (Signed)
Continue warfarin 1/2 tablet daily except 1 tablet on Mondays and Fridays  Recheck in 4 weeks.

## 2021-05-02 ENCOUNTER — Encounter (HOSPITAL_COMMUNITY): Payer: Self-pay | Admitting: Hematology

## 2021-05-06 ENCOUNTER — Encounter (HOSPITAL_COMMUNITY): Payer: Self-pay | Admitting: Hematology

## 2021-05-09 ENCOUNTER — Ambulatory Visit (INDEPENDENT_AMBULATORY_CARE_PROVIDER_SITE_OTHER): Payer: Medicare Other | Admitting: *Deleted

## 2021-05-09 DIAGNOSIS — Z5181 Encounter for therapeutic drug level monitoring: Secondary | ICD-10-CM

## 2021-05-09 DIAGNOSIS — I4891 Unspecified atrial fibrillation: Secondary | ICD-10-CM | POA: Diagnosis not present

## 2021-05-09 LAB — POCT INR: INR: 2.7 (ref 2.0–3.0)

## 2021-05-09 NOTE — Patient Instructions (Signed)
Continue warfarin 1/2 tablet daily except 1 tablet on Mondays and Fridays  Recheck in 6 weeks.   

## 2021-05-13 ENCOUNTER — Inpatient Hospital Stay (HOSPITAL_COMMUNITY): Payer: Medicare Other | Attending: Hematology

## 2021-05-13 ENCOUNTER — Encounter (HOSPITAL_COMMUNITY): Payer: Self-pay

## 2021-05-13 ENCOUNTER — Other Ambulatory Visit: Payer: Self-pay

## 2021-05-13 VITALS — BP 106/66 | HR 96 | Temp 99.1°F | Resp 18

## 2021-05-13 DIAGNOSIS — E538 Deficiency of other specified B group vitamins: Secondary | ICD-10-CM | POA: Insufficient documentation

## 2021-05-13 DIAGNOSIS — Z79899 Other long term (current) drug therapy: Secondary | ICD-10-CM

## 2021-05-13 DIAGNOSIS — Z170 Estrogen receptor positive status [ER+]: Secondary | ICD-10-CM

## 2021-05-13 DIAGNOSIS — M8580 Other specified disorders of bone density and structure, unspecified site: Secondary | ICD-10-CM

## 2021-05-13 DIAGNOSIS — C50911 Malignant neoplasm of unspecified site of right female breast: Secondary | ICD-10-CM

## 2021-05-13 MED ORDER — CYANOCOBALAMIN 1000 MCG/ML IJ SOLN
1000.0000 ug | Freq: Once | INTRAMUSCULAR | Status: AC
  Administered 2021-05-13: 1000 ug via INTRAMUSCULAR
  Filled 2021-05-13: qty 1

## 2021-05-13 NOTE — Progress Notes (Signed)
B12 injection  given per orders. Patient tolerated it well without problems. Vitals stable and discharged home from clinic ambulatory. Follow up as scheduled.  

## 2021-05-13 NOTE — Patient Instructions (Signed)
[facility]  Discharge Instructions: ?Thank you for choosing [facility] to provide your oncology and hematology care.  ?If you have a lab appointment with the Cancer Center, please come in thru the Main Entrance and check in at the main information desk. ? ? ? ?We strive to give you quality time with your provider. You may need to reschedule your appointment if you arrive late (15 or more minutes).  Arriving late affects you and other patients whose appointments are after yours.  Also, if you miss three or more appointments without notifying the office, you may be dismissed from the clinic at the provider?s discretion.    ?  ?For prescription refill requests, have your pharmacy contact our office and allow 72 hours for refills to be completed.   ? ?  ?To help prevent nausea and vomiting after your treatment, we encourage you to take your nausea medication as directed. ? ?BELOW ARE SYMPTOMS THAT SHOULD BE REPORTED IMMEDIATELY: ?*FEVER GREATER THAN 100.4 F (38 ?C) OR HIGHER ?*CHILLS OR SWEATING ?*NAUSEA AND VOMITING THAT IS NOT CONTROLLED WITH YOUR NAUSEA MEDICATION ?*UNUSUAL SHORTNESS OF BREATH ?*UNUSUAL BRUISING OR BLEEDING ?*URINARY PROBLEMS (pain or burning when urinating, or frequent urination) ?*BOWEL PROBLEMS (unusual diarrhea, constipation, pain near the anus) ?TENDERNESS IN MOUTH AND THROAT WITH OR WITHOUT PRESENCE OF ULCERS (sore throat, sores in mouth, or a toothache) ?UNUSUAL RASH, SWELLING OR PAIN  ?UNUSUAL VAGINAL DISCHARGE OR ITCHING  ? ?Items with * indicate a potential emergency and should be followed up as soon as possible or go to the Emergency Department if any problems should occur. ? ?Please show the CHEMOTHERAPY ALERT CARD or IMMUNOTHERAPY ALERT CARD at check-in to the Emergency Department and triage nurse. ? ?Should you have questions after your visit or need to cancel or reschedule your appointment, please contact [facility] [phone]  and follow  the prompts.  Office hours are 8:00 a.m. to 4:30 p.m. Monday - Friday. Please note that voicemails left after 4:00 p.m. may not be returned until the following business day.  We are closed weekends and major holidays. You have access to a nurse at all times for urgent questions. Please call the main number to the clinic [phone] and follow the prompts. ? ?For any non-urgent questions, you may also contact your provider using MyChart. We now offer e-Visits for anyone 18 and older to request care online for non-urgent symptoms. For details visit [email or web address]. ?  ?Also download the MyChart app! Go to the app store, search "MyChart", open the app, select [facility], and log in with your MyChart username and password. ? ?Due to Covid, a mask is required upon entering the hospital/clinic. If you do not have a mask, one will be given to you upon arrival. For doctor visits, patients may have 1 support person aged 18 or older with them. For treatment visits, patients cannot have anyone with them due to current Covid guidelines and our immunocompromised population.  ?

## 2021-06-06 ENCOUNTER — Encounter (HOSPITAL_COMMUNITY): Payer: Self-pay | Admitting: Hematology

## 2021-06-12 ENCOUNTER — Encounter (HOSPITAL_COMMUNITY): Payer: Self-pay | Admitting: Hematology

## 2021-06-13 ENCOUNTER — Inpatient Hospital Stay (HOSPITAL_COMMUNITY): Payer: Medicare Other | Attending: Hematology

## 2021-06-13 ENCOUNTER — Other Ambulatory Visit: Payer: Self-pay

## 2021-06-13 VITALS — BP 126/74 | HR 80 | Temp 98.7°F | Resp 18

## 2021-06-13 DIAGNOSIS — E538 Deficiency of other specified B group vitamins: Secondary | ICD-10-CM | POA: Diagnosis present

## 2021-06-13 DIAGNOSIS — Z79899 Other long term (current) drug therapy: Secondary | ICD-10-CM

## 2021-06-13 DIAGNOSIS — M8580 Other specified disorders of bone density and structure, unspecified site: Secondary | ICD-10-CM

## 2021-06-13 DIAGNOSIS — C50911 Malignant neoplasm of unspecified site of right female breast: Secondary | ICD-10-CM

## 2021-06-13 MED ORDER — CYANOCOBALAMIN 1000 MCG/ML IJ SOLN
1000.0000 ug | Freq: Once | INTRAMUSCULAR | Status: AC
  Administered 2021-06-13: 1000 ug via INTRAMUSCULAR
  Filled 2021-06-13: qty 1

## 2021-06-13 NOTE — Progress Notes (Signed)
Patient tolerated injection with no complaints voiced.  Site clean and dry with no bruising or swelling noted at site.  See MAR for details.  Band aid applied.  Patient stable during and after injection.  Vss with discharge and left in satisfactory condition with no s/s of distress noted.  

## 2021-06-13 NOTE — Patient Instructions (Signed)
[facility]  Discharge Instructions: Thank you for choosing [facility] to provide your oncology and hematology care.  If you have a lab appointment with the Cancer Center, please come in thru the Main Entrance and check in at the main information desk.  Wear comfortable clothing and clothing appropriate for easy access to any Portacath or PICC line.   We strive to give you quality time with your provider. You may need to reschedule your appointment if you arrive late (15 or more minutes).  Arriving late affects you and other patients whose appointments are after yours.  Also, if you miss three or more appointments without notifying the office, you may be dismissed from the clinic at the provider's discretion.      For prescription refill requests, have your pharmacy contact our office and allow 72 hours for refills to be completed.        To help prevent nausea and vomiting after your treatment, we encourage you to take your nausea medication as directed.  BELOW ARE SYMPTOMS THAT SHOULD BE REPORTED IMMEDIATELY: *FEVER GREATER THAN 100.4 F (38 C) OR HIGHER *CHILLS OR SWEATING *NAUSEA AND VOMITING THAT IS NOT CONTROLLED WITH YOUR NAUSEA MEDICATION *UNUSUAL SHORTNESS OF BREATH *UNUSUAL BRUISING OR BLEEDING *URINARY PROBLEMS (pain or burning when urinating, or frequent urination) *BOWEL PROBLEMS (unusual diarrhea, constipation, pain near the anus) TENDERNESS IN MOUTH AND THROAT WITH OR WITHOUT PRESENCE OF ULCERS (sore throat, sores in mouth, or a toothache) UNUSUAL RASH, SWELLING OR PAIN  UNUSUAL VAGINAL DISCHARGE OR ITCHING   Items with * indicate a potential emergency and should be followed up as soon as possible or go to the Emergency Department if any problems should occur.  Please show the CHEMOTHERAPY ALERT CARD or IMMUNOTHERAPY ALERT CARD at check-in to the Emergency Department and triage nurse.  Should you have questions after your visit or need to cancel  or reschedule your appointment, please contact  CANCER CENTER [phone]  and follow the prompts.  Office hours are 8:00 a.m. to 4:30 p.m. Monday - Friday. Please note that voicemails left after 4:00 p.m. may not be returned until the following business day.  We are closed weekends and major holidays. You have access to a nurse at all times for urgent questions. Please call the main number to the clinic [phone] and follow the prompts.  For any non-urgent questions, you may also contact your provider using MyChart. We now offer e-Visits for anyone 18 and older to request care online for non-urgent symptoms. For details visit [email or web address].   Also download the MyChart app! Go to the app store, search "MyChart", open the app, select [facility], and log in with your MyChart username and password.  Due to Covid, a mask is required upon entering the hospital/clinic. If you do not have a mask, one will be given to you upon arrival. For doctor visits, patients may have 1 support person aged 18 or older with them. For treatment visits, patients cannot have anyone with them due to current Covid guidelines and our immunocompromised population.  

## 2021-06-20 ENCOUNTER — Ambulatory Visit (INDEPENDENT_AMBULATORY_CARE_PROVIDER_SITE_OTHER): Payer: Medicare Other | Admitting: *Deleted

## 2021-06-20 DIAGNOSIS — Z5181 Encounter for therapeutic drug level monitoring: Secondary | ICD-10-CM | POA: Diagnosis not present

## 2021-06-20 DIAGNOSIS — I4891 Unspecified atrial fibrillation: Secondary | ICD-10-CM

## 2021-06-20 LAB — POCT INR: INR: 2.2 (ref 2.0–3.0)

## 2021-06-20 NOTE — Patient Instructions (Signed)
Continue warfarin 1/2 tablet daily except 1 tablet on Mondays and Fridays  Recheck in 6 weeks.   

## 2021-06-25 ENCOUNTER — Other Ambulatory Visit (HOSPITAL_COMMUNITY): Payer: Self-pay | Admitting: Hematology

## 2021-06-25 DIAGNOSIS — R110 Nausea: Secondary | ICD-10-CM

## 2021-06-25 DIAGNOSIS — Z79811 Long term (current) use of aromatase inhibitors: Secondary | ICD-10-CM

## 2021-06-28 ENCOUNTER — Encounter (HOSPITAL_COMMUNITY): Payer: Self-pay | Admitting: Hematology

## 2021-07-11 ENCOUNTER — Encounter (HOSPITAL_COMMUNITY): Payer: Self-pay

## 2021-07-11 ENCOUNTER — Other Ambulatory Visit: Payer: Self-pay

## 2021-07-11 ENCOUNTER — Inpatient Hospital Stay (HOSPITAL_COMMUNITY): Payer: Medicare Other | Attending: Hematology

## 2021-07-11 VITALS — BP 118/88 | HR 63 | Temp 96.7°F | Resp 18

## 2021-07-11 DIAGNOSIS — E538 Deficiency of other specified B group vitamins: Secondary | ICD-10-CM | POA: Insufficient documentation

## 2021-07-11 MED ORDER — CYANOCOBALAMIN 1000 MCG/ML IJ SOLN
1000.0000 ug | Freq: Once | INTRAMUSCULAR | Status: AC
  Administered 2021-07-11: 1000 ug via INTRAMUSCULAR
  Filled 2021-07-11: qty 1

## 2021-07-11 NOTE — Progress Notes (Signed)
Patient tolerated injection with no complaints voiced. Site clean and dry with no bruising or swelling noted at site. See MAR for details. Band aid applied.  Patient stable during and after injection. Patient aware of lab appointment next visit. VSS with discharge and left in satisfactory condition with no s/s of distress noted.  ?

## 2021-07-11 NOTE — Patient Instructions (Signed)
[facility]  Discharge Instructions: °Thank you for choosing [facility] to provide your oncology and hematology care.  °If you have a lab appointment with the Cancer Center, please come in thru the Main Entrance and check in at the main information desk. ° °Wear comfortable clothing and clothing appropriate for easy access to any Portacath or PICC line.  ° °We strive to give you quality time with your provider. You may need to reschedule your appointment if you arrive late (15 or more minutes).  Arriving late affects you and other patients whose appointments are after yours.  Also, if you miss three or more appointments without notifying the office, you may be dismissed from the clinic at the provider’s discretion.    °  °For prescription refill requests, have your pharmacy contact our office and allow 72 hours for refills to be completed.   ° °Today you received the following B12 injection.     °  °To help prevent nausea and vomiting after your treatment, we encourage you to take your nausea medication as directed. ° °BELOW ARE SYMPTOMS THAT SHOULD BE REPORTED IMMEDIATELY: °*FEVER GREATER THAN 100.4 F (38 °C) OR HIGHER °*CHILLS OR SWEATING °*NAUSEA AND VOMITING THAT IS NOT CONTROLLED WITH YOUR NAUSEA MEDICATION °*UNUSUAL SHORTNESS OF BREATH °*UNUSUAL BRUISING OR BLEEDING °*URINARY PROBLEMS (pain or burning when urinating, or frequent urination) °*BOWEL PROBLEMS (unusual diarrhea, constipation, pain near the anus) °TENDERNESS IN MOUTH AND THROAT WITH OR WITHOUT PRESENCE OF ULCERS (sore throat, sores in mouth, or a toothache) °UNUSUAL RASH, SWELLING OR PAIN  °UNUSUAL VAGINAL DISCHARGE OR ITCHING  ° °Items with * indicate a potential emergency and should be followed up as soon as possible or go to the Emergency Department if any problems should occur. ° °Please show the CHEMOTHERAPY ALERT CARD or IMMUNOTHERAPY ALERT CARD at check-in to the Emergency Department and triage nurse. ° °Should  you have questions after your visit or need to cancel or reschedule your appointment, please contact [facility] [phone]  and follow the prompts.  Office hours are 8:00 a.m. to 4:30 p.m. Monday - Friday. Please note that voicemails left after 4:00 p.m. may not be returned until the following business day.  We are closed weekends and major holidays. You have access to a nurse at all times for urgent questions. Please call the main number to the clinic [phone] and follow the prompts. ° °For any non-urgent questions, you may also contact your provider using MyChart. We now offer e-Visits for anyone 18 and older to request care online for non-urgent symptoms. For details visit [email or web address]. °  °Also download the MyChart app! Go to the app store, search "MyChart", open the app, select [facility], and log in with your MyChart username and password. ° °Due to Covid, a mask is required upon entering the hospital/clinic. If you do not have a mask, one will be given to you upon arrival. For doctor visits, patients may have 1 support person aged 18 or older with them. For treatment visits, patients cannot have anyone with them due to current Covid guidelines and our immunocompromised population.  °

## 2021-08-01 ENCOUNTER — Ambulatory Visit (INDEPENDENT_AMBULATORY_CARE_PROVIDER_SITE_OTHER): Payer: Medicare Other | Admitting: *Deleted

## 2021-08-01 DIAGNOSIS — Z5181 Encounter for therapeutic drug level monitoring: Secondary | ICD-10-CM | POA: Diagnosis not present

## 2021-08-01 DIAGNOSIS — I4891 Unspecified atrial fibrillation: Secondary | ICD-10-CM

## 2021-08-01 LAB — POCT INR: INR: 2.4 (ref 2.0–3.0)

## 2021-08-01 NOTE — Patient Instructions (Signed)
Continue warfarin 1/2 tablet daily except 1 tablet on Mondays and Fridays  Recheck in 6 weeks.   

## 2021-08-12 ENCOUNTER — Inpatient Hospital Stay (HOSPITAL_COMMUNITY): Payer: Medicare Other | Attending: Hematology

## 2021-08-12 ENCOUNTER — Inpatient Hospital Stay (HOSPITAL_COMMUNITY): Payer: Medicare Other

## 2021-08-12 VITALS — BP 116/60 | HR 110 | Temp 98.3°F | Resp 18 | Wt 143.2 lb

## 2021-08-12 DIAGNOSIS — Z79899 Other long term (current) drug therapy: Secondary | ICD-10-CM

## 2021-08-12 DIAGNOSIS — C50911 Malignant neoplasm of unspecified site of right female breast: Secondary | ICD-10-CM

## 2021-08-12 DIAGNOSIS — M8580 Other specified disorders of bone density and structure, unspecified site: Secondary | ICD-10-CM

## 2021-08-12 DIAGNOSIS — E538 Deficiency of other specified B group vitamins: Secondary | ICD-10-CM | POA: Diagnosis not present

## 2021-08-12 DIAGNOSIS — Z79811 Long term (current) use of aromatase inhibitors: Secondary | ICD-10-CM

## 2021-08-12 LAB — COMPREHENSIVE METABOLIC PANEL
ALT: 19 U/L (ref 0–44)
AST: 24 U/L (ref 15–41)
Albumin: 4 g/dL (ref 3.5–5.0)
Alkaline Phosphatase: 49 U/L (ref 38–126)
Anion gap: 9 (ref 5–15)
BUN: 17 mg/dL (ref 8–23)
CO2: 24 mmol/L (ref 22–32)
Calcium: 9.9 mg/dL (ref 8.9–10.3)
Chloride: 106 mmol/L (ref 98–111)
Creatinine, Ser: 1.13 mg/dL — ABNORMAL HIGH (ref 0.44–1.00)
GFR, Estimated: 49 mL/min — ABNORMAL LOW (ref >60)
Glucose, Bld: 107 mg/dL — ABNORMAL HIGH (ref 70–99)
Potassium: 4.1 mmol/L (ref 3.5–5.1)
Sodium: 139 mmol/L (ref 135–145)
Total Bilirubin: 0.7 mg/dL (ref 0.3–1.2)
Total Protein: 7.1 g/dL (ref 6.5–8.1)

## 2021-08-12 MED ORDER — DENOSUMAB 60 MG/ML ~~LOC~~ SOSY
60.0000 mg | PREFILLED_SYRINGE | Freq: Once | SUBCUTANEOUS | Status: AC
  Administered 2021-08-12: 60 mg via SUBCUTANEOUS
  Filled 2021-08-12: qty 1

## 2021-08-12 MED ORDER — CYANOCOBALAMIN 1000 MCG/ML IJ SOLN
1000.0000 ug | Freq: Once | INTRAMUSCULAR | Status: AC
  Administered 2021-08-12: 1000 ug via INTRAMUSCULAR
  Filled 2021-08-12: qty 1

## 2021-08-12 NOTE — Progress Notes (Signed)
Tolerated injections with no complaints.  Patient taking calcium as directed.  Denied tooth, jaw, and leg pain.  No recent or upcoming dental visits.  Labs reviewed.  Patient tolerated injection with no complaints voiced.  See MAR for details.  Patient stable during and after injection.  Site clean and dry with no bruising or swelling noted.  Band aid applied.  Vss with discharge and left in satisfactory condition with no s/s of distress noted.   ? ? ?

## 2021-08-12 NOTE — Patient Instructions (Signed)
[facility]  Discharge Instructions: Thank you for choosing [facility] to provide your oncology and hematology care.  If you have a lab appointment with the Cancer Center, please come in thru the Main Entrance and check in at the main information desk.  Wear comfortable clothing and clothing appropriate for easy access to any Portacath or PICC line.   We strive to give you quality time with your provider. You may need to reschedule your appointment if you arrive late (15 or more minutes).  Arriving late affects you and other patients whose appointments are after yours.  Also, if you miss three or more appointments without notifying the office, you may be dismissed from the clinic at the provider's discretion.      For prescription refill requests, have your pharmacy contact our office and allow 72 hours for refills to be completed.        To help prevent nausea and vomiting after your treatment, we encourage you to take your nausea medication as directed.  BELOW ARE SYMPTOMS THAT SHOULD BE REPORTED IMMEDIATELY: *FEVER GREATER THAN 100.4 F (38 C) OR HIGHER *CHILLS OR SWEATING *NAUSEA AND VOMITING THAT IS NOT CONTROLLED WITH YOUR NAUSEA MEDICATION *UNUSUAL SHORTNESS OF BREATH *UNUSUAL BRUISING OR BLEEDING *URINARY PROBLEMS (pain or burning when urinating, or frequent urination) *BOWEL PROBLEMS (unusual diarrhea, constipation, pain near the anus) TENDERNESS IN MOUTH AND THROAT WITH OR WITHOUT PRESENCE OF ULCERS (sore throat, sores in mouth, or a toothache) UNUSUAL RASH, SWELLING OR PAIN  UNUSUAL VAGINAL DISCHARGE OR ITCHING   Items with * indicate a potential emergency and should be followed up as soon as possible or go to the Emergency Department if any problems should occur.  Please show the CHEMOTHERAPY ALERT CARD or IMMUNOTHERAPY ALERT CARD at check-in to the Emergency Department and triage nurse.  Should you have questions after your visit or need to cancel  or reschedule your appointment, please contact  CANC[facility]ENTER [phone]  and follow the prompts.  Office hours are 8:00 a.m. to 4:30 p.m. Monday - Friday. Please note that voicemails left after 4:00 p.m. may not be returned until the following business day.  We are closed weekends and major holidays. You have access to a nurse at all times for urgent questions. Please call the main number to the clinic [phone] and follow the prompts.  For any non-urgent questions, you may also contact your provider using MyChart. We now offer e-Visits for anyone 18 and older to request care online for non-urgent symptoms. For details visit [email or web address].   Also download the MyChart app! Go to the app store, search "MyChart", open the app, select [facility], and log in with your MyChart username and password.  Due to Covid, a mask is required upon entering the hospital/clinic. If you do not have a mask, one will be given to you upon arrival. For doctor visits, patients may have 1 support person aged 18 or older with them. For treatment visits, patients cannot have anyone with them due to current Covid guidelines and our immunocompromised population.  

## 2021-09-11 ENCOUNTER — Encounter (HOSPITAL_COMMUNITY): Payer: Self-pay

## 2021-09-11 ENCOUNTER — Inpatient Hospital Stay (HOSPITAL_COMMUNITY): Payer: Medicare Other | Attending: Hematology

## 2021-09-11 VITALS — BP 115/52 | HR 75 | Temp 98.4°F | Resp 18

## 2021-09-11 DIAGNOSIS — Z79899 Other long term (current) drug therapy: Secondary | ICD-10-CM

## 2021-09-11 DIAGNOSIS — C50911 Malignant neoplasm of unspecified site of right female breast: Secondary | ICD-10-CM

## 2021-09-11 DIAGNOSIS — E538 Deficiency of other specified B group vitamins: Secondary | ICD-10-CM | POA: Diagnosis present

## 2021-09-11 DIAGNOSIS — M8580 Other specified disorders of bone density and structure, unspecified site: Secondary | ICD-10-CM

## 2021-09-11 MED ORDER — CYANOCOBALAMIN 1000 MCG/ML IJ SOLN
1000.0000 ug | Freq: Once | INTRAMUSCULAR | Status: AC
  Administered 2021-09-11: 1000 ug via INTRAMUSCULAR
  Filled 2021-09-11: qty 1

## 2021-09-11 NOTE — Patient Instructions (Signed)
[facility]  Discharge Instructions: ?Thank you for choosing [facility] to provide your oncology and hematology care.  ?If you have a lab appointment with the [facility], please come in thru the Main Entrance and check in at the main information desk. ? ?Wear comfortable clothing and clothing appropriate for easy access to any Portacath or PICC line.  ? ?We strive to give you quality time with your provider. You may need to reschedule your appointment if you arrive late (15 or more minutes).  Arriving late affects you and other patients whose appointments are after yours.  Also, if you miss three or more appointments without notifying the office, you may be dismissed from the clinic at the provider?s discretion.    ?  ?For prescription refill requests, have your pharmacy contact our office and allow 72 hours for refills to be completed.   ? ?Today you received the following Vitamin B12 injection.  ?Vitamin B12 Injection ?What is this medication? ?Vitamin B12 (VAHY tuh min B12) prevents and treats low vitamin B12 levels in your body. It is used in people who do not get enough vitamin B12 from their diet or when their digestive tract does not absorb enough. Vitamin B12 plays an important role in maintaining the health of your nervous system and red blood cells. ?This medicine may be used for other purposes; ask your health care provider or pharmacist if you have questions. ?COMMON BRAND NAME(S): B-12 Compliance Kit, B-12 Injection Kit, Cyomin, Dodex, LA-12, Nutri-Twelve, Physicians EZ Use B-12, Primabalt ?What should I tell my care team before I take this medication? ?They need to know if you have any of these conditions: ?Kidney disease ?Leber's disease ?Megaloblastic anemia ?An unusual or allergic reaction to cyanocobalamin, cobalt, other medications, foods, dyes, or preservatives ?Pregnant or trying to get pregnant ?Breast-feeding ?How should I use this medication? ?This medication is  injected into a muscle or deeply under the skin. It is usually given in a clinic or care team's office. However, your care team may teach you how to inject yourself. Follow all instructions. ?Talk to your care team about the use of this medication in children. Special care may be needed. ?Overdosage: If you think you have taken too much of this medicine contact a poison control center or emergency room at once. ?NOTE: This medicine is only for you. Do not share this medicine with others. ?What if I miss a dose? ?If you are given your dose at a clinic or care team's office, call to reschedule your appointment. If you give your own injections, and you miss a dose, take it as soon as you can. If it is almost time for your next dose, take only that dose. Do not take double or extra doses. ?What may interact with this medication? ?Alcohol ?Colchicine ?This list may not describe all possible interactions. Give your health care provider a list of all the medicines, herbs, non-prescription drugs, or dietary supplements you use. Also tell them if you smoke, drink alcohol, or use illegal drugs. Some items may interact with your medicine. ?What should I watch for while using this medication? ?Visit your care team regularly. You may need blood work done while you are taking this medication. ?You may need to follow a special diet. Talk to your care team. Limit your alcohol intake and avoid smoking to get the best benefit. ?What side effects may I notice from receiving this medication? ?Side effects that you should report to your care  team as soon as possible: ?Allergic reactions--skin rash, itching, hives, swelling of the face, lips, tongue, or throat ?Swelling of the ankles, hands, or feet ?Trouble breathing ?Side effects that usually do not require medical attention (report to your care team if they continue or are bothersome): ?Diarrhea ?This list may not describe all possible side effects. Call your doctor for medical advice  about side effects. You may report side effects to FDA at 1-800-FDA-1088. ?Where should I keep my medication? ?Keep out of the reach of children. ?Store at room temperature between 15 and 30 degrees C (59 and 85 degrees F). Protect from light. Throw away any unused medication after the expiration date. ?NOTE: This sheet is a summary. It may not cover all possible information. If you have questions about this medicine, talk to your doctor, pharmacist, or health care provider. ?? 2023 Elsevier/Gold Standard (2020-12-25 00:00:00) ? ?  ?To help prevent nausea and vomiting after your treatment, we encourage you to take your nausea medication as directed. ? ?BELOW ARE SYMPTOMS THAT SHOULD BE REPORTED IMMEDIATELY: ?*FEVER GREATER THAN 100.4 F (38 ?C) OR HIGHER ?*CHILLS OR SWEATING ?*NAUSEA AND VOMITING THAT IS NOT CONTROLLED WITH YOUR NAUSEA MEDICATION ?*UNUSUAL SHORTNESS OF BREATH ?*UNUSUAL BRUISING OR BLEEDING ?*URINARY PROBLEMS (pain or burning when urinating, or frequent urination) ?*BOWEL PROBLEMS (unusual diarrhea, constipation, pain near the anus) ?TENDERNESS IN MOUTH AND THROAT WITH OR WITHOUT PRESENCE OF ULCERS (sore throat, sores in mouth, or a toothache) ?UNUSUAL RASH, SWELLING OR PAIN  ?UNUSUAL VAGINAL DISCHARGE OR ITCHING  ? ?Items with * indicate a potential emergency and should be followed up as soon as possible or go to the Emergency Department if any problems should occur. ? ?Please show the CHEMOTHERAPY ALERT CARD or IMMUNOTHERAPY ALERT CARD at check-in to the Emergency Department and triage nurse. ? ?Should you have questions after your visit or need to cancel or reschedule your appointment, please contact [facility] [phone]  and follow the prompts.  Office hours are 8:00 a.m. to 4:30 p.m. Monday - Friday. Please note that voicemails left after 4:00 p.m. may not be returned until the following business day.  We are closed weekends and major holidays. You have access to a nurse at all  times for urgent questions. Please call the main number to the clinic [phone] and follow the prompts. ? ?For any non-urgent questions, you may also contact your provider using MyChart. We now offer e-Visits for anyone 48 and older to request care online for non-urgent symptoms. For details visit [email or web address]. ?  ?Also download the MyChart app! Go to the app store, search "MyChart", open the app, select [facility], and log in with your MyChart username and password. ? ?Due to Covid, a mask is required upon entering the hospital/clinic. If you do not have a mask, one will be given to you upon arrival. For doctor visits, patients may have 1 support person aged 47 or older with them. For treatment visits, patients cannot have anyone with them due to current Covid guidelines and our immunocompromised population.  ?

## 2021-09-11 NOTE — Progress Notes (Signed)
Patient tolerated injection with no complaints voiced.  Site clean and dry with no bruising or swelling noted at site.  See MAR for details.  Band aid applied.  Patient stable during and after injection.  Vss with discharge and left in satisfactory condition with no s/s of distress noted.  

## 2021-09-12 ENCOUNTER — Ambulatory Visit (INDEPENDENT_AMBULATORY_CARE_PROVIDER_SITE_OTHER): Payer: Medicare Other | Admitting: *Deleted

## 2021-09-12 DIAGNOSIS — Z5181 Encounter for therapeutic drug level monitoring: Secondary | ICD-10-CM | POA: Diagnosis not present

## 2021-09-12 DIAGNOSIS — I4891 Unspecified atrial fibrillation: Secondary | ICD-10-CM | POA: Diagnosis not present

## 2021-09-12 DIAGNOSIS — I480 Paroxysmal atrial fibrillation: Secondary | ICD-10-CM

## 2021-09-12 LAB — POCT INR: INR: 2.6 (ref 2.0–3.0)

## 2021-09-12 MED ORDER — WARFARIN SODIUM 2.5 MG PO TABS: ORAL_TABLET | ORAL | 5 refills | Status: DC

## 2021-09-12 NOTE — Patient Instructions (Signed)
Continue warfarin 1/2 tablet daily except 1 tablet on Mondays and Fridays  Recheck in 6 weeks.   

## 2021-10-11 ENCOUNTER — Other Ambulatory Visit: Payer: Self-pay | Admitting: Cardiology

## 2021-10-14 ENCOUNTER — Encounter (HOSPITAL_COMMUNITY): Payer: Self-pay

## 2021-10-14 ENCOUNTER — Inpatient Hospital Stay (HOSPITAL_COMMUNITY): Payer: Medicare Other | Attending: Hematology

## 2021-10-14 VITALS — BP 112/57 | HR 90 | Temp 97.1°F | Resp 18

## 2021-10-14 DIAGNOSIS — E538 Deficiency of other specified B group vitamins: Secondary | ICD-10-CM | POA: Diagnosis present

## 2021-10-14 DIAGNOSIS — M8580 Other specified disorders of bone density and structure, unspecified site: Secondary | ICD-10-CM

## 2021-10-14 DIAGNOSIS — C50911 Malignant neoplasm of unspecified site of right female breast: Secondary | ICD-10-CM

## 2021-10-14 DIAGNOSIS — Z79899 Other long term (current) drug therapy: Secondary | ICD-10-CM

## 2021-10-14 MED ORDER — CYANOCOBALAMIN 1000 MCG/ML IJ SOLN
1000.0000 ug | Freq: Once | INTRAMUSCULAR | Status: AC
  Administered 2021-10-14: 1000 ug via INTRAMUSCULAR
  Filled 2021-10-14: qty 1

## 2021-10-14 NOTE — Progress Notes (Signed)
Patient tolerated injection with no complaints voiced.  Site clean and dry with no bruising or swelling noted at site.  See MAR for details.  Band aid applied.  Patient stable during and after injection.  Vss with discharge and left in satisfactory condition with no s/s of distress noted.  

## 2021-10-14 NOTE — Patient Instructions (Signed)
Stacie Huang  Discharge Instructions: Thank you for choosing [facility] to provide your oncology and hematology care.  If you have a lab appointment with the [facility], please come in thru the Main Entrance and check in at the main information desk.  Wear comfortable clothing and clothing appropriate for easy access to any Portacath or PICC line.   We strive to give you quality time with your provider. You may need to reschedule your appointment if you arrive late (15 or more minutes).  Arriving late affects you and other patients whose appointments are after yours.  Also, if you miss three or more appointments without notifying the office, you may be dismissed from the clinic at the provider's discretion.      For prescription refill requests, have your pharmacy contact our office and allow 72 hours for refills to be completed.    Today you received the following chemotherapy and/or immunotherapy agents Vitamin B12 Vitamin B12 Deficiency Vitamin B12 deficiency occurs when the body does not have enough of this important vitamin. The body needs this vitamin: To make red blood cells. To make DNA. This is the genetic material inside cells. To help the nerves work properly so they can carry messages from the brain to the body. Vitamin B12 deficiency can cause health problems, such as not having enough red blood cells in the blood (anemia). This can lead to nerve damage if untreated. What are the causes? This condition may be caused by: Not eating enough foods that contain vitamin B12. Not having enough stomach acid and digestive fluids to properly absorb vitamin B12 from the food that you eat. Having certain diseases that make it hard to absorb vitamin B12. These diseases include Crohn's disease, chronic pancreatitis, and cystic fibrosis. An autoimmune disorder in which the body does not make enough of a protein (intrinsic factor) within the stomach, resulting in not  enough absorption of vitamin B12. Having a surgery in which part of the stomach or small intestine is removed. Taking certain medicines that make it hard for the body to absorb vitamin B12. These include: Heartburn medicines, such as antacids and proton pump inhibitors. Some medicines that are used to treat diabetes. What increases the risk? The following factors may make you more likely to develop a vitamin B12 deficiency: Being an older adult. Eating a vegetarian or vegan diet that does not include any foods that come from animals. Eating a poor diet while you are pregnant. Taking certain medicines. Having alcoholism. What are the signs or symptoms? In some cases, there are no symptoms of this condition. If the condition leads to anemia or nerve damage, various symptoms may occur, such as: Weakness. Tiredness (fatigue). Loss of appetite. Numbness or tingling in your hands and feet. Redness and burning of the tongue. Depression, confusion, or memory problems. Trouble walking. If anemia is severe, symptoms can include: Shortness of breath. Dizziness. Rapid heart rate. How is this diagnosed? This condition may be diagnosed with a blood test to measure the level of vitamin B12 in your blood. You may also have other tests, including: A group of tests that measure certain characteristics of blood cells (complete blood count, CBC). A blood test to measure intrinsic factor. A procedure where a thin tube with a camera on the end is used to look into your stomach or intestines (endoscopy). Other tests may be needed to discover the cause of the deficiency. How is this treated? Treatment for this condition depends on  the cause. This condition may be treated by: Changing your eating and drinking habits, such as: Eating more foods that contain vitamin B12. Drinking less alcohol or no alcohol. Getting vitamin B12 injections. Taking vitamin B12 supplements by mouth (orally). Your health care  provider will tell you which dose is best for you. Follow these instructions at home: Eating and drinking  Include foods in your diet that come from animals and contain a lot of vitamin B12. These include: Meats and poultry. This includes beef, pork, chicken, [place], and organ meats, such as liver. Seafood. This includes clams, rainbow trout, salmon, tuna, and haddock. Eggs. Dairy foods such as milk, yogurt, and cheese. Eat foods that have vitamin B12 added to them (are fortified), such as ready-to-eat breakfast cereals. Check the label on the package to see if a food is fortified. The items listed above may not be a complete list of foods and beverages you can eat and drink. Contact a dietitian for more information. Alcohol use Do not drink alcohol if: Your health care provider tells you not to drink. You are pregnant, may be pregnant, or are planning to become pregnant. If you drink alcohol: Limit how much you have to: 0-1 drink a day for women. 0-2 drinks a day for men. Know how much alcohol is in your drink. In the [place] one drink equals one 12 oz bottle of beer (355 mL), one 5 oz glass of wine (148 mL), or one 1 oz glass of hard liquor (44 mL). General instructions Get vitamin B12 injections if told to by your health care provider. Take supplements only as told by your health care provider. Follow the directions carefully. Keep all follow-up visits. This is important. Contact a health care provider if: Your symptoms come back. Your symptoms get worse or do not improve with treatment. Get help right away: You develop shortness of breath. You have a rapid heart rate. You have chest pain. You become dizzy or you faint. These symptoms may be an emergency. Get help right away. Call 911. Do not wait to see if the symptoms will go away. Do not drive yourself to the hospital. Summary Vitamin B12 deficiency occurs when the body does not have enough of this important vitamin. Common  causes include not eating enough foods that contain vitamin B12, not being able to absorb vitamin B12 from the food that you eat, having a surgery in which part of the stomach or small intestine is removed, or taking certain medicines. Eat foods that have vitamin B12 in them. Treatment may include making a change in the way you eat and drink, getting vitamin B12 injections, or taking vitamin B12 supplements. This information is not intended to replace advice given to you by your health care provider. Make sure you discuss any questions you have with your health care provider. Document Revised: 12/07/2020 Document Reviewed: 12/07/2020 Elsevier Patient Education  [place].       To help prevent nausea and vomiting after your treatment, we encourage you to take your nausea medication as directed.  BELOW ARE SYMPTOMS THAT SHOULD BE REPORTED IMMEDIATELY: *FEVER GREATER THAN 100.4 F (38 C) OR HIGHER *CHILLS OR SWEATING *NAUSEA AND VOMITING THAT IS NOT CONTROLLED WITH YOUR NAUSEA MEDICATION *UNUSUAL SHORTNESS OF BREATH *UNUSUAL BRUISING OR BLEEDING *URINARY PROBLEMS (pain or burning when urinating, or frequent urination) *BOWEL PROBLEMS (unusual diarrhea, constipation, pain near the anus) TENDERNESS IN MOUTH AND THROAT WITH OR WITHOUT PRESENCE OF ULCERS (sore throat, sores in mouth,  or a toothache) UNUSUAL RASH, SWELLING OR PAIN  UNUSUAL VAGINAL DISCHARGE OR ITCHING   Items with * indicate a potential emergency and should be followed up as soon as possible or go to the Emergency Department if any problems should occur.  Please show the CHEMOTHERAPY ALERT CARD or IMMUNOTHERAPY ALERT CARD at check-in to the Emergency Department and triage nurse.  Should you have questions after your visit or need to cancel or reschedule your appointment, please contact [facility] [phone]  and follow the prompts.  Office hours are 8:00 a.m. to 4:30 p.m. Monday - Friday. Please note that  voicemails left after 4:00 p.m. may not be returned until the following business day.  We are closed weekends and major holidays. You have access to a nurse at all times for urgent questions. Please call the main number to the clinic [phone] and follow the prompts.  For any non-urgent questions, you may also contact your provider using MyChart. We now offer e-Visits for anyone 56 and older to request care online for non-urgent symptoms. For details visit [email or web address].   Also download the MyChart app! Go to the app store, search "MyChart", open the app, select [facility], and log in with your MyChart username and password.  Masks are optional in the cancer centers. If you would like for your care team to wear a mask while they are taking care of you, please let them know. For doctor visits, patients may have with them one support person who is at least 82 years old. At this time, visitors are not allowed in the infusion area.

## 2021-10-24 ENCOUNTER — Ambulatory Visit (INDEPENDENT_AMBULATORY_CARE_PROVIDER_SITE_OTHER): Payer: Medicare Other | Admitting: *Deleted

## 2021-10-24 DIAGNOSIS — I4891 Unspecified atrial fibrillation: Secondary | ICD-10-CM

## 2021-10-24 DIAGNOSIS — Z5181 Encounter for therapeutic drug level monitoring: Secondary | ICD-10-CM | POA: Diagnosis not present

## 2021-10-24 LAB — POCT INR: INR: 2.9 (ref 2.0–3.0)

## 2021-10-24 NOTE — Patient Instructions (Signed)
Continue warfarin 1/2 tablet daily except 1 tablet on Mondays and Fridays  Recheck in 6 weeks.

## 2021-11-01 ENCOUNTER — Other Ambulatory Visit (HOSPITAL_COMMUNITY): Payer: Self-pay | Admitting: Hematology

## 2021-11-01 DIAGNOSIS — C50911 Malignant neoplasm of unspecified site of right female breast: Secondary | ICD-10-CM

## 2021-11-06 ENCOUNTER — Ambulatory Visit: Payer: Medicare Other | Admitting: Cardiology

## 2021-11-06 NOTE — Progress Notes (Deleted)
Clinical Summary Ms. Samet is a 81 y.o.female  seen today for follow up of the following medical problem.s    1. Afib  - not interested in NOACs, remains on coumadin.      - no recent palpitations - compliant with meds - no bleeding on coumadin   2. HTN - compliant with meds   3. Hyperlipidemia - labs followed by pcp, she is on statin   4. 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 90 tablet 1   atenolol (TENORMIN) 100 MG tablet Take 1 tablet (100 mg total) by mouth daily. 90 tablet 3   calcium carbonate (OS-CAL) 600 MG TABS tablet Take 600 mg by mouth 2 (two) times daily with a meal.      cetirizine (ZYRTEC) 10 MG tablet Take 10 mg by mouth every morning.     Cholecalciferol (VITAMIN D) 400 UNITS capsule Take 400 Units by mouth 2 (two) times daily.     Cyanocobalamin 2000 MCG/ML SOLN Inject 1 mL as directed. Patient injects once a month.  Is not sure of dose.     denosumab (PROLIA) 60 MG/ML SOLN injection Inject 60 mg into the skin every 6 (six) months. Administer in upper arm, thigh, or abdomen     diltiazem (CARDIZEM CD) 120 MG 24 hr capsule TAKE (1) CAPSULE BY MOUTH ONCE A DAY. 90 capsule 1   fluticasone (FLONASE) 50 MCG/ACT nasal spray Place 2 sprays into both nostrils at bedtime.     LORazepam (ATIVAN) 1 MG tablet Take 1 mg by mouth at bedtime.     metoCLOPramide (REGLAN) 10 MG  tablet TAKE 1 TABLET BY MOUTH EVERY 8 HOURS AS NEEDED FOR NAUSEA. 120 tablet 2   pantoprazole (PROTONIX) 40 MG tablet TAKE (1) TABLET BY MOUTH TWICE DAILY. 60 tablet 5   psyllium (METAMUCIL) 58.6 % powder Take 1 packet by mouth daily.      simvastatin (ZOCOR) 10 MG tablet Take 10 mg by mouth at bedtime.     warfarin (COUMADIN) 2.5 MG tablet TAKE 1/2 TABLET BY MOUTH DAILY EXCEPT 1 TABLET ON MONDAY AND FRIDAYS. 30 tablet 5   No current facility-administered medications for this visit.     Past Surgical History:  Procedure Laterality Date   ABDOMINAL HYSTERECTOMY     AXILLARY LYMPH NODE DISSECTION Left 02/23/2013   Procedure: LEFT AXILLARY LYMPH NODE DISSECTION;  Surgeon: Scherry Ran, MD;  Location: [facility];  Service: General;  Laterality: Left;   CHOLECYSTECTOMY     COLONOSCOPY  03/10/2002   RMR: Incomplete colonoscopy (sigmoidoscopy)/Normal rectum/Normal colon to 40 cm.    ESOPHAGOGASTRODUODENOSCOPY  03/10/2002   HEN:IDPOEU esophagus/small HH/Tiny AVM in antrum, otherwise normal stomach and D1 and D2/Status post passage of the Maloney dilator   PARTIAL MASTECTOMY WITH AXILLARY SENTINEL LYMPH NODE BIOPSY Left  02/23/2013   Procedure: LEFT PARTIAL MASTECTOMY WITH AXILLARY SIMPLE NODE BIOPSY, LEFT BREAST WIDE EXCISION;  Surgeon: Scherry Ran, MD;  Location: [facility];  Service: General;  Laterality: Left;   PARTIAL MASTECTOMY WITH NEEDLE LOCALIZATION Right 04/02/2016   Procedure: RIGHT PARTIAL MASTECTOMY AFTER NEEDLE LOCALIZATION;  Surgeon: Aviva Signs, MD;  Location: [facility];  Service: General;  Laterality: Right;   PORT-A-CATH REMOVAL Right 07/31/2017   Procedure: MINOR REMOVAL PORT-A-CATH;  Surgeon: Aviva Signs, MD;  Location: [facility];  Service: General;  Laterality: Right;   PORTACATH PLACEMENT Right 04/07/2013   PORTACATH PLACEMENT Right 04/07/2013   Procedure: INSERTION PORT-A-CATH;  Surgeon: Scherry Ran, MD;  Location: [facility];  Service: General;  Laterality: Right;      Allergies  Allergen Reactions   Iohexol Hives   Sulfa Antibiotics Rash   Sulfasalazine Rash      Family History  Problem Relation Age of Onset   Heart disease Mother        Also 37 of 9 siblings   Heart attack Father    Leukemia Other    Leukemia Other    Colon cancer Neg Hx      Social History Ms. Hogrefe reports that she has never smoked. She has never used smokeless tobacco. Ms. Yau reports no history of alcohol use.   Review of Systems CONSTITUTIONAL: No weight loss, fever, chills, weakness or fatigue.  HEENT: Eyes: No visual loss, blurred vision, double vision or yellow sclerae.No hearing loss, sneezing, congestion, runny nose or sore throat.  SKIN: No rash or itching.  CARDIOVASCULAR:  RESPIRATORY: No shortness of breath, cough or sputum.  GASTROINTESTINAL: No anorexia, nausea, vomiting or diarrhea. No abdominal pain or blood.  GENITOURINARY: No burning on urination, no polyuria NEUROLOGICAL: No headache, dizziness, syncope, paralysis, ataxia, numbness or tingling in the extremities. No change in bowel or bladder control.  MUSCULOSKELETAL: No muscle, back pain, joint pain or stiffness.  LYMPHATICS: No enlarged nodes. No history of splenectomy.  PSYCHIATRIC: No history of depression or anxiety.  ENDOCRINOLOGIC: No reports of sweating, cold or heat intolerance. No polyuria or polydipsia.  Marland Kitchen   Physical Examination There were no vitals filed for this visit. There were no vitals filed for this visit.  Gen: resting comfortably, no acute distress HEENT: no scleral icterus, pupils equal round and reactive, no palptable cervical adenopathy,  CV Resp: Clear to auscultation bilaterally GI: abdomen is soft, non-tender, non-distended, normal bowel sounds, no hepatosplenomegaly MSK: extremities are warm, no edema.  Skin: warm, no rash Neuro:  no focal deficits Psych: appropriate affect   Diagnostic Studies     Assessment and Plan  1. Afib - no symptmos -  EKG today shows rate controlled afib - has not been interested in NOACs, continue coumadin   2. HTN - at goal, continue current meds   3. Hyperlipidemia - request pcp labs, continue statin      Arnoldo Lenis, M.D., F.A.C.C.

## 2021-11-13 ENCOUNTER — Ambulatory Visit (HOSPITAL_COMMUNITY): Payer: Medicare Other

## 2021-11-15 ENCOUNTER — Inpatient Hospital Stay (HOSPITAL_COMMUNITY): Payer: Medicare Other | Attending: Hematology

## 2021-11-15 VITALS — BP 105/76 | HR 86 | Temp 98.9°F | Resp 18

## 2021-11-15 DIAGNOSIS — E538 Deficiency of other specified B group vitamins: Secondary | ICD-10-CM | POA: Diagnosis present

## 2021-11-15 DIAGNOSIS — M8580 Other specified disorders of bone density and structure, unspecified site: Secondary | ICD-10-CM

## 2021-11-15 DIAGNOSIS — C50911 Malignant neoplasm of unspecified site of right female breast: Secondary | ICD-10-CM

## 2021-11-15 DIAGNOSIS — Z79899 Other long term (current) drug therapy: Secondary | ICD-10-CM

## 2021-11-15 MED ORDER — CYANOCOBALAMIN 1000 MCG/ML IJ SOLN
1000.0000 ug | Freq: Once | INTRAMUSCULAR | Status: AC
  Administered 2021-11-15: 1000 ug via INTRAMUSCULAR
  Filled 2021-11-15: qty 1

## 2021-11-15 NOTE — Patient Instructions (Signed)
[facility]  Discharge Instructions: Thank you for choosing [facility] to provide your oncology and hematology care.  If you have a lab appointment with the [facility], please come in thru the Main Entrance and check in at the main information desk.  Wear comfortable clothing and clothing appropriate for easy access to any Portacath or PICC line.   We strive to give you quality time with your provider. You may need to reschedule your appointment if you arrive late (15 or more minutes).  Arriving late affects you and other patients whose appointments are after yours.  Also, if you miss three or more appointments without notifying the office, you may be dismissed from the clinic at the provider's discretion.      For prescription refill requests, have your pharmacy contact our office and allow 72 hours for refills to be completed.    Today you received B12 injection.     BELOW ARE SYMPTOMS THAT SHOULD BE REPORTED IMMEDIATELY: *FEVER GREATER THAN 100.4 F (38 C) OR HIGHER *CHILLS OR SWEATING *NAUSEA AND VOMITING THAT IS NOT CONTROLLED WITH YOUR NAUSEA MEDICATION *UNUSUAL SHORTNESS OF BREATH *UNUSUAL BRUISING OR BLEEDING *URINARY PROBLEMS (pain or burning when urinating, or frequent urination) *BOWEL PROBLEMS (unusual diarrhea, constipation, pain near the anus) TENDERNESS IN MOUTH AND THROAT WITH OR WITHOUT PRESENCE OF ULCERS (sore throat, sores in mouth, or a toothache) UNUSUAL RASH, SWELLING OR PAIN  UNUSUAL VAGINAL DISCHARGE OR ITCHING   Items with * indicate a potential emergency and should be followed up as soon as possible or go to the Emergency Department if any problems should occur.  Please show the CHEMOTHERAPY ALERT CARD or IMMUNOTHERAPY ALERT CARD at check-in to the Emergency Department and triage nurse.  Should you have questions after your visit or need to cancel or reschedule your appointment, please contact [facility] [phone]   and follow the prompts.  Office hours are 8:00 a.m. to 4:30 p.m. Monday - Friday. Please note that voicemails left after 4:00 p.m. may not be returned until the following business day.  We are closed weekends and major holidays. You have access to a nurse at all times for urgent questions. Please call the main number to the clinic [phone] and follow the prompts.  For any non-urgent questions, you may also contact your provider using MyChart. We now offer e-Visits for anyone 40 and older to request care online for non-urgent symptoms. For details visit [email or web address].   Also download the MyChart app! Go to the app store, search "MyChart", open the app, select [facility], and log in with your MyChart username and password.  Masks are optional in the cancer centers. If you would like for your care team to wear a mask while they are taking care of you, please let them know. For doctor visits, patients may have with them one support person who is at least 82 years old. At this time, visitors are not allowed in the infusion area.

## 2021-11-15 NOTE — Progress Notes (Signed)
Stacie Huang presents today for injection per the provider's orders.  B12 administration without incident; injection site WNL; see MAR for injection details.  Patient tolerated procedure well and without incident.  No questions or complaints noted at this time.   Discharged from clinic ambulatory in stable condition. Alert and oriented x 3. F/U with [facility] as scheduled.

## 2021-12-05 ENCOUNTER — Ambulatory Visit (INDEPENDENT_AMBULATORY_CARE_PROVIDER_SITE_OTHER): Payer: Medicare Other | Admitting: *Deleted

## 2021-12-05 ENCOUNTER — Encounter: Payer: Self-pay | Admitting: Cardiology

## 2021-12-05 DIAGNOSIS — I4891 Unspecified atrial fibrillation: Secondary | ICD-10-CM

## 2021-12-05 DIAGNOSIS — Z5181 Encounter for therapeutic drug level monitoring: Secondary | ICD-10-CM | POA: Diagnosis not present

## 2021-12-05 LAB — POCT INR: INR: 3 (ref 2.0–3.0)

## 2021-12-05 NOTE — Telephone Encounter (Signed)
Error

## 2021-12-05 NOTE — Patient Instructions (Signed)
Continue warfarin 1/2 tablet daily except 1 tablet on Mondays and Fridays  Recheck in 6 weeks.

## 2021-12-16 ENCOUNTER — Inpatient Hospital Stay: Payer: Medicare Other

## 2021-12-17 ENCOUNTER — Inpatient Hospital Stay: Payer: Medicare Other | Attending: Hematology

## 2021-12-17 VITALS — BP 113/65 | HR 110 | Temp 98.7°F | Resp 18

## 2021-12-17 DIAGNOSIS — Z79899 Other long term (current) drug therapy: Secondary | ICD-10-CM

## 2021-12-17 DIAGNOSIS — C50911 Malignant neoplasm of unspecified site of right female breast: Secondary | ICD-10-CM

## 2021-12-17 DIAGNOSIS — E538 Deficiency of other specified B group vitamins: Secondary | ICD-10-CM | POA: Diagnosis not present

## 2021-12-17 DIAGNOSIS — M8580 Other specified disorders of bone density and structure, unspecified site: Secondary | ICD-10-CM

## 2021-12-17 MED ORDER — CYANOCOBALAMIN 1000 MCG/ML IJ SOLN
1000.0000 ug | Freq: Once | INTRAMUSCULAR | Status: AC
  Administered 2021-12-17: 1000 ug via INTRAMUSCULAR
  Filled 2021-12-17: qty 1

## 2021-12-17 NOTE — Patient Instructions (Signed)
[place]  Discharge Instructions: Thank you for choosing [facility] to provide your oncology and hematology care.  If you have a lab appointment with the [facility], please come in thru the Main Entrance and check in at the main information desk.  Wear comfortable clothing and clothing appropriate for easy access to any Portacath or PICC line.   We strive to give you quality time with your provider. You may need to reschedule your appointment if you arrive late (15 or more minutes).  Arriving late affects you and other patients whose appointments are after yours.  Also, if you miss three or more appointments without notifying the office, you may be dismissed from the clinic at the provider's discretion.      For prescription refill requests, have your pharmacy contact our office and allow 72 hours for refills to be completed.    Today you received the following chemotherapy and/or immunotherapy agents vitamin b12.  Vitamin B12 Injection What is this medication? Vitamin B12 (VAHY tuh min B12) prevents and treats low vitamin B12 levels in your body. It is used in people who do not get enough vitamin B12 from their diet or when their digestive tract does not absorb enough. Vitamin B12 plays an important role in maintaining the health of your nervous system and red blood cells. This medicine may be used for other purposes; ask your health care provider or pharmacist if you have questions. COMMON BRAND NAME(S): B-12 Compliance Kit, B-12 Injection Kit, Cyomin, Dodex, LA-12, Nutri-Twelve, Physicians EZ Use B-12, Primabalt What should I tell my care team before I take this medication? They need to know if you have any of these conditions: Kidney disease Leber's disease Megaloblastic anemia An unusual or allergic reaction to cyanocobalamin, cobalt, other medications, foods, dyes, or preservatives Pregnant or trying to get pregnant Breast-feeding How should I  use this medication? This medication is injected into a muscle or deeply under the skin. It is usually given in a clinic or care team's office. However, your care team may teach you how to inject yourself. Follow all instructions. Talk to your care team about the use of this medication in children. Special care may be needed. Overdosage: If you think you have taken too much of this medicine contact a poison control center or emergency room at once. NOTE: This medicine is only for you. Do not share this medicine with others. What if I miss a dose? If you are given your dose at a clinic or care team's office, call to reschedule your appointment. If you give your own injections, and you miss a dose, take it as soon as you can. If it is almost time for your next dose, take only that dose. Do not take double or extra doses. What may interact with this medication? Alcohol Colchicine This list may not describe all possible interactions. Give your health care provider a list of all the medicines, herbs, non-prescription drugs, or dietary supplements you use. Also tell them if you smoke, drink alcohol, or use illegal drugs. Some items may interact with your medicine. What should I watch for while using this medication? Visit your care team regularly. You may need blood work done while you are taking this medication. You may need to follow a special diet. Talk to your care team. Limit your alcohol intake and avoid smoking to get the best benefit. What side effects may I notice from receiving this medication? Side effects that you should  report to your care team as soon as possible: Allergic reactions--skin rash, itching, hives, swelling of the face, lips, tongue, or throat Swelling of the ankles, hands, or feet Trouble breathing Side effects that usually do not require medical attention (report to your care team if they continue or are bothersome): Diarrhea This list may not describe all possible side  effects. Call your doctor for medical advice about side effects. You may report side effects to FDA at 1-800-FDA-1088. Where should I keep my medication? Keep out of the reach of children. Store at room temperature between 15 and 30 degrees C (59 and 85 degrees F). Protect from light. Throw away any unused medication after the expiration date. NOTE: This sheet is a summary. It may not cover all possible information. If you have questions about this medicine, talk to your doctor, pharmacist, or health care provider.  2023 Elsevier/Gold Standard (2020-06-21 00:00:00)       To help prevent nausea and vomiting after your treatment, we encourage you to take your nausea medication as directed.  BELOW ARE SYMPTOMS THAT SHOULD BE REPORTED IMMEDIATELY: *FEVER GREATER THAN 100.4 F (38 C) OR HIGHER *CHILLS OR SWEATING *NAUSEA AND VOMITING THAT IS NOT CONTROLLED WITH YOUR NAUSEA MEDICATION *UNUSUAL SHORTNESS OF BREATH *UNUSUAL BRUISING OR BLEEDING *URINARY PROBLEMS (pain or burning when urinating, or frequent urination) *BOWEL PROBLEMS (unusual diarrhea, constipation, pain near the anus) TENDERNESS IN MOUTH AND THROAT WITH OR WITHOUT PRESENCE OF ULCERS (sore throat, sores in mouth, or a toothache) UNUSUAL RASH, SWELLING OR PAIN  UNUSUAL VAGINAL DISCHARGE OR ITCHING   Items with * indicate a potential emergency and should be followed up as soon as possible or go to the Emergency Department if any problems should occur.  Please show the CHEMOTHERAPY ALERT CARD or IMMUNOTHERAPY ALERT CARD at check-in to the Emergency Department and triage nurse.  Should you have questions after your visit or need to cancel or reschedule your appointment, please contact [facility] [phone]  and follow the prompts.  Office hours are 8:00 a.m. to 4:30 p.m. Monday - Friday. Please note that voicemails left after 4:00 p.m. may not be returned until the following business day.  We are closed  weekends and major holidays. You have access to a nurse at all times for urgent questions. Please call the main number to the clinic [phone] and follow the prompts.  For any non-urgent questions, you may also contact your provider using MyChart. We now offer e-Visits for anyone 39 and older to request care online for non-urgent symptoms. For details visit [email or web address].   Also download the MyChart app! Go to the app store, search "MyChart", open the app, select [facility], and log in with your MyChart username and password.  Masks are optional in the cancer centers. If you would like for your care team to wear a mask while they are taking care of you, please let them know. You may have one support person who is at least 82 years old accompany you for your appointments.

## 2021-12-17 NOTE — Progress Notes (Signed)
Patient tolerated injection with no complaints voiced.  Site clean and dry with no bruising or swelling noted at site.  See MAR for details.  Band aid applied.  Patient stable during and after injection.  Vss with discharge and left in satisfactory condition with no s/s of distress noted.  

## 2021-12-23 ENCOUNTER — Other Ambulatory Visit (HOSPITAL_COMMUNITY): Payer: Self-pay | Admitting: Hematology

## 2021-12-23 DIAGNOSIS — R110 Nausea: Secondary | ICD-10-CM

## 2021-12-23 DIAGNOSIS — Z79811 Long term (current) use of aromatase inhibitors: Secondary | ICD-10-CM

## 2022-01-16 ENCOUNTER — Inpatient Hospital Stay: Payer: Medicare Other | Attending: Hematology

## 2022-01-24 ENCOUNTER — Ambulatory Visit: Payer: Medicare Other | Attending: Cardiology | Admitting: *Deleted

## 2022-01-24 DIAGNOSIS — Z5181 Encounter for therapeutic drug level monitoring: Secondary | ICD-10-CM | POA: Diagnosis not present

## 2022-01-24 DIAGNOSIS — I4891 Unspecified atrial fibrillation: Secondary | ICD-10-CM

## 2022-01-24 LAB — POCT INR: INR: 2.8 (ref 2.0–3.0)

## 2022-01-24 NOTE — Patient Instructions (Signed)
Continue warfarin 1/2 tablet daily except 1 tablet on Mondays and Fridays  Recheck in 6 weeks.

## 2022-02-17 ENCOUNTER — Inpatient Hospital Stay: Payer: Medicare Other

## 2022-02-19 ENCOUNTER — Inpatient Hospital Stay: Payer: Medicare Other | Attending: Hematology

## 2022-02-19 ENCOUNTER — Inpatient Hospital Stay: Payer: Medicare Other

## 2022-02-19 ENCOUNTER — Encounter (HOSPITAL_COMMUNITY): Payer: Self-pay | Admitting: Hematology

## 2022-02-19 ENCOUNTER — Other Ambulatory Visit: Payer: Medicare Other

## 2022-02-19 VITALS — BP 114/73 | HR 78 | Temp 98.2°F | Resp 18

## 2022-02-19 DIAGNOSIS — E538 Deficiency of other specified B group vitamins: Secondary | ICD-10-CM | POA: Diagnosis not present

## 2022-02-19 DIAGNOSIS — C50911 Malignant neoplasm of unspecified site of right female breast: Secondary | ICD-10-CM

## 2022-02-19 DIAGNOSIS — M8580 Other specified disorders of bone density and structure, unspecified site: Secondary | ICD-10-CM

## 2022-02-19 DIAGNOSIS — Z79899 Other long term (current) drug therapy: Secondary | ICD-10-CM

## 2022-02-19 DIAGNOSIS — Z79811 Long term (current) use of aromatase inhibitors: Secondary | ICD-10-CM

## 2022-02-19 LAB — COMPREHENSIVE METABOLIC PANEL
ALT: 15 U/L (ref 0–44)
AST: 21 U/L (ref 15–41)
Albumin: 3.7 g/dL (ref 3.5–5.0)
Alkaline Phosphatase: 43 U/L (ref 38–126)
Anion gap: 8 (ref 5–15)
BUN: 18 mg/dL (ref 8–23)
CO2: 22 mmol/L (ref 22–32)
Calcium: 9.2 mg/dL (ref 8.9–10.3)
Chloride: 108 mmol/L (ref 98–111)
Creatinine, Ser: 1.14 mg/dL — ABNORMAL HIGH (ref 0.44–1.00)
GFR, Estimated: 48 mL/min — ABNORMAL LOW (ref >60)
Glucose, Bld: 95 mg/dL (ref 70–99)
Potassium: 3.9 mmol/L (ref 3.5–5.1)
Sodium: 138 mmol/L (ref 135–145)
Total Bilirubin: 0.7 mg/dL (ref 0.3–1.2)
Total Protein: 6.4 g/dL — ABNORMAL LOW (ref 6.5–8.1)

## 2022-02-19 MED ORDER — CYANOCOBALAMIN 1000 MCG/ML IJ SOLN
1000.0000 ug | Freq: Once | INTRAMUSCULAR | Status: AC
  Administered 2022-02-19: 1000 ug via INTRAMUSCULAR
  Filled 2022-02-19: qty 1

## 2022-02-19 MED ORDER — DENOSUMAB 60 MG/ML ~~LOC~~ SOSY
60.0000 mg | PREFILLED_SYRINGE | Freq: Once | SUBCUTANEOUS | Status: AC
  Administered 2022-02-19: 60 mg via SUBCUTANEOUS
  Filled 2022-02-19: qty 1

## 2022-02-19 NOTE — Progress Notes (Signed)
Stacie Huang presents today for injection per the provider's orders.  B12 and Prolia administration without incident; injection site WNL; see MAR for injection details.  Patient tolerated procedure well and without incident.  No questions or complaints noted at this time. Pt's Calcium is 9.2 today.  Pt reports taking Calcium and Vit D supplements as directed. Pt denies any tooth or jaw pain at this time. No recent or future dental appointments at this time.   Discharged from clinic ambulatory in stable condition. Alert and oriented x 3. F/U with [facility] as scheduled.

## 2022-02-19 NOTE — Patient Instructions (Signed)
[facility]  Discharge Instructions: Thank you for choosing [facility] to provide your oncology and hematology care.  If you have a lab appointment with the [facility], please come in thru the Main Entrance and check in at the main information desk.  Wear comfortable clothing and clothing appropriate for easy access to any Portacath or PICC line.   We strive to give you quality time with your provider. You may need to reschedule your appointment if you arrive late (15 or more minutes).  Arriving late affects you and other patients whose appointments are after yours.  Also, if you miss three or more appointments without notifying the office, you may be dismissed from the clinic at the provider's discretion.      For prescription refill requests, have your pharmacy contact our office and allow 72 hours for refills to be completed.    Today you received B12 injection and Prolia injection.     BELOW ARE SYMPTOMS THAT SHOULD BE REPORTED IMMEDIATELY: *FEVER GREATER THAN 100.4 F (38 C) OR HIGHER *CHILLS OR SWEATING *NAUSEA AND VOMITING THAT IS NOT CONTROLLED WITH YOUR NAUSEA MEDICATION *UNUSUAL SHORTNESS OF BREATH *UNUSUAL BRUISING OR BLEEDING *URINARY PROBLEMS (pain or burning when urinating, or frequent urination) *BOWEL PROBLEMS (unusual diarrhea, constipation, pain near the anus) TENDERNESS IN MOUTH AND THROAT WITH OR WITHOUT PRESENCE OF ULCERS (sore throat, sores in mouth, or a toothache) UNUSUAL RASH, SWELLING OR PAIN  UNUSUAL VAGINAL DISCHARGE OR ITCHING   Items with * indicate a potential emergency and should be followed up as soon as possible or go to the Emergency Department if any problems should occur.  Please show the CHEMOTHERAPY ALERT CARD or IMMUNOTHERAPY ALERT CARD at check-in to the Emergency Department and triage nurse.  Should you have questions after your visit or need to cancel or reschedule your appointment, please contact  [facility] [phone]  and follow the prompts.  Office hours are 8:00 a.m. to 4:30 p.m. Monday - Friday. Please note that voicemails left after 4:00 p.m. may not be returned until the following business day.  We are closed weekends and major holidays. You have access to a nurse at all times for urgent questions. Please call the main number to the clinic [phone] and follow the prompts.  For any non-urgent questions, you may also contact your provider using MyChart. We now offer e-Visits for anyone 16 and older to request care online for non-urgent symptoms. For details visit [email or web address].   Also download the MyChart app! Go to the app store, search "MyChart", open the app, select [facility], and log in with your MyChart username and password.  Masks are optional in the cancer centers. If you would like for your care team to wear a mask while they are taking care of you, please let them know. You may have one support person who is at least 82 years old accompany you for your appointments.

## 2022-02-24 ENCOUNTER — Ambulatory Visit: Payer: Medicare Other

## 2022-02-24 ENCOUNTER — Other Ambulatory Visit: Payer: Medicare Other

## 2022-03-06 ENCOUNTER — Ambulatory Visit: Payer: Medicare Other | Attending: Cardiology | Admitting: *Deleted

## 2022-03-06 DIAGNOSIS — I4891 Unspecified atrial fibrillation: Secondary | ICD-10-CM

## 2022-03-06 DIAGNOSIS — Z5181 Encounter for therapeutic drug level monitoring: Secondary | ICD-10-CM

## 2022-03-06 LAB — POCT INR: INR: 3.4 — AB (ref 2.0–3.0)

## 2022-03-06 NOTE — Patient Instructions (Signed)
Hold warfarin tonight then resume 1/2 tablet daily except 1 tablet on Mondays and Fridays Eat greens or salad today  Recheck in 6 weeks.

## 2022-03-14 ENCOUNTER — Emergency Department (HOSPITAL_COMMUNITY)
Admission: EM | Admit: 2022-03-14 | Discharge: 2022-03-14 | Disposition: A | Discharge status: Home / Self Care | Payer: Medicare Other | Attending: Emergency Medicine | Admitting: Emergency Medicine

## 2022-03-14 ENCOUNTER — Emergency Department (HOSPITAL_COMMUNITY): Payer: Medicare Other

## 2022-03-14 DIAGNOSIS — Z853 Personal history of malignant neoplasm of breast: Secondary | ICD-10-CM | POA: Diagnosis not present

## 2022-03-14 DIAGNOSIS — J189 Pneumonia, unspecified organism: Secondary | ICD-10-CM | POA: Diagnosis not present

## 2022-03-14 DIAGNOSIS — R059 Cough, unspecified: Secondary | ICD-10-CM | POA: Diagnosis present

## 2022-03-14 DIAGNOSIS — I10 Essential (primary) hypertension: Secondary | ICD-10-CM | POA: Insufficient documentation

## 2022-03-14 DIAGNOSIS — Z79899 Other long term (current) drug therapy: Secondary | ICD-10-CM | POA: Diagnosis not present

## 2022-03-14 DIAGNOSIS — Z20822 Contact with and (suspected) exposure to covid-19: Secondary | ICD-10-CM | POA: Insufficient documentation

## 2022-03-14 LAB — RESP PANEL BY RT-PCR (FLU A&B, COVID) ARPGX2
Influenza A by PCR: NEGATIVE
Influenza B by PCR: NEGATIVE
SARS Coronavirus 2 by RT PCR: NEGATIVE

## 2022-03-14 MED ORDER — BENZONATATE 100 MG PO CAPS: 100.0000 mg | ORAL_CAPSULE | Freq: Once | ORAL | Status: DC

## 2022-03-14 MED ORDER — BENZONATATE 100 MG PO CAPS: 100.0000 mg | ORAL_CAPSULE | Freq: Three times a day (TID) | ORAL | 0 refills | Status: DC

## 2022-03-14 MED ORDER — AMOXICILLIN 500 MG PO CAPS: 500.0000 mg | ORAL_CAPSULE | Freq: Three times a day (TID) | ORAL | 0 refills | Status: DC

## 2022-03-14 MED ORDER — DOXYCYCLINE HYCLATE 100 MG PO CAPS: 100.0000 mg | ORAL_CAPSULE | Freq: Two times a day (BID) | ORAL | 0 refills | Status: DC

## 2022-03-14 NOTE — ED Triage Notes (Signed)
Pt to ED c/o cough and congestion x 2 weeks. No known sick contacts.

## 2022-03-14 NOTE — ED Provider Notes (Signed)
[facility] EMERGENCY DEPARTMENT Provider Note   CSN: [id number] Arrival date & time: 03/14/22  1108     History  Chief Complaint  Patient presents with   Cough    Stacie Huang is a 81 y.o. female.  The history is provided by the patient and medical records. No language interpreter was used.  Cough    Patient is an 81 year old female presenting to the ED with CC cough for one week. Patient states cough is continuous but worse at night. It is a productive cough with yellow mucus. No aggravating factors noted. Patient has taken an unspecified OTC "cough medicine" that has reduced cough slightly. Patient denies hemoptysis. Patient denies new medications. Patient reports fatigue, malaise, chills, diffuse body aches, nasal congestion. Patient denies fever, SOB, tachycardia, memory loss, confusion, nuchal rigidity.  Home Medications Prior to Admission medications   Medication Sig Start Date End Date Taking? Authorizing Provider  anastrozole (ARIMIDEX) 1 MG tablet TAKE ONE TABLET BY MOUTH ONCE DAILY. 11/01/21   Derek Jack, MD  atenolol (TENORMIN) 100 MG tablet Take 1 tablet (100 mg total) by mouth daily. 09/12/19   Imogene Burn, PA-C  calcium carbonate (OS-CAL) 600 MG TABS tablet Take 600 mg by mouth 2 (two) times daily with a meal.     [provider]  cetirizine (ZYRTEC) 10 MG tablet Take 10 mg by mouth every morning.    [provider]  Cholecalciferol (VITAMIN D) 400 UNITS capsule Take 400 Units by mouth 2 (two) times daily.    [provider]  Cyanocobalamin 2000 MCG/ML SOLN Inject 1 mL as directed. Patient injects once a month.  Is not sure of dose.    [provider]  denosumab (PROLIA) 60 MG/ML SOLN injection Inject 60 mg into the skin every 6 (six) months. Administer in upper arm, thigh, or abdomen    [provider]  diltiazem (CARDIZEM CD) 120 MG 24 hr capsule TAKE (1) CAPSULE BY MOUTH ONCE A DAY. 10/11/21   Arnoldo Lenis, MD  fluticasone (FLONASE) 50 MCG/ACT nasal spray Place 2 sprays into both nostrils at bedtime.    [provider]  LORazepam (ATIVAN) 1 MG tablet Take 1 mg by mouth at bedtime.    [provider]  metoCLOPramide (REGLAN) 10 MG tablet TAKE 1 TABLET BY MOUTH EVERY 8 HOURS AS NEEDED FOR NAUSEA. 12/23/21   Derek Jack, MD  pantoprazole (PROTONIX) 40 MG tablet TAKE (1) TABLET BY MOUTH TWICE DAILY. 08/09/15   Annitta Needs, NP  psyllium (METAMUCIL) 58.6 % powder Take 1 packet by mouth daily.     [provider]  simvastatin (ZOCOR) 10 MG tablet Take 10 mg by mouth at bedtime.    [provider]  warfarin (COUMADIN) 2.5 MG tablet TAKE 1/2 TABLET BY MOUTH DAILY EXCEPT 1 TABLET ON MONDAY AND FRIDAYS. 09/12/21   Arnoldo Lenis, MD      Allergies    Iohexol, Sulfa antibiotics, and Sulfasalazine    Review of Systems   Review of Systems  Respiratory:  Positive for cough.   All other systems reviewed and are negative.   Physical Exam Updated Vital Signs BP 110/80   Pulse 68   Temp 98.9 F (37.2 C) (Oral)   Resp 16   Ht '5\' 1"'$  (1.549 m)   Wt 64.4 kg   SpO2 98%   BMI 26.83 kg/m  Physical Exam Vitals and nursing note reviewed.  Constitutional:      General: She  is not in acute distress.    Appearance: She is well-developed.  HENT:     Head: Atraumatic.  Eyes:     Conjunctiva/sclera: Conjunctivae normal.  Cardiovascular:     Rate and Rhythm: Normal rate.  Pulmonary:     Effort: Pulmonary effort is normal.     Breath sounds: No wheezing, rhonchi or rales.  Abdominal:     Palpations: Abdomen is soft.     Tenderness: There is no abdominal tenderness.  Musculoskeletal:     Cervical back: Neck supple.  Skin:    Findings: No rash.  Neurological:     Mental Status: She is alert.  Psychiatric:        Mood and Affect: Mood normal.     ED Results / Procedures / Treatments   Labs (all labs ordered are listed, but only abnormal results  are displayed) Labs Reviewed  RESP PANEL BY RT-PCR (FLU A&B, COVID) [id number]    EKG None  Radiology No results found.  Procedures Procedures    Medications Ordered in ED Medications - No data to display  ED Course/ Medical Decision Making/ A&P                           Medical Decision Making  BP 110/80   Pulse 68   Temp 98.9 F (37.2 C) (Oral)   Resp 16   Ht '5\' 1"'$  (1.549 m)   Wt 64.4 kg   SpO2 98%   BMI 26.83 kg/m   4:00 PM This is an 81 year old female presenting with complaints of cough.  Patient endorsed a productive cough ongoing for the past week.  She also endorsed some mild body aches and some congestion.  She does not endorse any significant fever no nausea vomiting or diarrhea no shortness of breath.  She tries over-the-counter medication with minimal improvement.  She denies any recent sick contact.  She denies any recent change in medication or environmental changes.  On exam this is an elderly female laying in bed appears to be in no acute discomfort.  Heart lung sounds normal.  Abdomen is soft nontender.  She does not have any peripheral edema concerning for CHF.  She is mentating appropriately.  Vital sign history reassuring.  She is afebrile, normal blood pressure, normal heart rate, no hypoxia.  -Labs ordered, independently viewed and interpreted by me.  Labs remarkable for negative covid and flu test -The patient was maintained on a cardiac monitor.  I personally viewed and interpreted the cardiac monitored which showed an underlying rhythm of: NSR -Imaging independently viewed and interpreted by me and I agree with radiologist's interpretation.  Result remarkable for CXR showing mild left basilar scarring or atelectasis -This patient presents to the ED for concern of cough, this involves an extensive number of treatment options, and is a complaint that carries with it a high risk of complications and morbidity.  The differential diagnosis includes pneumonia,  drug induced cough, viral illness, seasonal allergy, allergic reaction -Co morbidities that complicate the patient evaluation includes afib, HTN, breast CA, GERD -Treatment includes tessalon -Reevaluation of the patient after these medicines showed that the patient stayed the same -PCP office notes or outside notes reviewed -Discussion with attending Dr. Regenia Skeeter -Escalation to admission/observation considered: patients feels much better, is comfortable with discharge, and will follow up with PCP -Prescription medication considered, patient comfortable with amox/doxy for atypical pna -Social Determinant of Health considered  4:05 PM Patient endorsed productive  cough ongoing for the past week.  Patient has negative for COVID and flu.  Chest x-ray shows mild left basilar scarring or atelectasis.  In the setting of having some congestion, cough, and some mild body aches along with a questionable chest x-ray I felt patient would best receive antibiotic treatment for suspected pneumonia.  We will prescribe amoxicillin and doxycycline.  I have considered her GERD induced cough, PE, and other source but felt it is less likely.  I have low suspicion for PE as patient is currently on warfarin.  Patient discharged home home with doxycycline, amoxicillin, and Tessalon Per[place].  Made patient aware that the antibiotic can potentially affect the potency of her Coumadin and it should be checked more regularly.  Discontinue medication if bleeding occurs.         Final Clinical Impression(s) / ED Diagnoses Final diagnoses:  Community acquired pneumonia of left lower lobe of lung    Rx / DC Orders ED Discharge Orders          Ordered    amoxicillin (AMOXIL) 500 MG capsule  3 times daily        03/14/22 1622    benzonatate (TESSALON) 100 MG capsule  Every 8 hours        03/14/22 1622    doxycycline (VIBRAMYCIN) 100 MG capsule  2 times daily        03/14/22 1622              Domenic Moras,  PA-C 03/14/22 1623    Sherwood Gambler, MD 03/15/22 [phone]

## 2022-03-14 NOTE — ED Notes (Signed)
Pt transported to xray 

## 2022-03-14 NOTE — Discharge Instructions (Addendum)
You have been evaluated for symptoms.  Your chest x-ray shows a possible pneumonia involving your left lower lung.  Please take antibiotics as prescribed.  Take cough medication as needed for cough.  Please be aware that sometimes antibiotic may affect the potency of your Coumadin level therefore if you develop any abnormal bleeding please discontinue the medication and return for further assessment.

## 2022-03-17 ENCOUNTER — Telehealth: Payer: Self-pay | Admitting: Cardiology

## 2022-03-17 NOTE — Telephone Encounter (Signed)
Patient called stating she is currently on antibiotics and needs her warfarin adjusted.

## 2022-03-17 NOTE — Telephone Encounter (Signed)
Pt went to [facility] ED on 03/14/22 for cough.  Dx: mild pneumonia.  Started on Amoxicillin '500mg'$  tid x 5 days and Doxycycline '100mg'$  bid x 7 days.  Told pt to decrease warfarin to 1/2 tablet daily until finished with Abx then resume previous dose. Keep scheduled INR appt.  She verbalized understanding.

## 2022-03-21 ENCOUNTER — Ambulatory Visit (HOSPITAL_COMMUNITY): Payer: Medicare Other

## 2022-03-24 ENCOUNTER — Inpatient Hospital Stay: Payer: Medicare Other | Attending: Hematology

## 2022-03-24 ENCOUNTER — Inpatient Hospital Stay: Payer: Medicare Other

## 2022-03-24 ENCOUNTER — Ambulatory Visit: Payer: Medicare Other

## 2022-03-24 VITALS — BP 138/83 | HR 62 | Temp 97.9°F | Resp 18

## 2022-03-24 DIAGNOSIS — E538 Deficiency of other specified B group vitamins: Secondary | ICD-10-CM | POA: Diagnosis not present

## 2022-03-24 DIAGNOSIS — C50911 Malignant neoplasm of unspecified site of right female breast: Secondary | ICD-10-CM | POA: Insufficient documentation

## 2022-03-24 DIAGNOSIS — Z79811 Long term (current) use of aromatase inhibitors: Secondary | ICD-10-CM | POA: Diagnosis not present

## 2022-03-24 DIAGNOSIS — Z79899 Other long term (current) drug therapy: Secondary | ICD-10-CM

## 2022-03-24 DIAGNOSIS — C50912 Malignant neoplasm of unspecified site of left female breast: Secondary | ICD-10-CM | POA: Insufficient documentation

## 2022-03-24 DIAGNOSIS — M8580 Other specified disorders of bone density and structure, unspecified site: Secondary | ICD-10-CM | POA: Diagnosis not present

## 2022-03-24 LAB — COMPREHENSIVE METABOLIC PANEL
ALT: 13 U/L (ref 0–44)
AST: 20 U/L (ref 15–41)
Albumin: 3.7 g/dL (ref 3.5–5.0)
Alkaline Phosphatase: 43 U/L (ref 38–126)
Anion gap: 7 (ref 5–15)
BUN: 14 mg/dL (ref 8–23)
CO2: 20 mmol/L — ABNORMAL LOW (ref 22–32)
Calcium: 8.8 mg/dL — ABNORMAL LOW (ref 8.9–10.3)
Chloride: 111 mmol/L (ref 98–111)
Creatinine, Ser: 1.1 mg/dL — ABNORMAL HIGH (ref 0.44–1.00)
GFR, Estimated: 50 mL/min — ABNORMAL LOW (ref >60)
Glucose, Bld: 105 mg/dL — ABNORMAL HIGH (ref 70–99)
Potassium: 4.2 mmol/L (ref 3.5–5.1)
Sodium: 138 mmol/L (ref 135–145)
Total Bilirubin: 0.5 mg/dL (ref 0.3–1.2)
Total Protein: 6.8 g/dL (ref 6.5–8.1)

## 2022-03-24 LAB — CBC WITH DIFFERENTIAL/PLATELET
Abs Immature Granulocytes: 0.18 10*3/uL — ABNORMAL HIGH (ref 0.00–0.07)
Basophils Absolute: 0.1 10*3/uL (ref 0.0–0.1)
Basophils Relative: 1 %
Eosinophils Absolute: 0.1 10*3/uL (ref 0.0–0.5)
Eosinophils Relative: 1 %
HCT: 39.7 % (ref 36.0–46.0)
Hemoglobin: 12.6 g/dL (ref 12.0–15.0)
Immature Granulocytes: 2 %
Lymphocytes Relative: 34 %
Lymphs Abs: 2.7 10*3/uL (ref 0.7–4.0)
MCH: 29.9 pg (ref 26.0–34.0)
MCHC: 31.7 g/dL (ref 30.0–36.0)
MCV: 94.1 fL (ref 80.0–100.0)
Monocytes Absolute: 0.8 10*3/uL (ref 0.1–1.0)
Monocytes Relative: 10 %
Neutro Abs: 4.2 10*3/uL (ref 1.7–7.7)
Neutrophils Relative %: 52 %
Platelets: 332 10*3/uL (ref 150–400)
RBC: 4.22 MIL/uL (ref 3.87–5.11)
RDW: 14.2 % (ref 11.5–15.5)
WBC: 8.1 10*3/uL (ref 4.0–10.5)
nRBC: 0 % (ref 0.0–0.2)

## 2022-03-24 LAB — VITAMIN D 25 HYDROXY (VIT D DEFICIENCY, FRACTURES): Vit D, 25-Hydroxy: 56.16 ng/mL (ref 30–100)

## 2022-03-24 MED ORDER — CYANOCOBALAMIN 1000 MCG/ML IJ SOLN
1000.0000 ug | Freq: Once | INTRAMUSCULAR | Status: AC
  Administered 2022-03-24: 1000 ug via INTRAMUSCULAR
  Filled 2022-03-24: qty 1

## 2022-03-24 NOTE — Patient Instructions (Signed)
[facility]  Discharge Instructions: Thank you for choosing [facility] to provide your oncology and hematology care.  If you have a lab appointment with the Cancer Center, please come in thru the Main Entrance and check in at the main information desk.  Wear comfortable clothing and clothing appropriate for easy access to any Portacath or PICC line.   We strive to give you quality time with your provider. You may need to reschedule your appointment if you arrive late (15 or more minutes).  Arriving late affects you and other patients whose appointments are after yours.  Also, if you miss three or more appointments without notifying the office, you may be dismissed from the clinic at the provider's discretion.      For prescription refill requests, have your pharmacy contact our office and allow 72 hours for refills to be completed.     To help prevent nausea and vomiting after your treatment, we encourage you to take your nausea medication as directed.  BELOW ARE SYMPTOMS THAT SHOULD BE REPORTED IMMEDIATELY: *FEVER GREATER THAN 100.4 F (38 C) OR HIGHER *CHILLS OR SWEATING *NAUSEA AND VOMITING THAT IS NOT CONTROLLED WITH YOUR NAUSEA MEDICATION *UNUSUAL SHORTNESS OF BREATH *UNUSUAL BRUISING OR BLEEDING *URINARY PROBLEMS (pain or burning when urinating, or frequent urination) *BOWEL PROBLEMS (unusual diarrhea, constipation, pain near the anus) TENDERNESS IN MOUTH AND THROAT WITH OR WITHOUT PRESENCE OF ULCERS (sore throat, sores in mouth, or a toothache) UNUSUAL RASH, SWELLING OR PAIN  UNUSUAL VAGINAL DISCHARGE OR ITCHING   Items with * indicate a potential emergency and should be followed up as soon as possible or go to the Emergency Department if any problems should occur.  Please show the CHEMOTHERAPY ALERT CARD or IMMUNOTHERAPY ALERT CARD at check-in to the Emergency Department and triage nurse.  Should you have questions after your visit or need to  cancel or reschedule your appointment, please contact [facility] [phone]  and follow the prompts.  Office hours are 8:00 a.m. to 4:30 p.m. Monday - Friday. Please note that voicemails left after 4:00 p.m. may not be returned until the following business day.  We are closed weekends and major holidays. You have access to a nurse at all times for urgent questions. Please call the main number to the clinic [phone] and follow the prompts.  For any non-urgent questions, you may also contact your provider using MyChart. We now offer e-Visits for anyone 18 and older to request care online for non-urgent symptoms. For details visit [email or web address].   Also download the MyChart app! Go to the app store, search "MyChart", open the app, select [facility], and log in with your MyChart username and password.  Masks are optional in the cancer centers. If you would like for your care team to wear a mask while they are taking care of you, please let them know. You may have one support person who is at least 82 years old accompany you for your appointments.  

## 2022-03-24 NOTE — Progress Notes (Signed)
Patient tolerated injection with no complaints voiced.  Site clean and dry with no bruising or swelling noted at site.  See MAR for details.  Band aid applied.  Patient stable during and after injection.  Vss with discharge and left in satisfactory condition with no s/s of distress noted.  

## 2022-03-28 ENCOUNTER — Other Ambulatory Visit (HOSPITAL_COMMUNITY): Payer: Self-pay | Admitting: Hematology

## 2022-03-28 DIAGNOSIS — R110 Nausea: Secondary | ICD-10-CM

## 2022-03-28 DIAGNOSIS — Z79811 Long term (current) use of aromatase inhibitors: Secondary | ICD-10-CM

## 2022-03-30 NOTE — Progress Notes (Unsigned)
Stacie Huang,  [id number]   CLINIC:  Medical Oncology/Hematology  PCP:  Lemmie Evens, MD [address] / [place] [address] [phone]   REASON FOR VISIT:  Follow-up for stage Ia left breast cancer (2014) and stage Ia right breast cancer (2017)  BRIEF ONCOLOGIC HISTORY:  Oncology History  Malignant neoplasm of right female breast ([place])  02/04/2013 Initial Diagnosis   Breast cancer of upper-inner quadrant of left female breast   02/23/2013 Surgery   left lumpectomy (UIQ), sentinel node biopsy--0.7cm, Node negative, ER+, PR+, HER-2/neu over expressed.   04/08/2013 - 04/14/2013 Chemotherapy   Paclitaxel/Herceptin weekly x 12. Intolerance to Paclitaxel and only receiving 2 cycles   05/04/2013 - 04/05/2014 Chemotherapy   Herceptin every 21 days    06/28/2013 -  Chemotherapy   Anastrazole started   08/02/2013 Imaging   Bone density- normal   04/06/2014 Imaging   MUGA- Left ventricular ejection fraction equals 53%.   08/06/2015 Imaging   Bone density- osteopenia   03/06/2016 Procedure   Stereotactic-guided biopsy of right breast upper outer quadrant focal asymmetry/calcifications. No apparent complications.   03/06/2016 Procedure   Successful placement of coil shaped marker within the right breast upper outer quadrant biopsy site, post stereotactic core needle biopsy.   Invasive ductal carcinoma of breast, female, right ([place])  02/26/2016 Mammogram   1. Focal asymmetry within the upper-outer quadrant of the right breast, with associated new calcifications. This is a suspicious finding for which stereotactic biopsy with 3D tomosynthesis guidance is recommended. 2. Cluster of cysts at the 10 o'clock axis, 5 cm from the nipple, measuring 1.2 x 0.6 x 1.1 cm, a possible correlate for the mammographic asymmetry.   03/06/2016 Procedure   Right needle core biopsy   03/07/2016 Pathology Results   Breast, right, needle core  biopsy, upper outer quadrant - HIGH GRADE DUCTAL CARCINOMA IN SITU WITH NECROSIS AND ASSOCIATED CALCIFICATIONS.IMMUNOHISTOCHEMICAL AND MORPHOMETRIC ANALYSIS PERFORMED MANUALLY Estrogen Receptor: 0%, NEGATIVE Progesterone Receptor: 0%, NEGATIVE   04/02/2016 Procedure   Breast, lumpectomy, right by Dr. Arnoldo Morale   04/07/2016 Pathology Results   INVASIVE DUCTAL CARCINOMA, GRADE 2, SPANNING 0.6 CM THE CARCINOMA IS FOCALLY PRESENTED AT THE CAUTERIZED MEDIAL MARGIN EXTENSIVE DUCTAL CARCINOMA IN SITU, GRADE 3 ALL OTHER SURGICAL MARGIN ARE NEGATIVE FOR CARCINOMA ER 0% PR 0% Ki-67 15% HER2 NEGATIVE   05/05/2016 Pathology Results   MammaPrint: LOW RISK (97.8% of low risk Mammaprint patients who were treated with anti-hormonal therapy alone are living without distant recurrence of breast cancer at 5-years).   06/02/2016 - 07/04/2016 Radiation Therapy   XRT in [place], [place].  Right breast 42.56 Gy delivered in 16 fractions at 2.65 Gy per fraction and a right tumor bed boost 16 Gy delivered in 8 fractions at 2 Gy per fraction for a total dose of 58.56 Gy completed on 07/04/2016.     CANCER STAGING: Cancer Staging  Invasive ductal carcinoma of breast, female, right [facility]) Staging form: Breast, AJCC 8th Edition - Pathologic stage from 05/08/2016: Stage IB (pT1b, pN0, cM0, G2, ER-, PR-, HER2-) - Signed by Baird Cancer, PA-C on 05/08/2016  Malignant neoplasm of right female breast [facility]) Staging form: Breast, AJCC 7th Edition - Clinical stage from 03/16/2013: Stage IA (T1b, N0, cM0, Free text: stage I) - Unsigned - Pathologic: Stage IA (T1b, N0, cM0) - Signed by Farrel Gobble, MD on 03/17/2013   INTERVAL HISTORY:  Stacie Huang, a 81 y.o. female, returns for routine  follow-up of her left and right-sided breast cancers. Stacie Huang was last seen on 04/10/2021 by Dr. Delton Coombes.   At today's visit, she  reports feeling fairly well.  She denies any recent hospitalizations, surgeries, or changes in her   baseline health status.  She denies any symptoms of recurrence such as new lumps, bone pain, chest pain, dyspnea, or abdominal pain.   She has no new headaches, seizures, or focal neurologic deficits.  No B symptoms such as fever, chills, night sweats, unintentional weight loss.  She continues to take daily Arimidex and is tolerating it well without any hot flashes or abnormal musculoskeletal pain.  She continues to take daily calcium/vitamin D and receives monthly B12 injections.  She reports 100% energy and 100% appetite.  She is maintaining stable weight at this time.   REVIEW OF SYSTEMS: Review of Systems  Constitutional:  Negative for appetite change, chills, diaphoresis, fatigue, fever and unexpected weight change.  HENT:   Negative for lump/mass and nosebleeds.   Eyes:  Negative for eye problems.  Respiratory:  Negative for cough, hemoptysis and shortness of breath.   Cardiovascular:  Negative for chest pain, leg swelling and palpitations.  Gastrointestinal:  Negative for abdominal pain, blood in stool, constipation, diarrhea, nausea and vomiting.  Genitourinary:  Negative for hematuria.   Skin: Negative.   Neurological:  Negative for dizziness, headaches and light-headedness.  Hematological:  Does not bruise/bleed easily.    PAST MEDICAL/SURGICAL HISTORY:  Past Medical History:  Diagnosis Date   Anxiety    Breast cancer ([place])    Cancer ([place] [facility])    Chronic anticoagulation    GERD (gastroesophageal reflux disease)    Hyperlipidemia    Hypertension    Invasive ductal carcinoma of breast, female, right ([place]) 05/08/2016   Osteopenia determined by x-ray 10/03/2015   Paroxysmal atrial fibrillation ([place])    Past Surgical History:  Procedure Laterality Date   ABDOMINAL HYSTERECTOMY     AXILLARY LYMPH NODE DISSECTION Left 02/23/2013   Procedure: LEFT AXILLARY LYMPH NODE DISSECTION;  Surgeon: Scherry Ran, MD;  Location: [facility];  Service: General;  Laterality: Left;    CHOLECYSTECTOMY     COLONOSCOPY  03/10/2002   RMR: Incomplete colonoscopy (sigmoidoscopy)/Normal rectum/Normal colon to 40 cm.    ESOPHAGOGASTRODUODENOSCOPY  03/10/2002   UEA:VWUJWJ esophagus/small HH/Tiny AVM in antrum, otherwise normal stomach and D1 and D2/Status post passage of the [facility] dilator   PARTIAL MASTECTOMY WITH AXILLARY SENTINEL LYMPH NODE BIOPSY Left 02/23/2013   Procedure: LEFT PARTIAL MASTECTOMY WITH AXILLARY SIMPLE NODE BIOPSY, LEFT BREAST WIDE EXCISION;  Surgeon: Scherry Ran, MD;  Location: [facility];  Service: General;  Laterality: Left;   PARTIAL MASTECTOMY WITH NEEDLE LOCALIZATION Right 04/02/2016   Procedure: RIGHT PARTIAL MASTECTOMY AFTER NEEDLE LOCALIZATION;  Surgeon: Aviva Signs, MD;  Location: [facility];  Service: General;  Laterality: Right;   PORT-A-CATH REMOVAL Right 07/31/2017   Procedure: MINOR REMOVAL PORT-A-CATH;  Surgeon: Aviva Signs, MD;  Location: [facility];  Service: General;  Laterality: Right;   PORTACATH PLACEMENT Right 04/07/2013   PORTACATH PLACEMENT Right 04/07/2013   Procedure: INSERTION PORT-A-CATH;  Surgeon: Scherry Ran, MD;  Location: [facility];  Service: General;  Laterality: Right;    SOCIAL HISTORY:  Social History   Socioeconomic History   Marital status: Legally Separated    Spouse name: Not on file   Number of children:  2   Years of education: Not on file   Highest education level: Not on file  Occupational History  Occupation: Retired from [employer] facility  Tobacco Use   Smoking status: Never   Smokeless tobacco: Never  Vaping Use   Vaping Use: Never used  Substance and Sexual Activity   Alcohol use: No   Drug use: No   Sexual activity: Yes    Birth control/protection: Surgical  Other Topics Concern   Not on file  Social History Narrative   2 adult children   Social Determinants of Health   Financial Resource Strain: Not on file  Food Insecurity: Not on file  Transportation Needs: Not on file  Physical  Activity: Not on file  Stress: Not on file  Social Connections: Not on file  Intimate Partner Violence: Not on file    FAMILY HISTORY:  Family History  Problem Relation Age of Onset   Heart disease Mother        Also 46 of 9 siblings   Heart attack Father    Leukemia Other    Leukemia Other    Colon cancer Neg Hx     CURRENT MEDICATIONS:  Current Outpatient Medications  Medication Sig Dispense Refill   amoxicillin (AMOXIL) 500 MG capsule Take 1 capsule (500 mg total) by mouth 3 (three) times daily. 15 capsule 0   anastrozole (ARIMIDEX) 1 MG tablet TAKE ONE TABLET BY MOUTH ONCE DAILY. 90 tablet 1   atenolol (TENORMIN) 100 MG tablet Take 1 tablet (100 mg total) by mouth daily. 90 tablet 3   benzonatate (TESSALON) 100 MG capsule Take 1 capsule (100 mg total) by mouth every 8 (eight) hours. 21 capsule 0   calcium carbonate (OS-CAL) 600 MG TABS tablet Take 600 mg by mouth 2 (two) times daily with a meal.      cetirizine (ZYRTEC) 10 MG tablet Take 10 mg by mouth every morning.     Cholecalciferol (VITAMIN D) 400 UNITS capsule Take 400 Units by mouth 2 (two) times daily.     Cyanocobalamin 2000 MCG/ML SOLN Inject 1 mL as directed. Patient injects once a month.  Is not sure of dose.     denosumab (PROLIA) 60 MG/ML SOLN injection Inject 60 mg into the skin every 6 (six) months. Administer in upper arm, thigh, or abdomen     diltiazem (CARDIZEM CD) 120 MG 24 hr capsule TAKE (1) CAPSULE BY MOUTH ONCE A DAY. 90 capsule 1   doxycycline (VIBRAMYCIN) 100 MG capsule Take 1 capsule (100 mg total) by mouth 2 (two) times daily. One po bid x 7 days 10 capsule 0   fluticasone (FLONASE) 50 MCG/ACT nasal spray Place 2 sprays into both nostrils at bedtime.     LORazepam (ATIVAN) 1 MG tablet Take 1 mg by mouth at bedtime.     metoCLOPramide (REGLAN) 10 MG tablet TAKE 1 TABLET BY MOUTH EVERY 8 HOURS AS NEEDED FOR NAUSEA. 120 tablet 0   pantoprazole (PROTONIX) 40 MG tablet TAKE (1) TABLET BY MOUTH TWICE DAILY.  60 tablet 5   psyllium (METAMUCIL) 58.6 % powder Take 1 packet by mouth daily.      simvastatin (ZOCOR) 10 MG tablet Take 10 mg by mouth at bedtime.     warfarin (COUMADIN) 2.5 MG tablet TAKE 1/2 TABLET BY MOUTH DAILY EXCEPT 1 TABLET ON MONDAY AND FRIDAYS. 30 tablet 5   No current facility-administered medications for this visit.    ALLERGIES:  Allergies  Allergen Reactions   Iohexol Hives   Sulfa Antibiotics Rash   Sulfasalazine Rash    PHYSICAL EXAM: Performance status (ECOG): 0 -  Asymptomatic  There were no vitals filed for this visit. Wt Readings from Last 3 Encounters:  03/14/22 142 lb (64.4 kg)  08/12/21 143 lb 3.2 oz (65 kg)  04/10/21 144 lb (65.3 kg)   Physical Exam Constitutional:      Appearance: Normal appearance.  HENT:     Head: Normocephalic and atraumatic.     Mouth/Throat:     Mouth: Mucous membranes are moist.  Eyes:     Extraocular Movements: Extraocular movements intact.     Pupils: Pupils are equal, round, and reactive to light.  Cardiovascular:     Rate and Rhythm: Normal rate. Rhythm irregular.     Pulses: Normal pulses.     Heart sounds: Normal heart sounds.  Pulmonary:     Effort: Pulmonary effort is normal.     Breath sounds: Normal breath sounds.  Chest:    Abdominal:     General: Bowel sounds are normal.     Palpations: Abdomen is soft.     Tenderness: There is no abdominal tenderness.  Musculoskeletal:        General: No swelling.     Right lower leg: No edema.     Left lower leg: No edema.  Lymphadenopathy:     Cervical: No cervical adenopathy.  Skin:    General: Skin is warm and dry.  Neurological:     General: No focal deficit present.     Mental Status: She is alert and oriented to person, place, and time.  Psychiatric:        Mood and Affect: Mood normal.        Behavior: Behavior normal.      LABORATORY DATA:  I have reviewed the labs as listed.     Latest Ref Rng & Units 03/24/2022   11:43 AM 03/19/2021   12:03  PM 03/13/2020    1:02 PM  CBC  WBC 4.0 - 10.5 K/uL 8.1  6.7  6.5   Hemoglobin 12.0 - 15.0 g/dL 12.6  13.6  12.8   Hematocrit 36.0 - 46.0 % 39.7  40.8  39.7   Platelets 150 - 400 K/uL 332  287  262       Latest Ref Rng & Units 03/24/2022   11:43 AM 02/19/2022   12:47 PM 08/12/2021   11:04 AM  CMP  Glucose 70 - 99 mg/dL 105  95  107   BUN 8 - 23 mg/dL _0 Creatinine 0.44 - 1.00 mg/dL 1.10  1.14  1.13   Sodium 135 - 145 mmol/L 138  138  139   Potassium 3.5 - 5.1 mmol/L 4.2  3.9  4.1   Chloride 98 - 111 mmol/L 111  108  106   CO2 22 - 32 mmol/L _1 Calcium 8.9 - 10.3 mg/dL 8.8  9.2  9.9   Total Protein 6.5 - 8.1 g/dL 6.8  6.4  7.1   Total Bilirubin 0.3 - 1.2 mg/dL 0.5  0.7  0.7   Alkaline Phos 38 - 126 U/L 43  43  49   AST 15 - 41 U/L _2 ALT 0 - 44 U/L _3 DIAGNOSTIC IMAGING:  I have independently reviewed the scans and discussed with the patient. DG Chest 2 View  Result Date: 03/14/2022 CLINICAL DATA:  Cough, congestion EXAM: CHEST - 2 VIEW COMPARISON:  03/31/2016 FINDINGS: Stable mild cardiomegaly.  Aortic atherosclerosis. Mild left basilar scarring or atelectasis. No pleural effusion or pneumothorax. IMPRESSION: Mild left basilar scarring or atelectasis. Electronically Signed   By: Davina Poke D.O.   On: 03/14/2022 15:15     ASSESSMENT & PLAN:   1.  HISTORY OF LEFT BREAST FRTMYT(1173) +RIGHT BREAST CANCER (2017) Stage Ia (T1BN0M0) invasive ductal carcinoma the LEFT breast (2014): Diagnosed in October 2014, ER/PR/HER2 positive. She was treated with left lumpectomy followed by Taxol and Herceptin x12 weekly cycles.  Taxol discontinued after 2 cycles due to toxicities.  Herceptin was continued for 52 weeks. Anastrozole started in March 2015.  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 Status post right lumpectomy revealing TRIPLE NEGATIVE breast cancer. MammaPrint showed low risk  disease. No adjuvant chemotherapy was recommended.  Adjuvant XRT completed on 07/04/2016.  - She has been on anastrozole since March 2015.  Goal is 10 years of therapy.  She is tolerating anastrozole well.   - Physical examination today showed right breast lumpectomy scar in the upper outer quadrant unchanged. No palpable masses or adenopathy. - Mammogram on 03/19/2021 was BI-RADS Category 2.  She is scheduled for upcoming mammogram on 04/04/2022. - Labs (03/24/2022): Normal CBC.  LFTs normal on CMP. - PLAN: She is scheduled for upcoming mammogram on 04/04/2022. - Continue anastrozole for 10 years of therapy (through March 2025). - Labs and mammogram in 1 year followed by office visit and physical exam.    2.   Osteopenia: - DEXA bone density scan (03/02/2018) with T-score -1.5 - Patient is taking calcium and vitamin D - She has been on anastrozole since 2015 - She has been receiving Prolia every 6 months since June 2017 (most recently given 02/19/2022) - Most recent labs (03/24/2022) show mild hypocalcemia 8.8 with normal vitamin D 56.16 - PLAN: Recommend bone density scan, will call patient with results and any recommended changes to her therapy. - Continue vitamin D and calcium.  3.  Vitamin B12 deficiency: - History of severe vitamin B12 deficiency (Vitamin B12 was <50 in November 2021). - Vitamin B-12 last checked on 03/19/2021, had improved to 386. - Patient is receiving monthly B12 injections.  - PLAN: Continue monthly B12 injections.  We will check B12/MMA at follow-up visit in 1 year.   PLAN SUMMARY: >> DEXA/bone density scan  >> Mammogram scheduled for 04/04/2022 >> Monthly B12 injections + Prolia every 6 months >> Next annual mammogram and labs (CBC/D, CMP, vitamin D, B12, MMA) to be scheduled for December 2024/January 2025 >> Office visit 1 week after labs/mammogram   All questions were answered. The patient knows to call the clinic with any problems, questions or  concerns.  Medical decision making: Moderate  Time spent on visit: I spent 20 minutes counseling the patient face to face. The total time spent in the appointment was 30 minutes and more than 50% was on counseling.   Harriett Rush, PA-C  03/31/2022 2:11 PM

## 2022-03-31 ENCOUNTER — Ambulatory Visit (HOSPITAL_COMMUNITY): Payer: Self-pay

## 2022-03-31 ENCOUNTER — Inpatient Hospital Stay: Payer: Medicare Other | Attending: Hematology | Admitting: Physician Assistant

## 2022-03-31 VITALS — BP 138/64 | HR 65 | Temp 98.2°F | Resp 18 | Ht 61.0 in | Wt 138.6 lb

## 2022-03-31 DIAGNOSIS — C50911 Malignant neoplasm of unspecified site of right female breast: Secondary | ICD-10-CM

## 2022-03-31 DIAGNOSIS — M8580 Other specified disorders of bone density and structure, unspecified site: Secondary | ICD-10-CM | POA: Diagnosis not present

## 2022-03-31 DIAGNOSIS — Z79811 Long term (current) use of aromatase inhibitors: Secondary | ICD-10-CM | POA: Insufficient documentation

## 2022-03-31 DIAGNOSIS — Z79899 Other long term (current) drug therapy: Secondary | ICD-10-CM | POA: Diagnosis not present

## 2022-03-31 DIAGNOSIS — C50212 Malignant neoplasm of upper-inner quadrant of left female breast: Secondary | ICD-10-CM | POA: Insufficient documentation

## 2022-03-31 DIAGNOSIS — C50411 Malignant neoplasm of upper-outer quadrant of right female breast: Secondary | ICD-10-CM | POA: Insufficient documentation

## 2022-03-31 DIAGNOSIS — E538 Deficiency of other specified B group vitamins: Secondary | ICD-10-CM | POA: Insufficient documentation

## 2022-03-31 DIAGNOSIS — Z923 Personal history of irradiation: Secondary | ICD-10-CM | POA: Diagnosis not present

## 2022-03-31 DIAGNOSIS — Z170 Estrogen receptor positive status [ER+]: Secondary | ICD-10-CM | POA: Diagnosis not present

## 2022-03-31 NOTE — Patient Instructions (Addendum)
[facility] **   You were seen today by Tarri Abernethy PA-C for your history of breast cancer.    HISTORY OF BREAST CANCER Your breast exam today did not show any signs of breast cancer. Your most recent labs do not show any signs of recurrent breast cancer. You are scheduled for upcoming mammogram on 04/04/2022. Continue taking your anastrozole (Arimidex) once daily to help prevent recurrent breast cancer. You will be due for labs, mammogram, and another office visit in 1 year.  OSTEOPENIA Your breast cancer medication (anastrozole) can weaken your bones. We will check a bone density scan and discuss further with you once we have those results.  B12 DEFICIENCY: Continue monthly B12 injections at the [facility].  ** Thank you for trusting me with your healthcare!  I strive to provide all of my patients with quality care at each visit.  If you receive a survey for this visit, I would be so grateful to you for taking the time to provide feedback.  Thank you in advance!  ~ Christophr Calix                   Dr. Derek Jack   &   Tarri Abernethy, PA-C   - - - - - - - - - - - - - - - - - -    Thank you for choosing [facility] to provide your oncology and hematology care.  To afford each patient quality time with our provider, please arrive at least 15 minutes before your scheduled appointment time.   If you have a lab appointment with the [facility] please come in thru the Main Entrance and check in at the main information desk.  You need to re-schedule your appointment should you arrive 10 or more minutes late.  We strive to give you quality time with our providers, and arriving late affects you and other patients whose appointments are after yours.  Also, if you no show three or more times for appointments you may be dismissed from the clinic at the providers  discretion.     Again, thank you for choosing [facility].  Our hope is that these requests will decrease the amount of time that you wait before being seen by our physicians.       _____________________________________________________________  Should you have questions after your visit to [facility], please contact our office at [phone] and follow the prompts.  Our office hours are 8:00 a.m. and 4:30 p.m. Monday - Friday.  Please note that voicemails left after 4:00 p.m. may not be returned until the following business day.  We are closed weekends and major holidays.  You do have access to a nurse 24-7, just call the main number to the clinic [phone] and do not press any options, hold on the line and a nurse will answer the phone.    For prescription refill requests, have your pharmacy contact our office and allow 72 hours.

## 2022-04-04 ENCOUNTER — Ambulatory Visit (HOSPITAL_COMMUNITY)
Admission: RE | Admit: 2022-04-04 | Discharge: 2022-04-04 | Disposition: A | Discharge status: Home / Self Care | Payer: Medicare Other | Source: Ambulatory Visit | Attending: Physician Assistant | Admitting: Physician Assistant

## 2022-04-04 ENCOUNTER — Ambulatory Visit (HOSPITAL_COMMUNITY)
Admission: RE | Admit: 2022-04-04 | Discharge: 2022-04-04 | Disposition: A | Discharge status: Home / Self Care | Payer: Medicare Other | Source: Ambulatory Visit | Attending: Hematology | Admitting: Hematology

## 2022-04-04 DIAGNOSIS — M8580 Other specified disorders of bone density and structure, unspecified site: Secondary | ICD-10-CM | POA: Diagnosis present

## 2022-04-04 DIAGNOSIS — Z1231 Encounter for screening mammogram for malignant neoplasm of breast: Secondary | ICD-10-CM | POA: Diagnosis not present

## 2022-04-04 DIAGNOSIS — Z170 Estrogen receptor positive status [ER+]: Secondary | ICD-10-CM | POA: Diagnosis present

## 2022-04-04 DIAGNOSIS — Z780 Asymptomatic menopausal state: Secondary | ICD-10-CM | POA: Diagnosis not present

## 2022-04-04 DIAGNOSIS — C50911 Malignant neoplasm of unspecified site of right female breast: Secondary | ICD-10-CM

## 2022-04-07 ENCOUNTER — Telehealth: Payer: Self-pay

## 2022-04-07 NOTE — Telephone Encounter (Signed)
Spoke with the patient and stated she is taking her supplements as directed.  No other questions asked by the patient.

## 2022-04-07 NOTE — Telephone Encounter (Signed)
-----   Message from Harriett Rush, [place] sent at 04/04/2022  3:32 PM EST ----- Shelia Magallon: Please call patient to let her know that her bone density scan does not show any significant worsening of her osteopenia compared to her last scan in 2019.  She should continue to take vitamin D and calcium supplements.  No other changes to her plan at this time.

## 2022-04-14 ENCOUNTER — Other Ambulatory Visit: Payer: Self-pay

## 2022-04-14 DIAGNOSIS — C50911 Malignant neoplasm of unspecified site of right female breast: Secondary | ICD-10-CM

## 2022-04-14 DIAGNOSIS — Z79811 Long term (current) use of aromatase inhibitors: Secondary | ICD-10-CM

## 2022-04-14 DIAGNOSIS — M8580 Other specified disorders of bone density and structure, unspecified site: Secondary | ICD-10-CM

## 2022-04-15 ENCOUNTER — Other Ambulatory Visit: Payer: Self-pay | Admitting: Physician Assistant

## 2022-04-15 ENCOUNTER — Telehealth: Payer: Self-pay | Admitting: Cardiology

## 2022-04-15 DIAGNOSIS — C50911 Malignant neoplasm of unspecified site of right female breast: Secondary | ICD-10-CM

## 2022-04-15 MED ORDER — DILTIAZEM HCL ER COATED BEADS 120 MG PO CP24: ORAL_CAPSULE | ORAL | 0 refills | Status: DC

## 2022-04-15 NOTE — Telephone Encounter (Signed)
Refill complete 

## 2022-04-15 NOTE — Telephone Encounter (Signed)
*  STAT* If patient is at the pharmacy, call can be transferred to refill team.   1. Which medications need to be refilled? (please list name of each medication and dose if known)   diltiazem (CARDIZEM CD) 120 MG 24 hr capsule   2. Which pharmacy/location (including street and city if local pharmacy) is medication to be sent to?  [facility], [address]   3. Do they need a 30 day or 90 day supply? 90 day  Patient stated she only has a couple of capsules left.

## 2022-04-16 ENCOUNTER — Other Ambulatory Visit: Payer: Medicare Other

## 2022-04-17 ENCOUNTER — Ambulatory Visit: Payer: Medicare Other | Attending: Cardiology | Admitting: *Deleted

## 2022-04-17 DIAGNOSIS — Z5181 Encounter for therapeutic drug level monitoring: Secondary | ICD-10-CM

## 2022-04-17 DIAGNOSIS — I4891 Unspecified atrial fibrillation: Secondary | ICD-10-CM | POA: Diagnosis not present

## 2022-04-17 LAB — POCT INR: POC INR: 3

## 2022-04-17 NOTE — Patient Instructions (Signed)
Description   Continue same dose 1/2 tablet daily except 1 tablet on Mondays and Fridays Eat greens or salad today  Recheck in 5 weeks.

## 2022-04-23 ENCOUNTER — Inpatient Hospital Stay: Payer: Medicare Other

## 2022-04-23 ENCOUNTER — Ambulatory Visit: Payer: Medicare Other | Admitting: Hematology

## 2022-04-23 ENCOUNTER — Ambulatory Visit: Payer: Medicare Other

## 2022-04-24 ENCOUNTER — Ambulatory Visit: Payer: Medicare Other

## 2022-04-24 ENCOUNTER — Encounter: Payer: Self-pay | Admitting: *Deleted

## 2022-04-24 ENCOUNTER — Ambulatory Visit: Payer: Medicare Other | Attending: Cardiology | Admitting: Cardiology

## 2022-04-24 ENCOUNTER — Encounter: Payer: Self-pay | Admitting: Cardiology

## 2022-04-24 VITALS — BP 127/76 | HR 56 | Ht 61.0 in | Wt 140.0 lb

## 2022-04-24 DIAGNOSIS — E782 Mixed hyperlipidemia: Secondary | ICD-10-CM | POA: Diagnosis not present

## 2022-04-24 DIAGNOSIS — I10 Essential (primary) hypertension: Secondary | ICD-10-CM | POA: Diagnosis not present

## 2022-04-24 DIAGNOSIS — I480 Paroxysmal atrial fibrillation: Secondary | ICD-10-CM

## 2022-04-24 DIAGNOSIS — D6869 Other thrombophilia: Secondary | ICD-10-CM | POA: Diagnosis not present

## 2022-04-24 MED ORDER — ATENOLOL 100 MG PO TABS: 100.0000 mg | ORAL_TABLET | Freq: Every day | ORAL | 3 refills | Status: DC

## 2022-04-24 MED ORDER — DILTIAZEM HCL ER COATED BEADS 120 MG PO CP24: ORAL_CAPSULE | ORAL | 3 refills | Status: DC

## 2022-04-24 MED ORDER — SIMVASTATIN 10 MG PO TABS: 10.0000 mg | ORAL_TABLET | Freq: Every day | ORAL | 3 refills | Status: DC

## 2022-04-24 NOTE — Progress Notes (Signed)
Clinical Summary Ms. Massiah is a 81 y.o.female seen today for follow up of the following medical problem.s    1. Afib  - not interested in NOACs, remains on coumadin.      - some recent palpitations. 1-2 times per months, lasts about 1 month - no bleeding on coumadin   2. HTN - compliant with meds   3. Hyperlipidemia - llabs followed by pcp, she is on simvastatin   4. 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 15 capsule 0   anastrozole (ARIMIDEX) 1 MG tablet TAKE ONE TABLET BY MOUTH ONCE DAILY. 90 tablet 1   atenolol (TENORMIN) 100 MG tablet Take 1 tablet (100 mg total) by mouth daily. 90 tablet 3   benzonatate (TESSALON) 100 MG capsule Take 1 capsule (100 mg total) by mouth every 8 (eight) hours. 21 capsule 0   calcium carbonate (OS-CAL) 600 MG TABS tablet Take 600 mg by mouth 2 (two) times daily with a meal.      cetirizine (ZYRTEC) 10 MG tablet Take 10 mg by mouth every morning.     Cholecalciferol (VITAMIN D) 400 UNITS capsule Take 400 Units by mouth 2 (two) times daily.     Cyanocobalamin 2000 MCG/ML SOLN Inject 1 mL as directed. Patient injects once a month.  Is not sure of dose.     denosumab (PROLIA) 60 MG/ML SOLN injection Inject 60 mg into the skin every 6 (six) months. Administer in upper arm, thigh, or abdomen     diltiazem (CARDIZEM CD) 120 MG 24  hr capsule TAKE (1) CAPSULE BY MOUTH ONCE A DAY. 30 capsule 0   doxycycline (VIBRAMYCIN) 100 MG capsule Take 1 capsule (100 mg total) by mouth 2 (two) times daily. One po bid x 7 days 10 capsule 0   fluticasone (FLONASE) 50 MCG/ACT nasal spray Place 2 sprays into both nostrils at bedtime.     LORazepam (ATIVAN) 1 MG tablet Take 1 mg by mouth at bedtime.     metoCLOPramide (REGLAN) 10 MG tablet TAKE 1 TABLET BY MOUTH EVERY 8 HOURS AS NEEDED FOR NAUSEA. 120 tablet 0   pantoprazole (PROTONIX) 40 MG tablet TAKE (1) TABLET BY MOUTH TWICE DAILY. 60 tablet 5   psyllium (METAMUCIL) 58.6 % powder Take 1 packet by mouth daily.      simvastatin (ZOCOR) 10 MG tablet Take 10 mg by mouth at bedtime.     warfarin (COUMADIN) 2.5 MG tablet TAKE 1/2 TABLET BY MOUTH DAILY EXCEPT 1 TABLET ON MONDAY AND FRIDAYS. 30 tablet 5   No current facility-administered medications for this visit.     Past Surgical History:  Procedure Laterality Date   ABDOMINAL HYSTERECTOMY     AXILLARY LYMPH NODE DISSECTION Left 02/23/2013   Procedure: LEFT AXILLARY LYMPH NODE  DISSECTION;  Surgeon: Scherry Ran, MD;  Location: [facility];  Service: General;  Laterality: Left;   CHOLECYSTECTOMY     COLONOSCOPY  03/10/2002   RMR: Incomplete colonoscopy (sigmoidoscopy)/Normal rectum/Normal colon to 40 cm.    ESOPHAGOGASTRODUODENOSCOPY  03/10/2002   YKZ:LDJTTS esophagus/small HH/Tiny AVM in antrum, otherwise normal stomach and D1 and D2/Status post passage of the [facility] dilator   PARTIAL MASTECTOMY WITH AXILLARY SENTINEL LYMPH NODE BIOPSY Left 02/23/2013   Procedure: LEFT PARTIAL MASTECTOMY WITH AXILLARY SIMPLE NODE BIOPSY, LEFT BREAST WIDE EXCISION;  Surgeon: Scherry Ran, MD;  Location: [facility];  Service: General;  Laterality: Left;   PARTIAL MASTECTOMY WITH NEEDLE LOCALIZATION Right 04/02/2016   Procedure: RIGHT PARTIAL MASTECTOMY AFTER NEEDLE LOCALIZATION;  Surgeon: Aviva Signs, MD;  Location: [facility];  Service: General;   Laterality: Right;   PORT-A-CATH REMOVAL Right 07/31/2017   Procedure: MINOR REMOVAL PORT-A-CATH;  Surgeon: Aviva Signs, MD;  Location: [facility];  Service: General;  Laterality: Right;   PORTACATH PLACEMENT Right 04/07/2013   PORTACATH PLACEMENT Right 04/07/2013   Procedure: INSERTION PORT-A-CATH;  Surgeon: Scherry Ran, MD;  Location: [facility];  Service: General;  Laterality: Right;     Allergies  Allergen Reactions   Iohexol Hives   Sulfa Antibiotics Rash   Sulfasalazine Rash      Family History  Problem Relation Age of Onset   Heart disease Mother        Also 43 of 9 siblings   Heart attack Father    Leukemia Other    Leukemia Other    Colon cancer Neg Hx      Social History Ms. Fuster reports that she has never smoked. She has never used smokeless tobacco. Ms. Lantis reports no history of alcohol use.   Review of Systems CONSTITUTIONAL: No weight loss, fever, chills, weakness or fatigue.  HEENT: Eyes: No visual loss, blurred vision, double vision or yellow sclerae.No hearing loss, sneezing, congestion, runny nose or sore throat.  SKIN: No rash or itching.  CARDIOVASCULAR: per hpi RESPIRATORY: No shortness of breath, cough or sputum.  GASTROINTESTINAL: No anorexia, nausea, vomiting or diarrhea. No abdominal pain or blood.  GENITOURINARY: No burning on urination, no polyuria NEUROLOGICAL: No headache, dizziness, syncope, paralysis, ataxia, numbness or tingling in the extremities. No change in bowel or bladder control.  MUSCULOSKELETAL: No muscle, back pain, joint pain or stiffness.  LYMPHATICS: No enlarged nodes. No history of splenectomy.  PSYCHIATRIC: No history of depression or anxiety.  ENDOCRINOLOGIC: No reports of sweating, cold or heat intolerance. No polyuria or polydipsia.  Marland Kitchen   Physical Examination Today's Vitals   04/24/22 1505  BP: 127/76  Pulse: (!) 56  SpO2: 96%  Weight: 140 lb (63.5 kg)  Height: '5\' 1"'$  (1.549 m)  PainSc: 0-No pain   Body mass  index is 26.45 kg/m.  Gen: resting comfortably, no acute distress HEENT: no scleral icterus, pupils equal round and reactive, no palptable cervical adenopathy,  CV: irreg, no m/r/g no jvd Resp: Clear to auscultation bilaterally GI: abdomen is soft, non-tender, non-distended, normal bowel sounds, no hepatosplenomegaly MSK: extremities are warm, no edema.  Skin: warm, no rash Neuro:  no focal deficits Psych: appropriate affect   Diagnostic Studies     Assessment and Plan   1. Afib/acquired thrombophilia - has not been interested in NOACs, continue coumadin - infrequent mild palpitations, continue current meds EKG today shows rate controlled afib   2. HTN - she is at goal, continue current meds  3. Hyperlipidemia - continue current meds, request pcp labs     Arnoldo Lenis, M.D.

## 2022-04-24 NOTE — Patient Instructions (Addendum)

## 2022-04-25 ENCOUNTER — Inpatient Hospital Stay: Payer: Medicare Other

## 2022-04-25 VITALS — BP 130/64 | HR 65 | Temp 97.7°F | Resp 16

## 2022-04-25 DIAGNOSIS — C50212 Malignant neoplasm of upper-inner quadrant of left female breast: Secondary | ICD-10-CM | POA: Diagnosis not present

## 2022-04-25 DIAGNOSIS — C50911 Malignant neoplasm of unspecified site of right female breast: Secondary | ICD-10-CM

## 2022-04-25 DIAGNOSIS — M8580 Other specified disorders of bone density and structure, unspecified site: Secondary | ICD-10-CM

## 2022-04-25 DIAGNOSIS — Z79899 Other long term (current) drug therapy: Secondary | ICD-10-CM

## 2022-04-25 MED ORDER — CYANOCOBALAMIN 1000 MCG/ML IJ SOLN
1000.0000 ug | Freq: Once | INTRAMUSCULAR | Status: AC
  Administered 2022-04-25: 1000 ug via INTRAMUSCULAR
  Filled 2022-04-25: qty 1

## 2022-04-25 NOTE — Progress Notes (Signed)
Patient in clinic today for B12 injection. See MAR for injection instructions. Patient remained stable throughout injection. Patient discharged from clinic ambulatory and in stable condition.

## 2022-04-25 NOTE — Patient Instructions (Signed)
Stacie Huang  Discharge Instructions: Thank you for choosing [facility] to provide your oncology and hematology care.  If you have a lab appointment with the [facility], please come in thru the Main Entrance and check in at the main information desk.  Wear comfortable clothing and clothing appropriate for easy access to any Portacath or PICC line.   We strive to give you quality time with your provider. You may need to reschedule your appointment if you arrive late (15 or more minutes).  Arriving late affects you and other patients whose appointments are after yours.  Also, if you miss three or more appointments without notifying the office, you may be dismissed from the clinic at the provider's discretion.      For prescription refill requests, have your pharmacy contact our office and allow 72 hours for refills to be completed.    Today you received the B12 injection    To help prevent nausea and vomiting after your treatment, we encourage you to take your nausea medication as directed.  BELOW ARE SYMPTOMS THAT SHOULD BE REPORTED IMMEDIATELY: *FEVER GREATER THAN 100.4 F (38 C) OR HIGHER *CHILLS OR SWEATING *NAUSEA AND VOMITING THAT IS NOT CONTROLLED WITH YOUR NAUSEA MEDICATION *UNUSUAL SHORTNESS OF BREATH *UNUSUAL BRUISING OR BLEEDING *URINARY PROBLEMS (pain or burning when urinating, or frequent urination) *BOWEL PROBLEMS (unusual diarrhea, constipation, pain near the anus) TENDERNESS IN MOUTH AND THROAT WITH OR WITHOUT PRESENCE OF ULCERS (sore throat, sores in mouth, or a toothache) UNUSUAL RASH, SWELLING OR PAIN  UNUSUAL VAGINAL DISCHARGE OR ITCHING   Items with * indicate a potential emergency and should be followed up as soon as possible or go to the Emergency Department if any problems should occur.  Please show the CHEMOTHERAPY ALERT CARD or IMMUNOTHERAPY ALERT CARD at check-in to the Emergency Department and triage nurse.  Should you  have questions after your visit or need to cancel or reschedule your appointment, please contact [facility] [phone]  and follow the prompts.  Office hours are 8:00 a.m. to 4:30 p.m. Monday - Friday. Please note that voicemails left after 4:00 p.m. may not be returned until the following business day.  We are closed weekends and major holidays. You have access to a nurse at all times for urgent questions. Please call the main number to the clinic [phone] and follow the prompts.  For any non-urgent questions, you may also contact your provider using MyChart. We now offer e-Visits for anyone 53 and older to request care online for non-urgent symptoms. For details visit [email or web address].   Also download the MyChart app! Go to the app store, search "MyChart", open the app, select [facility], and log in with your MyChart username and password.

## 2022-04-29 ENCOUNTER — Other Ambulatory Visit: Payer: Self-pay | Admitting: *Deleted

## 2022-04-29 DIAGNOSIS — C50911 Malignant neoplasm of unspecified site of right female breast: Secondary | ICD-10-CM

## 2022-04-29 MED ORDER — ANASTROZOLE 1 MG PO TABS: 1.0000 mg | ORAL_TABLET | Freq: Every day | ORAL | 3 refills | Status: DC

## 2022-05-05 ENCOUNTER — Other Ambulatory Visit (HOSPITAL_COMMUNITY): Payer: Medicare Other

## 2022-05-15 ENCOUNTER — Encounter (HOSPITAL_COMMUNITY): Payer: Self-pay | Admitting: Hematology

## 2022-05-22 ENCOUNTER — Ambulatory Visit: Payer: 59 | Attending: Cardiology | Admitting: *Deleted

## 2022-05-22 DIAGNOSIS — I4891 Unspecified atrial fibrillation: Secondary | ICD-10-CM | POA: Diagnosis not present

## 2022-05-22 DIAGNOSIS — Z5181 Encounter for therapeutic drug level monitoring: Secondary | ICD-10-CM | POA: Diagnosis not present

## 2022-05-22 DIAGNOSIS — I480 Paroxysmal atrial fibrillation: Secondary | ICD-10-CM

## 2022-05-22 LAB — POCT INR: INR: 3 (ref 2.0–3.0)

## 2022-05-22 MED ORDER — WARFARIN SODIUM 2.5 MG PO TABS: ORAL_TABLET | ORAL | 5 refills | Status: DC

## 2022-05-22 NOTE — Patient Instructions (Signed)
Continue same dose 1/2 tablet daily except 1 tablet on Mondays and Fridays Eat greens or salad today  Recheck in 6 weeks.

## 2022-05-26 ENCOUNTER — Inpatient Hospital Stay: Payer: 59 | Attending: Hematology

## 2022-05-26 VITALS — BP 111/74 | HR 85 | Temp 98.6°F | Resp 18

## 2022-05-26 DIAGNOSIS — M8580 Other specified disorders of bone density and structure, unspecified site: Secondary | ICD-10-CM

## 2022-05-26 DIAGNOSIS — E538 Deficiency of other specified B group vitamins: Secondary | ICD-10-CM | POA: Diagnosis present

## 2022-05-26 DIAGNOSIS — Z79899 Other long term (current) drug therapy: Secondary | ICD-10-CM

## 2022-05-26 DIAGNOSIS — C50911 Malignant neoplasm of unspecified site of right female breast: Secondary | ICD-10-CM

## 2022-05-26 MED ORDER — CYANOCOBALAMIN 1000 MCG/ML IJ SOLN
1000.0000 ug | Freq: Once | INTRAMUSCULAR | Status: AC
  Administered 2022-05-26: 1000 ug via INTRAMUSCULAR
  Filled 2022-05-26: qty 1

## 2022-05-26 NOTE — Progress Notes (Signed)
Stacie Huang presents today for injection per the provider's orders.  B12 administration without incident; injection site WNL; see MAR for injection details.  Patient tolerated procedure well and without incident.  No questions or complaints noted at this time.

## 2022-05-26 NOTE — Patient Instructions (Signed)
[facility]  Discharge Instructions: Thank you for choosing [facility] to provide your oncology and hematology care.  If you have a lab appointment with the Cancer Center, please come in thru the Main Entrance and check in at the main information desk.  Wear comfortable clothing and clothing appropriate for easy access to any Portacath or PICC line.   We strive to give you quality time with your provider. You may need to reschedule your appointment if you arrive late (15 or more minutes).  Arriving late affects you and other patients whose appointments are after yours.  Also, if you miss three or more appointments without notifying the office, you may be dismissed from the clinic at the provider's discretion.      For prescription refill requests, have your pharmacy contact our office and allow 72 hours for refills to be completed.    Today you received the following chemotherapy and/or immunotherapy agents B12      To help prevent nausea and vomiting after your treatment, we encourage you to take your nausea medication as directed.  BELOW ARE SYMPTOMS THAT SHOULD BE REPORTED IMMEDIATELY: *FEVER GREATER THAN 100.4 F (38 C) OR HIGHER *CHILLS OR SWEATING *NAUSEA AND VOMITING THAT IS NOT CONTROLLED WITH YOUR NAUSEA MEDICATION *UNUSUAL SHORTNESS OF BREATH *UNUSUAL BRUISING OR BLEEDING *URINARY PROBLEMS (pain or burning when urinating, or frequent urination) *BOWEL PROBLEMS (unusual diarrhea, constipation, pain near the anus) TENDERNESS IN MOUTH AND THROAT WITH OR WITHOUT PRESENCE OF ULCERS (sore throat, sores in mouth, or a toothache) UNUSUAL RASH, SWELLING OR PAIN  UNUSUAL VAGINAL DISCHARGE OR ITCHING   Items with * indicate a potential emergency and should be followed up as soon as possible or go to the Emergency Department if any problems should occur.  Please show the CHEMOTHERAPY ALERT CARD or IMMUNOTHERAPY ALERT CARD at check-in to the Emergency  Department and triage nurse.  Should you have questions after your visit or need to cancel or reschedule your appointment, please contact [facility] [phone]  and follow the prompts.  Office hours are 8:00 a.m. to 4:30 p.m. Monday - Friday. Please note that voicemails left after 4:00 p.m. may not be returned until the following business day.  We are closed weekends and major holidays. You have access to a nurse at all times for urgent questions. Please call the main number to the clinic [phone] and follow the prompts.  For any non-urgent questions, you may also contact your provider using MyChart. We now offer e-Visits for anyone 18 and older to request care online for non-urgent symptoms. For details visit [email or web address].   Also download the MyChart app! Go to the app store, search "MyChart", open the app, select [facility], and log in with your MyChart username and password.   

## 2022-05-28 ENCOUNTER — Other Ambulatory Visit: Payer: Self-pay | Admitting: *Deleted

## 2022-05-28 DIAGNOSIS — Z79811 Long term (current) use of aromatase inhibitors: Secondary | ICD-10-CM

## 2022-05-28 DIAGNOSIS — R110 Nausea: Secondary | ICD-10-CM

## 2022-05-28 MED ORDER — METOCLOPRAMIDE HCL 10 MG PO TABS: 10.0000 mg | ORAL_TABLET | Freq: Three times a day (TID) | ORAL | 0 refills | Status: DC | PRN

## 2022-06-20 ENCOUNTER — Telehealth: Payer: Self-pay | Admitting: Cardiology

## 2022-06-20 NOTE — Telephone Encounter (Signed)
Patient is calling to speak to you. Please advise

## 2022-06-20 NOTE — Telephone Encounter (Signed)
Returned pt's call. Pt stated her PCP prescribed her Prednisone '10mg'$  daily x 2 weeks. Made pt aware that Prednisone '10mg'$  daily will not affect INR. Advised pt to continue taking Warfarin and Prednisone as directed. Pt verbalized understanding.

## 2022-06-26 ENCOUNTER — Inpatient Hospital Stay: Payer: 59 | Attending: Hematology

## 2022-06-26 VITALS — BP 117/64 | HR 68 | Temp 98.4°F | Resp 16

## 2022-06-26 DIAGNOSIS — C50911 Malignant neoplasm of unspecified site of right female breast: Secondary | ICD-10-CM

## 2022-06-26 DIAGNOSIS — E538 Deficiency of other specified B group vitamins: Secondary | ICD-10-CM | POA: Diagnosis present

## 2022-06-26 DIAGNOSIS — Z79899 Other long term (current) drug therapy: Secondary | ICD-10-CM

## 2022-06-26 DIAGNOSIS — M8580 Other specified disorders of bone density and structure, unspecified site: Secondary | ICD-10-CM

## 2022-06-26 MED ORDER — CYANOCOBALAMIN 1000 MCG/ML IJ SOLN
1000.0000 ug | Freq: Once | INTRAMUSCULAR | Status: AC
  Administered 2022-06-26: 1000 ug via INTRAMUSCULAR
  Filled 2022-06-26: qty 1

## 2022-06-26 NOTE — Progress Notes (Signed)
Patient tolerated B12 injection with no complaints voiced. Site clean and dry with no bruising or swelling noted at site. See MAR for details. Band aid applied.  Patient stable during and after injection. VSS with discharge and left in satisfactory condition with no s/s of distress noted. 

## 2022-06-26 NOTE — Patient Instructions (Signed)
[facility]  Discharge Instructions: Thank you for choosing [facility] to provide your oncology and hematology care.  If you have a lab appointment with the Cancer Center, please come in thru the Main Entrance and check in at the main information desk.  Wear comfortable clothing and clothing appropriate for easy access to any Portacath or PICC line.   We strive to give you quality time with your provider. You may need to reschedule your appointment if you arrive late (15 or more minutes).  Arriving late affects you and other patients whose appointments are after yours.  Also, if you miss three or more appointments without notifying the office, you may be dismissed from the clinic at the provider's discretion.      For prescription refill requests, have your pharmacy contact our office and allow 72 hours for refills to be completed.    Today you received the following B12 injection, return as scheduled.   To help prevent nausea and vomiting after your treatment, we encourage you to take your nausea medication as directed.  BELOW ARE SYMPTOMS THAT SHOULD BE REPORTED IMMEDIATELY: *FEVER GREATER THAN 100.4 F (38 C) OR HIGHER *CHILLS OR SWEATING *NAUSEA AND VOMITING THAT IS NOT CONTROLLED WITH YOUR NAUSEA MEDICATION *UNUSUAL SHORTNESS OF BREATH *UNUSUAL BRUISING OR BLEEDING *URINARY PROBLEMS (pain or burning when urinating, or frequent urination) *BOWEL PROBLEMS (unusual diarrhea, constipation, pain near the anus) TENDERNESS IN MOUTH AND THROAT WITH OR WITHOUT PRESENCE OF ULCERS (sore throat, sores in mouth, or a toothache) UNUSUAL RASH, SWELLING OR PAIN  UNUSUAL VAGINAL DISCHARGE OR ITCHING   Items with * indicate a potential emergency and should be followed up as soon as possible or go to the Emergency Department if any problems should occur.  Please show the CHEMOTHERAPY ALERT CARD or IMMUNOTHERAPY ALERT CARD at check-in to the Emergency Department and  triage nurse.  Should you have questions after your visit or need to cancel or reschedule your appointment, please contact [facility] [phone]  and follow the prompts.  Office hours are 8:00 a.m. to 4:30 p.m. Monday - Friday. Please note that voicemails left after 4:00 p.m. may not be returned until the following business day.  We are closed weekends and major holidays. You have access to a nurse at all times for urgent questions. Please call the main number to the clinic [phone] and follow the prompts.  For any non-urgent questions, you may also contact your provider using MyChart. We now offer e-Visits for anyone 18 and older to request care online for non-urgent symptoms. For details visit [email or web address].   Also download the MyChart app! Go to the app store, search "MyChart", open the app, select [facility], and log in with your MyChart username and password.   

## 2022-07-03 ENCOUNTER — Ambulatory Visit: Payer: 59 | Attending: Cardiology | Admitting: *Deleted

## 2022-07-03 DIAGNOSIS — Z5181 Encounter for therapeutic drug level monitoring: Secondary | ICD-10-CM | POA: Diagnosis not present

## 2022-07-03 DIAGNOSIS — I480 Paroxysmal atrial fibrillation: Secondary | ICD-10-CM

## 2022-07-03 DIAGNOSIS — I4891 Unspecified atrial fibrillation: Secondary | ICD-10-CM | POA: Diagnosis not present

## 2022-07-03 LAB — POCT INR: INR: 4.5 — AB (ref 2.0–3.0)

## 2022-07-03 MED ORDER — WARFARIN SODIUM 2.5 MG PO TABS: ORAL_TABLET | ORAL | 5 refills | Status: DC

## 2022-07-03 NOTE — Patient Instructions (Signed)
On prednisone '10mg'$  daily x 11 more days for allergies Hold warfarin tonight then decrease dose to 1/2 tablet daily while on Prednisone Eat greens or salad today  Recheck in 2 weeks.

## 2022-07-15 ENCOUNTER — Ambulatory Visit: Payer: 59 | Attending: Cardiology | Admitting: *Deleted

## 2022-07-15 DIAGNOSIS — Z5181 Encounter for therapeutic drug level monitoring: Secondary | ICD-10-CM | POA: Diagnosis not present

## 2022-07-15 DIAGNOSIS — I4891 Unspecified atrial fibrillation: Secondary | ICD-10-CM

## 2022-07-15 LAB — POCT INR: INR: 2.8 (ref 2.0–3.0)

## 2022-07-15 NOTE — Patient Instructions (Signed)
On prednisone 10mg  daily x 4 more days for allergies Continue warfarin 1/2 tablet daily while on Prednisone then start 1/2 tablet daily except 1 tablet on Mondays Eat greens or salad today  Recheck in 3 weeks.

## 2022-07-24 ENCOUNTER — Inpatient Hospital Stay: Payer: 59 | Attending: Hematology

## 2022-07-24 ENCOUNTER — Other Ambulatory Visit: Payer: Self-pay

## 2022-07-24 VITALS — BP 111/68 | HR 85 | Temp 98.9°F | Resp 18

## 2022-07-24 DIAGNOSIS — R110 Nausea: Secondary | ICD-10-CM

## 2022-07-24 DIAGNOSIS — E538 Deficiency of other specified B group vitamins: Secondary | ICD-10-CM | POA: Insufficient documentation

## 2022-07-24 DIAGNOSIS — Z79899 Other long term (current) drug therapy: Secondary | ICD-10-CM

## 2022-07-24 DIAGNOSIS — C50911 Malignant neoplasm of unspecified site of right female breast: Secondary | ICD-10-CM

## 2022-07-24 DIAGNOSIS — Z79811 Long term (current) use of aromatase inhibitors: Secondary | ICD-10-CM

## 2022-07-24 DIAGNOSIS — M8580 Other specified disorders of bone density and structure, unspecified site: Secondary | ICD-10-CM

## 2022-07-24 MED ORDER — CYANOCOBALAMIN 1000 MCG/ML IJ SOLN
1000.0000 ug | Freq: Once | INTRAMUSCULAR | Status: AC
  Administered 2022-07-24: 1000 ug via INTRAMUSCULAR
  Filled 2022-07-24: qty 1

## 2022-07-24 MED ORDER — METOCLOPRAMIDE HCL 10 MG PO TABS: 10.0000 mg | ORAL_TABLET | Freq: Three times a day (TID) | ORAL | 0 refills | Status: DC | PRN

## 2022-07-24 NOTE — Progress Notes (Signed)
Patient tolerated injection with no complaints voiced. Site clean and dry with no bruising or swelling noted at site. See MAR for details. Band aid applied.  Patient stable during and after injection. VSS with discharge and left in satisfactory condition with no s/s of distress noted.  

## 2022-07-24 NOTE — Patient Instructions (Signed)
[facility]  Discharge Instructions: Thank you for choosing [facility] to provide your oncology and hematology care.  If you have a lab appointment with the Cancer Center, please come in thru the Main Entrance and check in at the main information desk.  Wear comfortable clothing and clothing appropriate for easy access to any Portacath or PICC line.   We strive to give you quality time with your provider. You may need to reschedule your appointment if you arrive late (15 or more minutes).  Arriving late affects you and other patients whose appointments are after yours.  Also, if you miss three or more appointments without notifying the office, you may be dismissed from the clinic at the provider's discretion.      For prescription refill requests, have your pharmacy contact our office and allow 72 hours for refills to be completed.    Today you received the following  B12 injection, return as scheduled.   To help prevent nausea and vomiting after your treatment, we encourage you to take your nausea medication as directed.  BELOW ARE SYMPTOMS THAT SHOULD BE REPORTED IMMEDIATELY: *FEVER GREATER THAN 100.4 F (38 C) OR HIGHER *CHILLS OR SWEATING *NAUSEA AND VOMITING THAT IS NOT CONTROLLED WITH YOUR NAUSEA MEDICATION *UNUSUAL SHORTNESS OF BREATH *UNUSUAL BRUISING OR BLEEDING *URINARY PROBLEMS (pain or burning when urinating, or frequent urination) *BOWEL PROBLEMS (unusual diarrhea, constipation, pain near the anus) TENDERNESS IN MOUTH AND THROAT WITH OR WITHOUT PRESENCE OF ULCERS (sore throat, sores in mouth, or a toothache) UNUSUAL RASH, SWELLING OR PAIN  UNUSUAL VAGINAL DISCHARGE OR ITCHING   Items with * indicate a potential emergency and should be followed up as soon as possible or go to the Emergency Department if any problems should occur.  Please show the CHEMOTHERAPY ALERT CARD or IMMUNOTHERAPY ALERT CARD at check-in to the Emergency Department and  triage nurse.  Should you have questions after your visit or need to cancel or reschedule your appointment, please contact [facility] [phone]  and follow the prompts.  Office hours are 8:00 a.m. to 4:30 p.m. Monday - Friday. Please note that voicemails left after 4:00 p.m. may not be returned until the following business day.  We are closed weekends and major holidays. You have access to a nurse at all times for urgent questions. Please call the main number to the clinic [phone] and follow the prompts.  For any non-urgent questions, you may also contact your provider using MyChart. We now offer e-Visits for anyone 18 and older to request care online for non-urgent symptoms. For details visit [email or web address].   Also download the MyChart app! Go to the app store, search "MyChart", open the app, select [facility], and log in with your MyChart username and password.   

## 2022-08-05 ENCOUNTER — Ambulatory Visit: Payer: 59 | Attending: Cardiology | Admitting: *Deleted

## 2022-08-05 DIAGNOSIS — I4891 Unspecified atrial fibrillation: Secondary | ICD-10-CM | POA: Diagnosis not present

## 2022-08-05 DIAGNOSIS — Z5181 Encounter for therapeutic drug level monitoring: Secondary | ICD-10-CM

## 2022-08-05 LAB — POCT INR: INR: 2.1 (ref 2.0–3.0)

## 2022-08-05 NOTE — Patient Instructions (Signed)
Continue warfarin 1/2 tablet daily except 1 tablet on Mondays Eat greens or salad today  Recheck in 4  weeks.

## 2022-08-22 ENCOUNTER — Other Ambulatory Visit: Payer: Self-pay

## 2022-08-22 DIAGNOSIS — C50911 Malignant neoplasm of unspecified site of right female breast: Secondary | ICD-10-CM

## 2022-08-25 ENCOUNTER — Inpatient Hospital Stay: Payer: 59 | Attending: Hematology

## 2022-08-25 ENCOUNTER — Inpatient Hospital Stay: Payer: 59

## 2022-08-25 VITALS — BP 118/61 | HR 84 | Temp 99.0°F | Resp 18

## 2022-08-25 DIAGNOSIS — M8580 Other specified disorders of bone density and structure, unspecified site: Secondary | ICD-10-CM | POA: Insufficient documentation

## 2022-08-25 DIAGNOSIS — E538 Deficiency of other specified B group vitamins: Secondary | ICD-10-CM | POA: Insufficient documentation

## 2022-08-25 DIAGNOSIS — Z79899 Other long term (current) drug therapy: Secondary | ICD-10-CM

## 2022-08-25 DIAGNOSIS — C50911 Malignant neoplasm of unspecified site of right female breast: Secondary | ICD-10-CM

## 2022-08-25 LAB — COMPREHENSIVE METABOLIC PANEL
ALT: 13 U/L (ref 0–44)
AST: 18 U/L (ref 15–41)
Albumin: 3.6 g/dL (ref 3.5–5.0)
Alkaline Phosphatase: 43 U/L (ref 38–126)
Anion gap: 9 (ref 5–15)
BUN: 16 mg/dL (ref 8–23)
CO2: 23 mmol/L (ref 22–32)
Calcium: 9.4 mg/dL (ref 8.9–10.3)
Chloride: 106 mmol/L (ref 98–111)
Creatinine, Ser: 1.15 mg/dL — ABNORMAL HIGH (ref 0.44–1.00)
GFR, Estimated: 48 mL/min — ABNORMAL LOW (ref >60)
Glucose, Bld: 101 mg/dL — ABNORMAL HIGH (ref 70–99)
Potassium: 3.6 mmol/L (ref 3.5–5.1)
Sodium: 138 mmol/L (ref 135–145)
Total Bilirubin: 0.9 mg/dL (ref 0.3–1.2)
Total Protein: 6.6 g/dL (ref 6.5–8.1)

## 2022-08-25 MED ORDER — CYANOCOBALAMIN 1000 MCG/ML IJ SOLN
1000.0000 ug | Freq: Once | INTRAMUSCULAR | Status: AC
  Administered 2022-08-25: 1000 ug via INTRAMUSCULAR
  Filled 2022-08-25: qty 1

## 2022-08-25 MED ORDER — DENOSUMAB 60 MG/ML ~~LOC~~ SOSY
60.0000 mg | PREFILLED_SYRINGE | Freq: Once | SUBCUTANEOUS | Status: AC
  Administered 2022-08-25: 60 mg via SUBCUTANEOUS
  Filled 2022-08-25: qty 1

## 2022-08-25 NOTE — Progress Notes (Signed)
Patient presents today for B12 injection and Prolia. Patient taking calcium as directed. Denied tooth, jaw, and leg pain. No recent or upcoming dental visits. Labs reviewed. Patient tolerated injections with no complaints voiced. See MAR for details. Patient stable during and after injection. Site clean and dry with no bruising or swelling noted. Band aid applied. Vss with discharge and left in satisfactory condition with no s/s of distress.

## 2022-08-25 NOTE — Patient Instructions (Signed)
[facility]  Discharge Instructions: Thank you for choosing [facility] to provide your oncology and hematology care.  If you have a lab appointment with the Cancer Center - please note that after April 8th, 2024, all labs will be drawn in the cancer center.  You do not have to check in or register with the main entrance as you have in the past but will complete your check-in in the cancer center.  Wear comfortable clothing and clothing appropriate for easy access to any Portacath or PICC line.   We strive to give you quality time with your provider. You may need to reschedule your appointment if you arrive late (15 or more minutes).  Arriving late affects you and other patients whose appointments are after yours.  Also, if you miss three or more appointments without notifying the office, you may be dismissed from the clinic at the provider's discretion.      For prescription refill requests, have your pharmacy contact our office and allow 72 hours for refills to be completed.    Today you received the following Prolia and B12 injection, return as scheduled.   To help prevent nausea and vomiting after your treatment, we encourage you to take your nausea medication as directed.  BELOW ARE SYMPTOMS THAT SHOULD BE REPORTED IMMEDIATELY: *FEVER GREATER THAN 100.4 F (38 C) OR HIGHER *CHILLS OR SWEATING *NAUSEA AND VOMITING THAT IS NOT CONTROLLED WITH YOUR NAUSEA MEDICATION *UNUSUAL SHORTNESS OF BREATH *UNUSUAL BRUISING OR BLEEDING *URINARY PROBLEMS (pain or burning when urinating, or frequent urination) *BOWEL PROBLEMS (unusual diarrhea, constipation, pain near the anus) TENDERNESS IN MOUTH AND THROAT WITH OR WITHOUT PRESENCE OF ULCERS (sore throat, sores in mouth, or a toothache) UNUSUAL RASH, SWELLING OR PAIN  UNUSUAL VAGINAL DISCHARGE OR ITCHING   Items with * indicate a potential emergency and should be followed up as soon as possible or go to the  Emergency Department if any problems should occur.  Please show the CHEMOTHERAPY ALERT CARD or IMMUNOTHERAPY ALERT CARD at check-in to the Emergency Department and triage nurse.  Should you have questions after your visit or need to cancel or reschedule your appointment, please contact [facility] [phone]  and follow the prompts.  Office hours are 8:00 a.m. to 4:30 p.m. Monday - Friday. Please note that voicemails left after 4:00 p.m. may not be returned until the following business day.  We are closed weekends and major holidays. You have access to a nurse at all times for urgent questions. Please call the main number to the clinic [phone] and follow the prompts.  For any non-urgent questions, you may also contact your provider using MyChart. We now offer e-Visits for anyone 67 and older to request care online for non-urgent symptoms. For details visit [email or web address].   Also download the MyChart app! Go to the app store, search "MyChart", open the app, select [facility], and log in with your MyChart username and password.

## 2022-09-02 ENCOUNTER — Ambulatory Visit: Payer: 59 | Attending: Cardiology | Admitting: *Deleted

## 2022-09-02 DIAGNOSIS — Z5181 Encounter for therapeutic drug level monitoring: Secondary | ICD-10-CM

## 2022-09-02 DIAGNOSIS — I4891 Unspecified atrial fibrillation: Secondary | ICD-10-CM

## 2022-09-02 LAB — POCT INR: INR: 2 (ref 2.0–3.0)

## 2022-09-02 NOTE — Patient Instructions (Signed)
Continue warfarin 1/2 tablet daily except 1 tablet on Mondays Eat greens or salad today  Recheck in 6 weeks.

## 2022-09-24 ENCOUNTER — Other Ambulatory Visit: Payer: Self-pay

## 2022-09-24 ENCOUNTER — Inpatient Hospital Stay: Payer: 59 | Attending: Hematology

## 2022-09-24 VITALS — BP 110/50 | HR 73 | Temp 98.1°F | Resp 16

## 2022-09-24 DIAGNOSIS — R110 Nausea: Secondary | ICD-10-CM

## 2022-09-24 DIAGNOSIS — M8580 Other specified disorders of bone density and structure, unspecified site: Secondary | ICD-10-CM

## 2022-09-24 DIAGNOSIS — Z79811 Long term (current) use of aromatase inhibitors: Secondary | ICD-10-CM

## 2022-09-24 DIAGNOSIS — C50911 Malignant neoplasm of unspecified site of right female breast: Secondary | ICD-10-CM

## 2022-09-24 DIAGNOSIS — Z79899 Other long term (current) drug therapy: Secondary | ICD-10-CM

## 2022-09-24 DIAGNOSIS — E538 Deficiency of other specified B group vitamins: Secondary | ICD-10-CM | POA: Insufficient documentation

## 2022-09-24 MED ORDER — METOCLOPRAMIDE HCL 10 MG PO TABS
10.0000 mg | ORAL_TABLET | Freq: Three times a day (TID) | ORAL | 0 refills | Status: DC | PRN
Start: 2022-09-24 — End: 2022-11-27

## 2022-09-24 MED ORDER — CYANOCOBALAMIN 1000 MCG/ML IJ SOLN
1000.0000 ug | Freq: Once | INTRAMUSCULAR | Status: AC
  Administered 2022-09-24: 1000 ug via INTRAMUSCULAR
  Filled 2022-09-24: qty 1

## 2022-09-24 NOTE — Progress Notes (Signed)
Patient tolerated injection with no complaints voiced. Site clean and dry with no bruising or swelling noted at site. See MAR for details. Band aid applied.  Patient stable during and after injection. VSS with discharge and left in satisfactory condition with no s/s of distress noted.  

## 2022-09-24 NOTE — Patient Instructions (Signed)
[facility] [place]  Discharge Instructions: Thank you for choosing [facility] to provide your oncology and hematology care.  If you have a lab appointment with the Cancer Center - please note that after April 8th, 2024, all labs will be drawn in the cancer center.  You do not have to check in or register with the main entrance as you have in the past but will complete your check-in in the cancer center.  Wear comfortable clothing and clothing appropriate for easy access to any Portacath or PICC line.   We strive to give you quality time with your provider. You may need to reschedule your appointment if you arrive late (15 or more minutes).  Arriving late affects you and other patients whose appointments are after yours.  Also, if you miss three or more appointments without notifying the office, you may be dismissed from the clinic at the provider's discretion.      For prescription refill requests, have your pharmacy contact our office and allow 72 hours for refills to be completed.    Today you received the following B12 injection, return as scheduled.   To help prevent nausea and vomiting after your treatment, we encourage you to take your nausea medication as directed.  BELOW ARE SYMPTOMS THAT SHOULD BE REPORTED IMMEDIATELY: *FEVER GREATER THAN 100.4 F (38 C) OR HIGHER *CHILLS OR SWEATING *NAUSEA AND VOMITING THAT IS NOT CONTROLLED WITH YOUR NAUSEA MEDICATION *UNUSUAL SHORTNESS OF BREATH *UNUSUAL BRUISING OR BLEEDING *URINARY PROBLEMS (pain or burning when urinating, or frequent urination) *BOWEL PROBLEMS (unusual diarrhea, constipation, pain near the anus) TENDERNESS IN MOUTH AND THROAT WITH OR WITHOUT PRESENCE OF ULCERS (sore throat, sores in mouth, or a toothache) UNUSUAL RASH, SWELLING OR PAIN  UNUSUAL VAGINAL DISCHARGE OR ITCHING   Items with * indicate a potential emergency and should be followed up as soon as possible or go to the Emergency  Department if any problems should occur.  Please show the CHEMOTHERAPY ALERT CARD or IMMUNOTHERAPY ALERT CARD at check-in to the Emergency Department and triage nurse.  Should you have questions after your visit or need to cancel or reschedule your appointment, please contact [facility] [place] [phone]  and follow the prompts.  Office hours are 8:00 a.m. to 4:30 p.m. Monday - Friday. Please note that voicemails left after 4:00 p.m. may not be returned until the following business day.  We are closed weekends and major holidays. You have access to a nurse at all times for urgent questions. Please call the main number to the clinic [phone] and follow the prompts.  For any non-urgent questions, you may also contact your provider using MyChart. We now offer e-Visits for anyone 18 and older to request care online for non-urgent symptoms. For details visit [email or web address].   Also download the MyChart app! Go to the app store, search "MyChart", open the app, select [place], and log in with your MyChart username and password.   

## 2022-10-14 ENCOUNTER — Ambulatory Visit: Payer: 59 | Attending: Cardiology | Admitting: *Deleted

## 2022-10-14 DIAGNOSIS — I4891 Unspecified atrial fibrillation: Secondary | ICD-10-CM

## 2022-10-14 DIAGNOSIS — Z5181 Encounter for therapeutic drug level monitoring: Secondary | ICD-10-CM | POA: Diagnosis not present

## 2022-10-14 LAB — POCT INR: INR: 2.4 (ref 2.0–3.0)

## 2022-10-14 NOTE — Patient Instructions (Signed)
Continue warfarin 1/2 tablet daily except 1 tablet on Mondays Keep greens consistent   Recheck in 6 weeks.

## 2022-10-27 ENCOUNTER — Inpatient Hospital Stay: Payer: 59 | Attending: Hematology

## 2022-10-27 VITALS — BP 111/47 | HR 72 | Temp 98.0°F | Resp 18

## 2022-10-27 DIAGNOSIS — E538 Deficiency of other specified B group vitamins: Secondary | ICD-10-CM | POA: Insufficient documentation

## 2022-10-27 DIAGNOSIS — C50911 Malignant neoplasm of unspecified site of right female breast: Secondary | ICD-10-CM

## 2022-10-27 DIAGNOSIS — M8580 Other specified disorders of bone density and structure, unspecified site: Secondary | ICD-10-CM

## 2022-10-27 DIAGNOSIS — Z79899 Other long term (current) drug therapy: Secondary | ICD-10-CM

## 2022-10-27 MED ORDER — CYANOCOBALAMIN 1000 MCG/ML IJ SOLN
1000.0000 ug | Freq: Once | INTRAMUSCULAR | Status: AC
  Administered 2022-10-27: 1000 ug via INTRAMUSCULAR
  Filled 2022-10-27: qty 1

## 2022-10-27 NOTE — Progress Notes (Signed)
Patient tolerated injection with no complaints voiced. Site clean and dry with no bruising or swelling noted at site. See MAR for details. Band aid applied.  Patient stable during and after injection. VSS with discharge and left in satisfactory condition with no s/s of distress noted.  

## 2022-10-27 NOTE — Patient Instructions (Signed)
[facility] [place]  Discharge Instructions: Thank you for choosing [facility] to provide your oncology and hematology care.  If you have a lab appointment with the Cancer Center - please note that after April 8th, 2024, all labs will be drawn in the cancer center.  You do not have to check in or register with the main entrance as you have in the past but will complete your check-in in the cancer center.  Wear comfortable clothing and clothing appropriate for easy access to any Portacath or PICC line.   We strive to give you quality time with your provider. You may need to reschedule your appointment if you arrive late (15 or more minutes).  Arriving late affects you and other patients whose appointments are after yours.  Also, if you miss three or more appointments without notifying the office, you may be dismissed from the clinic at the provider's discretion.      For prescription refill requests, have your pharmacy contact our office and allow 72 hours for refills to be completed.    Today you received the following B12 injection, return as scheduled.   To help prevent nausea and vomiting after your treatment, we encourage you to take your nausea medication as directed.  BELOW ARE SYMPTOMS THAT SHOULD BE REPORTED IMMEDIATELY: *FEVER GREATER THAN 100.4 F (38 C) OR HIGHER *CHILLS OR SWEATING *NAUSEA AND VOMITING THAT IS NOT CONTROLLED WITH YOUR NAUSEA MEDICATION *UNUSUAL SHORTNESS OF BREATH *UNUSUAL BRUISING OR BLEEDING *URINARY PROBLEMS (pain or burning when urinating, or frequent urination) *BOWEL PROBLEMS (unusual diarrhea, constipation, pain near the anus) TENDERNESS IN MOUTH AND THROAT WITH OR WITHOUT PRESENCE OF ULCERS (sore throat, sores in mouth, or a toothache) UNUSUAL RASH, SWELLING OR PAIN  UNUSUAL VAGINAL DISCHARGE OR ITCHING   Items with * indicate a potential emergency and should be followed up as soon as possible or go to the Emergency  Department if any problems should occur.  Please show the CHEMOTHERAPY ALERT CARD or IMMUNOTHERAPY ALERT CARD at check-in to the Emergency Department and triage nurse.  Should you have questions after your visit or need to cancel or reschedule your appointment, please contact [facility] [place] [phone]  and follow the prompts.  Office hours are 8:00 a.m. to 4:30 p.m. Monday - Friday. Please note that voicemails left after 4:00 p.m. may not be returned until the following business day.  We are closed weekends and major holidays. You have access to a nurse at all times for urgent questions. Please call the main number to the clinic [phone] and follow the prompts.  For any non-urgent questions, you may also contact your provider using MyChart. We now offer e-Visits for anyone 18 and older to request care online for non-urgent symptoms. For details visit [email or web address].   Also download the MyChart app! Go to the app store, search "MyChart", open the app, select [place], and log in with your MyChart username and password.   

## 2022-11-25 ENCOUNTER — Ambulatory Visit: Payer: 59 | Attending: Cardiology | Admitting: *Deleted

## 2022-11-25 DIAGNOSIS — Z5181 Encounter for therapeutic drug level monitoring: Secondary | ICD-10-CM | POA: Diagnosis not present

## 2022-11-25 DIAGNOSIS — I4891 Unspecified atrial fibrillation: Secondary | ICD-10-CM

## 2022-11-25 LAB — POCT INR: INR: 3.3 — AB (ref 2.0–3.0)

## 2022-11-25 NOTE — Patient Instructions (Signed)
Hold warfarin tonight then resume 1/2 tablet daily except 1 tablet on Mondays Keep greens consistent   Recheck in 6 weeks.

## 2022-11-27 ENCOUNTER — Inpatient Hospital Stay: Payer: 59 | Attending: Hematology

## 2022-11-27 VITALS — BP 106/61 | HR 74 | Temp 97.4°F | Resp 18

## 2022-11-27 DIAGNOSIS — M8580 Other specified disorders of bone density and structure, unspecified site: Secondary | ICD-10-CM

## 2022-11-27 DIAGNOSIS — C50911 Malignant neoplasm of unspecified site of right female breast: Secondary | ICD-10-CM

## 2022-11-27 DIAGNOSIS — Z79899 Other long term (current) drug therapy: Secondary | ICD-10-CM

## 2022-11-27 DIAGNOSIS — E538 Deficiency of other specified B group vitamins: Secondary | ICD-10-CM | POA: Diagnosis present

## 2022-11-27 DIAGNOSIS — R110 Nausea: Secondary | ICD-10-CM

## 2022-11-27 DIAGNOSIS — Z79811 Long term (current) use of aromatase inhibitors: Secondary | ICD-10-CM

## 2022-11-27 MED ORDER — CYANOCOBALAMIN 1000 MCG/ML IJ SOLN
1000.0000 ug | Freq: Once | INTRAMUSCULAR | Status: AC
  Administered 2022-11-27: 1000 ug via INTRAMUSCULAR
  Filled 2022-11-27: qty 1

## 2022-11-27 MED ORDER — METOCLOPRAMIDE HCL 10 MG PO TABS
10.0000 mg | ORAL_TABLET | Freq: Three times a day (TID) | ORAL | 0 refills | Status: AC | PRN
Start: 2022-11-27 — End: ?

## 2022-11-27 NOTE — Progress Notes (Signed)
Patient here for B12 injection. Patient  tolerated injection with no complaints voiced.  Site clean and dry with no bruising or swelling noted.  No complaints of pain.  Discharged with vital signs stable and no signs or symptoms of distress noted.

## 2022-11-27 NOTE — Patient Instructions (Signed)
[facility]  Discharge Instructions: Thank you for choosing [facility] to provide your oncology and hematology care.  If you have a lab appointment with the Cancer Center - please note that after April 8th, 2024, all labs will be drawn in the cancer center.  You do not have to check in or register with the main entrance as you have in the past but will complete your check-in in the cancer center.  Wear comfortable clothing and clothing appropriate for easy access to any Portacath or PICC line.   We strive to give you quality time with your provider. You may need to reschedule your appointment if you arrive late (15 or more minutes).  Arriving late affects you and other patients whose appointments are after yours.  Also, if you miss three or more appointments without notifying the office, you may be dismissed from the clinic at the provider's discretion.      For prescription refill requests, have your pharmacy contact our office and allow 72 hours for refills to be completed.    Today you received the following B12 injection.  Vitamin B12 Injection What is this medication? Vitamin B12 (VAHY tuh min B12) prevents and treats low vitamin B12 levels in your body. It is used in people who do not get enough vitamin B12 from their diet or when their digestive tract does not absorb enough. Vitamin B12 plays an important role in maintaining the health of your nervous system and red blood cells. This medicine may be used for other purposes; ask your health care provider or pharmacist if you have questions. COMMON BRAND NAME(S): B-12 Compliance Kit, B-12 Injection Kit, Cyomin, Dodex, LA-12, Nutri-Twelve, Physicians EZ Use B-12, Primabalt, Vitamin Deficiency Injectable System - B12 What should I tell my care team before I take this medication? They need to know if you have any of these conditions: Kidney disease Leber's disease Megaloblastic anemia An unusual or  allergic reaction to cyanocobalamin, cobalt, other medications, foods, dyes, or preservatives Pregnant or trying to get pregnant Breast-feeding How should I use this medication? This medication is injected into a muscle or deeply under the skin. It is usually given in a clinic or care team's office. However, your care team may teach you how to inject yourself. Follow all instructions. Talk to your care team about the use of this medication in children. Special care may be needed. Overdosage: If you think you have taken too much of this medicine contact a poison control center or emergency room at once. NOTE: This medicine is only for you. Do not share this medicine with others. What if I miss a dose? If you are given your dose at a clinic or care team's office, call to reschedule your appointment. If you give your own injections, and you miss a dose, take it as soon as you can. If it is almost time for your next dose, take only that dose. Do not take double or extra doses. What may interact with this medication? Alcohol Colchicine This list may not describe all possible interactions. Give your health care provider a list of all the medicines, herbs, non-prescription drugs, or dietary supplements you use. Also tell them if you smoke, drink alcohol, or use illegal drugs. Some items may interact with your medicine. What should I watch for while using this medication? Visit your care team regularly. You may need blood work done while you are taking this medication. You may need to follow a special  diet. Talk to your care team. Limit your alcohol intake and avoid smoking to get the best benefit. What side effects may I notice from receiving this medication? Side effects that you should report to your care team as soon as possible: Allergic reactions--skin rash, itching, hives, swelling of the face, lips, tongue, or throat Swelling of the ankles, hands, or feet Trouble breathing Side effects that  usually do not require medical attention (report to your care team if they continue or are bothersome): Diarrhea This list may not describe all possible side effects. Call your doctor for medical advice about side effects. You may report side effects to FDA at 1-800-FDA-1088. Where should I keep my medication? Keep out of the reach of children. Store at room temperature between 15 and 30 degrees C (59 and 85 degrees F). Protect from light. Throw away any unused medication after the expiration date. NOTE: This sheet is a summary. It may not cover all possible information. If you have questions about this medicine, talk to your doctor, pharmacist, or health care provider.  2024 Elsevier/Gold Standard (2020-12-25 00:00:00)    To help prevent nausea and vomiting after your treatment, we encourage you to take your nausea medication as directed.  BELOW ARE SYMPTOMS THAT SHOULD BE REPORTED IMMEDIATELY: *FEVER GREATER THAN 100.4 F (38 C) OR HIGHER *CHILLS OR SWEATING *NAUSEA AND VOMITING THAT IS NOT CONTROLLED WITH YOUR NAUSEA MEDICATION *UNUSUAL SHORTNESS OF BREATH *UNUSUAL BRUISING OR BLEEDING *URINARY PROBLEMS (pain or burning when urinating, or frequent urination) *BOWEL PROBLEMS (unusual diarrhea, constipation, pain near the anus) TENDERNESS IN MOUTH AND THROAT WITH OR WITHOUT PRESENCE OF ULCERS (sore throat, sores in mouth, or a toothache) UNUSUAL RASH, SWELLING OR PAIN  UNUSUAL VAGINAL DISCHARGE OR ITCHING   Items with * indicate a potential emergency and should be followed up as soon as possible or go to the Emergency Department if any problems should occur.  Please show the CHEMOTHERAPY ALERT CARD or IMMUNOTHERAPY ALERT CARD at check-in to the Emergency Department and triage nurse.  Should you have questions after your visit or need to cancel or reschedule your appointment, please contact [facility] [phone]  and follow the prompts.  Office hours are 8:00  a.m. to 4:30 p.m. Monday - Friday. Please note that voicemails left after 4:00 p.m. may not be returned until the following business day.  We are closed weekends and major holidays. You have access to a nurse at all times for urgent questions. Please call the main number to the clinic [phone] and follow the prompts.  For any non-urgent questions, you may also contact your provider using MyChart. We now offer e-Visits for anyone 63 and older to request care online for non-urgent symptoms. For details visit [email or web address].   Also download the MyChart app! Go to the app store, search "MyChart", open the app, select [place], and log in with your MyChart username and password.

## 2022-12-05 ENCOUNTER — Telehealth: Payer: Self-pay | Admitting: Cardiology

## 2022-12-05 MED ORDER — SIMVASTATIN 10 MG PO TABS: 10.0000 mg | ORAL_TABLET | Freq: Every day | ORAL | 0 refills | Status: DC

## 2022-12-05 NOTE — Telephone Encounter (Signed)
Pt c/o medication issue:  1. Name of Medication: simvastatin (ZOCOR) 10 MG tablet  Atorvastatin 2. How are you currently taking this medication (dosage and times per day)?   3. Are you having a reaction (difficulty breathing--STAT)? No  4. What is your medication issue? Patient was taking the Simvastatin, but last week another doctor had changed it to the Atorvastation. Patient has been taking the Atorvastation since last week since she is out of the Simvastatin. Patient is wanting to know which medication she should be taking. Please advise.

## 2022-12-05 NOTE — Telephone Encounter (Signed)
Angelica from Dr. 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 She states that they would prefer patient continue with the  simvastatin 10 mg with cardiologist office. Requesting this be filled for patient.

## 2022-12-05 NOTE — Addendum Note (Signed)
Addended by: Marlyn Corporal A on: 12/05/2022 11:36 AM   Modules accepted: Orders

## 2022-12-05 NOTE — Telephone Encounter (Signed)
Simvastatin refilled. 

## 2022-12-05 NOTE — Telephone Encounter (Signed)
Patient states Dr.Knowlton ( now retired) switched her to Atorvastatin at some point.She is going to call her new pcp , Dr.Hall to get a refill.

## 2022-12-30 ENCOUNTER — Inpatient Hospital Stay: Payer: 59 | Attending: Hematology

## 2022-12-30 VITALS — BP 118/75 | HR 64 | Temp 98.4°F | Resp 18

## 2022-12-30 DIAGNOSIS — E538 Deficiency of other specified B group vitamins: Secondary | ICD-10-CM | POA: Insufficient documentation

## 2022-12-30 DIAGNOSIS — M8580 Other specified disorders of bone density and structure, unspecified site: Secondary | ICD-10-CM

## 2022-12-30 DIAGNOSIS — Z79899 Other long term (current) drug therapy: Secondary | ICD-10-CM

## 2022-12-30 DIAGNOSIS — C50911 Malignant neoplasm of unspecified site of right female breast: Secondary | ICD-10-CM

## 2022-12-30 MED ORDER — CYANOCOBALAMIN 1000 MCG/ML IJ SOLN
1000.0000 ug | Freq: Once | INTRAMUSCULAR | Status: AC
  Administered 2022-12-30: 1000 ug via INTRAMUSCULAR
  Filled 2022-12-30: qty 1

## 2022-12-30 NOTE — Patient Instructions (Signed)
 [facility]  Discharge Instructions: Thank you for choosing [facility] to provide your oncology and hematology care.  If you have a lab appointment with the Cancer Center - please note that after April 8th, 2024, all labs will be drawn in the cancer center.  You do not have to check in or register with the main entrance as you have in the past but will complete your check-in in the cancer center.  Wear comfortable clothing and clothing appropriate for easy access to any Portacath or PICC line.   We strive to give you quality time with your provider. You may need to reschedule your appointment if you arrive late (15 or more minutes).  Arriving late affects you and other patients whose appointments are after yours.  Also, if you miss three or more appointments without notifying the office, you may be dismissed from the clinic at the provider's discretion.      For prescription refill requests, have your pharmacy contact our office and allow 72 hours for refills to be completed.    Today you received the following chemotherapy and/or immunotherapy agents B12 injection.  Vitamin B12 Injection What is this medication? Vitamin B12 (VAHY tuh min B12) prevents and treats low vitamin B12 levels in your body. It is used in people who do not get enough vitamin B12 from their diet or when their digestive tract does not absorb enough. Vitamin B12 plays an important role in maintaining the health of your nervous system and red blood cells. This medicine may be used for other purposes; ask your health care provider or pharmacist if you have questions. COMMON BRAND NAME(S): B-12 Compliance Kit, B-12 Injection Kit, Cyomin, Dodex, LA-12, Nutri-Twelve, Physicians EZ Use B-12, Primabalt, Vitamin Deficiency Injectable System - B12 What should I tell my care team before I take this medication? They need to know if you have any of these conditions: Kidney disease Leber's  disease Megaloblastic anemia An unusual or allergic reaction to cyanocobalamin, cobalt, other medications, foods, dyes, or preservatives Pregnant or trying to get pregnant Breast-feeding How should I use this medication? This medication is injected into a muscle or deeply under the skin. It is usually given in a clinic or care team's office. However, your care team may teach you how to inject yourself. Follow all instructions. Talk to your care team about the use of this medication in children. Special care may be needed. Overdosage: If you think you have taken too much of this medicine contact a poison control center or emergency room at once. NOTE: This medicine is only for you. Do not share this medicine with others. What if I miss a dose? If you are given your dose at a clinic or care team's office, call to reschedule your appointment. If you give your own injections, and you miss a dose, take it as soon as you can. If it is almost time for your next dose, take only that dose. Do not take double or extra doses. What may interact with this medication? Alcohol Colchicine This list may not describe all possible interactions. Give your health care provider a list of all the medicines, herbs, non-prescription drugs, or dietary supplements you use. Also tell them if you smoke, drink alcohol, or use illegal drugs. Some items may interact with your medicine. What should I watch for while using this medication? Visit your care team regularly. You may need blood work done while you are taking this medication. You may need  to follow a special diet. Talk to your care team. Limit your alcohol intake and avoid smoking to get the best benefit. What side effects may I notice from receiving this medication? Side effects that you should report to your care team as soon as possible: Allergic reactions--skin rash, itching, hives, swelling of the face, lips, tongue, or throat Swelling of the ankles, hands, or  feet Trouble breathing Side effects that usually do not require medical attention (report to your care team if they continue or are bothersome): Diarrhea This list may not describe all possible side effects. Call your doctor for medical advice about side effects. You may report side effects to FDA at 1-800-FDA-1088. Where should I keep my medication? Keep out of the reach of children. Store at room temperature between 15 and 30 degrees C (59 and 85 degrees F). Protect from light. Throw away any unused medication after the expiration date. NOTE: This sheet is a summary. It may not cover all possible information. If you have questions about this medicine, talk to your doctor, pharmacist, or health care provider.  2024 Elsevier/Gold Standard (2020-12-25 00:00:00)       To help prevent nausea and vomiting after your treatment, we encourage you to take your nausea medication as directed.  BELOW ARE SYMPTOMS THAT SHOULD BE REPORTED IMMEDIATELY: *FEVER GREATER THAN 100.4 F (38 C) OR HIGHER *CHILLS OR SWEATING *NAUSEA AND VOMITING THAT IS NOT CONTROLLED WITH YOUR NAUSEA MEDICATION *UNUSUAL SHORTNESS OF BREATH *UNUSUAL BRUISING OR BLEEDING *URINARY PROBLEMS (pain or burning when urinating, or frequent urination) *BOWEL PROBLEMS (unusual diarrhea, constipation, pain near the anus) TENDERNESS IN MOUTH AND THROAT WITH OR WITHOUT PRESENCE OF ULCERS (sore throat, sores in mouth, or a toothache) UNUSUAL RASH, SWELLING OR PAIN  UNUSUAL VAGINAL DISCHARGE OR ITCHING   Items with * indicate a potential emergency and should be followed up as soon as possible or go to the Emergency Department if any problems should occur.  Please show the CHEMOTHERAPY ALERT CARD or IMMUNOTHERAPY ALERT CARD at check-in to the Emergency Department and triage nurse.  Should you have questions after your visit or need to cancel or reschedule your appointment, please contact [facility] CENTER AT [facility] [phone]  and  follow the prompts.  Office hours are 8:00 a.m. to 4:30 p.m. Monday - Friday. Please note that voicemails left after 4:00 p.m. may not be returned until the following business day.  We are closed weekends and major holidays. You have access to a nurse at all times for urgent questions. Please call the main number to the clinic [phone] and follow the prompts.  For any non-urgent questions, you may also contact your provider using MyChart. We now offer e-Visits for anyone 83 and older to request care online for non-urgent symptoms. For details visit [email or web address].   Also download the MyChart app! Go to the app store, search "MyChart", open the app, select [place], and log in with your MyChart username and password.

## 2022-12-30 NOTE — Progress Notes (Signed)
Stacie Huang presents today for injection per the provider's orders.  B12 administration without incident; injection site WNL; see MAR for injection details.  Patient tolerated procedure well and without incident.  No questions or complaints noted at this time.   Discharged from clinic ambulatory in stable condition. Alert and oriented x 3. F/U with [facility] as scheduled.

## 2023-01-06 ENCOUNTER — Ambulatory Visit: Payer: 59 | Attending: Cardiology | Admitting: *Deleted

## 2023-01-06 DIAGNOSIS — I4891 Unspecified atrial fibrillation: Secondary | ICD-10-CM

## 2023-01-06 DIAGNOSIS — Z5181 Encounter for therapeutic drug level monitoring: Secondary | ICD-10-CM | POA: Diagnosis not present

## 2023-01-06 LAB — POCT INR: INR: 2.2 (ref 2.0–3.0)

## 2023-01-06 NOTE — Patient Instructions (Signed)
Continue warfarin 1/2 tablet daily except 1 tablet on Mondays Keep greens consistent   Recheck in 6 weeks.

## 2023-01-26 ENCOUNTER — Other Ambulatory Visit: Payer: Self-pay | Admitting: Hematology

## 2023-01-26 DIAGNOSIS — C50911 Malignant neoplasm of unspecified site of right female breast: Secondary | ICD-10-CM

## 2023-01-26 DIAGNOSIS — R110 Nausea: Secondary | ICD-10-CM

## 2023-01-26 DIAGNOSIS — Z79811 Long term (current) use of aromatase inhibitors: Secondary | ICD-10-CM

## 2023-01-29 ENCOUNTER — Inpatient Hospital Stay: Payer: 59 | Attending: Hematology

## 2023-01-29 VITALS — BP 101/53 | HR 75 | Temp 98.2°F | Resp 18

## 2023-01-29 DIAGNOSIS — E538 Deficiency of other specified B group vitamins: Secondary | ICD-10-CM | POA: Diagnosis present

## 2023-01-29 DIAGNOSIS — M8580 Other specified disorders of bone density and structure, unspecified site: Secondary | ICD-10-CM

## 2023-01-29 DIAGNOSIS — C50911 Malignant neoplasm of unspecified site of right female breast: Secondary | ICD-10-CM

## 2023-01-29 DIAGNOSIS — Z79899 Other long term (current) drug therapy: Secondary | ICD-10-CM

## 2023-01-29 MED ORDER — CYANOCOBALAMIN 1000 MCG/ML IJ SOLN
1000.0000 ug | Freq: Once | INTRAMUSCULAR | Status: AC
  Administered 2023-01-29: 1000 ug via INTRAMUSCULAR
  Filled 2023-01-29: qty 1

## 2023-01-29 NOTE — Patient Instructions (Signed)
[facility]  Discharge Instructions: Thank you for choosing [facility] to provide your oncology and hematology care.  If you have a lab appointment with the Cancer Center - please note that after April 8th, 2024, all labs will be drawn in the cancer center.  You do not have to check in or register with the main entrance as you have in the past but will complete your check-in in the cancer center.  Wear comfortable clothing and clothing appropriate for easy access to any Portacath or PICC line.   We strive to give you quality time with your provider. You may need to reschedule your appointment if you arrive late (15 or more minutes).  Arriving late affects you and other patients whose appointments are after yours.  Also, if you miss three or more appointments without notifying the office, you may be dismissed from the clinic at the provider's discretion.      For prescription refill requests, have your pharmacy contact our office and allow 72 hours for refills to be completed.    Today you received the following chemotherapy and/or immunotherapy agents B12      To help prevent nausea and vomiting after your treatment, we encourage you to take your nausea medication as directed.  BELOW ARE SYMPTOMS THAT SHOULD BE REPORTED IMMEDIATELY: *FEVER GREATER THAN 100.4 F (38 C) OR HIGHER *CHILLS OR SWEATING *NAUSEA AND VOMITING THAT IS NOT CONTROLLED WITH YOUR NAUSEA MEDICATION *UNUSUAL SHORTNESS OF BREATH *UNUSUAL BRUISING OR BLEEDING *URINARY PROBLEMS (pain or burning when urinating, or frequent urination) *BOWEL PROBLEMS (unusual diarrhea, constipation, pain near the anus) TENDERNESS IN MOUTH AND THROAT WITH OR WITHOUT PRESENCE OF ULCERS (sore throat, sores in mouth, or a toothache) UNUSUAL RASH, SWELLING OR PAIN  UNUSUAL VAGINAL DISCHARGE OR ITCHING   Items with * indicate a potential emergency and should be followed up as soon as possible or go to the  Emergency Department if any problems should occur.  Please show the CHEMOTHERAPY ALERT CARD or IMMUNOTHERAPY ALERT CARD at check-in to the Emergency Department and triage nurse.  Should you have questions after your visit or need to cancel or reschedule your appointment, please contact [facility] [phone]  and follow the prompts.  Office hours are 8:00 a.m. to 4:30 p.m. Monday - Friday. Please note that voicemails left after 4:00 p.m. may not be returned until the following business day.  We are closed weekends and major holidays. You have access to a nurse at all times for urgent questions. Please call the main number to the clinic [phone] and follow the prompts.  For any non-urgent questions, you may also contact your provider using MyChart. We now offer e-Visits for anyone 18 and older to request care online for non-urgent symptoms. For details visit [email or web address].   Also download the MyChart app! Go to the app store, search "MyChart", open the app, select [facility], and log in with your MyChart username and password.   

## 2023-01-29 NOTE — Progress Notes (Signed)
Stacie Huang presents today for B12 injection per the provider's orders.  Stable during administration without incident; injection site WNL; see MAR for injection details.  Patient tolerated procedure well and without incident.  No questions or complaints noted at this time.

## 2023-02-02 ENCOUNTER — Encounter: Payer: Self-pay | Admitting: Nurse Practitioner

## 2023-02-02 ENCOUNTER — Ambulatory Visit: Payer: 59 | Attending: Nurse Practitioner | Admitting: Nurse Practitioner

## 2023-02-02 VITALS — BP 130/76 | HR 67 | Ht 61.0 in | Wt 134.0 lb

## 2023-02-02 DIAGNOSIS — I10 Essential (primary) hypertension: Secondary | ICD-10-CM

## 2023-02-02 DIAGNOSIS — E785 Hyperlipidemia, unspecified: Secondary | ICD-10-CM | POA: Diagnosis not present

## 2023-02-02 DIAGNOSIS — I4891 Unspecified atrial fibrillation: Secondary | ICD-10-CM | POA: Diagnosis not present

## 2023-02-02 NOTE — Patient Instructions (Signed)
Medication Instructions:  Your physician recommends that you continue on your current medications as directed. Please refer to the Current Medication list given to you today.  *If you need a refill on your cardiac medications before your next appointment, please call your pharmacy*   Lab Work: NONE   If you have labs (blood work) drawn today and your tests are completely normal, you will receive your results only by: MyChart Message (if you have MyChart) OR A paper copy in the mail If you have any lab test that is abnormal or we need to change your treatment, we will call you to review the results.   Testing/Procedures: NONE    Follow-Up: At [facility], you and your health needs are our priority.  As part of our continuing mission to provide you with exceptional heart care, we have created designated Provider Care Teams.  These Care Teams include your primary Cardiologist (physician) and Advanced Practice Providers (APPs -  Physician Assistants and Nurse Practitioners) who all work together to provide you with the care you need, when you need it.  We recommend signing up for the patient portal called "MyChart".  Sign up information is provided on this After Visit Summary.  MyChart is used to connect with patients for Virtual Visits (Telemedicine).  Patients are able to view lab/test results, encounter notes, upcoming appointments, etc.  Non-urgent messages can be sent to your provider as well.   To learn more about what you can do with MyChart, go to [email or web address].    Your next appointment:   6 month(s)  Provider:   Dina Rich, MD or Sharlene Dory, NP    Other Instructions Thank you for choosing [facility]!

## 2023-02-02 NOTE — Progress Notes (Signed)
Cardiology Office Note:  .   Date:  02/02/2023  ID:  Stacie Huang, DOB 1940-05-08, MRN [id number] PCP: Gareth Morgan, MD  [facility] Providers Cardiologist:  Dina Rich, MD    History of Present Illness: .   Stacie Huang is a 82 y.o. female with a PMH of PAF, hyperlipidemia, hypertension, GERD, breast cancer (followed by oncology), who presents today for overdue 40-month follow-up appointment.  Last seen by Dr. Dina Rich on April 24, 2022.  Patient noted infrequent, mild palpitations.  EKG revealed rate controlled A-fib.  Overall was doing well at the time.  It was noted that she had not been interested in NOACs, was doing well on Coumadin.  Followed at Coumadin clinic.  Today she presents for overdue follow-up.  She states she is doing well. Denies any chest pain, shortness of breath, palpitations, syncope, presyncope, dizziness, orthopnea, PND, swelling or significant weight changes, acute bleeding, or claudication.   ROS: Negative. See HPI.   Studies Reviewed: .    Echo 07/2014: Study Conclusions   - Left ventricle: The cavity size was normal. Wall thickness was    normal. Systolic function was normal. The estimated ejection    fraction was in the range of 55% to 60%. Wall motion was normal;    there were no regional wall motion abnormalities.  - Aortic valve: Mildly calcified annulus. Trileaflet; normal    thickness leaflets. There was trivial regurgitation. Valve area    (VTI): 2.62 cm^2. Valve area (Vmax): 2.62 cm^2.  - Mitral valve: There was mild regurgitation.  - Left atrium: The atrium was moderately dilated.  - Right atrium: The atrium was mildly dilated.  - Technically adequate study.  Lexiscan 10/2009:  IMPRESSION:  Negative stress nuclear myocardial study revealing impaired  exercise capacity, a rapid increase in heart rate at low level  exercise consistent with physical deconditioning, no significant  stress-induced EKG abnormalities, normal  left ventricular size and  function and normal myocardial perfusion except for mild breast  attenuation artifact.  Other findings as noted.      Physical Exam:   VS:  BP 130/76   Pulse 67   Ht 5\' 1"  (1.549 m)   Wt 134 lb (60.8 kg)   SpO2 99%   BMI 25.32 kg/m    Wt Readings from Last 3 Encounters:  02/02/23 134 lb (60.8 kg)  04/24/22 140 lb (63.5 kg)  03/31/22 138 lb 9.6 oz (62.9 kg)    GEN: Well nourished, well developed in no acute distress NECK: No JVD; No carotid bruits CARDIAC: S1/S2, irregularly irregular rhythm, no murmurs, rubs, gallops RESPIRATORY:  Clear to auscultation without rales, wheezing or rhonchi  EXTREMITIES:  No edema; No deformity   ASSESSMENT AND PLAN: .    Atrial Fibrillation Denies any tachycardia or palpitations.  Heart rate is well-controlled today.  Continue atenolol and diltiazem for heart rate control and continue Coumadin for stroke prevention.  She is closely followed at the Coumadin clinic.  Denies any bleeding issues on Coumadin.  2. HTN BP is well controlled. Discussed to monitor BP at home at least 2 hours after medications and sitting for 5-10 minutes. No medication changes at this time. Heart healthy diet and regular cardiovascular exercise encouraged.   3. HLD LDL 57 10/2022. Continue simvastatin. Heart healthy diet and regular cardiovascular exercise encouraged.   Dispo: Follow-up with Dr. Dina Rich or APP in 6 months or sooner if anything changes.   Signed, Sharlene Dory, NP

## 2023-02-17 ENCOUNTER — Ambulatory Visit: Payer: 59 | Attending: Cardiology | Admitting: *Deleted

## 2023-02-17 DIAGNOSIS — I4891 Unspecified atrial fibrillation: Secondary | ICD-10-CM

## 2023-02-17 DIAGNOSIS — Z5181 Encounter for therapeutic drug level monitoring: Secondary | ICD-10-CM

## 2023-02-17 LAB — POCT INR: INR: 1.9 — AB (ref 2.0–3.0)

## 2023-02-17 NOTE — Patient Instructions (Signed)
Take warfarin 1 tablet tonight then resume 1/2 tablet daily except 1 tablet on Mondays Keep greens consistent   Recheck in 6 weeks.

## 2023-03-02 ENCOUNTER — Inpatient Hospital Stay: Payer: 59 | Attending: Hematology

## 2023-03-02 VITALS — BP 106/65 | HR 58 | Temp 98.2°F | Resp 17

## 2023-03-02 DIAGNOSIS — Z79899 Other long term (current) drug therapy: Secondary | ICD-10-CM

## 2023-03-02 DIAGNOSIS — M8580 Other specified disorders of bone density and structure, unspecified site: Secondary | ICD-10-CM

## 2023-03-02 DIAGNOSIS — E538 Deficiency of other specified B group vitamins: Secondary | ICD-10-CM | POA: Insufficient documentation

## 2023-03-02 DIAGNOSIS — C50911 Malignant neoplasm of unspecified site of right female breast: Secondary | ICD-10-CM

## 2023-03-02 MED ORDER — CYANOCOBALAMIN 1000 MCG/ML IJ SOLN
1000.0000 ug | Freq: Once | INTRAMUSCULAR | Status: AC
Start: 2023-03-02 — End: 2023-03-02
  Administered 2023-03-02: 1000 ug via INTRAMUSCULAR
  Filled 2023-03-02: qty 1

## 2023-03-02 MED ORDER — DENOSUMAB 60 MG/ML ~~LOC~~ SOSY: 60.0000 mg | PREFILLED_SYRINGE | Freq: Once | SUBCUTANEOUS | Status: DC

## 2023-03-02 NOTE — Patient Instructions (Addendum)
[facility] - A DEPT OF [facility]  Discharge Instructions: Thank you for choosing  Cancer Center to provide your oncology and hematology care.  If you have a lab appointment with the Cancer Center - please note that after April 8th, 2024, all labs will be drawn in the cancer center.  You do not have to check in or register with the main entrance as you have in the past but will complete your check-in in the cancer center.  Wear comfortable clothing and clothing appropriate for easy access to any Portacath or PICC line.   We strive to give you quality time with your provider. You may need to reschedule your appointment if you arrive late (15 or more minutes).  Arriving late affects you and other patients whose appointments are after yours.  Also, if you miss three or more appointments without notifying the office, you may be dismissed from the clinic at the provider's discretion.      For prescription refill requests, have your pharmacy contact our office and allow 72 hours for refills to be completed.    Today you received the following: Prolia/Vitamin B12.  Vitamin B12 Injection What is this medication? Vitamin B12 (VAHY tuh min B12) prevents and treats low vitamin B12 levels in your body. It is used in people who do not get enough vitamin B12 from their diet or when their digestive tract does not absorb enough. Vitamin B12 plays an important role in maintaining the health of your nervous system and red blood cells. This medicine may be used for other purposes; ask your health care provider or pharmacist if you have questions. COMMON BRAND NAME(S): B-12 Compliance Kit, B-12 Injection Kit, Cyomin, Dodex, LA-12, Nutri-Twelve, Physicians EZ Use B-12, Primabalt, Vitamin Deficiency Injectable System - B12 What should I tell my care team before I take this medication? They need to know if you have any of these conditions: Kidney disease Leber's  disease Megaloblastic anemia An unusual or allergic reaction to cyanocobalamin, cobalt, other medications, foods, dyes, or preservatives Pregnant or trying to get pregnant Breast-feeding How should I use this medication? This medication is injected into a muscle or deeply under the skin. It is usually given in a clinic or care team's office. However, your care team may teach you how to inject yourself. Follow all instructions. Talk to your care team about the use of this medication in children. Special care may be needed. Overdosage: If you think you have taken too much of this medicine contact a poison control center or emergency room at once. NOTE: This medicine is only for you. Do not share this medicine with others. What if I miss a dose? If you are given your dose at a clinic or care team's office, call to reschedule your appointment. If you give your own injections, and you miss a dose, take it as soon as you can. If it is almost time for your next dose, take only that dose. Do not take double or extra doses. What may interact with this medication? Alcohol Colchicine This list may not describe all possible interactions. Give your health care provider a list of all the medicines, herbs, non-prescription drugs, or dietary supplements you use. Also tell them if you smoke, drink alcohol, or use illegal drugs. Some items may interact with your medicine. What should I watch for while using this medication? Visit your care team regularly. You may need blood work done while you are taking this  medication. You may need to follow a special diet. Talk to your care team. Limit your alcohol intake and avoid smoking to get the best benefit. What side effects may I notice from receiving this medication? Side effects that you should report to your care team as soon as possible: Allergic reactions--skin rash, itching, hives, swelling of the face, lips, tongue, or throat Swelling of the ankles, hands, or  feet Trouble breathing Side effects that usually do not require medical attention (report to your care team if they continue or are bothersome): Diarrhea This list may not describe all possible side effects. Call your doctor for medical advice about side effects. You may report side effects to FDA at 1-800-FDA-1088. Where should I keep my medication? Keep out of the reach of children. Store at room temperature between 15 and 30 degrees C (59 and 85 degrees F). Protect from light. Throw away any unused medication after the expiration date. NOTE: This sheet is a summary. It may not cover all possible information. If you have questions about this medicine, talk to your doctor, pharmacist, or health care provider.  2024 Elsevier/Gold Standard (2020-12-25 00:00:00)    To help prevent nausea and vomiting after your treatment, we encourage you to take your nausea medication as directed.  BELOW ARE SYMPTOMS THAT SHOULD BE REPORTED IMMEDIATELY: *FEVER GREATER THAN 100.4 F (38 C) OR HIGHER *CHILLS OR SWEATING *NAUSEA AND VOMITING THAT IS NOT CONTROLLED WITH YOUR NAUSEA MEDICATION *UNUSUAL SHORTNESS OF BREATH *UNUSUAL BRUISING OR BLEEDING *URINARY PROBLEMS (pain or burning when urinating, or frequent urination) *BOWEL PROBLEMS (unusual diarrhea, constipation, pain near the anus) TENDERNESS IN MOUTH AND THROAT WITH OR WITHOUT PRESENCE OF ULCERS (sore throat, sores in mouth, or a toothache) UNUSUAL RASH, SWELLING OR PAIN  UNUSUAL VAGINAL DISCHARGE OR ITCHING   Items with * indicate a potential emergency and should be followed up as soon as possible or go to the Emergency Department if any problems should occur.  Please show the CHEMOTHERAPY ALERT CARD or IMMUNOTHERAPY ALERT CARD at check-in to the Emergency Department and triage nurse.  Should you have questions after your visit or need to cancel or reschedule your appointment, please contact [facility] - A DEPT OF [facility] [phone]  and follow the prompts.  Office hours are 8:00 a.m. to 4:30 p.m. Monday - Friday. Please note that voicemails left after 4:00 p.m. may not be returned until the following business day.  We are closed weekends and major holidays. You have access to a nurse at all times for urgent questions. Please call the main number to the clinic [phone] and follow the prompts.  For any non-urgent questions, you may also contact your provider using MyChart. We now offer e-Visits for anyone 48 and older to request care online for non-urgent symptoms. For details visit [email or web address].   Also download the MyChart app! Go to the app store, search "MyChart", open the app, select [facility], and log in with your MyChart username and password.

## 2023-03-02 NOTE — Progress Notes (Signed)
Patient presents today for Vitamin B12 injections.  Patient is in satisfactory condition with no new complaints voiced.  Vital signs are stable.  We will proceed with injection per provider orders.   Patient tolerated injection with no complaints voiced.  Site clean and dry with no bruising or swelling noted.  No complaints of pain.  Discharged with vital signs stable and no signs or symptoms of distress noted.

## 2023-03-04 ENCOUNTER — Other Ambulatory Visit: Payer: Self-pay | Admitting: Cardiology

## 2023-03-24 ENCOUNTER — Encounter (HOSPITAL_COMMUNITY): Payer: Self-pay | Admitting: Hematology

## 2023-03-27 ENCOUNTER — Other Ambulatory Visit: Payer: Self-pay | Admitting: Hematology

## 2023-03-27 DIAGNOSIS — R110 Nausea: Secondary | ICD-10-CM

## 2023-03-27 DIAGNOSIS — C50911 Malignant neoplasm of unspecified site of right female breast: Secondary | ICD-10-CM

## 2023-03-27 DIAGNOSIS — Z79811 Long term (current) use of aromatase inhibitors: Secondary | ICD-10-CM

## 2023-03-29 ENCOUNTER — Encounter (HOSPITAL_COMMUNITY): Payer: Self-pay | Admitting: Hematology

## 2023-03-30 ENCOUNTER — Other Ambulatory Visit: Payer: Self-pay | Admitting: *Deleted

## 2023-03-30 ENCOUNTER — Encounter (HOSPITAL_COMMUNITY): Payer: Self-pay | Admitting: Hematology

## 2023-03-30 DIAGNOSIS — R110 Nausea: Secondary | ICD-10-CM

## 2023-03-30 DIAGNOSIS — Z79811 Long term (current) use of aromatase inhibitors: Secondary | ICD-10-CM

## 2023-03-30 DIAGNOSIS — C50911 Malignant neoplasm of unspecified site of right female breast: Secondary | ICD-10-CM

## 2023-03-30 MED ORDER — METOCLOPRAMIDE HCL 10 MG PO TABS: 10.0000 mg | ORAL_TABLET | Freq: Three times a day (TID) | ORAL | 0 refills | Status: DC | PRN

## 2023-03-31 ENCOUNTER — Ambulatory Visit: Payer: 59 | Attending: Cardiology | Admitting: *Deleted

## 2023-03-31 DIAGNOSIS — Z5181 Encounter for therapeutic drug level monitoring: Secondary | ICD-10-CM | POA: Diagnosis not present

## 2023-03-31 DIAGNOSIS — I4891 Unspecified atrial fibrillation: Secondary | ICD-10-CM | POA: Diagnosis not present

## 2023-03-31 LAB — POCT INR: INR: 1.8 — AB (ref 2.0–3.0)

## 2023-03-31 NOTE — Patient Instructions (Signed)
Take warfarin 1 tablet tonight then increase dose to 1/2 tablet daily except 1 tablet on Mondays and Fridays Keep greens consistent   Recheck in 6 weeks.

## 2023-04-06 ENCOUNTER — Inpatient Hospital Stay: Payer: 59 | Attending: Hematology

## 2023-04-06 ENCOUNTER — Ambulatory Visit (HOSPITAL_COMMUNITY)
Admission: RE | Admit: 2023-04-06 | Discharge: 2023-04-06 | Disposition: A | Discharge status: Home / Self Care | Payer: 59 | Source: Ambulatory Visit | Attending: Physician Assistant | Admitting: Physician Assistant

## 2023-04-06 ENCOUNTER — Other Ambulatory Visit: Payer: Self-pay

## 2023-04-06 DIAGNOSIS — E538 Deficiency of other specified B group vitamins: Secondary | ICD-10-CM | POA: Insufficient documentation

## 2023-04-06 DIAGNOSIS — Z1231 Encounter for screening mammogram for malignant neoplasm of breast: Secondary | ICD-10-CM | POA: Insufficient documentation

## 2023-04-06 DIAGNOSIS — Z171 Estrogen receptor negative status [ER-]: Secondary | ICD-10-CM | POA: Insufficient documentation

## 2023-04-06 DIAGNOSIS — C50911 Malignant neoplasm of unspecified site of right female breast: Secondary | ICD-10-CM | POA: Diagnosis present

## 2023-04-06 DIAGNOSIS — C50912 Malignant neoplasm of unspecified site of left female breast: Secondary | ICD-10-CM | POA: Insufficient documentation

## 2023-04-06 DIAGNOSIS — M8580 Other specified disorders of bone density and structure, unspecified site: Secondary | ICD-10-CM | POA: Diagnosis present

## 2023-04-06 DIAGNOSIS — Z170 Estrogen receptor positive status [ER+]: Secondary | ICD-10-CM | POA: Insufficient documentation

## 2023-04-06 DIAGNOSIS — Z79899 Other long term (current) drug therapy: Secondary | ICD-10-CM | POA: Diagnosis not present

## 2023-04-06 DIAGNOSIS — Z79811 Long term (current) use of aromatase inhibitors: Secondary | ICD-10-CM | POA: Insufficient documentation

## 2023-04-06 LAB — COMPREHENSIVE METABOLIC PANEL
ALT: 14 U/L (ref 0–44)
AST: 18 U/L (ref 15–41)
Albumin: 4.1 g/dL (ref 3.5–5.0)
Alkaline Phosphatase: 50 U/L (ref 38–126)
Anion gap: 8 (ref 5–15)
BUN: 15 mg/dL (ref 8–23)
CO2: 26 mmol/L (ref 22–32)
Calcium: 9.9 mg/dL (ref 8.9–10.3)
Chloride: 105 mmol/L (ref 98–111)
Creatinine, Ser: 1.16 mg/dL — ABNORMAL HIGH (ref 0.44–1.00)
GFR, Estimated: 47 mL/min — ABNORMAL LOW (ref >60)
Glucose, Bld: 98 mg/dL (ref 70–99)
Potassium: 3.7 mmol/L (ref 3.5–5.1)
Sodium: 139 mmol/L (ref 135–145)
Total Bilirubin: 0.8 mg/dL (ref <1.2)
Total Protein: 7.1 g/dL (ref 6.5–8.1)

## 2023-04-06 LAB — CBC WITH DIFFERENTIAL/PLATELET
Abs Immature Granulocytes: 0.05 10*3/uL (ref 0.00–0.07)
Basophils Absolute: 0.1 10*3/uL (ref 0.0–0.1)
Basophils Relative: 1 %
Eosinophils Absolute: 0.1 10*3/uL (ref 0.0–0.5)
Eosinophils Relative: 2 %
HCT: 42.6 % (ref 36.0–46.0)
Hemoglobin: 13.2 g/dL (ref 12.0–15.0)
Immature Granulocytes: 1 %
Lymphocytes Relative: 41 %
Lymphs Abs: 3.1 10*3/uL (ref 0.7–4.0)
MCH: 29.6 pg (ref 26.0–34.0)
MCHC: 31 g/dL (ref 30.0–36.0)
MCV: 95.5 fL (ref 80.0–100.0)
Monocytes Absolute: 0.8 10*3/uL (ref 0.1–1.0)
Monocytes Relative: 11 %
Neutro Abs: 3.4 10*3/uL (ref 1.7–7.7)
Neutrophils Relative %: 44 %
Platelets: 220 10*3/uL (ref 150–400)
RBC: 4.46 MIL/uL (ref 3.87–5.11)
RDW: 13.7 % (ref 11.5–15.5)
WBC: 7.5 10*3/uL (ref 4.0–10.5)
nRBC: 0 % (ref 0.0–0.2)

## 2023-04-06 LAB — VITAMIN D 25 HYDROXY (VIT D DEFICIENCY, FRACTURES): Vit D, 25-Hydroxy: 74.92 ng/mL (ref 30–100)

## 2023-04-06 LAB — VITAMIN B12: Vitamin B-12: 260 pg/mL (ref 180–914)

## 2023-04-09 LAB — METHYLMALONIC ACID, SERUM: Methylmalonic Acid, Quantitative: 162 nmol/L (ref 0–378)

## 2023-04-12 NOTE — Progress Notes (Unsigned)
[facility] [address], [place] [address]   CLINIC:  Medical Oncology/Hematology  PCP:  Stacie Morgan, MD [address] / [place] [address] [phone]   REASON FOR VISIT:  Follow-up for stage Ia left breast cancer (2014) and stage Ia right breast cancer (2017)  BRIEF ONCOLOGIC HISTORY:   Oncology History  Malignant neoplasm of right female breast (HCC)  02/04/2013 Initial Diagnosis   Breast cancer of upper-inner quadrant of left female breast   02/23/2013 Surgery   left lumpectomy (UIQ), sentinel node biopsy--0.7cm, Node negative, ER+, PR+, HER-2/neu over expressed.   04/08/2013 - 04/14/2013 Chemotherapy   Paclitaxel/Herceptin weekly x 12. Intolerance to Paclitaxel and only receiving 2 cycles   05/04/2013 - 04/05/2014 Chemotherapy   Herceptin every 21 days    06/28/2013 -  Chemotherapy   Anastrazole started   08/02/2013 Imaging   Bone density- normal   04/06/2014 Imaging   MUGA- Left ventricular ejection fraction equals 53%.   08/06/2015 Imaging   Bone density- osteopenia   03/06/2016 Procedure   Stereotactic-guided biopsy of right breast upper outer quadrant focal asymmetry/calcifications. No apparent complications.   03/06/2016 Procedure   Successful placement of coil shaped marker within the right breast upper outer quadrant biopsy site, post stereotactic core needle biopsy.   Invasive ductal carcinoma of breast, female, right (HCC)  02/26/2016 Mammogram   1. Focal asymmetry within the upper-outer quadrant of the right breast, with associated new calcifications. This is a suspicious finding for which stereotactic biopsy with 3D tomosynthesis guidance is recommended. 2. Cluster of cysts at the 10 o'clock axis, 5 cm from the nipple, measuring 1.2 x 0.6 x 1.1 cm, a possible correlate for the mammographic asymmetry.   03/06/2016 Procedure   Right needle core biopsy   03/07/2016 Pathology Results   Breast, right, needle core  biopsy, upper outer quadrant - HIGH GRADE DUCTAL CARCINOMA IN SITU WITH NECROSIS AND ASSOCIATED CALCIFICATIONS.IMMUNOHISTOCHEMICAL AND MORPHOMETRIC ANALYSIS PERFORMED MANUALLY Estrogen Receptor: 0%, NEGATIVE Progesterone Receptor: 0%, NEGATIVE   04/02/2016 Procedure   Breast, lumpectomy, right by Dr. Lovell Huang   04/07/2016 Pathology Results   INVASIVE DUCTAL CARCINOMA, GRADE 2, SPANNING 0.6 CM THE CARCINOMA IS FOCALLY PRESENTED AT THE CAUTERIZED MEDIAL MARGIN EXTENSIVE DUCTAL CARCINOMA IN SITU, GRADE 3 ALL OTHER SURGICAL MARGIN ARE NEGATIVE FOR CARCINOMA ER 0% PR 0% Ki-67 15% HER2 NEGATIVE   05/05/2016 Pathology Results   MammaPrint: LOW RISK (97.8% of low risk Mammaprint patients who were treated with anti-hormonal therapy alone are living without distant recurrence of breast cancer at 5-years).   06/02/2016 - 07/04/2016 Radiation Therapy   XRT in [place], [place].  Right breast 42.56 Gy delivered in 16 fractions at 2.65 Gy per fraction and a right tumor bed boost 16 Gy delivered in 8 fractions at 2 Gy per fraction for a total dose of 58.56 Gy completed on 07/04/2016.     CANCER STAGING: Cancer Staging  Invasive ductal carcinoma of breast, female, right [facility]) Staging form: Breast, AJCC 8th Edition - Pathologic stage from 05/08/2016: Stage IB (pT1b, pN0, cM0, G2, ER-, PR-, HER2-) - Signed by Stacie Newer, PA-C on 05/08/2016  Malignant neoplasm of right female breast [facility]) Staging form: Breast, AJCC 7th Edition - Clinical stage from 03/16/2013: Stage IA (T1b, N0, cM0, Free text: stage I) - Unsigned - Pathologic: Stage IA (T1b, N0, cM0) - Signed by Stacie German, MD on 03/17/2013   INTERVAL HISTORY:   [name], a 82 y.o. female, returns  for routine follow-up of her left and right-sided breast cancers. Stacie Huang was last seen on 03/31/2022 by Stacie Brenner PA-C.  At today's visit, she  reports feeling fairly well.***  She denies any recent hospitalizations, surgeries, or changes in  her  baseline health status.  She denies any symptoms of recurrence such as new lumps, bone pain, chest pain, dyspnea, or abdominal pain. *** ***  She has no new headaches, seizures, or focal neurologic deficits.   ***No B symptoms such as fever, chills, night sweats, unintentional weight loss.  She continues to take daily Arimidex and is tolerating it well without any hot flashes or abnormal musculoskeletal pain.  *** ***She continues to take daily calcium/vitamin D and receives monthly B12 injections. *** She receives Prolia injections every 6 months and is tolerating this well without any abnormal bone pain, jaw pain, or recent fractures.  ***  She reports 100***% energy and 100***% appetite.  She is maintaining stable weight at this time.  ASSESSMENT & PLAN:  1.  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 She was treated with left lumpectomy followed by Taxol and Herceptin x12 weekly cycles.  Taxol discontinued after 2 cycles due to toxicities.  Herceptin was continued for 52 weeks. Anastrozole started in March 2015.  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 Status post right lumpectomy revealing TRIPLE NEGATIVE breast cancer. MammaPrint showed low risk disease. No adjuvant chemotherapy was recommended.  Adjuvant XRT completed on 07/04/2016.  - She has been on anastrozole since March 2015.  Goal is 10 years of therapy.  She is tolerating anastrozole well.  *** - Physical examination today showed right breast lumpectomy scar in the upper outer quadrant unchanged. No palpable masses or adenopathy.*** - Mammogram on 04/06/2023 was BI-RADS Category 1, negative  - Labs (04/06/2023): Normal CBC.  LFTs normal on CMP.  Baseline CKD stage IIIa. - PLAN: *** She is instructed to STOP anastrozole as of 07/28/2023  (will call in April 2025 to remind her) *** - Next mammogram due 04/06/2024 - RTC in 1 year for labs and office visit/physical exam.     2.   Osteopenia: - DEXA bone density scan (03/02/2018) with T-score -1.5 - Most recent bone density scan (04/04/2022): T-score -1.2 - Patient is taking calcium and vitamin D*** - She has been on anastrozole since 2015 - She has been receiving Prolia every 6 months since June 2017 *** - Most recent labs (04/06/2023) show normal calcium 9.9 with normal vitamin D 74.92 - PLAN: Continue calcium, vitamin D, and weightbearing exercises as tolerated. - Continue Prolia/CMP every 6 months. - At annual follow-up in December 2025 (following completion of anastrozole) we will discuss discontinuation protocol for Prolia, followed by 1 year of either Fosamax or Zometa to prevent rebound bone loss.  3.  Vitamin B12 deficiency: - History of severe vitamin B12 deficiency (Vitamin B12 was <50 in November 2021). - Patient is receiving monthly B12 injections. - Most recent labs (04/06/2023): Vitamin B12 260, MMA 162 - PLAN: Continue monthly B12 injections.  We will check B12/MMA at follow-up visit in 1 year.   PLAN SUMMARY: >> CMP + Prolia every 6 months >> Monthly B12 injections >> Mammogram due 04/07/2023 >> Labs in 1 year = CBC/D, CMP, vitamin D, B12, MMA >> OFFICE visit in 1 year (1 week after labs)    REVIEW  OF SYSTEMS: ***  Review of Systems - Oncology  PHYSICAL EXAM:   Performance status (ECOG): {CHL ONC ZO:[id number]} *** There were no vitals filed for this visit. Wt Readings from Last 3 Encounters:  02/02/23 134 lb (60.8 kg)  04/24/22 140 lb (63.5 kg)  03/31/22 138 lb 9.6 oz (62.9 kg)   Physical Exam Constitutional:      Appearance: Normal appearance.  HENT:     Head: Normocephalic and atraumatic.     Mouth/Throat:     Mouth: Mucous membranes are moist.  Eyes:     Extraocular Movements: Extraocular movements intact.     Pupils: Pupils are equal,  round, and reactive to light.  Cardiovascular:     Rate and Rhythm: Normal rate. Rhythm irregular.     Pulses: Normal pulses.     Heart sounds: Normal heart sounds.  Pulmonary:     Effort: Pulmonary effort is normal.     Breath sounds: Normal breath sounds.  Chest:    Abdominal:     General: Bowel sounds are normal.     Palpations: Abdomen is soft.     Tenderness: There is no abdominal tenderness.  Musculoskeletal:        General: No swelling.     Right lower leg: No edema.     Left lower leg: No edema.  Lymphadenopathy:     Cervical: No cervical adenopathy.  Skin:    General: Skin is warm and dry.  Neurological:     General: No focal deficit present.     Mental Status: She is alert and oriented to person, place, and time.  Psychiatric:        Mood and Affect: Mood normal.        Behavior: Behavior normal.      PAST MEDICAL/SURGICAL HISTORY:  Past Medical History:  Diagnosis Date   Anxiety    Breast cancer (HCC)    Cancer (HCC)    Chronic anticoagulation    GERD (gastroesophageal reflux disease)    Hyperlipidemia    Hypertension    Invasive ductal carcinoma of breast, female, right (HCC) 05/08/2016   Osteopenia determined by x-ray 10/03/2015   Paroxysmal atrial fibrillation (HCC)    Past Surgical History:  Procedure Laterality Date   ABDOMINAL HYSTERECTOMY     AXILLARY LYMPH NODE DISSECTION Left 02/23/2013   Procedure: LEFT AXILLARY LYMPH NODE DISSECTION;  Surgeon: Marlane Hatcher, MD;  Location: [facility];  Service: General;  Laterality: Left;   CHOLECYSTECTOMY     COLONOSCOPY  03/10/2002   RMR: Incomplete colonoscopy (sigmoidoscopy)/Normal rectum/Normal colon to 40 cm.    ESOPHAGOGASTRODUODENOSCOPY  03/10/2002   WJX:BJYNWG esophagus/small HH/Tiny AVM in antrum, otherwise normal stomach and D1 and D2/Status post passage of the [facility] dilator   PARTIAL MASTECTOMY WITH AXILLARY SENTINEL LYMPH NODE BIOPSY Left 02/23/2013   Procedure: LEFT PARTIAL MASTECTOMY WITH  AXILLARY SIMPLE NODE BIOPSY, LEFT BREAST WIDE EXCISION;  Surgeon: Marlane Hatcher, MD;  Location: [facility];  Service: General;  Laterality: Left;   PARTIAL MASTECTOMY WITH NEEDLE LOCALIZATION Right 04/02/2016   Procedure: RIGHT PARTIAL MASTECTOMY AFTER NEEDLE LOCALIZATION;  Surgeon: Franky Macho, MD;  Location: [facility];  Service: General;  Laterality: Right;   PORT-A-CATH REMOVAL Right 07/31/2017   Procedure: MINOR REMOVAL PORT-A-CATH;  Surgeon: Franky Macho, MD;  Location: [facility];  Service: General;  Laterality: Right;   PORTACATH PLACEMENT Right 04/07/2013   PORTACATH PLACEMENT Right 04/07/2013   Procedure: INSERTION PORT-A-CATH;  Surgeon: Marlane Hatcher, MD;  Location:  [facility];  Service: General;  Laterality: Right;    SOCIAL HISTORY:  Social History   Socioeconomic History   Marital status: Legally Separated    Spouse name: Not on file   Number of children:  2   Years of education: Not on file   Highest education level: Not on file  Occupational History   Occupation: Retired from Nurse, 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  Tobacco Use   Smoking status: Never   Smokeless tobacco: Never  Vaping Use   Vaping status: Never Used  Substance and Sexual Activity   Alcohol use: No   Drug use: No   Sexual activity: Yes    Birth control/protection: Surgical  Other Topics Concern   Not on file  Social History Narrative   2 adult children   Social Drivers of Corporate investment banker Strain: Not on file  Food Insecurity: Not on file  Transportation Needs: Not on file  Physical Activity: Not on file  Stress: Not on file  Social Connections: Not on file  Intimate Partner Violence: Not on file    FAMILY HISTORY:  Family History  Problem Relation Age of Onset   Heart disease Mother        Also 5 of 9 siblings   Heart attack Father    Leukemia Other    Leukemia Other    Colon cancer Neg Hx     CURRENT MEDICATIONS:  Current Outpatient Medications  Medication Sig Dispense Refill    amoxicillin (AMOXIL) 500 MG capsule Take 1 capsule (500 mg total) by mouth 3 (three) times daily. 15 capsule 0   anastrozole (ARIMIDEX) 1 MG tablet Take 1 tablet (1 mg total) by mouth daily. 90 tablet 3   atenolol (TENORMIN) 100 MG tablet Take 1 tablet (100 mg total) by mouth daily. 90 tablet 3   benzonatate (TESSALON) 100 MG capsule Take 1 capsule (100 mg total) by mouth every 8 (eight) hours. 21 capsule 0   calcium carbonate (OS-CAL) 600 MG TABS tablet Take 600 mg by mouth 2 (two) times daily with a meal.      cetirizine (ZYRTEC) 10 MG tablet Take 10 mg by mouth every morning.     Cholecalciferol (VITAMIN D) 400 UNITS capsule Take 400 Units by mouth 2 (two) times daily.     Cyanocobalamin 2000 MCG/ML SOLN Inject 1 mL as directed. Patient injects once a month.  Is not sure of dose.     denosumab (PROLIA) 60 MG/ML SOLN injection Inject 60 mg into the skin every 6 (six) months. Administer in upper arm, thigh, or abdomen     diltiazem (CARDIZEM CD) 120 MG 24 hr capsule TAKE (1) CAPSULE BY MOUTH ONCE A DAY. 90 capsule 3   doxycycline (VIBRAMYCIN) 100 MG capsule Take 1 capsule (100 mg total) by mouth 2 (two) times daily. One po bid x 7 days 10 capsule 0   fluticasone (FLONASE) 50 MCG/ACT nasal spray Place 2 sprays into both nostrils at bedtime.     LORazepam (ATIVAN) 1 MG tablet Take 1 mg by mouth at bedtime.     metoCLOPramide (REGLAN) 10 MG tablet Take 1 tablet (10 mg total) by mouth every 8 (eight) hours as needed. for nausea 120 tablet 0   pantoprazole (PROTONIX) 40 MG tablet TAKE (1) TABLET BY MOUTH TWICE DAILY. 60 tablet 5   predniSONE (DELTASONE) 10 MG tablet Take 10 mg by mouth daily.     psyllium (METAMUCIL) 58.6 % powder Take 1 packet by mouth daily.  simvastatin (ZOCOR) 10 MG tablet TAKE (1) TABLET BY MOUTH AT BEDTIME. 90 tablet 0   warfarin (COUMADIN) 2.5 MG tablet TAKE 1/2 TABLET BY MOUTH DAILY EXCEPT 1 TABLET ON MONDAY AND FRIDAYS. 30 tablet 5   No current facility-administered  medications for this visit.    ALLERGIES:  Allergies  Allergen Reactions   Iohexol Hives   Sulfa Antibiotics Rash   Sulfasalazine Rash    LABORATORY DATA:  I have reviewed the labs as listed.     Latest Ref Rng & Units 04/06/2023    1:05 PM 03/24/2022   11:43 AM 03/19/2021   12:03 PM  CBC  WBC 4.0 - 10.5 K/uL 7.5  8.1  6.7   Hemoglobin 12.0 - 15.0 g/dL 95.6  21.3  08.6   Hematocrit 36.0 - 46.0 % 42.6  39.7  40.8   Platelets 150 - 400 K/uL 220  332  287       Latest Ref Rng & Units 04/06/2023    1:05 PM 08/25/2022    1:49 PM 03/24/2022   11:43 AM  CMP  Glucose 70 - 99 mg/dL 98  578  469   BUN 8 - 23 mg/dL 15  16  14    Creatinine 0.44 - 1.00 mg/dL 6.29  5.28  4.13   Sodium 135 - 145 mmol/L 139  138  138   Potassium 3.5 - 5.1 mmol/L 3.7  3.6  4.2   Chloride 98 - 111 mmol/L 105  106  111   CO2 22 - 32 mmol/L 26  23  20    Calcium 8.9 - 10.3 mg/dL 9.9  9.4  8.8   Total Protein 6.5 - 8.1 g/dL 7.1  6.6  6.8   Total Bilirubin <1.2 mg/dL 0.8  0.9  0.5   Alkaline Phos 38 - 126 U/L 50  43  43   AST 15 - 41 U/L 18  18  20    ALT 0 - 44 U/L 14  13  13      DIAGNOSTIC IMAGING:  I have independently reviewed the scans and discussed with the patient. MM 3D SCREEN BREAST BILATERAL Result Date: 04/08/2023 CLINICAL DATA:  Screening. EXAM: DIGITAL SCREENING BILATERAL MAMMOGRAM WITH TOMOSYNTHESIS AND CAD TECHNIQUE: Bilateral screening digital craniocaudal and mediolateral oblique mammograms were obtained. Bilateral screening digital breast tomosynthesis was performed. The images were evaluated with computer-aided detection. COMPARISON:  Previous exam(s). ACR Breast Density Category b: There are scattered areas of fibroglandular density. FINDINGS: There are no findings suspicious for malignancy. IMPRESSION: No mammographic evidence of malignancy. A result letter of this screening mammogram will be mailed directly to the patient. RECOMMENDATION: Screening mammogram in one year. (Code:SM-B-01Y)  BI-RADS CATEGORY  1: Negative. Electronically Signed   By: Edwin Cap M.D.   On: 04/08/2023 08:46     WRAP UP:  All questions were answered. The patient knows to call the clinic with any problems, questions or concerns.  Medical decision making: ***  Time spent on visit: I spent {CHL ONC TIME VISIT - KGMWN:[id number]} counseling the patient face to face. The total time spent in the appointment was {CHL ONC TIME VISIT - IHKVQ:[id number]} and more than 50% was on counseling.  Carnella Guadalajara, PA-C  ***

## 2023-04-13 ENCOUNTER — Encounter: Payer: Self-pay | Admitting: Physician Assistant

## 2023-04-13 ENCOUNTER — Inpatient Hospital Stay (HOSPITAL_BASED_OUTPATIENT_CLINIC_OR_DEPARTMENT_OTHER): Payer: 59 | Admitting: Physician Assistant

## 2023-04-13 ENCOUNTER — Inpatient Hospital Stay: Payer: 59

## 2023-04-13 VITALS — BP 92/65 | HR 63 | Temp 97.9°F | Resp 18 | Wt 131.6 lb

## 2023-04-13 DIAGNOSIS — M8580 Other specified disorders of bone density and structure, unspecified site: Secondary | ICD-10-CM

## 2023-04-13 DIAGNOSIS — C50911 Malignant neoplasm of unspecified site of right female breast: Secondary | ICD-10-CM

## 2023-04-13 DIAGNOSIS — Z79899 Other long term (current) drug therapy: Secondary | ICD-10-CM

## 2023-04-13 DIAGNOSIS — E538 Deficiency of other specified B group vitamins: Secondary | ICD-10-CM | POA: Diagnosis not present

## 2023-04-13 DIAGNOSIS — Z79811 Long term (current) use of aromatase inhibitors: Secondary | ICD-10-CM | POA: Diagnosis not present

## 2023-04-13 DIAGNOSIS — Z170 Estrogen receptor positive status [ER+]: Secondary | ICD-10-CM

## 2023-04-13 MED ORDER — CYANOCOBALAMIN 1000 MCG/ML IJ SOLN
1000.0000 ug | Freq: Once | INTRAMUSCULAR | Status: AC
  Administered 2023-04-13: 1000 ug via INTRAMUSCULAR
  Filled 2023-04-13: qty 1

## 2023-04-13 MED ORDER — DENOSUMAB 60 MG/ML ~~LOC~~ SOSY
60.0000 mg | PREFILLED_SYRINGE | Freq: Once | SUBCUTANEOUS | Status: AC
  Administered 2023-04-13: 60 mg via SUBCUTANEOUS
  Filled 2023-04-13: qty 1

## 2023-04-13 NOTE — Patient Instructions (Signed)
[facility] **VISIT SUMMARY & IMPORTANT INSTRUCTIONS **   You were seen today by Rojelio Brenner PA-C for your history of breast cancer.    HISTORY OF BREAST CANCER Your breast exam today did not show any signs of breast cancer. Your most recent labs do not show any signs of recurrent breast cancer. Continue taking your anastrozole (Arimidex) once daily to help prevent recurrent breast cancer.  You can STOP Arimidex as of 07/28/2023. You will be due for labs, mammogram, and another office visit in 1 year (December 2025).  OSTEOPENIA Your breast cancer medication (anastrozole) can weaken your bones. Your most recent bone density scan (04/04/2022) shows mild weakness in your bones called "osteopenia."  This has improved compared to your bone density scan in 2019. Continue Prolia injections every 6 months to help prevent worsening bone loss related to cancer treatment.  B12 DEFICIENCY: Continue monthly B12 injections at the [facility].  ** Thank you for trusting me with your healthcare!  I strive to provide all of my patients with quality care at each visit.  If you receive a survey for this visit, I would be so grateful to you for taking the time to provide feedback.  Thank you in advance!  ~ Makenli Derstine                   Dr. Doreatha Massed   &   Rojelio Brenner, PA-C   - - - - - - - - - - - - - - - - - -    Thank you for choosing  Cancer Center at [facility] to provide your oncology and hematology care.  To afford each patient quality time with our provider, please arrive at least 15 minutes before your scheduled appointment time.   If you have a lab appointment with the Cancer Center please come in thru the Main Entrance and check in at the main information desk.  You need to re-schedule your appointment should you arrive 10 or more minutes late.  We strive to give you quality time with our providers, and arriving late  affects you and other patients whose appointments are after yours.  Also, if you no show three or more times for appointments you may be dismissed from the clinic at the providers discretion.     Again, thank you for choosing [facility].  Our hope is that these requests will decrease the amount of time that you wait before being seen by our physicians.       _____________________________________________________________  Should you have questions after your visit to [facility], please contact our office at [phone] and follow the prompts.  Our office hours are 8:00 a.m. and 4:30 p.m. Monday - Friday.  Please note that voicemails left after 4:00 p.m. may not be returned until the following business day.  We are closed weekends and major holidays.  You do have access to a nurse 24-7, just call the main number to the clinic [phone] and do not press any options, hold on the line and a nurse will answer the phone.    For prescription refill requests, have your pharmacy contact our office and allow 72 hours.

## 2023-04-13 NOTE — Progress Notes (Signed)
Tolerating Anastrozole and taking as prescribed with no noted side effects.

## 2023-04-13 NOTE — Progress Notes (Signed)
Patient tolerated Vitamin B12 and Prolia injections with no complaints voiced.  Site clean and dry with no bruising or swelling noted.  No complaints of pain.  Discharged with vital signs stable and no signs or symptoms of distress noted.

## 2023-04-13 NOTE — Patient Instructions (Signed)
[facility] [place] - [facility]  Discharge Instructions: Thank you for choosing [facility] to provide your oncology and hematology care.  If you have a lab appointment with the Cancer Center - please note that after April 8th, 2024, all labs will be drawn in the cancer center.  You do not have to check in or register with the main entrance as you have in the past but will complete your check-in in the cancer center.  Wear comfortable clothing and clothing appropriate for easy access to any Portacath or PICC line.   We strive to give you quality time with your provider. You may need to reschedule your appointment if you arrive late (15 or more minutes).  Arriving late affects you and other patients whose appointments are after yours.  Also, if you miss three or more appointments without notifying the office, you may be dismissed from the clinic at the provider's discretion.      For prescription refill requests, have your pharmacy contact our office and allow 72 hours for refills to be completed.    Today you received the following:  Vitamin B12/Prolia.  Vitamin B12 Injection What is this medication? Vitamin B12 (VAHY tuh min B12) prevents and treats low vitamin B12 levels in your body. It is used in people who do not get enough vitamin B12 from their diet or when their digestive tract does not absorb enough. Vitamin B12 plays an important role in maintaining the health of your nervous system and red blood cells. This medicine may be used for other purposes; ask your health care provider or pharmacist if you have questions. COMMON BRAND NAME(S): B-12 Compliance Kit, B-12 Injection Kit, Cyomin, Dodex, LA-12, Nutri-Twelve, Physicians EZ Use B-12, Primabalt, Vitamin Deficiency Injectable System - B12 What should I tell my care team before I take this medication? They need to know if you have any of these conditions: Kidney disease Leber's  disease Megaloblastic anemia An unusual or allergic reaction to cyanocobalamin, cobalt, other medications, foods, dyes, or preservatives Pregnant or trying to get pregnant Breast-feeding How should I use this medication? This medication is injected into a muscle or deeply under the skin. It is usually given in a clinic or care team's office. However, your care team may teach you how to inject yourself. Follow all instructions. Talk to your care team about the use of this medication in children. Special care may be needed. Overdosage: If you think you have taken too much of this medicine contact a poison control center or emergency room at once. NOTE: This medicine is only for you. Do not share this medicine with others. What if I miss a dose? If you are given your dose at a clinic or care team's office, call to reschedule your appointment. If you give your own injections, and you miss a dose, take it as soon as you can. If it is almost time for your next dose, take only that dose. Do not take double or extra doses. What may interact with this medication? Alcohol Colchicine This list may not describe all possible interactions. Give your health care provider a list of all the medicines, herbs, non-prescription drugs, or dietary supplements you use. Also tell them if you smoke, drink alcohol, or use illegal drugs. Some items may interact with your medicine. What should I watch for while using this medication? Visit your care team regularly. You may need blood work done while you are  taking this medication. You may need to follow a special diet. Talk to your care team. Limit your alcohol intake and avoid smoking to get the best benefit. What side effects may I notice from receiving this medication? Side effects that you should report to your care team as soon as possible: Allergic reactions--skin rash, itching, hives, swelling of the face, lips, tongue, or throat Swelling of the ankles, hands, or  feet Trouble breathing Side effects that usually do not require medical attention (report to your care team if they continue or are bothersome): Diarrhea This list may not describe all possible side effects. Call your doctor for medical advice about side effects. You may report side effects to FDA at 1-800-FDA-1088. Where should I keep my medication? Keep out of the reach of children. Store at room temperature between 15 and 30 degrees C (59 and 85 degrees F). Protect from light. Throw away any unused medication after the expiration date. NOTE: This sheet is a summary. It may not cover all possible information. If you have questions about this medicine, talk to your doctor, pharmacist, or health care provider.  2024 Elsevier/Gold Standard (2020-12-25 00:00:00)    Denosumab Injection (Osteoporosis) What is this medication? DENOSUMAB (den oh SUE mab) prevents and treats osteoporosis. It works by Interior and spatial designer stronger and less likely to break (fracture). It is a monoclonal antibody. This medicine may be used for other purposes; ask your health care provider or pharmacist if you have questions. COMMON BRAND NAME(S): Prolia What should I tell my care team before I take this medication? They need to know if you have any of these conditions: Dental or gum disease Had thyroid or parathyroid (glands located in neck) surgery Having dental surgery or a tooth pulled Kidney disease Low levels of calcium in the blood On dialysis Poor nutrition Thyroid disease Trouble absorbing nutrients from your food An unusual or allergic reaction to denosumab, other medications, foods, dyes, or preservatives Pregnant or trying to get pregnant Breastfeeding How should I use this medication? This medication is injected under the skin. It is given by your care team in a hospital or clinic setting. A special MedGuide will be given to you before each treatment. Be sure to read this information carefully each  time. Talk to your care team about the use of this medication in children. Special care may be needed. Overdosage: If you think you have taken too much of this medicine contact a poison control center or emergency room at once. NOTE: This medicine is only for you. Do not share this medicine with others. What if I miss a dose? Keep appointments for follow-up doses. It is important not to miss your dose. Call your care team if you are unable to keep an appointment. What may interact with this medication? Do not take this medication with any of the following: Other medications that contain denosumab This medication may also interact with the following: Medications that lower your chance of fighting infection Steroid medications, such as prednisone or cortisone This list may not describe all possible interactions. Give your health care provider a list of all the medicines, herbs, non-prescription drugs, or dietary supplements you use. Also tell them if you smoke, drink alcohol, or use illegal drugs. Some items may interact with your medicine. What should I watch for while using this medication? Your condition will be monitored carefully while you are receiving this medication. You may need blood work done while taking this medication. This medication may increase your  risk of getting an infection. Call your care team for advice if you get a fever, chills, sore throat, or other symptoms of a cold or flu. Do not treat yourself. Try to avoid being around people who are sick. Tell your dentist and dental surgeon that you are taking this medication. You should not have major dental surgery while on this medication. See your dentist to have a dental exam and fix any dental problems before starting this medication. Take good care of your teeth while on this medication. Make sure you see your dentist for regular follow-up appointments. This medication may cause low levels of calcium in your body. The risk of  severe side effects is increased in people with kidney disease. Your care team may prescribe calcium and vitamin D to help prevent low calcium levels while you take this medication. It is important to take calcium and vitamin D as directed by your care team. Talk to your care team if you may be pregnant. Serious birth defects may occur if you take this medication during pregnancy and for 5 months after the last dose. You will need a negative pregnancy test before starting this medication. Contraception is recommended while taking this medication and for 5 months after the last dose. Your care team can help you find the option that works for you. Talk to your care team before breastfeeding. Changes to your treatment plan may be needed. What side effects may I notice from receiving this medication? Side effects that you should report to your care team as soon as possible: Allergic reactions--skin rash, itching, hives, swelling of the face, lips, tongue, or throat Infection--fever, chills, cough, sore throat, wounds that don't heal, pain or trouble when passing urine, general feeling of discomfort or being unwell Low calcium level--muscle pain or cramps, confusion, tingling, or numbness in the hands or feet Osteonecrosis of the jaw--pain, swelling, or redness in the mouth, numbness of the jaw, poor healing after dental work, unusual discharge from the mouth, visible bones in the mouth Severe bone, joint, or muscle pain Skin infection--skin redness, swelling, warmth, or pain Side effects that usually do not require medical attention (report these to your care team if they continue or are bothersome): Back pain Headache Joint pain Muscle pain Pain in the hands, arms, legs, or feet Runny or stuffy nose Sore throat This list may not describe all possible side effects. Call your doctor for medical advice about side effects. You may report side effects to FDA at 1-800-FDA-1088. Where should I keep my  medication? This medication is given in a hospital or clinic. It will not be stored at home. NOTE: This sheet is a summary. It may not cover all possible information. If you have questions about this medicine, talk to your doctor, pharmacist, or health care provider.  2024 Elsevier/Gold Standard (2022-05-20 00:00:00)     To help prevent nausea and vomiting after your treatment, we encourage you to take your nausea medication as directed.  BELOW ARE SYMPTOMS THAT SHOULD BE REPORTED IMMEDIATELY: *FEVER GREATER THAN 100.4 F (38 C) OR HIGHER *CHILLS OR SWEATING *NAUSEA AND VOMITING THAT IS NOT CONTROLLED WITH YOUR NAUSEA MEDICATION *UNUSUAL SHORTNESS OF BREATH *UNUSUAL BRUISING OR BLEEDING *URINARY PROBLEMS (pain or burning when urinating, or frequent urination) *BOWEL PROBLEMS (unusual diarrhea, constipation, pain near the anus) TENDERNESS IN MOUTH AND THROAT WITH OR WITHOUT PRESENCE OF ULCERS (sore throat, sores in mouth, or a toothache) UNUSUAL RASH, SWELLING OR PAIN  UNUSUAL VAGINAL DISCHARGE OR ITCHING  Items with * indicate a potential emergency and should be followed up as soon as possible or go to the Emergency Department if any problems should occur.  Please show the CHEMOTHERAPY ALERT CARD or IMMUNOTHERAPY ALERT CARD at check-in to the Emergency Department and triage nurse.  Should you have questions after your visit or need to cancel or reschedule your appointment, please contact [facility]CER CTR  - A DEPT OF [facility] [phone]  and follow the prompts.  Office hours are 8:00 a.m. to 4:30 p.m. Monday - Friday. Please note that voicemails left after 4:00 p.m. may not be returned until the following business day.  We are closed weekends and major holidays. You have access to a nurse at all times for urgent questions. Please call the main number to the clinic [phone] and follow the prompts.  For any non-urgent questions, you may also contact your  provider using MyChart. We now offer e-Visits for anyone 36 and older to request care online for non-urgent symptoms. For details visit [email or web address].   Also download the MyChart app! Go to the app store, search "MyChart", open the app, select [place], and log in with your MyChart username and password.

## 2023-04-14 ENCOUNTER — Encounter (HOSPITAL_COMMUNITY): Payer: Self-pay | Admitting: Hematology

## 2023-04-16 ENCOUNTER — Other Ambulatory Visit: Payer: Self-pay | Admitting: Hematology

## 2023-04-16 DIAGNOSIS — C50911 Malignant neoplasm of unspecified site of right female breast: Secondary | ICD-10-CM

## 2023-04-23 ENCOUNTER — Other Ambulatory Visit: Payer: Self-pay | Admitting: Cardiology

## 2023-05-02 ENCOUNTER — Other Ambulatory Visit: Payer: Self-pay | Admitting: Cardiology

## 2023-05-02 DIAGNOSIS — I480 Paroxysmal atrial fibrillation: Secondary | ICD-10-CM

## 2023-05-04 NOTE — Telephone Encounter (Signed)
 Refill request for warfarin:  Last INR was 1.8 on 03/31/23 Next INR due 05/12/23 LOV was 02/02/23  Refill approved.

## 2023-05-08 ENCOUNTER — Other Ambulatory Visit: Payer: Self-pay | Admitting: Cardiology

## 2023-05-12 ENCOUNTER — Ambulatory Visit: Payer: 59 | Attending: Cardiology | Admitting: *Deleted

## 2023-05-12 ENCOUNTER — Telehealth: Payer: Self-pay | Admitting: Cardiology

## 2023-05-12 DIAGNOSIS — Z5181 Encounter for therapeutic drug level monitoring: Secondary | ICD-10-CM

## 2023-05-12 DIAGNOSIS — I4891 Unspecified atrial fibrillation: Secondary | ICD-10-CM | POA: Diagnosis not present

## 2023-05-12 LAB — POCT INR: INR: 2.2 (ref 2.0–3.0)

## 2023-05-12 MED ORDER — SIMVASTATIN 10 MG PO TABS: 10.0000 mg | ORAL_TABLET | Freq: Every day | ORAL | 2 refills | Status: DC

## 2023-05-12 NOTE — Telephone Encounter (Signed)
*  STAT* If patient is at the pharmacy, call can be transferred to refill team.   1. Which medications need to be refilled? (please list name of each medication and dose if known) Simvastatin  10mg    2. Would you like to learn more about the convenience, safety, & potential cost savings by using the [facility]? no   3. Are you open to using the [facility]. no  4. Which pharmacy/location (including street and city if local pharmacy) is medication to be sent to?[facility]   5. Do they need a 30 day or 90 day supply? 90

## 2023-05-12 NOTE — Telephone Encounter (Signed)
 Refill sent.

## 2023-05-12 NOTE — Patient Instructions (Signed)
 Continue warfarin 1/2 tablet daily except 1 tablet on Mondays and Fridays Keep greens consistent   Recheck in 6 weeks.

## 2023-05-14 ENCOUNTER — Inpatient Hospital Stay: Payer: 59 | Attending: Hematology

## 2023-05-14 VITALS — BP 126/58 | HR 91 | Temp 98.9°F | Resp 18

## 2023-05-14 DIAGNOSIS — E538 Deficiency of other specified B group vitamins: Secondary | ICD-10-CM | POA: Diagnosis present

## 2023-05-14 DIAGNOSIS — Z79899 Other long term (current) drug therapy: Secondary | ICD-10-CM

## 2023-05-14 DIAGNOSIS — M8580 Other specified disorders of bone density and structure, unspecified site: Secondary | ICD-10-CM

## 2023-05-14 DIAGNOSIS — C50911 Malignant neoplasm of unspecified site of right female breast: Secondary | ICD-10-CM

## 2023-05-14 MED ORDER — CYANOCOBALAMIN 1000 MCG/ML IJ SOLN
1000.0000 ug | Freq: Once | INTRAMUSCULAR | Status: AC
  Administered 2023-05-14: 1000 ug via INTRAMUSCULAR
  Filled 2023-05-14: qty 1

## 2023-05-14 NOTE — Patient Instructions (Signed)
[facility] [place] - A DEPT OF [facility]  Discharge Instructions: Thank you for choosing [facility] to provide your oncology and hematology care.  If you have a lab appointment with the Cancer Center - please note that after April 8th, 2024, all labs will be drawn in the cancer center.  You do not have to check in or register with the main entrance as you have in the past but will complete your check-in in the cancer center.  Wear comfortable clothing and clothing appropriate for easy access to any Portacath or PICC line.   We strive to give you quality time with your provider. You may need to reschedule your appointment if you arrive late (15 or more minutes).  Arriving late affects you and other patients whose appointments are after yours.  Also, if you miss three or more appointments without notifying the office, you may be dismissed from the clinic at the provider's discretion.      For prescription refill requests, have your pharmacy contact our office and allow 72 hours for refills to be completed.    Today you received the following injection: B12   To help prevent nausea and vomiting after your treatment, we encourage you to take your nausea medication as directed.  BELOW ARE SYMPTOMS THAT SHOULD BE REPORTED IMMEDIATELY: *FEVER GREATER THAN 100.4 F (38 C) OR HIGHER *CHILLS OR SWEATING *NAUSEA AND VOMITING THAT IS NOT CONTROLLED WITH YOUR NAUSEA MEDICATION *UNUSUAL SHORTNESS OF BREATH *UNUSUAL BRUISING OR BLEEDING *URINARY PROBLEMS (pain or burning when urinating, or frequent urination) *BOWEL PROBLEMS (unusual diarrhea, constipation, pain near the anus) TENDERNESS IN MOUTH AND THROAT WITH OR WITHOUT PRESENCE OF ULCERS (sore throat, sores in mouth, or a toothache) UNUSUAL RASH, SWELLING OR PAIN  UNUSUAL VAGINAL DISCHARGE OR ITCHING   Items with * indicate a potential emergency and should be followed up as soon as possible or go to the  Emergency Department if any problems should occur.  Please show the CHEMOTHERAPY ALERT CARD or IMMUNOTHERAPY ALERT CARD at check-in to the Emergency Department and triage nurse.  Should you have questions after your visit or need to cancel or reschedule your appointment, please contact [facility] CANCER CTR [place] - A DEPT OF [facility]ide[facility] [phone]  and follow the prompts.  Office hours are 8:00 a.m. to 4:30 p.m. Monday - Friday. Please note that voicemails left after 4:00 p.m. may not be returned until the following business day.  We are closed weekends and major holidays. You have access to a nurse at all times for urgent questions. Please call the main number to the clinic [phone] and follow the prompts.  For any non-urgent questions, you may also contact your provider using MyChart. We now offer e-Visits for anyone 53 and older to request care online for non-urgent symptoms. For details visit [email or web address].   Also download the MyChart app! Go to the app store, search "MyChart", open the app, select [place], and log in with your MyChart username and password.

## 2023-05-14 NOTE — Progress Notes (Signed)
B12 injection  given per orders. Patient tolerated it well without problems. Vitals stable and discharged home from clinic ambulatory. Follow up as scheduled.

## 2023-05-28 ENCOUNTER — Other Ambulatory Visit (HOSPITAL_COMMUNITY): Payer: Self-pay | Admitting: *Deleted

## 2023-05-28 DIAGNOSIS — R110 Nausea: Secondary | ICD-10-CM

## 2023-05-28 DIAGNOSIS — Z79811 Long term (current) use of aromatase inhibitors: Secondary | ICD-10-CM

## 2023-05-28 DIAGNOSIS — C50911 Malignant neoplasm of unspecified site of right female breast: Secondary | ICD-10-CM

## 2023-05-28 MED ORDER — METOCLOPRAMIDE HCL 10 MG PO TABS: 10.0000 mg | ORAL_TABLET | Freq: Three times a day (TID) | ORAL | 0 refills | Status: DC | PRN

## 2023-06-15 ENCOUNTER — Inpatient Hospital Stay: Payer: 59 | Attending: Hematology

## 2023-06-15 VITALS — BP 124/50 | HR 54 | Temp 98.2°F | Resp 18

## 2023-06-15 DIAGNOSIS — C50911 Malignant neoplasm of unspecified site of right female breast: Secondary | ICD-10-CM

## 2023-06-15 DIAGNOSIS — M8580 Other specified disorders of bone density and structure, unspecified site: Secondary | ICD-10-CM

## 2023-06-15 DIAGNOSIS — E538 Deficiency of other specified B group vitamins: Secondary | ICD-10-CM | POA: Insufficient documentation

## 2023-06-15 DIAGNOSIS — Z79899 Other long term (current) drug therapy: Secondary | ICD-10-CM

## 2023-06-15 MED ORDER — CYANOCOBALAMIN 1000 MCG/ML IJ SOLN
1000.0000 ug | Freq: Once | INTRAMUSCULAR | Status: AC
  Administered 2023-06-15: 1000 ug via INTRAMUSCULAR
  Filled 2023-06-15: qty 1

## 2023-06-15 NOTE — Progress Notes (Signed)
Stacie Huang presents today for injection per the provider's orders.  B12 administration without incident; injection site WNL; see MAR for injection details.  Patient tolerated procedure well and without incident.  No questions or complaints noted at this time.   Discharged from clinic ambulatory in stable condition. Alert and oriented x 3. F/U with [facility] as scheduled.

## 2023-06-15 NOTE — Patient Instructions (Signed)
 [facility] [place] - [facility]  Discharge Instructions: Thank you for choosing [facility] to provide your oncology and hematology care.  If you have a lab appointment with the Cancer Center - please note that after April 8th, 2024, all labs will be drawn in the cancer center.  You do not have to check in or register with the main entrance as you have in the past but will complete your check-in in the cancer center.  Wear comfortable clothing and clothing appropriate for easy access to any Portacath or PICC line.   We strive to give you quality time with your provider. You may need to reschedule your appointment if you arrive late (15 or more minutes).  Arriving late affects you and other patients whose appointments are after yours.  Also, if you miss three or more appointments without notifying the office, you may be dismissed from the clinic at the provider's discretion.      For prescription refill requests, have your pharmacy contact our office and allow 72 hours for refills to be completed.    Today you received B12 injection today.     BELOW ARE SYMPTOMS THAT SHOULD BE REPORTED IMMEDIATELY: *FEVER GREATER THAN 100.4 F (38 C) OR HIGHER *CHILLS OR SWEATING *NAUSEA AND VOMITING THAT IS NOT CONTROLLED WITH YOUR NAUSEA MEDICATION *UNUSUAL SHORTNESS OF BREATH *UNUSUAL BRUISING OR BLEEDING *URINARY PROBLEMS (pain or burning when urinating, or frequent urination) *BOWEL PROBLEMS (unusual diarrhea, constipation, pain near the anus) TENDERNESS IN MOUTH AND THROAT WITH OR WITHOUT PRESENCE OF ULCERS (sore throat, sores in mouth, or a toothache) UNUSUAL RASH, SWELLING OR PAIN  UNUSUAL VAGINAL DISCHARGE OR ITCHING   Items with * indicate a potential emergency and should be followed up as soon as possible or go to the Emergency Department if any problems should occur.  Please show the CHEMOTHERAPY ALERT CARD or IMMUNOTHERAPY ALERT CARD at  check-in to the Emergency Department and triage nurse.  Should you have questions after your visit or need to cancel or reschedule your appointment, please contact [facility] CANCER CTR  - A DEPT OF [facility] Bridegroom [facility] [phone]  and follow the prompts.  Office hours are 8:00 a.m. to 4:30 p.m. Monday - Friday. Please note that voicemails left after 4:00 p.m. may not be returned until the following business day.  We are closed weekends and major holidays. You have access to a nurse at all times for urgent questions. Please call the main number to the clinic [phone] and follow the prompts.  For any non-urgent questions, you may also contact your provider using MyChart. We now offer e-Visits for anyone 62 and older to request care online for non-urgent symptoms. For details visit [email or web address].   Also download the MyChart app! Go to the app store, search "MyChart", open the app, select [place], and log in with your MyChart username and password.

## 2023-06-23 ENCOUNTER — Ambulatory Visit: Payer: 59 | Attending: Cardiology | Admitting: *Deleted

## 2023-06-23 DIAGNOSIS — I4891 Unspecified atrial fibrillation: Secondary | ICD-10-CM | POA: Diagnosis not present

## 2023-06-23 DIAGNOSIS — Z5181 Encounter for therapeutic drug level monitoring: Secondary | ICD-10-CM | POA: Diagnosis not present

## 2023-06-23 LAB — POCT INR: INR: 2 (ref 2.0–3.0)

## 2023-06-23 NOTE — Patient Instructions (Signed)
 Continue warfarin 1/2 tablet daily except 1 tablet on Mondays and Fridays Keep greens consistent   Recheck in 6 weeks.

## 2023-07-13 ENCOUNTER — Inpatient Hospital Stay: Payer: 59 | Attending: Hematology

## 2023-07-13 VITALS — BP 104/61 | HR 70 | Temp 97.4°F | Resp 17

## 2023-07-13 DIAGNOSIS — M8580 Other specified disorders of bone density and structure, unspecified site: Secondary | ICD-10-CM

## 2023-07-13 DIAGNOSIS — Z79899 Other long term (current) drug therapy: Secondary | ICD-10-CM

## 2023-07-13 DIAGNOSIS — E538 Deficiency of other specified B group vitamins: Secondary | ICD-10-CM | POA: Diagnosis present

## 2023-07-13 DIAGNOSIS — C50911 Malignant neoplasm of unspecified site of right female breast: Secondary | ICD-10-CM

## 2023-07-13 MED ORDER — CYANOCOBALAMIN 1000 MCG/ML IJ SOLN
1000.0000 ug | Freq: Once | INTRAMUSCULAR | Status: AC
  Administered 2023-07-13: 1000 ug via INTRAMUSCULAR
  Filled 2023-07-13: qty 1

## 2023-07-13 NOTE — Patient Instructions (Signed)
 [facility] [place] - [facility]  Discharge Instructions: Thank you for choosing [facility] to provide your oncology and hematology care.  If you have a lab appointment with the Cancer Center - please note that after April 8th, 2024, all labs will be drawn in the cancer center.  You do not have to check in or register with the main entrance as you have in the past but will complete your check-in in the cancer center.  Wear comfortable clothing and clothing appropriate for easy access to any Portacath or PICC line.   We strive to give you quality time with your provider. You may need to reschedule your appointment if you arrive late (15 or more minutes).  Arriving late affects you and other patients whose appointments are after yours.  Also, if you miss three or more appointments without notifying the office, you may be dismissed from the clinic at the provider's discretion.      For prescription refill requests, have your pharmacy contact our office and allow 72 hours for refills to be completed.    Today you received B1 injection     BELOW ARE SYMPTOMS THAT SHOULD BE REPORTED IMMEDIATELY: *FEVER GREATER THAN 100.4 F (38 C) OR HIGHER *CHILLS OR SWEATING *NAUSEA AND VOMITING THAT IS NOT CONTROLLED WITH YOUR NAUSEA MEDICATION *UNUSUAL SHORTNESS OF BREATH *UNUSUAL BRUISING OR BLEEDING *URINARY PROBLEMS (pain or burning when urinating, or frequent urination) *BOWEL PROBLEMS (unusual diarrhea, constipation, pain near the anus) TENDERNESS IN MOUTH AND THROAT WITH OR WITHOUT PRESENCE OF ULCERS (sore throat, sores in mouth, or a toothache) UNUSUAL RASH, SWELLING OR PAIN  UNUSUAL VAGINAL DISCHARGE OR ITCHING   Items with * indicate a potential emergency and should be followed up as soon as possible or go to the Emergency Department if any problems should occur.  Please show the CHEMOTHERAPY ALERT CARD or IMMUNOTHERAPY ALERT CARD at check-in to  the Emergency Department and triage nurse.  Should you have questions after your visit or need to cancel or reschedule your appointment, please contact [facility] CANCER CTR [place] - A DEPT OF [facility] Bridegroom [facility] [phone]  and follow the prompts.  Office hours are 8:00 a.m. to 4:30 p.m. Monday - Friday. Please note that voicemails left after 4:00 p.m. may not be returned until the following business day.  We are closed weekends and major holidays. You have access to a nurse at all times for urgent questions. Please call the main number to the clinic [phone] and follow the prompts.  For any non-urgent questions, you may also contact your provider using MyChart. We now offer e-Visits for anyone 50 and older to request care online for non-urgent symptoms. For details visit [email or web address].   Also download the MyChart app! Go to the app store, search "MyChart", open the app, select [place], and log in with your MyChart username and password.

## 2023-07-13 NOTE — Progress Notes (Signed)
Stacie Huang presents today for injection per the provider's orders.  B12 administration without incident; injection site WNL; see MAR for injection details.  Patient tolerated procedure well and without incident.  No questions or complaints noted at this time.   Discharged from clinic ambulatory in stable condition. Alert and oriented x 3. F/U with [facility] as scheduled.

## 2023-07-28 ENCOUNTER — Telehealth: Payer: Self-pay | Admitting: Physician Assistant

## 2023-07-28 NOTE — Telephone Encounter (Signed)
 Reminder call to patient to notify her that she can STOP her anastrozole as of today (07/28/2023).  Patient verbalizes understanding and agreement.  Medication discontinued from her list.  We will see her for follow-up in December 2025, as scheduled.  Rojelio Brenner PA-C 07/28/2023 11:17 AM

## 2023-08-04 ENCOUNTER — Ambulatory Visit: Payer: 59 | Attending: Cardiology | Admitting: *Deleted

## 2023-08-04 DIAGNOSIS — I4891 Unspecified atrial fibrillation: Secondary | ICD-10-CM

## 2023-08-04 DIAGNOSIS — Z5181 Encounter for therapeutic drug level monitoring: Secondary | ICD-10-CM

## 2023-08-04 LAB — POCT INR: INR: 2.1 (ref 2.0–3.0)

## 2023-08-04 NOTE — Patient Instructions (Signed)
 Continue warfarin 1/2 tablet daily except 1 tablet on Mondays and Fridays Keep greens consistent   Recheck in 6 weeks.

## 2023-08-13 ENCOUNTER — Inpatient Hospital Stay: Payer: 59 | Attending: Hematology

## 2023-08-13 ENCOUNTER — Ambulatory Visit: Admitting: Cardiology

## 2023-08-13 VITALS — BP 113/60 | HR 73 | Temp 96.7°F | Resp 18

## 2023-08-13 DIAGNOSIS — Z79899 Other long term (current) drug therapy: Secondary | ICD-10-CM

## 2023-08-13 DIAGNOSIS — E538 Deficiency of other specified B group vitamins: Secondary | ICD-10-CM | POA: Diagnosis present

## 2023-08-13 DIAGNOSIS — C50911 Malignant neoplasm of unspecified site of right female breast: Secondary | ICD-10-CM

## 2023-08-13 DIAGNOSIS — M8580 Other specified disorders of bone density and structure, unspecified site: Secondary | ICD-10-CM

## 2023-08-13 MED ORDER — CYANOCOBALAMIN 1000 MCG/ML IJ SOLN
1000.0000 ug | Freq: Once | INTRAMUSCULAR | Status: AC
  Administered 2023-08-13: 1000 ug via INTRAMUSCULAR
  Filled 2023-08-13: qty 1

## 2023-08-13 NOTE — Progress Notes (Signed)
 Patient tolerated Vitamin B 12 injection with no complaints voiced.  Site clean and dry with no bruising or swelling noted.  No complaints of pain.  Discharged with vital signs stable and no signs or symptoms of distress noted.   ?

## 2023-08-13 NOTE — Patient Instructions (Signed)
 [facility] [place] - A DEPT OF [facility]  Discharge Instructions: Thank you for choosing [facility] to provide your oncology and hematology care.  If you have a lab appointment with the Cancer Center - please note that after April 8th, 2024, all labs will be drawn in the cancer center.  You do not have to check in or register with the main entrance as you have in the past but will complete your check-in in the cancer center.  Wear comfortable clothing and clothing appropriate for easy access to any Portacath or PICC line.   We strive to give you quality time with your provider. You may need to reschedule your appointment if you arrive late (15 or more minutes).  Arriving late affects you and other patients whose appointments are after yours.  Also, if you miss three or more appointments without notifying the office, you may be dismissed from the clinic at the provider's discretion.      For prescription refill requests, have your pharmacy contact our office and allow 72 hours for refills to be completed.    Today you received the following: Vitamin B12   Vitamin B12 Injection What is this medication? Vitamin B12 (VAHY tuh min B12) prevents and treats low vitamin B12 levels in your body. It is used in people who do not get enough vitamin B12 from their diet or when their digestive tract does not absorb enough. Vitamin B12 plays an important role in maintaining the health of your nervous system and red blood cells. This medicine may be used for other purposes; ask your health care provider or pharmacist if you have questions. COMMON BRAND NAME(S): B-12 Compliance Kit, B-12 Injection Kit, Cyomin, Dodex, LA-12, Nutri-Twelve, Physicians EZ Use B-12, Primabalt, Vitamin Deficiency Injectable System - B12 What should I tell my care team before I take this medication? They need to know if you have any of these conditions: Kidney disease Leber's  disease Megaloblastic anemia An unusual or allergic reaction to cyanocobalamin, cobalt, other medications, foods, dyes, or preservatives Pregnant or trying to get pregnant Breast-feeding How should I use this medication? This medication is injected into a muscle or deeply under the skin. It is usually given in a clinic or care team's office. However, your care team may teach you how to inject yourself. Follow all instructions. Talk to your care team about the use of this medication in children. Special care may be needed. Overdosage: If you think you have taken too much of this medicine contact a poison control center or emergency room at once. NOTE: This medicine is only for you. Do not share this medicine with others. What if I miss a dose? If you are given your dose at a clinic or care team's office, call to reschedule your appointment. If you give your own injections, and you miss a dose, take it as soon as you can. If it is almost time for your next dose, take only that dose. Do not take double or extra doses. What may interact with this medication? Alcohol Colchicine This list may not describe all possible interactions. Give your health care provider a list of all the medicines, herbs, non-prescription drugs, or dietary supplements you use. Also tell them if you smoke, drink alcohol, or use illegal drugs. Some items may interact with your medicine. What should I watch for while using this medication? Visit your care team regularly. You may need blood work done while you are  taking this medication. You may need to follow a special diet. Talk to your care team. Limit your alcohol intake and avoid smoking to get the best benefit. What side effects may I notice from receiving this medication? Side effects that you should report to your care team as soon as possible: Allergic reactions--skin rash, itching, hives, swelling of the face, lips, tongue, or throat Swelling of the ankles, hands, or  feet Trouble breathing Side effects that usually do not require medical attention (report to your care team if they continue or are bothersome): Diarrhea This list may not describe all possible side effects. Call your doctor for medical advice about side effects. You may report side effects to FDA at 1-800-FDA-1088. Where should I keep my medication? Keep out of the reach of children. Store at room temperature between 15 and 30 degrees C (59 and 85 degrees F). Protect from light. Throw away any unused medication after the expiration date. NOTE: This sheet is a summary. It may not cover all possible information. If you have questions about this medicine, talk to your doctor, pharmacist, or health care provider.  2024 Elsevier/Gold Standard (2020-12-25 00:00:00)    To help prevent nausea and vomiting after your treatment, we encourage you to take your nausea medication as directed.  BELOW ARE SYMPTOMS THAT SHOULD BE REPORTED IMMEDIATELY: *FEVER GREATER THAN 100.4 F (38 C) OR HIGHER *CHILLS OR SWEATING *NAUSEA AND VOMITING THAT IS NOT CONTROLLED WITH YOUR NAUSEA MEDICATION *UNUSUAL SHORTNESS OF BREATH *UNUSUAL BRUISING OR BLEEDING *URINARY PROBLEMS (pain or burning when urinating, or frequent urination) *BOWEL PROBLEMS (unusual diarrhea, constipation, pain near the anus) TENDERNESS IN MOUTH AND THROAT WITH OR WITHOUT PRESENCE OF ULCERS (sore throat, sores in mouth, or a toothache) UNUSUAL RASH, SWELLING OR PAIN  UNUSUAL VAGINAL DISCHARGE OR ITCHING   Items with * indicate a potential emergency and should be followed up as soon as possible or go to the Emergency Department if any problems should occur.  Please show the CHEMOTHERAPY ALERT CARD or IMMUNOTHERAPY ALERT CARD at check-in to the Emergency Department and triage nurse.  Should you have questions after your visit or need to cancel or reschedule your appointment, please contact [facility] [place] - A DEPT OF [facility] [phone]  and follow the prompts.  Office hours are 8:00 a.m. to 4:30 p.m. Monday - Friday. Please note that voicemails left after 4:00 p.m. may not be returned until the following business day.  We are closed weekends and major holidays. You have access to a nurse at all times for urgent questions. Please call the main number to the clinic [phone] and follow the prompts.  For any non-urgent questions, you may also contact your provider using MyChart. We now offer e-Visits for anyone 58 and older to request care online for non-urgent symptoms. For details visit [email or web address].   Also download the MyChart app! Go to the app store, search "MyChart", open the app, select [place], and log in with your MyChart username and password.

## 2023-08-17 ENCOUNTER — Other Ambulatory Visit: Payer: Self-pay | Admitting: Cardiology

## 2023-09-11 ENCOUNTER — Inpatient Hospital Stay: Payer: 59 | Attending: Hematology

## 2023-09-11 VITALS — BP 123/78 | HR 65 | Temp 98.4°F | Resp 18

## 2023-09-11 DIAGNOSIS — Z79899 Other long term (current) drug therapy: Secondary | ICD-10-CM

## 2023-09-11 DIAGNOSIS — M8580 Other specified disorders of bone density and structure, unspecified site: Secondary | ICD-10-CM

## 2023-09-11 DIAGNOSIS — E538 Deficiency of other specified B group vitamins: Secondary | ICD-10-CM | POA: Insufficient documentation

## 2023-09-11 DIAGNOSIS — C50911 Malignant neoplasm of unspecified site of right female breast: Secondary | ICD-10-CM

## 2023-09-11 MED ORDER — CYANOCOBALAMIN 1000 MCG/ML IJ SOLN
1000.0000 ug | Freq: Once | INTRAMUSCULAR | Status: AC
  Administered 2023-09-11: 1000 ug via INTRAMUSCULAR
  Filled 2023-09-11: qty 1

## 2023-09-11 NOTE — Patient Instructions (Signed)
 [facility] [place] - A DEPT OF [facility]  Discharge Instructions: Thank you for choosing [facility] to provide your oncology and hematology care.  If you have a lab appointment with the Cancer Center - please note that after April 8th, 2024, all labs will be drawn in the cancer center.  You do not have to check in or register with the main entrance as you have in the past but will complete your check-in in the cancer center.  Wear comfortable clothing and clothing appropriate for easy access to any Portacath or PICC line.   We strive to give you quality time with your provider. You may need to reschedule your appointment if you arrive late (15 or more minutes).  Arriving late affects you and other patients whose appointments are after yours.  Also, if you miss three or more appointments without notifying the office, you may be dismissed from the clinic at the provider's discretion.      For prescription refill requests, have your pharmacy contact our office and allow 72 hours for refills to be completed.    Today you received the following: Vitamin B12   Vitamin B12 Injection What is this medication? Vitamin B12 (VAHY tuh min B12) prevents and treats low vitamin B12 levels in your body. It is used in people who do not get enough vitamin B12 from their diet or when their digestive tract does not absorb enough. Vitamin B12 plays an important role in maintaining the health of your nervous system and red blood cells. This medicine may be used for other purposes; ask your health care provider or pharmacist if you have questions. COMMON BRAND NAME(S): B-12 Compliance Kit, B-12 Injection Kit, Cyomin, Dodex, LA-12, Nutri-Twelve, Physicians EZ Use B-12, Primabalt, Vitamin Deficiency Injectable System - B12 What should I tell my care team before I take this medication? They need to know if you have any of these conditions: Kidney disease Leber's  disease Megaloblastic anemia An unusual or allergic reaction to cyanocobalamin, cobalt, other medications, foods, dyes, or preservatives Pregnant or trying to get pregnant Breast-feeding How should I use this medication? This medication is injected into a muscle or deeply under the skin. It is usually given in a clinic or care team's office. However, your care team may teach you how to inject yourself. Follow all instructions. Talk to your care team about the use of this medication in children. Special care may be needed. Overdosage: If you think you have taken too much of this medicine contact a poison control center or emergency room at once. NOTE: This medicine is only for you. Do not share this medicine with others. What if I miss a dose? If you are given your dose at a clinic or care team's office, call to reschedule your appointment. If you give your own injections, and you miss a dose, take it as soon as you can. If it is almost time for your next dose, take only that dose. Do not take double or extra doses. What may interact with this medication? Alcohol Colchicine This list may not describe all possible interactions. Give your health care provider a list of all the medicines, herbs, non-prescription drugs, or dietary supplements you use. Also tell them if you smoke, drink alcohol, or use illegal drugs. Some items may interact with your medicine. What should I watch for while using this medication? Visit your care team regularly. You may need blood work done while you are  taking this medication. You may need to follow a special diet. Talk to your care team. Limit your alcohol intake and avoid smoking to get the best benefit. What side effects may I notice from receiving this medication? Side effects that you should report to your care team as soon as possible: Allergic reactions--skin rash, itching, hives, swelling of the face, lips, tongue, or throat Swelling of the ankles, hands, or  feet Trouble breathing Side effects that usually do not require medical attention (report to your care team if they continue or are bothersome): Diarrhea This list may not describe all possible side effects. Call your doctor for medical advice about side effects. You may report side effects to FDA at 1-800-FDA-1088. Where should I keep my medication? Keep out of the reach of children. Store at room temperature between 15 and 30 degrees C (59 and 85 degrees F). Protect from light. Throw away any unused medication after the expiration date. NOTE: This sheet is a summary. It may not cover all possible information. If you have questions about this medicine, talk to your doctor, pharmacist, or health care provider.  2024 Elsevier/Gold Standard (2020-12-25 00:00:00)    To help prevent nausea and vomiting after your treatment, we encourage you to take your nausea medication as directed.  BELOW ARE SYMPTOMS THAT SHOULD BE REPORTED IMMEDIATELY: *FEVER GREATER THAN 100.4 F (38 C) OR HIGHER *CHILLS OR SWEATING *NAUSEA AND VOMITING THAT IS NOT CONTROLLED WITH YOUR NAUSEA MEDICATION *UNUSUAL SHORTNESS OF BREATH *UNUSUAL BRUISING OR BLEEDING *URINARY PROBLEMS (pain or burning when urinating, or frequent urination) *BOWEL PROBLEMS (unusual diarrhea, constipation, pain near the anus) TENDERNESS IN MOUTH AND THROAT WITH OR WITHOUT PRESENCE OF ULCERS (sore throat, sores in mouth, or a toothache) UNUSUAL RASH, SWELLING OR PAIN  UNUSUAL VAGINAL DISCHARGE OR ITCHING   Items with * indicate a potential emergency and should be followed up as soon as possible or go to the Emergency Department if any problems should occur.  Please show the CHEMOTHERAPY ALERT CARD or IMMUNOTHERAPY ALERT CARD at check-in to the Emergency Department and triage nurse.  Should you have questions after your visit or need to cancel or reschedule your appointment, please contact [facility] [place] - A DEPT OF [facility] [phone]  and follow the prompts.  Office hours are 8:00 a.m. to 4:30 p.m. Monday - Friday. Please note that voicemails left after 4:00 p.m. may not be returned until the following business day.  We are closed weekends and major holidays. You have access to a nurse at all times for urgent questions. Please call the main number to the clinic [phone] and follow the prompts.  For any non-urgent questions, you may also contact your provider using MyChart. We now offer e-Visits for anyone 58 and older to request care online for non-urgent symptoms. For details visit [email or web address].   Also download the MyChart app! Go to the app store, search "MyChart", open the app, select [place], and log in with your MyChart username and password.

## 2023-09-11 NOTE — Progress Notes (Signed)
 Patient tolerated Vitamin B 12 injection with no complaints voiced.  Site clean and dry with no bruising or swelling noted.  No complaints of pain.  Discharged with vital signs stable and no signs or symptoms of distress noted.   ?

## 2023-09-14 ENCOUNTER — Other Ambulatory Visit: Payer: Self-pay | Admitting: Cardiology

## 2023-09-15 ENCOUNTER — Ambulatory Visit: Attending: Cardiology | Admitting: *Deleted

## 2023-09-15 DIAGNOSIS — I4891 Unspecified atrial fibrillation: Secondary | ICD-10-CM

## 2023-09-15 DIAGNOSIS — Z5181 Encounter for therapeutic drug level monitoring: Secondary | ICD-10-CM

## 2023-09-15 LAB — POCT INR: INR: 2.3 (ref 2.0–3.0)

## 2023-09-15 NOTE — Patient Instructions (Signed)
 Continue warfarin 1/2 tablet daily except 1 tablet on Mondays and Fridays Keep greens consistent   Recheck in 6 weeks.

## 2023-09-28 ENCOUNTER — Other Ambulatory Visit: Payer: Self-pay | Admitting: Physician Assistant

## 2023-09-28 ENCOUNTER — Other Ambulatory Visit: Payer: Self-pay | Admitting: Cardiology

## 2023-09-28 DIAGNOSIS — C50911 Malignant neoplasm of unspecified site of right female breast: Secondary | ICD-10-CM

## 2023-09-28 DIAGNOSIS — Z79811 Long term (current) use of aromatase inhibitors: Secondary | ICD-10-CM

## 2023-09-28 DIAGNOSIS — R110 Nausea: Secondary | ICD-10-CM

## 2023-10-07 ENCOUNTER — Other Ambulatory Visit: Payer: Self-pay | Admitting: Cardiology

## 2023-10-08 ENCOUNTER — Other Ambulatory Visit: Payer: Self-pay | Admitting: Cardiology

## 2023-10-12 ENCOUNTER — Inpatient Hospital Stay: Payer: 59

## 2023-10-12 ENCOUNTER — Inpatient Hospital Stay: Payer: 59 | Attending: Hematology

## 2023-10-12 VITALS — BP 100/44 | HR 56 | Temp 98.3°F | Resp 16

## 2023-10-12 DIAGNOSIS — Z79899 Other long term (current) drug therapy: Secondary | ICD-10-CM

## 2023-10-12 DIAGNOSIS — M8580 Other specified disorders of bone density and structure, unspecified site: Secondary | ICD-10-CM | POA: Insufficient documentation

## 2023-10-12 DIAGNOSIS — E538 Deficiency of other specified B group vitamins: Secondary | ICD-10-CM | POA: Diagnosis present

## 2023-10-12 DIAGNOSIS — C50911 Malignant neoplasm of unspecified site of right female breast: Secondary | ICD-10-CM

## 2023-10-12 LAB — COMPREHENSIVE METABOLIC PANEL WITH GFR
ALT: 11 U/L (ref 0–44)
AST: 17 U/L (ref 15–41)
Albumin: 3.8 g/dL (ref 3.5–5.0)
Alkaline Phosphatase: 40 U/L (ref 38–126)
Anion gap: 10 (ref 5–15)
BUN: 9 mg/dL (ref 8–23)
CO2: 23 mmol/L (ref 22–32)
Calcium: 9.2 mg/dL (ref 8.9–10.3)
Chloride: 106 mmol/L (ref 98–111)
Creatinine, Ser: 1.03 mg/dL — ABNORMAL HIGH (ref 0.44–1.00)
GFR, Estimated: 54 mL/min — ABNORMAL LOW (ref >60)
Glucose, Bld: 102 mg/dL — ABNORMAL HIGH (ref 70–99)
Potassium: 4.3 mmol/L (ref 3.5–5.1)
Sodium: 139 mmol/L (ref 135–145)
Total Bilirubin: 0.7 mg/dL (ref 0.0–1.2)
Total Protein: 6.5 g/dL (ref 6.5–8.1)

## 2023-10-12 MED ORDER — DENOSUMAB 60 MG/ML ~~LOC~~ SOSY
60.0000 mg | PREFILLED_SYRINGE | Freq: Once | SUBCUTANEOUS | Status: AC
  Administered 2023-10-12: 60 mg via SUBCUTANEOUS
  Filled 2023-10-12: qty 1

## 2023-10-12 MED ORDER — CYANOCOBALAMIN 1000 MCG/ML IJ SOLN
1000.0000 ug | Freq: Once | INTRAMUSCULAR | Status: AC
  Filled 2023-10-12: qty 1

## 2023-10-12 NOTE — Progress Notes (Signed)
 B12 and prolia  injections given per orders. Patient tolerated it well without problems. Vitals stable and discharged home from clinic ambulatory. Follow up as scheduled.

## 2023-10-12 NOTE — Patient Instructions (Signed)
 [facility] [place] - A DEPT OF [facility]  Discharge Instructions: Thank you for choosing [facility] to provide your oncology and hematology care.  If you have a lab appointment with the Cancer Center - please note that after April 8th, 2024, all labs will be drawn in the cancer center.  You do not have to check in or register with the main entrance as you have in the past but will complete your check-in in the cancer center.  Wear comfortable clothing and clothing appropriate for easy access to any Portacath or PICC line.   We strive to give you quality time with your provider. You may need to reschedule your appointment if you arrive late (15 or more minutes).  Arriving late affects you and other patients whose appointments are after yours.  Also, if you miss three or more appointments without notifying the office, you may be dismissed from the clinic at the provider's discretion.      For prescription refill requests, have your pharmacy contact our office and allow 72 hours for refills to be completed.    Today you received the following injections: B12 and prolia    To help prevent nausea and vomiting after your treatment, we encourage you to take your nausea medication as directed.  BELOW ARE SYMPTOMS THAT SHOULD BE REPORTED IMMEDIATELY: *FEVER GREATER THAN 100.4 F (38 C) OR HIGHER *CHILLS OR SWEATING *NAUSEA AND VOMITING THAT IS NOT CONTROLLED WITH YOUR NAUSEA MEDICATION *UNUSUAL SHORTNESS OF BREATH *UNUSUAL BRUISING OR BLEEDING *URINARY PROBLEMS (pain or burning when urinating, or frequent urination) *BOWEL PROBLEMS (unusual diarrhea, constipation, pain near the anus) TENDERNESS IN MOUTH AND THROAT WITH OR WITHOUT PRESENCE OF ULCERS (sore throat, sores in mouth, or a toothache) UNUSUAL RASH, SWELLING OR PAIN  UNUSUAL VAGINAL DISCHARGE OR ITCHING   Items with * indicate a potential emergency and should be followed up as soon as possible or go  to the Emergency Department if any problems should occur.  Please show the CHEMOTHERAPY ALERT CARD or IMMUNOTHERAPY ALERT CARD at check-in to the Emergency Department and triage nurse.  Should you have questions after your visit or need to cancel or reschedule your appointment, please contact [facility] [place] - A DEPT OF [facility] [phone]  and follow the prompts.  Office hours are 8:00 a.m. to 4:30 p.m. Monday - Friday. Please note that voicemails left after 4:00 p.m. may not be returned until the following business day.  We are closed weekends and major holidays. You have access to a nurse at all times for urgent questions. Please call the main number to the clinic [phone] and follow the prompts.  For any non-urgent questions, you may also contact your provider using MyChart. We now offer e-Visits for anyone 23 and older to request care online for non-urgent symptoms. For details visit [email or web address].   Also download the MyChart app! Go to the app store, search MyChart, open the app, select [place], and log in with your MyChart username and password.

## 2023-10-16 ENCOUNTER — Other Ambulatory Visit: Payer: Self-pay | Admitting: Cardiology

## 2023-10-22 ENCOUNTER — Other Ambulatory Visit: Payer: Self-pay | Admitting: Cardiology

## 2023-10-27 ENCOUNTER — Ambulatory Visit: Attending: Cardiology | Admitting: *Deleted

## 2023-10-27 DIAGNOSIS — Z5181 Encounter for therapeutic drug level monitoring: Secondary | ICD-10-CM

## 2023-10-27 DIAGNOSIS — I4891 Unspecified atrial fibrillation: Secondary | ICD-10-CM | POA: Diagnosis not present

## 2023-10-27 LAB — POCT INR: INR: 2 (ref 2.0–3.0)

## 2023-10-27 NOTE — Progress Notes (Signed)
Please see anticoagulation encounter.

## 2023-10-27 NOTE — Patient Instructions (Signed)
 Continue warfarin 1/2 tablet daily except 1 tablet on Mondays and Fridays Keep greens consistent   Recheck in 6 weeks.

## 2023-11-03 ENCOUNTER — Ambulatory Visit: Attending: Cardiology | Admitting: Cardiology

## 2023-11-03 ENCOUNTER — Encounter: Payer: Self-pay | Admitting: Cardiology

## 2023-11-03 VITALS — BP 106/60 | HR 64 | Ht 61.0 in | Wt 126.0 lb

## 2023-11-03 DIAGNOSIS — I4891 Unspecified atrial fibrillation: Secondary | ICD-10-CM | POA: Diagnosis not present

## 2023-11-03 DIAGNOSIS — I10 Essential (primary) hypertension: Secondary | ICD-10-CM | POA: Diagnosis not present

## 2023-11-03 DIAGNOSIS — E782 Mixed hyperlipidemia: Secondary | ICD-10-CM | POA: Diagnosis not present

## 2023-11-03 DIAGNOSIS — D6869 Other thrombophilia: Secondary | ICD-10-CM

## 2023-11-03 DIAGNOSIS — R0789 Other chest pain: Secondary | ICD-10-CM

## 2023-11-03 NOTE — Patient Instructions (Addendum)
 Medication Instructions:   Continue all current medications.   - j Labwork:  none  Testing/Procedures:  none  Follow-Up:  6 months   Any Other Special Instructions Will Be Listed Below (If Applicable).   If you need a refill on your cardiac medications before your next appointment, please call your pharmacy.

## 2023-11-03 NOTE — Progress Notes (Signed)
 Clinical Summary [name] is a 83 y.o.female  seen today for follow up of the following medical problem.s    1. Afib  - not interested in NOACs, remains on coumadin .   - no recent palpitations - compliant with meds, no bleeding coumadin .    2. Chest pain - started few months ago. Sharp pain 6/10 in severity. Occurs at rest, lasts 15-20 minutes. No other associated symptoms. Not positional. Occurs 2 times week. No relation to eating - no exertional symptoms, does pretty heavy housework.     3. HTN - she is compliant with meds   3. Hyperlipidemia - labs followed by pcp - 08/2023 TC 128 TG 128 HDL 52 LDL 54   4. 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      cetirizine (ZYRTEC) 10 MG tablet Take 10 mg by mouth every morning.     Cholecalciferol  (VITAMIN D ) 400 UNITS capsule Take 400 Units by mouth 2 (two) times daily.     Cyanocobalamin  2000 MCG/ML SOLN Inject 1 mL as directed. Patient injects once a month.  Is not sure of dose.     denosumab  (PROLIA ) 60 MG/ML SOLN injection Inject 60 mg into the skin every 6 (six) months. Administer in upper arm, thigh, or abdomen     diltiazem  (CARDIZEM  CD) 120 MG 24 hr capsule TAKE (1) CAPSULE BY MOUTH ONCE A DAY. 90  capsule 1   fluticasone  (FLONASE ) 50 MCG/ACT nasal spray Place 2 sprays into both nostrils at bedtime.     LORazepam  (ATIVAN ) 1 MG tablet Take 1 mg by mouth at bedtime.     metoCLOPramide  (REGLAN ) 10 MG tablet Take 1 tablet (10 mg total) by mouth every 8 (eight) hours as needed. for nausea 120 tablet 0   pantoprazole  (PROTONIX ) 40 MG tablet TAKE (1) TABLET BY MOUTH TWICE DAILY. 60 tablet 5   psyllium (METAMUCIL) 58.6 % powder Take 1 packet by mouth daily.      simvastatin  (ZOCOR ) 10 MG tablet Take 1 tablet (10 mg total) by mouth daily at 6 PM. 100 tablet 3   warfarin (COUMADIN ) 2.5 MG tablet TAKE (1/2) TABLET BY MOUTH DAILY EXCEPT 1 TABLET ON MONDAYS AND FRIDAYS. 30 tablet 5   No current facility-administered medications for this visit.     Past Surgical History:  Procedure Laterality Date   ABDOMINAL HYSTERECTOMY     AXILLARY LYMPH NODE DISSECTION Left 02/23/2013   Procedure: LEFT AXILLARY LYMPH NODE DISSECTION;  Surgeon: Stacie GORMAN Holland, MD;  Location: [facility];  Service: General;  Laterality: Left;   CHOLECYSTECTOMY     COLONOSCOPY  03/10/2002   RMR: Incomplete colonoscopy (sigmoidoscopy)/Normal  rectum/Normal colon to 40 cm.    ESOPHAGOGASTRODUODENOSCOPY  03/10/2002   MFM:Wnmfjo esophagus/small HH/Tiny AVM in antrum, otherwise normal stomach and D1 and D2/Status post passage of the [facility] dilator   PARTIAL MASTECTOMY WITH AXILLARY SENTINEL LYMPH NODE BIOPSY Left 02/23/2013   Procedure: LEFT PARTIAL MASTECTOMY WITH AXILLARY SIMPLE NODE BIOPSY, LEFT BREAST WIDE EXCISION;  Surgeon: Stacie GORMAN Holland, MD;  Location: [facility];  Service: General;  Laterality: Left;   PARTIAL MASTECTOMY WITH NEEDLE LOCALIZATION Right 04/02/2016   Procedure: RIGHT PARTIAL MASTECTOMY AFTER NEEDLE LOCALIZATION;  Surgeon: Stacie Budge, MD;  Location: [facility];  Service: General;  Laterality: Right;   PORT-A-CATH REMOVAL Right 07/31/2017   Procedure: MINOR REMOVAL PORT-A-CATH;  Surgeon: Huang Oneil, MD;  Location:  [facility];  Service: General;  Laterality: Right;   PORTACATH PLACEMENT Right 04/07/2013   PORTACATH PLACEMENT Right 04/07/2013   Procedure: INSERTION PORT-A-CATH;  Surgeon: Stacie GORMAN Holland, MD;  Location: [facility];  Service: General;  Laterality: Right;     Allergies  Allergen Reactions   Iohexol Hives   Sulfa Antibiotics Rash   Sulfasalazine Rash      Family History  Problem Relation Age of Onset   Heart disease Mother        Also 5 of 9 siblings   Heart attack Father    Leukemia Other    Leukemia Other    Colon cancer Neg Hx      Social History [name] reports that she has never smoked. She has never used smokeless tobacco. [name] reports no history of alcohol use.    Physical Examination Today's Vitals   11/03/23 1334  BP: 106/60  Pulse: 64  SpO2: 98%  Weight: 126 lb (57.2 kg)  Height: 5' 1 (1.549 m)   Body mass index is 23.81 kg/m.  Gen: resting comfortably, no acute distress HEENT: no scleral icterus, pupils equal round and reactive, no palptable cervical adenopathy,  CV: irreg, no m/rg, no jvd Resp: Clear to auscultation bilaterally GI: abdomen is soft, non-tender, non-distended, normal bowel sounds, no hepatosplenomegaly MSK: extremities are warm, no edema.  Skin: warm, no rash Neuro:  no focal deficits Psych: appropriate affect     Assessment and Plan   1. Afib/acquired thrombophilia - has not been interested in NOACs, continue coumadin  - EKG today shows rate controlled afib - no symptoms, continue current meds  2. Chest pain - noncardiac in description, monitor at this time   3. HTN -at goal, continue current meds   4. Hyperlipidemia - at goal, continue current meds     Stacie Huang, M.D

## 2023-11-11 ENCOUNTER — Inpatient Hospital Stay: Attending: Hematology

## 2023-11-11 VITALS — BP 128/73 | HR 54 | Temp 97.8°F | Resp 16

## 2023-11-11 DIAGNOSIS — M8580 Other specified disorders of bone density and structure, unspecified site: Secondary | ICD-10-CM

## 2023-11-11 DIAGNOSIS — E538 Deficiency of other specified B group vitamins: Secondary | ICD-10-CM | POA: Insufficient documentation

## 2023-11-11 DIAGNOSIS — C50911 Malignant neoplasm of unspecified site of right female breast: Secondary | ICD-10-CM

## 2023-11-11 DIAGNOSIS — Z79899 Other long term (current) drug therapy: Secondary | ICD-10-CM

## 2023-11-11 MED ORDER — CYANOCOBALAMIN 1000 MCG/ML IJ SOLN
1000.0000 ug | Freq: Once | INTRAMUSCULAR | Status: AC
  Administered 2023-11-11: 1000 ug via INTRAMUSCULAR
  Filled 2023-11-11: qty 1

## 2023-11-11 NOTE — Patient Instructions (Signed)

## 2023-11-12 ENCOUNTER — Inpatient Hospital Stay: Payer: 59

## 2023-11-30 ENCOUNTER — Other Ambulatory Visit: Payer: Self-pay | Admitting: Physician Assistant

## 2023-11-30 DIAGNOSIS — R110 Nausea: Secondary | ICD-10-CM

## 2023-11-30 DIAGNOSIS — C50911 Malignant neoplasm of unspecified site of right female breast: Secondary | ICD-10-CM

## 2023-11-30 DIAGNOSIS — Z79811 Long term (current) use of aromatase inhibitors: Secondary | ICD-10-CM

## 2023-12-08 ENCOUNTER — Ambulatory Visit: Attending: Cardiology | Admitting: *Deleted

## 2023-12-08 DIAGNOSIS — Z5181 Encounter for therapeutic drug level monitoring: Secondary | ICD-10-CM | POA: Diagnosis not present

## 2023-12-08 DIAGNOSIS — I4891 Unspecified atrial fibrillation: Secondary | ICD-10-CM | POA: Diagnosis not present

## 2023-12-08 LAB — POCT INR: INR: 2.4 (ref 2.0–3.0)

## 2023-12-08 NOTE — Progress Notes (Signed)
 INR 2.4 Please see anticoagulation encounter

## 2023-12-08 NOTE — Patient Instructions (Signed)
 Continue warfarin 1/2 tablet daily except 1 tablet on Mondays and Fridays Keep greens consistent   Recheck in 6 weeks.

## 2023-12-14 ENCOUNTER — Inpatient Hospital Stay: Payer: 59 | Attending: Hematology

## 2023-12-14 VITALS — BP 103/59 | HR 75 | Temp 98.3°F | Resp 19

## 2023-12-14 DIAGNOSIS — E538 Deficiency of other specified B group vitamins: Secondary | ICD-10-CM | POA: Diagnosis present

## 2023-12-14 DIAGNOSIS — M8580 Other specified disorders of bone density and structure, unspecified site: Secondary | ICD-10-CM

## 2023-12-14 DIAGNOSIS — C50911 Malignant neoplasm of unspecified site of right female breast: Secondary | ICD-10-CM

## 2023-12-14 DIAGNOSIS — Z79899 Other long term (current) drug therapy: Secondary | ICD-10-CM

## 2023-12-14 MED ORDER — CYANOCOBALAMIN 1000 MCG/ML IJ SOLN
1000.0000 ug | Freq: Once | INTRAMUSCULAR | Status: AC
  Administered 2023-12-14: 1000 ug via INTRAMUSCULAR
  Filled 2023-12-14: qty 1

## 2023-12-14 NOTE — Patient Instructions (Signed)
 Vitamin B12 Injection What is this medication? Vitamin B12 (VAHY tuh min B12) prevents and treats low vitamin B12 levels in your body. It is used in people who do not get enough vitamin B12 from their diet or when their digestive tract does not absorb enough. Vitamin B12 plays an important role in maintaining the health of your nervous system and red blood cells. This medicine may be used for other purposes; ask your health care provider or pharmacist if you have questions. COMMON BRAND NAME(S): B-12 Compliance Kit, B-12 Injection Kit, Cyomin, Dodex , LA-12, Nutri-Twelve, Physicians EZ Use B-12, Primabalt, Vitamin Deficiency Injectable System - B12 What should I tell my care team before I take this medication? They need to know if you have any of these conditions: Kidney disease Leber's disease Megaloblastic anemia An unusual or allergic reaction to cyanocobalamin , cobalt, other medications, foods, dyes, or preservatives Pregnant or trying to get pregnant Breast-feeding How should I use this medication? This medication is injected into a muscle or deeply under the skin. It is usually given in a clinic or care team's office. However, your care team may teach you how to inject yourself. Follow all instructions. Talk to your care team about the use of this medication in children. Special care may be needed. Overdosage: If you think you have taken too much of this medicine contact a poison control center or emergency room at once. NOTE: This medicine is only for you. Do not share this medicine with others. What if I miss a dose? If you are given your dose at a clinic or care team's office, call to reschedule your appointment. If you give your own injections, and you miss a dose, take it as soon as you can. If it is almost time for your next dose, take only that dose. Do not take double or extra doses. What may interact with this medication? Alcohol Colchicine This list may not describe all possible  interactions. Give your health care provider a list of all the medicines, herbs, non-prescription drugs, or dietary supplements you use. Also tell them if you smoke, drink alcohol, or use illegal drugs. Some items may interact with your medicine. What should I watch for while using this medication? Visit your care team regularly. You may need blood work done while you are taking this medication. You may need to follow a special diet. Talk to your care team. Limit your alcohol intake and avoid smoking to get the best benefit. What side effects may I notice from receiving this medication? Side effects that you should report to your care team as soon as possible: Allergic reactions--skin rash, itching, hives, swelling of the face, lips, tongue, or throat Swelling of the ankles, hands, or feet Trouble breathing Side effects that usually do not require medical attention (report to your care team if they continue or are bothersome): Diarrhea This list may not describe all possible side effects. Call your doctor for medical advice about side effects. You may report side effects to FDA at 1-800-FDA-1088. Where should I keep my medication? Keep out of the reach of children. Store at room temperature between 15 and 30 degrees C (59 and 85 degrees F). Protect from light. Throw away any unused medication after the expiration date. NOTE: This sheet is a summary. It may not cover all possible information. If you have questions about this medicine, talk to your doctor, pharmacist, or health care provider.  2024 Elsevier/Gold Standard (2020-12-25 00:00:00)

## 2023-12-14 NOTE — Progress Notes (Signed)
 Patient tolerated B12 injection with no complaints voiced.  Site clean and dry with no bruising or swelling noted at site.  See MAR for details.  Band aid applied.  Patient stable during and after injection.  Vss with discharge and left in satisfactory condition with no s/s of distress noted. All follow ups as scheduled.   Stacie Huang

## 2024-01-14 ENCOUNTER — Inpatient Hospital Stay: Payer: 59 | Attending: Hematology

## 2024-01-14 ENCOUNTER — Encounter (HOSPITAL_COMMUNITY): Payer: Self-pay | Admitting: Oncology

## 2024-01-14 VITALS — BP 141/55 | HR 87 | Temp 97.6°F | Resp 18

## 2024-01-14 DIAGNOSIS — E538 Deficiency of other specified B group vitamins: Secondary | ICD-10-CM | POA: Diagnosis present

## 2024-01-14 DIAGNOSIS — Z79899 Other long term (current) drug therapy: Secondary | ICD-10-CM

## 2024-01-14 DIAGNOSIS — C50911 Malignant neoplasm of unspecified site of right female breast: Secondary | ICD-10-CM

## 2024-01-14 DIAGNOSIS — M8580 Other specified disorders of bone density and structure, unspecified site: Secondary | ICD-10-CM

## 2024-01-14 MED ORDER — CYANOCOBALAMIN 1000 MCG/ML IJ SOLN
1000.0000 ug | Freq: Once | INTRAMUSCULAR | Status: AC
  Administered 2024-01-14: 1000 ug via INTRAMUSCULAR
  Filled 2024-01-14: qty 1

## 2024-01-14 NOTE — Progress Notes (Signed)
Stacie Huang presents today for injection per the provider's orders.  B12 administration without incident; injection site WNL; see MAR for injection details.  Patient tolerated procedure well and without incident.  No questions or complaints noted at this time.   Discharged from clinic ambulatory in stable condition. Alert and oriented x 3. F/U with [facility] as scheduled.

## 2024-01-14 NOTE — Patient Instructions (Signed)
 [facility] [place] - A DEPT OF [facility]  Discharge Instructions: Thank you for choosing [facility] to provide your oncology and hematology care.  If you have a lab appointment with the Cancer Center - please note that after April 8th, 2024, all labs will be drawn in the cancer center.  You do not have to check in or register with the main entrance as you have in the past but will complete your check-in in the cancer center.  Wear comfortable clothing and clothing appropriate for easy access to any Portacath or PICC line.   We strive to give you quality time with your provider. You may need to reschedule your appointment if you arrive late (15 or more minutes).  Arriving late affects you and other patients whose appointments are after yours.  Also, if you miss three or more appointments without notifying the office, you may be dismissed from the clinic at the provider's discretion.      For prescription refill requests, have your pharmacy contact our office and allow 72 hours for refills to be completed.    Today you received the following chemotherapy and/or immunotherapy agents Vitamin B12 injection.  Vitamin B12 Injection What is this medication? Vitamin B12 (VAHY tuh min B12) prevents and treats low vitamin B12 levels in your body. It is used in people who do not get enough vitamin B12 from their diet or when their digestive tract does not absorb enough. Vitamin B12 plays an important role in maintaining the health of your nervous system and red blood cells. This medicine may be used for other purposes; ask your health care provider or pharmacist if you have questions. COMMON BRAND NAME(S): B-12 Compliance Kit, B-12 Injection Kit, Cyomin, Dodex, LA-12, Nutri-Twelve, Physicians EZ Use B-12, Primabalt, Vitamin Deficiency Injectable System - B12 What should I tell my care team before I take this medication? They need to know if you have any of these  conditions: Kidney disease Leber's disease Megaloblastic anemia An unusual or allergic reaction to cyanocobalamin, cobalt, other medications, foods, dyes, or preservatives Pregnant or trying to get pregnant Breast-feeding How should I use this medication? This medication is injected into a muscle or deeply under the skin. It is usually given in a clinic or care team's office. However, your care team may teach you how to inject yourself. Follow all instructions. Talk to your care team about the use of this medication in children. Special care may be needed. Overdosage: If you think you have taken too much of this medicine contact a poison control center or emergency room at once. NOTE: This medicine is only for you. Do not share this medicine with others. What if I miss a dose? If you are given your dose at a clinic or care team's office, call to reschedule your appointment. If you give your own injections, and you miss a dose, take it as soon as you can. If it is almost time for your next dose, take only that dose. Do not take double or extra doses. What may interact with this medication? Alcohol Colchicine This list may not describe all possible interactions. Give your health care provider a list of all the medicines, herbs, non-prescription drugs, or dietary supplements you use. Also tell them if you smoke, drink alcohol, or use illegal drugs. Some items may interact with your medicine. What should I watch for while using this medication? Visit your care team regularly. You may need blood work  done while you are taking this medication. You may need to follow a special diet. Talk to your care team. Limit your alcohol intake and avoid smoking to get the best benefit. What side effects may I notice from receiving this medication? Side effects that you should report to your care team as soon as possible: Allergic reactions--skin rash, itching, hives, swelling of the face, lips, tongue, or  throat Swelling of the ankles, hands, or feet Trouble breathing Side effects that usually do not require medical attention (report to your care team if they continue or are bothersome): Diarrhea This list may not describe all possible side effects. Call your doctor for medical advice about side effects. You may report side effects to FDA at 1-800-FDA-1088. Where should I keep my medication? Keep out of the reach of children. Store at room temperature between 15 and 30 degrees C (59 and 85 degrees F). Protect from light. Throw away any unused medication after the expiration date. NOTE: This sheet is a summary. It may not cover all possible information. If you have questions about this medicine, talk to your doctor, pharmacist, or health care provider.  2024 Elsevier/Gold Standard (2020-12-25 00:00:00)      To help prevent nausea and vomiting after your treatment, we encourage you to take your nausea medication as directed.  BELOW ARE SYMPTOMS THAT SHOULD BE REPORTED IMMEDIATELY: *FEVER GREATER THAN 100.4 F (38 C) OR HIGHER *CHILLS OR SWEATING *NAUSEA AND VOMITING THAT IS NOT CONTROLLED WITH YOUR NAUSEA MEDICATION *UNUSUAL SHORTNESS OF BREATH *UNUSUAL BRUISING OR BLEEDING *URINARY PROBLEMS (pain or burning when urinating, or frequent urination) *BOWEL PROBLEMS (unusual diarrhea, constipation, pain near the anus) TENDERNESS IN MOUTH AND THROAT WITH OR WITHOUT PRESENCE OF ULCERS (sore throat, sores in mouth, or a toothache) UNUSUAL RASH, SWELLING OR PAIN  UNUSUAL VAGINAL DISCHARGE OR ITCHING   Items with * indicate a potential emergency and should be followed up as soon as possible or go to the Emergency Department if any problems should occur.  Please show the CHEMOTHERAPY ALERT CARD or IMMUNOTHERAPY ALERT CARD at check-in to the Emergency Department and triage nurse.  Should you have questions after your visit or need to cancel or reschedule your appointment, please contact [facility]  [facility] [place] - A DEPT OF [facility] [phone]  and follow the prompts.  Office hours are 8:00 a.m. to 4:30 p.m. Monday - Friday. Please note that voicemails left after 4:00 p.m. may not be returned until the following business day.  We are closed weekends and major holidays. You have access to a nurse at all times for urgent questions. Please call the main number to the clinic [phone] and follow the prompts.  For any non-urgent questions, you may also contact your provider using MyChart. We now offer e-Visits for anyone 4 and older to request care online for non-urgent symptoms. For details visit [email or web address].   Also download the MyChart app! Go to the app store, search "MyChart", open the app, select [place], and log in with your MyChart username and password.

## 2024-01-19 ENCOUNTER — Ambulatory Visit: Attending: Cardiology | Admitting: *Deleted

## 2024-01-19 DIAGNOSIS — Z5181 Encounter for therapeutic drug level monitoring: Secondary | ICD-10-CM | POA: Insufficient documentation

## 2024-01-19 DIAGNOSIS — I4891 Unspecified atrial fibrillation: Secondary | ICD-10-CM | POA: Insufficient documentation

## 2024-01-19 LAB — POCT INR: INR: 2.3 (ref 2.0–3.0)

## 2024-01-19 NOTE — Patient Instructions (Signed)
 Continue warfarin 1/2 tablet daily except 1 tablet on Mondays and Fridays Keep greens consistent   Recheck in 6 weeks.

## 2024-01-19 NOTE — Progress Notes (Signed)
 INR-2.3; Please see anticoagulation encounter

## 2024-01-29 ENCOUNTER — Other Ambulatory Visit: Payer: Self-pay | Admitting: Physician Assistant

## 2024-01-29 DIAGNOSIS — C50911 Malignant neoplasm of unspecified site of right female breast: Secondary | ICD-10-CM

## 2024-01-29 DIAGNOSIS — R110 Nausea: Secondary | ICD-10-CM

## 2024-01-29 DIAGNOSIS — Z79811 Long term (current) use of aromatase inhibitors: Secondary | ICD-10-CM

## 2024-01-29 NOTE — Telephone Encounter (Signed)
 Refill sent per standing orders.

## 2024-02-05 ENCOUNTER — Encounter (HOSPITAL_COMMUNITY): Payer: Self-pay | Admitting: Oncology

## 2024-02-10 ENCOUNTER — Other Ambulatory Visit: Payer: Self-pay | Admitting: Cardiology

## 2024-02-10 DIAGNOSIS — I480 Paroxysmal atrial fibrillation: Secondary | ICD-10-CM

## 2024-02-12 ENCOUNTER — Inpatient Hospital Stay: Payer: 59 | Attending: Hematology

## 2024-02-12 VITALS — BP 113/64 | HR 97 | Temp 97.5°F | Resp 18

## 2024-02-12 DIAGNOSIS — M8580 Other specified disorders of bone density and structure, unspecified site: Secondary | ICD-10-CM

## 2024-02-12 DIAGNOSIS — E538 Deficiency of other specified B group vitamins: Secondary | ICD-10-CM | POA: Insufficient documentation

## 2024-02-12 DIAGNOSIS — C50911 Malignant neoplasm of unspecified site of right female breast: Secondary | ICD-10-CM

## 2024-02-12 DIAGNOSIS — Z79899 Other long term (current) drug therapy: Secondary | ICD-10-CM

## 2024-02-12 MED ORDER — CYANOCOBALAMIN 1000 MCG/ML IJ SOLN
1000.0000 ug | Freq: Once | INTRAMUSCULAR | Status: AC
  Administered 2024-02-12: 1000 ug via INTRAMUSCULAR
  Filled 2024-02-12: qty 1

## 2024-02-12 NOTE — Patient Instructions (Signed)
 Vitamin B12 Injection What is this medication? Vitamin B12 (VAHY tuh min B12) prevents and treats low vitamin B12 levels in your body. It is used in people who do not get enough vitamin B12 from their diet or when their digestive tract does not absorb enough. Vitamin B12 plays an important role in maintaining the health of your nervous system and red blood cells. This medicine may be used for other purposes; ask your health care provider or pharmacist if you have questions. COMMON BRAND NAME(S): B-12 Compliance Kit, B-12 Injection Kit, Cyomin, Dodex , LA-12, Nutri-Twelve, Physicians EZ Use B-12, Primabalt, Vitamin Deficiency Injectable System - B12 What should I tell my care team before I take this medication? They need to know if you have any of these conditions: Kidney disease Leber's disease Megaloblastic anemia An unusual or allergic reaction to cyanocobalamin , cobalt, other medications, foods, dyes, or preservatives Pregnant or trying to get pregnant Breast-feeding How should I use this medication? This medication is injected into a muscle or deeply under the skin. It is usually given in a clinic or care team's office. However, your care team may teach you how to inject yourself. Follow all instructions. Talk to your care team about the use of this medication in children. Special care may be needed. Overdosage: If you think you have taken too much of this medicine contact a poison control center or emergency room at once. NOTE: This medicine is only for you. Do not share this medicine with others. What if I miss a dose? If you are given your dose at a clinic or care team's office, call to reschedule your appointment. If you give your own injections, and you miss a dose, take it as soon as you can. If it is almost time for your next dose, take only that dose. Do not take double or extra doses. What may interact with this medication? Alcohol Colchicine This list may not describe all possible  interactions. Give your health care provider a list of all the medicines, herbs, non-prescription drugs, or dietary supplements you use. Also tell them if you smoke, drink alcohol, or use illegal drugs. Some items may interact with your medicine. What should I watch for while using this medication? Visit your care team regularly. You may need blood work done while you are taking this medication. You may need to follow a special diet. Talk to your care team. Limit your alcohol intake and avoid smoking to get the best benefit. What side effects may I notice from receiving this medication? Side effects that you should report to your care team as soon as possible: Allergic reactions--skin rash, itching, hives, swelling of the face, lips, tongue, or throat Swelling of the ankles, hands, or feet Trouble breathing Side effects that usually do not require medical attention (report to your care team if they continue or are bothersome): Diarrhea This list may not describe all possible side effects. Call your doctor for medical advice about side effects. You may report side effects to FDA at 1-800-FDA-1088. Where should I keep my medication? Keep out of the reach of children. Store at room temperature between 15 and 30 degrees C (59 and 85 degrees F). Protect from light. Throw away any unused medication after the expiration date. NOTE: This sheet is a summary. It may not cover all possible information. If you have questions about this medicine, talk to your doctor, pharmacist, or health care provider.  2024 Elsevier/Gold Standard (2020-12-25 00:00:00)

## 2024-02-12 NOTE — Progress Notes (Signed)
 Patient tolerated B12 injection with no complaints voiced.  Site clean and dry with no bruising or swelling noted at site.  See MAR for details.  Band aid applied.  Patient stable during and after injection.  Vss with discharge and left in satisfactory condition with no s/s of distress noted. All follow ups as scheduled.   Farida Mcreynolds

## 2024-03-01 ENCOUNTER — Ambulatory Visit: Attending: Cardiology | Admitting: *Deleted

## 2024-03-01 DIAGNOSIS — I4891 Unspecified atrial fibrillation: Secondary | ICD-10-CM

## 2024-03-01 DIAGNOSIS — Z5181 Encounter for therapeutic drug level monitoring: Secondary | ICD-10-CM | POA: Diagnosis not present

## 2024-03-01 LAB — POCT INR: INR: 1.8 — AB (ref 2.0–3.0)

## 2024-03-01 NOTE — Progress Notes (Signed)
 INR 1.8. Please see anticoagulation encounter

## 2024-03-01 NOTE — Patient Instructions (Signed)
 Take warfarin 1 tablet tonight then resume 1/2 tablet daily except 1 tablet on Mondays and Fridays Keep greens consistent   Recheck in 6 weeks.

## 2024-03-14 ENCOUNTER — Inpatient Hospital Stay: Payer: 59 | Attending: Hematology

## 2024-03-14 VITALS — BP 120/65 | HR 66 | Temp 97.0°F | Resp 18

## 2024-03-14 DIAGNOSIS — Z79899 Other long term (current) drug therapy: Secondary | ICD-10-CM

## 2024-03-14 DIAGNOSIS — C50911 Malignant neoplasm of unspecified site of right female breast: Secondary | ICD-10-CM

## 2024-03-14 DIAGNOSIS — M8580 Other specified disorders of bone density and structure, unspecified site: Secondary | ICD-10-CM

## 2024-03-14 DIAGNOSIS — E538 Deficiency of other specified B group vitamins: Secondary | ICD-10-CM | POA: Insufficient documentation

## 2024-03-14 MED ORDER — CYANOCOBALAMIN 1000 MCG/ML IJ SOLN
1000.0000 ug | Freq: Once | INTRAMUSCULAR | Status: AC
  Administered 2024-03-14: 1000 ug via INTRAMUSCULAR
  Filled 2024-03-14: qty 1

## 2024-03-14 NOTE — Progress Notes (Signed)
 Patient tolerated B12 injection with no complaints voiced.  Site clean and dry with no bruising or swelling noted at site.  See MAR for details.  Band aid applied.  Patient stable during and after injection.  Vss with discharge and left in satisfactory condition with no s/s of distress noted. All follow ups as scheduled.   Stacie Huang

## 2024-03-14 NOTE — Patient Instructions (Signed)
 Vitamin B12 Injection What is this medication? Vitamin B12 (VAHY tuh min B12) prevents and treats low vitamin B12 levels in your body. It is used in people who do not get enough vitamin B12 from their diet or when their digestive tract does not absorb enough. Vitamin B12 plays an important role in maintaining the health of your nervous system and red blood cells. This medicine may be used for other purposes; ask your health care provider or pharmacist if you have questions. COMMON BRAND NAME(S): B-12 Compliance Kit, B-12 Injection Kit, Cyomin, Dodex , LA-12, Nutri-Twelve, Physicians EZ Use B-12, Primabalt, Vitamin Deficiency Injectable System - B12 What should I tell my care team before I take this medication? They need to know if you have any of these conditions: Kidney disease Leber's disease Megaloblastic anemia An unusual or allergic reaction to cyanocobalamin , cobalt, other medications, foods, dyes, or preservatives Pregnant or trying to get pregnant Breast-feeding How should I use this medication? This medication is injected into a muscle or deeply under the skin. It is usually given in a clinic or care team's office. However, your care team may teach you how to inject yourself. Follow all instructions. Talk to your care team about the use of this medication in children. Special care may be needed. Overdosage: If you think you have taken too much of this medicine contact a poison control center or emergency room at once. NOTE: This medicine is only for you. Do not share this medicine with others. What if I miss a dose? If you are given your dose at a clinic or care team's office, call to reschedule your appointment. If you give your own injections, and you miss a dose, take it as soon as you can. If it is almost time for your next dose, take only that dose. Do not take double or extra doses. What may interact with this medication? Alcohol Colchicine This list may not describe all possible  interactions. Give your health care provider a list of all the medicines, herbs, non-prescription drugs, or dietary supplements you use. Also tell them if you smoke, drink alcohol, or use illegal drugs. Some items may interact with your medicine. What should I watch for while using this medication? Visit your care team regularly. You may need blood work done while you are taking this medication. You may need to follow a special diet. Talk to your care team. Limit your alcohol intake and avoid smoking to get the best benefit. What side effects may I notice from receiving this medication? Side effects that you should report to your care team as soon as possible: Allergic reactions--skin rash, itching, hives, swelling of the face, lips, tongue, or throat Swelling of the ankles, hands, or feet Trouble breathing Side effects that usually do not require medical attention (report to your care team if they continue or are bothersome): Diarrhea This list may not describe all possible side effects. Call your doctor for medical advice about side effects. You may report side effects to FDA at 1-800-FDA-1088. Where should I keep my medication? Keep out of the reach of children. Store at room temperature between 15 and 30 degrees C (59 and 85 degrees F). Protect from light. Throw away any unused medication after the expiration date. NOTE: This sheet is a summary. It may not cover all possible information. If you have questions about this medicine, talk to your doctor, pharmacist, or health care provider.  2024 Elsevier/Gold Standard (2020-12-25 00:00:00)

## 2024-03-28 ENCOUNTER — Other Ambulatory Visit: Payer: Self-pay | Admitting: Physician Assistant

## 2024-03-28 DIAGNOSIS — Z79811 Long term (current) use of aromatase inhibitors: Secondary | ICD-10-CM

## 2024-03-28 DIAGNOSIS — C50911 Malignant neoplasm of unspecified site of right female breast: Secondary | ICD-10-CM

## 2024-03-28 DIAGNOSIS — R110 Nausea: Secondary | ICD-10-CM

## 2024-04-03 ENCOUNTER — Other Ambulatory Visit: Payer: Self-pay | Admitting: Cardiology

## 2024-04-08 ENCOUNTER — Encounter (HOSPITAL_COMMUNITY): Payer: Self-pay

## 2024-04-08 ENCOUNTER — Inpatient Hospital Stay (HOSPITAL_COMMUNITY): Admission: RE | Admit: 2024-04-08 | Discharge: 2024-04-08 | Payer: 59 | Attending: Physician Assistant

## 2024-04-08 ENCOUNTER — Inpatient Hospital Stay: Payer: 59 | Attending: Hematology

## 2024-04-08 DIAGNOSIS — Z79899 Other long term (current) drug therapy: Secondary | ICD-10-CM | POA: Diagnosis not present

## 2024-04-08 DIAGNOSIS — Z9221 Personal history of antineoplastic chemotherapy: Secondary | ICD-10-CM | POA: Diagnosis not present

## 2024-04-08 DIAGNOSIS — E538 Deficiency of other specified B group vitamins: Secondary | ICD-10-CM | POA: Insufficient documentation

## 2024-04-08 DIAGNOSIS — M8580 Other specified disorders of bone density and structure, unspecified site: Secondary | ICD-10-CM

## 2024-04-08 DIAGNOSIS — Z853 Personal history of malignant neoplasm of breast: Secondary | ICD-10-CM | POA: Diagnosis not present

## 2024-04-08 DIAGNOSIS — Z79811 Long term (current) use of aromatase inhibitors: Secondary | ICD-10-CM | POA: Diagnosis not present

## 2024-04-08 DIAGNOSIS — Z923 Personal history of irradiation: Secondary | ICD-10-CM | POA: Insufficient documentation

## 2024-04-08 DIAGNOSIS — C50911 Malignant neoplasm of unspecified site of right female breast: Secondary | ICD-10-CM

## 2024-04-08 LAB — CBC WITH DIFFERENTIAL/PLATELET
Abs Immature Granulocytes: 0.06 K/uL (ref 0.00–0.07)
Basophils Absolute: 0.1 K/uL (ref 0.0–0.1)
Basophils Relative: 1 %
Eosinophils Absolute: 0.1 K/uL (ref 0.0–0.5)
Eosinophils Relative: 1 %
HCT: 39.6 % (ref 36.0–46.0)
Hemoglobin: 12.6 g/dL (ref 12.0–15.0)
Immature Granulocytes: 1 %
Lymphocytes Relative: 38 %
Lymphs Abs: 2.8 K/uL (ref 0.7–4.0)
MCH: 30 pg (ref 26.0–34.0)
MCHC: 31.8 g/dL (ref 30.0–36.0)
MCV: 94.3 fL (ref 80.0–100.0)
Monocytes Absolute: 0.7 K/uL (ref 0.1–1.0)
Monocytes Relative: 10 %
Neutro Abs: 3.5 K/uL (ref 1.7–7.7)
Neutrophils Relative %: 49 %
Platelets: 199 K/uL (ref 150–400)
RBC: 4.2 MIL/uL (ref 3.87–5.11)
RDW: 13.9 % (ref 11.5–15.5)
WBC: 7.2 K/uL (ref 4.0–10.5)
nRBC: 0 % (ref 0.0–0.2)

## 2024-04-08 LAB — COMPREHENSIVE METABOLIC PANEL WITH GFR
ALT: 11 U/L (ref 0–44)
AST: 20 U/L (ref 15–41)
Albumin: 4.4 g/dL (ref 3.5–5.0)
Alkaline Phosphatase: 49 U/L (ref 38–126)
Anion gap: 13 (ref 5–15)
BUN: 10 mg/dL (ref 8–23)
CO2: 22 mmol/L (ref 22–32)
Calcium: 9.1 mg/dL (ref 8.9–10.3)
Chloride: 101 mmol/L (ref 98–111)
Creatinine, Ser: 1 mg/dL (ref 0.44–1.00)
GFR, Estimated: 55 mL/min — ABNORMAL LOW (ref >60)
Glucose, Bld: 89 mg/dL (ref 70–99)
Potassium: 3.7 mmol/L (ref 3.5–5.1)
Sodium: 136 mmol/L (ref 135–145)
Total Bilirubin: 0.6 mg/dL (ref 0.0–1.2)
Total Protein: 7.1 g/dL (ref 6.5–8.1)

## 2024-04-08 LAB — VITAMIN D 25 HYDROXY (VIT D DEFICIENCY, FRACTURES): Vit D, 25-Hydroxy: 70.4 ng/mL (ref 30–100)

## 2024-04-08 LAB — VITAMIN B12: Vitamin B-12: 516 pg/mL (ref 180–914)

## 2024-04-12 ENCOUNTER — Ambulatory Visit: Attending: Cardiology

## 2024-04-12 DIAGNOSIS — I4891 Unspecified atrial fibrillation: Secondary | ICD-10-CM

## 2024-04-12 DIAGNOSIS — Z5181 Encounter for therapeutic drug level monitoring: Secondary | ICD-10-CM

## 2024-04-12 LAB — POCT INR: INR: 2.2 (ref 2.0–3.0)

## 2024-04-12 LAB — METHYLMALONIC ACID, SERUM: Methylmalonic Acid, Quantitative: 215 nmol/L (ref 0–378)

## 2024-04-12 NOTE — Patient Instructions (Signed)
 Continue warfarin 1/2 tablet daily except 1 tablet on Mondays and Fridays Keep greens consistent   Recheck in 6 weeks.

## 2024-04-12 NOTE — Progress Notes (Signed)
 INR 2.2; Please see anticoagulation encounter

## 2024-04-14 NOTE — Progress Notes (Unsigned)
 [facility] [address], [place] [address]   CLINIC:  Medical Oncology/Hematology  PCP:  Nemiah Kemps, MD [address] / [place] [address] [phone]   REASON FOR VISIT:  Follow-up for stage Ia left breast cancer (2014) and stage Ia right breast cancer (2017)  BRIEF ONCOLOGIC HISTORY:   Oncology History  Malignant neoplasm of right female breast (HCC)  02/04/2013 Initial Diagnosis   Breast cancer of upper-inner quadrant of left female breast   02/23/2013 Surgery   left lumpectomy (UIQ), sentinel node biopsy--0.7cm, Node negative, ER+, PR+, HER-2/neu over expressed.   04/08/2013 - 04/14/2013 Chemotherapy   Paclitaxel /Herceptin  weekly x 12. Intolerance to Paclitaxel  and only receiving 2 cycles   05/04/2013 - 04/05/2014 Chemotherapy   Herceptin  every 21 days    06/28/2013 -  Chemotherapy   Anastrazole started   08/02/2013 Imaging   Bone density- normal   04/06/2014 Imaging   MUGA- Left ventricular ejection fraction equals 53%.   08/06/2015 Imaging   Bone density- osteopenia   03/06/2016 Procedure   Stereotactic-guided biopsy of right breast upper outer quadrant focal asymmetry/calcifications. No apparent complications.   03/06/2016 Procedure   Successful placement of coil shaped marker within the right breast upper outer quadrant biopsy site, post stereotactic core needle biopsy.   Invasive ductal carcinoma of breast, female, right (HCC)  02/26/2016 Mammogram   1. Focal asymmetry within the upper-outer quadrant of the right breast, with associated new calcifications. This is a suspicious finding for which stereotactic biopsy with 3D tomosynthesis guidance is recommended. 2. Cluster of cysts at the 10 o'clock axis, 5 cm from the nipple, measuring 1.2 x 0.6 x 1.1 cm, a possible correlate for the mammographic asymmetry.   03/06/2016 Procedure   Right needle core biopsy   03/07/2016 Pathology Results   Breast, right, needle core  biopsy, upper outer quadrant - HIGH GRADE DUCTAL CARCINOMA IN SITU WITH NECROSIS AND ASSOCIATED CALCIFICATIONS.IMMUNOHISTOCHEMICAL AND MORPHOMETRIC ANALYSIS PERFORMED MANUALLY Estrogen Receptor: 0%, NEGATIVE Progesterone Receptor: 0%, NEGATIVE   04/02/2016 Procedure   Breast, lumpectomy, right by Dr. Mavis   04/07/2016 Pathology Results   INVASIVE DUCTAL CARCINOMA, GRADE 2, SPANNING 0.6 CM THE CARCINOMA IS FOCALLY PRESENTED AT THE CAUTERIZED MEDIAL MARGIN EXTENSIVE DUCTAL CARCINOMA IN SITU, GRADE 3 ALL OTHER SURGICAL MARGIN ARE NEGATIVE FOR CARCINOMA ER 0% PR 0% Ki-67 15% HER2 NEGATIVE   05/05/2016 Pathology Results   MammaPrint: LOW RISK (97.8% of low risk Mammaprint patients who were treated with anti-hormonal therapy alone are living without distant recurrence of breast cancer at 5-years).   06/02/2016 - 07/04/2016 Radiation Therapy   XRT in [place], [place].  Right breast 42.56 Gy delivered in 16 fractions at 2.65 Gy per fraction and a right tumor bed boost 16 Gy delivered in 8 fractions at 2 Gy per fraction for a total dose of 58.56 Gy completed on 07/04/2016.     CANCER STAGING:  Cancer Staging  Invasive ductal carcinoma of breast, female, right [facility]) Staging form: Breast, AJCC 8th Edition - Pathologic stage from 05/08/2016: Stage IB (pT1b, pN0, cM0, G2, ER-, PR-, HER2-) - Signed by Berry Debby RAMAN, PA-C on 05/08/2016  Malignant neoplasm of right female breast [facility]) Staging form: Breast, AJCC 7th Edition - Clinical stage from 03/16/2013: Stage IA (T1b, N0, cM0, Free text: stage I) - Unsigned - Pathologic: Stage IA (T1b, N0, cM0) - Signed by Lawernce Hacker, MD on 03/17/2013   INTERVAL HISTORY:   Stacie Huang, a [phone]. female, returns  for routine follow-up of her left and right-sided breast cancers.  Stacie Huang was last seen on 04/13/2023 by Pleasant Barefoot PA-C.  In the interim since last visit, she had ***. She denies any ***other*** recent surgeries, hospitalizations, or  changes in baseline health status.  *** At today's visit, she  reports feeling ***.  She reports ***% energy and ***% appetite.   ***She is maintaining stable weight at this time.  ***No new onset breast lumps or axillary lymphadenopathy. ***Denies any new aches or pains, headaches, or abdominal pain. ***She completed anastrozole  as of 07/28/2023.  ***She takes calcium  and vitamin D  daily. ***She is receiving Prolia  injections every 6 months, tolerating these well. ***No new bone pain, fractures, jaw pain, dental issues.  She reports regular dental follow-up.  ***   ASSESSMENT & PLAN:  1.  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 She was treated with left lumpectomy followed by Taxol  and Herceptin  x12 weekly cycles.  Taxol  discontinued after 2 cycles due to toxicities.  Herceptin  was continued for 52 weeks. Anastrozole  started in March 2015.  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 Status post right lumpectomy revealing TRIPLE NEGATIVE breast cancer. MammaPrint showed low risk disease. No adjuvant chemotherapy was recommended.  Adjuvant XRT completed on 07/04/2016.  - She completed 10 years of anastrozole  from March 2015 through March 2025. - Physical examination (***) showed right breast lumpectomy scar in the upper outer quadrant unchanged. No palpable masses or adenopathy. - Mammogram on 04/08/2024 was BI-RADS Category 1, negative  - Labs (04/08/2024): Normal CBC.  LFTs normal.  Baseline CKD stage IIIa. - PLAN: She has completed treatment of breast cancer after 10 years of anastrozole . - At this time, she is stable for discharge to PCP for annual mammogram and breast exams, which should be performed next in December 2026.  ***   2.   Osteopenia: - DEXA bone density scan (03/02/2018) with  T-score -1.5 - Most recent bone density scan (04/04/2022): T-score -1.2 - Patient is taking calcium  and vitamin D *** - Completed 10 years of anastrozole  from 2015 through 2025 - She has been receiving Prolia  every 6 months since June 2017.  Last dose was on 10/12/2023. - Most recent labs (04/08/2024) show normal calcium  9.1 with normal vitamin D  at 70.40 - PLAN: Since she has completed aromatase inhibitor, we have discontinued Prolia .  Her last dose was on 10/12/2023. - Recommend single dose zoledronic acid 5 mg (given 6 months after last dose of Prolia ) to prevent rebound bone loss.  Will give zoledronic acid (Reclast) today.  *** - Continue calcium , vitamin D , and weightbearing exercises as tolerated. - Otherwise, stable for discharge to PCP for ongoing monitoring and management of osteopenia.  3.  Vitamin B12 deficiency: - History of severe vitamin B12 deficiency (Vitamin B12 was <50 in November 2021). - Patient is receiving monthly B12 injections.*** - Most recent labs (04/08/2024): Vitamin B12 normal at 516, MMA normal - PLAN: Recommend the patient continue monthly B12 injections, we will coordinate with PCP to ensure that these can be given at their office.  ***Stacie Huang confirmed w/ PCP Darlyn Hurst MD office that she can get injections there***   PLAN SUMMARY: *** TBD *** >> Proceed with Reclast + B12 injection today  >> Discharge to PCP with the following recommendations:  Monthly B12 injections to be done at  PCP office Annual breast exam and mammogram via PCP, next due December 2026    REVIEW OF SYSTEMS: No acute complaints***  Review of Systems  Constitutional:  Negative for appetite change, chills, diaphoresis, fatigue, fever and unexpected weight change.  HENT:   Negative for lump/mass and nosebleeds.   Eyes:  Negative for eye problems.  Respiratory:  Negative for cough, hemoptysis and shortness of breath.   Cardiovascular:  Negative for chest pain, leg swelling and palpitations.   Gastrointestinal:  Negative for abdominal pain, blood in stool, constipation, diarrhea, nausea and vomiting.  Genitourinary:  Negative for hematuria.   Skin: Negative.   Neurological:  Negative for dizziness, headaches and light-headedness.  Hematological:  Does not bruise/bleed easily.    PHYSICAL EXAM: ***  Performance status (ECOG): 0 - Asymptomatic  There were no vitals filed for this visit.  Wt Readings from Last 3 Encounters:  11/03/23 126 lb (57.2 kg)  04/13/23 131 lb 9.6 oz (59.7 kg)  02/02/23 134 lb (60.8 kg)   Physical Exam Constitutional:      Appearance: Normal appearance.  HENT:     Head: Normocephalic and atraumatic.     Mouth/Throat:     Mouth: Mucous membranes are moist.  Eyes:     Extraocular Movements: Extraocular movements intact.     Pupils: Pupils are equal, round, and reactive to light.  Cardiovascular:     Rate and Rhythm: Normal rate. Rhythm irregular.     Pulses: Normal pulses.     Heart sounds: Normal heart sounds.  Pulmonary:     Effort: Pulmonary effort is normal.     Breath sounds: Normal breath sounds.  Chest:    Abdominal:     General: Bowel sounds are normal.     Palpations: Abdomen is soft.     Tenderness: There is no abdominal tenderness.  Musculoskeletal:        General: No swelling.     Right lower leg: No edema.     Left lower leg: No edema.  Lymphadenopathy:     Cervical: No cervical adenopathy.  Skin:    General: Skin is warm and dry.  Neurological:     General: No focal deficit present.     Mental Status: She is alert and oriented to person, place, and time.  Psychiatric:        Mood and Affect: Mood normal.        Behavior: Behavior normal.      PAST MEDICAL/SURGICAL HISTORY:  Past Medical History:  Diagnosis Date   Anxiety    Breast cancer (HCC)    Breast cancer (HCC) 2014   left breast   Cancer (HCC)    Chronic anticoagulation    GERD (gastroesophageal reflux disease)    Hyperlipidemia    Hypertension     Invasive ductal carcinoma of breast, female, right (HCC) 05/08/2016   Osteopenia determined by x-ray 10/03/2015   Paroxysmal atrial fibrillation (HCC)    Personal history of chemotherapy 2014   Past Surgical History:  Procedure Laterality Date   ABDOMINAL HYSTERECTOMY     AXILLARY LYMPH NODE DISSECTION Left 02/23/2013   Procedure: LEFT AXILLARY LYMPH NODE DISSECTION;  Surgeon: Elsie GORMAN Holland, MD;  Location: [facility];  Service: General;  Laterality: Left;   CHOLECYSTECTOMY     COLONOSCOPY  03/10/2002   RMR: Incomplete colonoscopy (sigmoidoscopy)/Normal rectum/Normal colon to 40 cm.    ESOPHAGOGASTRODUODENOSCOPY  03/10/2002   MFM:Wnmfjo esophagus/small HH/Tiny AVM in antrum, otherwise normal stomach and D1 and D2/Status  post passage of the [facility] dilator   PARTIAL MASTECTOMY WITH AXILLARY SENTINEL LYMPH NODE BIOPSY Left 02/23/2013   Procedure: LEFT PARTIAL MASTECTOMY WITH AXILLARY SIMPLE NODE BIOPSY, LEFT BREAST WIDE EXCISION;  Surgeon: Elsie GORMAN Holland, MD;  Location: [facility];  Service: General;  Laterality: Left;   PARTIAL MASTECTOMY WITH NEEDLE LOCALIZATION Right 04/02/2016   Procedure: RIGHT PARTIAL MASTECTOMY AFTER NEEDLE LOCALIZATION;  Surgeon: Oneil Budge, MD;  Location: [facility];  Service: General;  Laterality: Right;   PORT-A-CATH REMOVAL Right 07/31/2017   Procedure: MINOR REMOVAL PORT-A-CATH;  Surgeon: Budge Oneil, MD;  Location: [facility];  Service: General;  Laterality: Right;   PORTACATH PLACEMENT Right 04/07/2013   PORTACATH PLACEMENT Right 04/07/2013   Procedure: INSERTION PORT-A-CATH;  Surgeon: Elsie GORMAN Holland, MD;  Location: [facility];  Service: General;  Laterality: Right;    SOCIAL HISTORY:  Social History   Socioeconomic History   Marital status: Legally Separated    Spouse name: Not on file   Number of children:  2   Years of education: Not on file   Highest education level: Not on file  Occupational History   Occupation: Retired from designer, 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  Tobacco Use   Smoking status: Never   Smokeless tobacco: Never  Vaping Use   Vaping status: Never Used  Substance and Sexual Activity   Alcohol use: No   Drug use: No   Sexual activity: Yes    Birth control/protection: Surgical  Other Topics Concern   Not on file  Social History Narrative   2 adult children   Social Drivers of Health   Tobacco Use: Low Risk (01/14/2024)   Patient History    Smoking Tobacco Use: Never    Smokeless Tobacco Use: Never    Passive Exposure: Not on file  Financial Resource Strain: Not on file  Food Insecurity: Not on file  Transportation Needs: Not on file  Physical Activity: Not on file  Stress: Not on file  Social Connections: Not on file  Intimate Partner Violence: Not on file  Depression (PHQ2-9): Low Risk (01/14/2024)   Depression (PHQ2-9)    PHQ-2 Score: 0  Alcohol Screen: Not on file  Housing: Not on file  Utilities: Not on file  Health Literacy: Not on file    FAMILY HISTORY:  Family History  Problem Relation Age of Onset   Heart disease Mother        Also 5 of 9 siblings   Heart attack Father    Leukemia Other    Leukemia Other    Colon cancer Neg Hx     CURRENT MEDICATIONS:  Current Outpatient Medications  Medication Sig Dispense Refill   atenolol  (TENORMIN ) 100 MG tablet TAKE ONE TABLET BY MOUTH ONCE DAILY FOR HIGH BLOOD prESSURE 30 tablet 6   calcium  carbonate (OS-CAL) 600 MG TABS tablet Take 600 mg by mouth 2 (two) times daily with a meal.      cetirizine (ZYRTEC) 10 MG tablet Take 10 mg by mouth every morning.     Cholecalciferol  (VITAMIN D ) 400 UNITS capsule Take 400 Units by mouth 2 (two) times daily.     Cyanocobalamin  2000 MCG/ML SOLN Inject 1 mL as directed. Patient injects once a month.  Is not sure of dose.     denosumab  (PROLIA ) 60 MG/ML SOLN injection Inject 60 mg into the skin every 6 (six) months. Administer in upper arm, thigh, or abdomen     diltiazem  (CARDIZEM  CD) 120 MG 24 hr capsule TAKE (1)  CAPSULE BY MOUTH ONCE A DAY. 90 capsule 1   fluticasone  (FLONASE ) 50 MCG/ACT nasal spray Place 2 sprays into both nostrils at bedtime.     LORazepam  (ATIVAN ) 1 MG tablet Take 1 mg by mouth at bedtime.     metoCLOPramide  (REGLAN ) 10 MG tablet TAKE ONE TABLET BY MOUTH EVERY 8 HOURS AS NEEDED FOR NAUSEA 120 tablet 0   pantoprazole  (PROTONIX ) 40 MG tablet TAKE (1) TABLET BY MOUTH TWICE DAILY. 60 tablet 5   psyllium (METAMUCIL) 58.6 % powder Take 1 packet by mouth daily.      simvastatin  (ZOCOR ) 10 MG tablet Take 1 tablet (10 mg total) by mouth daily at 6 PM. 100 tablet 3   warfarin (COUMADIN ) 2.5 MG tablet TAKE (1/2) TABLET BY MOUTH DAILY EXCEPT 1 TABLET ON MONDAYS AND FRIDAYS. 30 tablet 5   No current facility-administered medications for this visit.    ALLERGIES:  Allergies  Allergen Reactions   Iohexol Hives   Sulfa Antibiotics Rash   Sulfasalazine Rash    LABORATORY DATA:  I have reviewed the labs as listed.     Latest Ref Rng & Units 04/08/2024    1:05 PM 04/06/2023    1:05 PM 03/24/2022   11:43 AM  CBC  WBC 4.0 - 10.5 K/uL 7.2  7.5  8.1   Hemoglobin 12.0 - 15.0 g/dL 87.3  86.7  87.3   Hematocrit 36.0 - 46.0 % 39.6  42.6  39.7   Platelets 150 - 400 K/uL 199  220  332       Latest Ref Rng & Units 04/08/2024    1:05 PM 10/12/2023   12:30 PM 04/06/2023    1:05 PM  CMP  Glucose 70 - 99 mg/dL 89  897  98   BUN 8 - 23 mg/dL 10  9  15    Creatinine 0.44 - 1.00 mg/dL 8.99  8.96  8.83   Sodium 135 - 145 mmol/L 136  139  139   Potassium 3.5 - 5.1 mmol/L 3.7  4.3  3.7   Chloride 98 - 111 mmol/L 101  106  105   CO2 22 - 32 mmol/L 22  23  26    Calcium  8.9 - 10.3 mg/dL 9.1  9.2  9.9   Total Protein 6.5 - 8.1 g/dL 7.1  6.5  7.1   Total Bilirubin 0.0 - 1.2 mg/dL 0.6  0.7  0.8   Alkaline Phos 38 - 126 U/L 49  40  50   AST 15 - 41 U/L 20  17  18    ALT 0 - 44 U/L 11  11  14      DIAGNOSTIC IMAGING:  I have independently reviewed the scans and discussed with the patient. MM 3D SCREEN  BREAST BILATERAL Result Date: 04/14/2024 CLINICAL DATA:  Screening. EXAM: DIGITAL SCREENING BILATERAL MAMMOGRAM WITH TOMOSYNTHESIS AND CAD TECHNIQUE: Bilateral screening digital craniocaudal and mediolateral oblique mammograms were obtained. Bilateral screening digital breast tomosynthesis was performed. The images were evaluated with computer-aided detection. COMPARISON:  Previous exam(s). ACR Breast Density Category b: There are scattered areas of fibroglandular density. FINDINGS: There are no findings suspicious for malignancy. IMPRESSION: No mammographic evidence of malignancy. A result letter of this screening mammogram will be mailed directly to the patient. RECOMMENDATION: Screening mammogram in one year. (Code:SM-B-01Y) BI-RADS CATEGORY  1: Negative. Electronically Signed   By: Dina  Arceo M.D.   On: 04/14/2024 11:23     WRAP UP:  All questions were answered. The patient knows to  call the clinic with any problems, questions or concerns.  Medical decision making: Moderate  Time spent on visit: I spent 20 minutes counseling the patient face to face. The total time spent in the appointment was 30 minutes and more than 50% was on counseling.  Pleasant CHRISTELLA Barefoot, PA-C  ***

## 2024-04-18 ENCOUNTER — Inpatient Hospital Stay

## 2024-04-18 ENCOUNTER — Inpatient Hospital Stay: Payer: 59 | Admitting: Physician Assistant

## 2024-04-18 ENCOUNTER — Inpatient Hospital Stay: Payer: 59

## 2024-04-18 VITALS — BP 121/57 | HR 67 | Temp 98.2°F | Resp 19 | Wt 131.3 lb

## 2024-04-18 VITALS — BP 130/52 | HR 57 | Resp 19

## 2024-04-18 DIAGNOSIS — E538 Deficiency of other specified B group vitamins: Secondary | ICD-10-CM

## 2024-04-18 DIAGNOSIS — Z170 Estrogen receptor positive status [ER+]: Secondary | ICD-10-CM | POA: Diagnosis not present

## 2024-04-18 DIAGNOSIS — M8580 Other specified disorders of bone density and structure, unspecified site: Secondary | ICD-10-CM

## 2024-04-18 DIAGNOSIS — C50911 Malignant neoplasm of unspecified site of right female breast: Secondary | ICD-10-CM

## 2024-04-18 DIAGNOSIS — Z79899 Other long term (current) drug therapy: Secondary | ICD-10-CM

## 2024-04-18 MED ORDER — CYANOCOBALAMIN 1000 MCG/ML IJ SOLN
1000.0000 ug | Freq: Once | INTRAMUSCULAR | Status: AC
  Administered 2024-04-18: 1000 ug via INTRAMUSCULAR
  Filled 2024-04-18: qty 1

## 2024-04-18 MED ORDER — SODIUM CHLORIDE 0.9 % IV SOLN: INTRAVENOUS | Status: DC

## 2024-04-18 MED ORDER — ZOLEDRONIC ACID 5 MG/100ML IV SOLN
5.0000 mg | Freq: Once | INTRAVENOUS | Status: AC
  Administered 2024-04-18: 5 mg via INTRAVENOUS
  Filled 2024-04-18: qty 100

## 2024-04-18 NOTE — Progress Notes (Signed)
 Patient tolerated B12 injection with no complaints voiced.  Site clean and dry with no bruising or swelling noted at site.  See MAR for details.  Band aid applied.  Patient stable during and after injection.  Vss with discharge and left in satisfactory condition with no s/s of distress noted. All follow ups as scheduled.   Stacie Huang

## 2024-04-18 NOTE — Patient Instructions (Signed)

## 2024-04-18 NOTE — Progress Notes (Signed)
 Patient tolerated therapy with no complaints voiced. Labs reviewed. Side effects with management reviewed with understanding verbalized. Peripheral IV site clean and dry with no bruising or swelling noted at site. Good blood return noted before and after administration of therapy. Band aid applied. Patient left in satisfactory condition with VSS and no s/s of distress noted.    [name] [employer]

## 2024-04-18 NOTE — Patient Instructions (Signed)
 [facility] **VISIT SUMMARY & IMPORTANT INSTRUCTIONS **   You were seen today by Pleasant Barefoot PA-C for your history of breast cancer.    HISTORY OF BREAST CANCER Your most recent mammogram, labs, and breast exam did not show any signs of breast cancer. You have completed treatment of breast cancer, after you finish taking your Arimidex  in April 2025. At this time, you can graduate from being seen at the [facility].  You do not need any further appointments at our office. You should continue to follow-up with your primary care provider for breast exam and mammogram done once a year, next due in December 2026. If you have any issues in the future, we we will always be happy to provide care for you - but we sincerely hope that you will continue to remain healthy and will not need any additional cancer care down the road.  OSTEOPENIA Your breast cancer medication (anastrozole ) caused some weakness in your bones. This weakness in your bones was treated with Prolia  injections. Since you are done with your breast cancer treatment, you can also stop your Prolia  injections. We will give one last treatment today with a different medication called Reclast  (zoledronic  acid), which will help to prevent any further bone loss.  B12 DEFICIENCY: You should continue to receive vitamin B12 injections once a month.  This can be done at your primary care office, with next dose to be given around May 19, 2024.   - - - - - - - - - - - - - - - - - -    Thank you for choosing [facility] at [facility] to provide your oncology and hematology care.  To afford each patient quality time with our provider, please arrive at least 15 minutes before your scheduled appointment time.   If you have a lab appointment with the Cancer Center please come in thru the Main Entrance and check in at the main information desk.  You need to re-schedule your  appointment should you arrive 10 or more minutes late.  We strive to give you quality time with our providers, and arriving late affects you and other patients whose appointments are after yours.  Also, if you no show three or more times for appointments you may be dismissed from the clinic at the providers discretion.     Again, thank you for choosing [facility].  Our hope is that these requests will decrease the amount of time that you wait before being seen by our physicians.       _____________________________________________________________  Should you have questions after your visit to [facility], please contact our office at [phone] and follow the prompts.  Our office hours are 8:00 a.m. and 4:30 p.m. Monday - Friday.  Please note that voicemails left after 4:00 p.m. may not be returned until the following business day.  We are closed weekends and major holidays.  You do have access to a nurse 24-7, just call the main number to the clinic [phone] and do not press any options, hold on the line and a nurse will answer the phone.    For prescription refill requests, have your pharmacy contact our office and allow 72 hours.

## 2024-05-02 ENCOUNTER — Other Ambulatory Visit: Payer: Self-pay | Admitting: Nurse Practitioner

## 2024-05-04 MED ORDER — DILTIAZEM HCL ER COATED BEADS 120 MG PO CP24: ORAL_CAPSULE | ORAL | 0 refills | Status: DC

## 2024-05-10 ENCOUNTER — Encounter: Payer: Self-pay | Admitting: Cardiology

## 2024-05-10 ENCOUNTER — Ambulatory Visit: Attending: Cardiology | Admitting: Cardiology

## 2024-05-10 VITALS — BP 124/68 | HR 68 | Ht 61.0 in | Wt 131.4 lb

## 2024-05-10 DIAGNOSIS — E782 Mixed hyperlipidemia: Secondary | ICD-10-CM | POA: Diagnosis not present

## 2024-05-10 DIAGNOSIS — I4891 Unspecified atrial fibrillation: Secondary | ICD-10-CM

## 2024-05-10 DIAGNOSIS — D6869 Other thrombophilia: Secondary | ICD-10-CM

## 2024-05-10 DIAGNOSIS — I10 Essential (primary) hypertension: Secondary | ICD-10-CM

## 2024-05-10 NOTE — Patient Instructions (Signed)
 Medication Instructions:  Continue all current medications.   Labwork: none  Testing/Procedures: none  Follow-Up: 6 months   Any Other Special Instructions Will Be Listed Below (If Applicable).   If you need a refill on your cardiac medications before your next appointment, please call your pharmacy.

## 2024-05-10 NOTE — Progress Notes (Signed)
 "     Clinical Summary [name] is a 84 y.o.female seen today for follow up of the following medical problems    1. Afib  - not interested in NOACs, remains on coumadin .   - no recent palpitatios - compliant with meds, no bleeding on couamdin      2. Chest pain - started few months ago. Sharp pain 6/10 in severity. Occurs at rest, lasts 15-20 minutes. No other associated symptoms. Not positional. Occurs 2 times week. No relation to eating - no exertional symptoms, does pretty heavy housework.   - no recent chest pains.        3. HTN - she is compliant with meds   3. Hyperlipidemia - labs followed by pcp - 08/2023 TC 128 TG 128 HDL 52 LDL 54   4. 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      cetirizine (ZYRTEC) 10 MG tablet Take 10 mg by mouth every morning.     Cholecalciferol  (VITAMIN D ) 400 UNITS capsule Take 400 Units by mouth 2 (two) times daily.     Cyanocobalamin  2000 MCG/ML SOLN Inject 1 mL as directed. Patient injects once a month.  Is not sure of dose.     denosumab  (PROLIA ) 60 MG/ML SOLN injection Inject 60 mg into the skin every 6 (six) months. Administer in upper arm, thigh, or abdomen     diltiazem  (CARDIZEM  CD) 120 MG 24 hr capsule TAKE (1)  CAPSULE BY MOUTH ONCE A DAY. 30 capsule 0   fluticasone  (FLONASE ) 50 MCG/ACT nasal spray Place 2 sprays into both nostrils at bedtime.     LORazepam  (ATIVAN ) 1 MG tablet Take 1 mg by mouth at bedtime.     metoCLOPramide  (REGLAN ) 10 MG tablet TAKE ONE TABLET BY MOUTH EVERY 8 HOURS AS NEEDED FOR NAUSEA 120 tablet 0   pantoprazole  (PROTONIX ) 40 MG tablet TAKE (1) TABLET BY MOUTH TWICE DAILY. 60 tablet 5   psyllium (METAMUCIL) 58.6 % powder Take 1 packet by mouth daily.      simvastatin  (ZOCOR ) 10 MG tablet Take 1 tablet (10 mg total) by mouth daily at 6 PM. 100 tablet 3   warfarin (COUMADIN ) 2.5 MG tablet TAKE (1/2) TABLET BY MOUTH DAILY EXCEPT 1 TABLET ON MONDAYS AND FRIDAYS. 30 tablet 5   No current facility-administered medications for this visit.     Past Surgical History:  Procedure Laterality Date   ABDOMINAL HYSTERECTOMY     AXILLARY LYMPH NODE DISSECTION Left 02/23/2013   Procedure: LEFT AXILLARY LYMPH NODE DISSECTION;  Surgeon: Elsie GORMAN Holland, MD;  Location: [facility];  Service: General;  Laterality: Left;   CHOLECYSTECTOMY     COLONOSCOPY  03/10/2002  RMR: Incomplete colonoscopy (sigmoidoscopy)/Normal rectum/Normal colon to 40 cm.    ESOPHAGOGASTRODUODENOSCOPY  03/10/2002   MFM:Wnmfjo esophagus/small HH/Tiny AVM in antrum, otherwise normal stomach and D1 and D2/Status post passage of the [facility] dilator   PARTIAL MASTECTOMY WITH AXILLARY SENTINEL LYMPH NODE BIOPSY Left 02/23/2013   Procedure: LEFT PARTIAL MASTECTOMY WITH AXILLARY SIMPLE NODE BIOPSY, LEFT BREAST WIDE EXCISION;  Surgeon: Elsie GORMAN Holland, MD;  Location: [facility];  Service: General;  Laterality: Left;   PARTIAL MASTECTOMY WITH NEEDLE LOCALIZATION Right 04/02/2016   Procedure: RIGHT PARTIAL MASTECTOMY AFTER NEEDLE LOCALIZATION;  Surgeon: Oneil Budge, MD;  Location: [facility];  Service: General;  Laterality: Right;   PORT-A-CATH REMOVAL Right 07/31/2017   Procedure: MINOR REMOVAL PORT-A-CATH;  Surgeon: Budge Oneil,  MD;  Location: [facility];  Service: General;  Laterality: Right;   PORTACATH PLACEMENT Right 04/07/2013   PORTACATH PLACEMENT Right 04/07/2013   Procedure: INSERTION PORT-A-CATH;  Surgeon: Elsie GORMAN Holland, MD;  Location: [facility];  Service: General;  Laterality: Right;     Allergies[2]    Family History  Problem Relation Age of Onset   Heart disease Mother        Also 5 of 9 siblings   Heart attack Father    Leukemia Other    Leukemia Other    Colon cancer Neg Hx      Social History Ms. Crumrine reports that she has never smoked. She has never used smokeless tobacco. Ms. Hausler reports no history of alcohol use.    Physical Examination Today's Vitals   05/10/24 1354  BP: 124/68  Pulse: 68  SpO2: 96%  Weight: 131 lb 6.4 oz (59.6 kg)  Height: 5' 1 (1.549 m)   Body mass index is 24.83 kg/m.  Gen: resting comfortably, no acute distress HEENT: no scleral icterus, pupils equal round and reactive, no palptable cervical adenopathy,  CV: irreg, no mrg, no jvd Resp: Clear to auscultation bilaterally GI: abdomen is soft, non-tender, non-distended, normal bowel sounds, no hepatosplenomegaly MSK: extremities are warm, no edema.  Skin: warm, no rash Neuro:  no focal deficits Psych: appropriate affect     Assessment and Plan   1. Afib/acquired thrombophilia - has not been interested in NOACs, continue coumadin  - no symptoms, continue currentt meds   2. Chest pain - no recurrent symptoms, prior symptoms were noncardiac in description - continue to monitor   3. HTN -bp at goal, continue current meds   4. Hyperlipidemia -lipids are at goal, continue current therapy  F/u 6 months     Dorn PHEBE Ross, M.D.,     [1]  Allergies Allergen Reactions   Iohexol Hives   Sulfa Antibiotics Rash   Sulfasalazine Rash  [2]  Allergies Allergen Reactions   Iohexol Hives   Sulfa Antibiotics Rash   Sulfasalazine Rash   "

## 2024-05-25 ENCOUNTER — Ambulatory Visit

## 2024-05-30 ENCOUNTER — Ambulatory Visit

## 2024-05-30 ENCOUNTER — Other Ambulatory Visit: Payer: Self-pay | Admitting: Cardiology

## 2024-06-06 ENCOUNTER — Ambulatory Visit
